# Patient Record
Sex: Female | Born: 1985 | Race: Black or African American | Hispanic: No | Marital: Single
Health system: Southern US, Community
[De-identification: ages and names within clinical notes are randomized; demographics above are authoritative.]

## PROBLEM LIST (undated history)

## (undated) ENCOUNTER — Inpatient Hospital Stay (HOSPITAL_COMMUNITY): Payer: Self-pay

## (undated) ENCOUNTER — Ambulatory Visit

## (undated) ENCOUNTER — Ambulatory Visit: Admission: EM | Payer: Medicare Other | Source: Home / Self Care

## (undated) DIAGNOSIS — F419 Anxiety disorder, unspecified: Secondary | ICD-10-CM

## (undated) DIAGNOSIS — F1020 Alcohol dependence, uncomplicated: Secondary | ICD-10-CM

## (undated) DIAGNOSIS — G8929 Other chronic pain: Secondary | ICD-10-CM

## (undated) DIAGNOSIS — F1910 Other psychoactive substance abuse, uncomplicated: Secondary | ICD-10-CM

## (undated) DIAGNOSIS — D649 Anemia, unspecified: Secondary | ICD-10-CM

## (undated) DIAGNOSIS — Z5189 Encounter for other specified aftercare: Secondary | ICD-10-CM

## (undated) DIAGNOSIS — F319 Bipolar disorder, unspecified: Secondary | ICD-10-CM

## (undated) DIAGNOSIS — F32A Depression, unspecified: Secondary | ICD-10-CM

## (undated) DIAGNOSIS — R519 Headache, unspecified: Secondary | ICD-10-CM

## (undated) DIAGNOSIS — F329 Major depressive disorder, single episode, unspecified: Secondary | ICD-10-CM

## (undated) DIAGNOSIS — J45909 Unspecified asthma, uncomplicated: Secondary | ICD-10-CM

## (undated) HISTORY — DX: Other chronic pain: G89.29

## (undated) HISTORY — PX: HAND SURGERY: SHX662

## (undated) HISTORY — DX: Unspecified asthma, uncomplicated: J45.909

## (undated) HISTORY — PX: COLONOSCOPY: SHX174

## (undated) HISTORY — DX: Headache, unspecified: R51.9

## (undated) HISTORY — DX: Encounter for other specified aftercare: Z51.89

## (undated) HISTORY — DX: Depression, unspecified: F32.A

## (undated) HISTORY — DX: Other psychoactive substance abuse, uncomplicated: F19.10

## (undated) HISTORY — DX: Alcohol dependence, uncomplicated: F10.20

## (undated) HISTORY — DX: Anemia, unspecified: D64.9

## (undated) HISTORY — PX: DILATION AND CURETTAGE OF UTERUS: SHX78

## (undated) HISTORY — PX: WISDOM TOOTH EXTRACTION: SHX21

---

## 2019-10-19 ENCOUNTER — Other Ambulatory Visit: Payer: Self-pay

## 2019-10-19 ENCOUNTER — Encounter: Payer: Self-pay | Admitting: Family Medicine

## 2019-10-19 ENCOUNTER — Ambulatory Visit
Admission: EM | Admit: 2019-10-19 | Discharge: 2019-10-19 | Disposition: A | Discharge status: Home / Self Care | Payer: 59 | Attending: Family Medicine | Admitting: Family Medicine

## 2019-10-19 DIAGNOSIS — Z3202 Encounter for pregnancy test, result negative: Secondary | ICD-10-CM | POA: Diagnosis not present

## 2019-10-19 DIAGNOSIS — R8281 Pyuria: Secondary | ICD-10-CM

## 2019-10-19 DIAGNOSIS — B9689 Other specified bacterial agents as the cause of diseases classified elsewhere: Secondary | ICD-10-CM

## 2019-10-19 DIAGNOSIS — N760 Acute vaginitis: Secondary | ICD-10-CM | POA: Insufficient documentation

## 2019-10-19 HISTORY — DX: Anxiety disorder, unspecified: F41.9

## 2019-10-19 HISTORY — DX: Bipolar disorder, unspecified: F31.9

## 2019-10-19 HISTORY — DX: Major depressive disorder, single episode, unspecified: F32.9

## 2019-10-19 LAB — POCT URINALYSIS DIP (MANUAL ENTRY)
Bilirubin, UA: NEGATIVE
Glucose, UA: NEGATIVE mg/dL
Ketones, POC UA: NEGATIVE mg/dL
Nitrite, UA: NEGATIVE
Spec Grav, UA: 1.02 (ref 1.010–1.025)
Urobilinogen, UA: 0.2 E.U./dL
pH, UA: 7 (ref 5.0–8.0)

## 2019-10-19 LAB — POCT URINE PREGNANCY: Preg Test, Ur: NEGATIVE

## 2019-10-19 MED ORDER — AZITHROMYCIN 500 MG PO TABS
1000.0000 mg | ORAL_TABLET | Freq: Once | ORAL | Status: AC
  Administered 2019-10-19: 1000 mg via ORAL

## 2019-10-19 MED ORDER — CEPHALEXIN 500 MG PO CAPS: 500.0000 mg | ORAL_CAPSULE | Freq: Four times a day (QID) | ORAL | 0 refills | Status: DC

## 2019-10-19 MED ORDER — FLUCONAZOLE 150 MG PO TABS: 150.0000 mg | ORAL_TABLET | Freq: Once | ORAL | 0 refills | Status: AC

## 2019-10-19 NOTE — ED Provider Notes (Signed)
[name]    CSN: [id number] Arrival date & time: 10/19/19  1439      History   Chief Complaint Chief Complaint  Patient presents with  . Vaginal Itching    HPI Kimberly Harper is a 34 y.o. female.   34 yo woman making initial [facility] visit.  She is complaining of vaginal irritation.  Pt presents to [facility] for assessment of redness, itching and profuse watery discharge since Thursday.    Patient states unsure of risk for STDs.  Patient states after intercourse a few weeks ago she began to spot, and spotted for almost a week.  This did not occur at the time of her normal period.    No fever, abdominal pain, vaginal odor.     Past Medical History:  Diagnosis Date  . Anxiety   . Bipolar 1 disorder ([place])   . Major depressive disorder     There are no problems to display for this patient.   Past Surgical History:  Procedure Laterality Date  . CESAREAN SECTION    . DILATION AND CURETTAGE OF UTERUS      OB History   No obstetric history on file.      Home Medications    Prior to Admission medications   Medication Sig Start Date End Date Taking? Authorizing Provider  cephALEXin (KEFLEX) 500 MG capsule Take 1 capsule (500 mg total) by mouth 4 (four) times daily. 10/19/19   Robyn Haber, MD  fluconazole (DIFLUCAN) 150 MG tablet Take 1 tablet (150 mg total) by mouth once for 1 dose. Repeat if needed 10/19/19 10/19/19  Robyn Haber, MD    Family History Family History  Problem Relation Age of Onset  . Hypertension Mother   . Diabetes Mother   . Stroke Mother     Social History Social History   Tobacco Use  . Smoking status: Never Smoker  . Smokeless tobacco: Never Used  Substance Use Topics  . Alcohol use: Yes    Comment: socially  . Drug use: Never     Allergies   Patient has no allergy information on record.   Review of Systems Review of Systems  Genitourinary: Positive for menstrual problem and vaginal pain.  All other  systems reviewed and are negative.    Physical Exam Triage Vital Signs ED Triage Vitals  Enc Vitals Group     BP      Pulse      Resp      Temp      Temp src      SpO2      Weight      Height      Head Circumference      Peak Flow      Pain Score      Pain Loc      Pain Edu?      Excl. in [place]?    No data found.  Updated Vital Signs BP 110/70 (BP Location: Left Arm)   Pulse 71   Temp 97.8 F (36.6 C) (Temporal)   Resp 16   LMP 10/06/2019   SpO2 95%    Physical Exam Vitals and nursing note reviewed.  Constitutional:      General: She is not in acute distress.    Appearance: Normal appearance. She is normal weight. She is not ill-appearing or toxic-appearing.  HENT:     Head: Normocephalic.  Eyes:     Conjunctiva/sclera: Conjunctivae normal.  Cardiovascular:     Rate  and Rhythm: Normal rate.  Pulmonary:     Effort: Pulmonary effort is normal.  Abdominal:     Palpations: Abdomen is soft.     Tenderness: There is no abdominal tenderness.  Musculoskeletal:        General: Normal range of motion.     Cervical back: Normal range of motion.  Skin:    General: Skin is warm and dry.  Neurological:     General: No focal deficit present.     Mental Status: She is alert and oriented to person, place, and time.  Psychiatric:        Mood and Affect: Mood normal.        Behavior: Behavior normal.        Thought Content: Thought content normal.        Judgment: Judgment normal.      UC Treatments / Results  Labs (all labs ordered are listed, but only abnormal results are displayed) Labs Reviewed  POCT URINALYSIS DIP (MANUAL ENTRY) - Abnormal; Notable for the following components:      Result Value   Blood, UA trace-intact (*)    Protein Ur, POC trace (*)    Leukocytes, UA Large (3+) (*)    All other components within normal limits  POCT URINE PREGNANCY - Normal  URINE CULTURE  CERVICOVAGINAL ANCILLARY ONLY    EKG   Radiology No results  found.  Procedures Procedures (including critical care time)  Medications Ordered in UC Medications  azithromycin (ZITHROMAX) tablet 1,000 mg (1,000 mg Oral Given 10/19/19 1514)    Initial Impression / Assessment and Plan / UC Course  I have reviewed the triage vital signs and the nursing notes.  Pertinent labs & imaging results that were available during my care of the patient were reviewed by me and considered in my medical decision making (see chart for details).    Final Clinical Impressions(s) / UC Diagnoses   Final diagnoses:  Vaginitis and vulvovaginitis  Pyuria     Discharge Instructions     We are running an STD screen and starting you on broad spectrum antibiotics.  You appear to have a urinary tract infection as well.   We'll know exactly the cause of your symptoms tomorrow, but want to make sure we get the infection under control today.    ED Prescriptions    Medication Sig Dispense Auth. Provider   cephALEXin (KEFLEX) 500 MG capsule Take 1 capsule (500 mg total) by mouth 4 (four) times daily. 20 capsule Elvina Sidle, MD   fluconazole (DIFLUCAN) 150 MG tablet Take 1 tablet (150 mg total) by mouth once for 1 dose. Repeat if needed 2 tablet Elvina Sidle, MD     I have reviewed the PDMP during this encounter.   Elvina Sidle, MD 10/19/19 1517

## 2019-10-19 NOTE — ED Notes (Signed)
Patient able to ambulate independently  

## 2019-10-19 NOTE — ED Triage Notes (Addendum)
Pt presents to [facility] for assessment of vaginal redness, itching and discharge since Thursday.  Patient states unsure of risk for STDs.  Patient states after intercourse a few weeks ago she began to spot, and spotted for almost a week.  This did not occur at the time of her normal period.

## 2019-10-19 NOTE — Discharge Instructions (Addendum)
We are running an STD screen and starting you on broad spectrum antibiotics.  You appear to have a urinary tract infection as well.   We'll know exactly the cause of your symptoms tomorrow, but want to make sure we get the infection under control today.

## 2019-10-21 LAB — URINE CULTURE: Culture: 40000 — AB

## 2019-10-22 ENCOUNTER — Telehealth (HOSPITAL_COMMUNITY): Payer: Self-pay

## 2019-10-22 LAB — CERVICOVAGINAL ANCILLARY ONLY
Bacterial vaginitis: NEGATIVE
Candida vaginitis: POSITIVE — AB
Chlamydia: NEGATIVE
Neisseria Gonorrhea: NEGATIVE
Trichomonas: POSITIVE — AB

## 2019-10-22 MED ORDER — METRONIDAZOLE 500 MG PO TABS: 500.0000 mg | ORAL_TABLET | Freq: Two times a day (BID) | ORAL | 0 refills | Status: DC

## 2019-12-02 ENCOUNTER — Ambulatory Visit
Admission: EM | Admit: 2019-12-02 | Discharge: 2019-12-02 | Disposition: A | Discharge status: Home / Self Care | Payer: 59 | Attending: Physician Assistant | Admitting: Physician Assistant

## 2019-12-02 DIAGNOSIS — N898 Other specified noninflammatory disorders of vagina: Secondary | ICD-10-CM | POA: Diagnosis present

## 2019-12-02 NOTE — Discharge Instructions (Signed)
You can take over the counter probiotics for the vaginal area to help with pH control. Cytology sent, you will be contacted with any positive results that requires further treatment. Refrain from sexual activity for the next 7 days. Monitor for any worsening of symptoms, fever, abdominal pain, nausea, vomiting, to follow up for reevaluation.

## 2019-12-02 NOTE — ED Triage Notes (Signed)
Pt c/o white cloudy vaginal discharge with an odor for over 2wks. States was tx'd for trich and yeast a month ago, states never really went away.

## 2019-12-02 NOTE — ED Provider Notes (Signed)
[name]    CSN: [id number] Arrival date & time: 12/02/19  1712      History   Chief Complaint Chief Complaint  Patient presents with  . Vaginal Discharge    HPI Kimberly Harper is a 34 y.o. female.   34 year old female comes in for few week history of vaginal discharge.  She was first seen 10/19/2019, and at that time was given Keflex and Diflucan.  Cytology tested positive for trichomonas, and started on metronidazole, she took this twice daily x7 days.  States got her cycle after this, and thought that vaginal discharge may have improved.  However, after cycle, no having vaginal discharge with odor.  States vaginal itching that was present during prior visit has resolved.  Denies urinary symptoms.  Denies fever.  Denies abdominal pain, nausea, vomiting.  Has not been sexually active since last visit.  LMP 11/13/2019.     Past Medical History:  Diagnosis Date  . Anxiety   . Bipolar 1 disorder ([place])   . Major depressive disorder     There are no problems to display for this patient.   Past Surgical History:  Procedure Laterality Date  . CESAREAN SECTION    . DILATION AND CURETTAGE OF UTERUS      OB History   No obstetric history on file.      Home Medications    Prior to Admission medications   Not on File    Family History Family History  Problem Relation Age of Onset  . Hypertension Mother   . Diabetes Mother   . Stroke Mother     Social History Social History   Tobacco Use  . Smoking status: Never Smoker  . Smokeless tobacco: Never Used  Substance Use Topics  . Alcohol use: Yes    Comment: socially  . Drug use: Never     Allergies   Patient has no known allergies.   Review of Systems Review of Systems  Reason unable to perform ROS: See HPI as above.     Physical Exam Triage Vital Signs ED Triage Vitals [12/02/19 1739]  Enc Vitals Group     BP 130/84     Pulse Rate 82     Resp 16     Temp 99.1 F (37.3  C)     Temp Source Oral     SpO2 97 %     Weight      Height      Head Circumference      Peak Flow      Pain Score 0     Pain Loc      Pain Edu?      Excl. in [place]?    No data found.  Updated Vital Signs BP 130/84 (BP Location: Left Arm)   Pulse 82   Temp 99.1 F (37.3 C) (Oral)   Resp 16   LMP 11/13/2019   SpO2 97%   Physical Exam Constitutional:      General: She is not in acute distress.    Appearance: Normal appearance. She is well-developed. She is not toxic-appearing or diaphoretic.  HENT:     Head: Normocephalic and atraumatic.  Eyes:     Conjunctiva/sclera: Conjunctivae normal.     Pupils: Pupils are equal, round, and reactive to light.  Pulmonary:     Effort: Pulmonary effort is normal. No respiratory distress.     Comments: Speaking in full sentences without difficulty Abdominal:     General: Bowel sounds  are normal.     Palpations: Abdomen is soft.     Tenderness: There is no abdominal tenderness. There is no guarding or rebound.  Musculoskeletal:     Cervical back: Normal range of motion and neck supple.  Skin:    General: Skin is warm and dry.  Neurological:     Mental Status: She is alert and oriented to person, place, and time.      UC Treatments / Results  Labs (all labs ordered are listed, but only abnormal results are displayed) Labs Reviewed  CERVICOVAGINAL ANCILLARY ONLY    EKG   Radiology No results found.  Procedures Procedures (including critical care time)  Medications Ordered in UC Medications - No data to display  Initial Impression / Assessment and Plan / UC Course  I have reviewed the triage vital signs and the nursing notes.  Pertinent labs & imaging results that were available during my care of the patient were reviewed by me and considered in my medical decision making (see chart for details).    Discussed possibility of bacterial vaginitis causing symptoms given vaginal odor.  Patient would like to await  cytology prior to treatment.  Cytology sent.  Discussed can try over-the-counter probiotics for symptoms.  Return precautions given.  Final Clinical Impressions(s) / UC Diagnoses   Final diagnoses:  Vaginal discharge    ED Prescriptions    None     PDMP not reviewed this encounter.   Belinda Fisher, PA-C 12/02/19 1919

## 2019-12-04 ENCOUNTER — Telehealth (HOSPITAL_COMMUNITY): Payer: Self-pay

## 2019-12-04 LAB — CERVICOVAGINAL ANCILLARY ONLY
Bacterial Vaginitis (gardnerella): POSITIVE — AB
Candida Glabrata: NEGATIVE
Candida Vaginitis: NEGATIVE
Chlamydia: NEGATIVE
Comment: NEGATIVE
Comment: NEGATIVE
Comment: NEGATIVE
Comment: NEGATIVE
Comment: NEGATIVE
Comment: NORMAL
Neisseria Gonorrhea: NEGATIVE
Trichomonas: NEGATIVE

## 2019-12-04 MED ORDER — METRONIDAZOLE 500 MG PO TABS: 500.0000 mg | ORAL_TABLET | Freq: Two times a day (BID) | ORAL | 0 refills | Status: DC

## 2019-12-06 ENCOUNTER — Inpatient Hospital Stay (HOSPITAL_COMMUNITY)
Admission: RE | Admit: 2019-12-06 | Discharge: 2019-12-12 | DRG: 885 | Disposition: A | Discharge status: Home / Self Care | Payer: 59 | Attending: Psychiatry | Admitting: Psychiatry

## 2019-12-06 ENCOUNTER — Other Ambulatory Visit: Payer: Self-pay

## 2019-12-06 ENCOUNTER — Encounter (HOSPITAL_COMMUNITY): Payer: Self-pay | Admitting: Psychiatry

## 2019-12-06 DIAGNOSIS — G4700 Insomnia, unspecified: Secondary | ICD-10-CM | POA: Diagnosis present

## 2019-12-06 DIAGNOSIS — F603 Borderline personality disorder: Secondary | ICD-10-CM | POA: Diagnosis present

## 2019-12-06 DIAGNOSIS — F3181 Bipolar II disorder: Principal | ICD-10-CM | POA: Diagnosis present

## 2019-12-06 DIAGNOSIS — Z818 Family history of other mental and behavioral disorders: Secondary | ICD-10-CM | POA: Diagnosis not present

## 2019-12-06 DIAGNOSIS — R45851 Suicidal ideations: Secondary | ICD-10-CM | POA: Diagnosis present

## 2019-12-06 DIAGNOSIS — F419 Anxiety disorder, unspecified: Secondary | ICD-10-CM | POA: Diagnosis present

## 2019-12-06 DIAGNOSIS — Z20822 Contact with and (suspected) exposure to covid-19: Secondary | ICD-10-CM | POA: Diagnosis present

## 2019-12-06 DIAGNOSIS — F4310 Post-traumatic stress disorder, unspecified: Secondary | ICD-10-CM | POA: Diagnosis present

## 2019-12-06 DIAGNOSIS — Z79899 Other long term (current) drug therapy: Secondary | ICD-10-CM | POA: Diagnosis not present

## 2019-12-06 DIAGNOSIS — F410 Panic disorder [episodic paroxysmal anxiety] without agoraphobia: Secondary | ICD-10-CM | POA: Diagnosis present

## 2019-12-06 MED ORDER — ACETAMINOPHEN 325 MG PO TABS: 650.0000 mg | ORAL_TABLET | Freq: Four times a day (QID) | ORAL | Status: DC | PRN

## 2019-12-06 MED ORDER — ALUM & MAG HYDROXIDE-SIMETH 200-200-20 MG/5ML PO SUSP: 30.0000 mL | ORAL | Status: DC | PRN

## 2019-12-06 MED ORDER — HYDROXYZINE HCL 25 MG PO TABS
25.0000 mg | ORAL_TABLET | Freq: Three times a day (TID) | ORAL | Status: DC | PRN
  Administered 2019-12-07 (×2): 25 mg via ORAL
  Filled 2019-12-06 (×2): qty 1

## 2019-12-06 MED ORDER — MAGNESIUM HYDROXIDE 400 MG/5ML PO SUSP: 30.0000 mL | Freq: Every day | ORAL | Status: DC | PRN

## 2019-12-06 MED ORDER — TRAZODONE HCL 50 MG PO TABS
50.0000 mg | ORAL_TABLET | Freq: Every evening | ORAL | Status: DC | PRN
  Filled 2019-12-06: qty 1

## 2019-12-06 NOTE — H&P (Signed)
Behavioral Health Medical Screening Exam  Kimberly Harper is an 34 y.o. female.  Total Time spent with patient: 15 minutes  Psychiatric Specialty Exam: Physical Exam  Constitutional: She is oriented to person, place, and time. She appears well-developed and well-nourished. No distress.  HENT:  Head: Normocephalic and atraumatic.  Right Ear: External ear normal.  Left Ear: External ear normal.  Eyes: Pupils are equal, round, and reactive to light. Right eye exhibits no discharge. Left eye exhibits no discharge.  Respiratory: Effort normal. No respiratory distress.  Musculoskeletal:        General: Normal range of motion.  Neurological: She is alert and oriented to person, place, and time.  Skin: She is not diaphoretic.  Psychiatric: Her mood appears anxious. Thought content is not paranoid and not delusional. She exhibits a depressed mood. She expresses suicidal ideation. She expresses no homicidal ideation. She expresses suicidal plans.    Review of Systems  Respiratory: Negative for cough and shortness of breath.   Cardiovascular: Negative for chest pain and palpitations.  Gastrointestinal: Negative for diarrhea, nausea and vomiting.  Psychiatric/Behavioral: Positive for decreased concentration, dysphoric mood, sleep disturbance and suicidal ideas. The patient is nervous/anxious.   All other systems reviewed and are negative.   Blood pressure 128/82, pulse (!) 108, temperature 98.9 F (37.2 C), temperature source Oral, resp. rate 18, last menstrual period 11/13/2019, SpO2 100 %.There is no height or weight on file to calculate BMI.  General Appearance: Casual and Well Groomed  Eye Contact:  Fair  Speech:  Clear and Coherent and Normal Rate  Volume:  Decreased  Mood:  Anxious, Depressed, Hopeless and Worthless  Affect:  Congruent and Depressed  Thought Process:  Coherent, Goal Directed, Linear and Descriptions of Associations: Intact  Orientation:  Full (Time, Place, and  Person)  Thought Content:  Logical and Hallucinations: Reports hypnopompic hallucinations of abstract images.   Suicidal Thoughts:  Yes.  with intent/plan  Homicidal Thoughts:  No  Memory:  Immediate;   Good Recent;   Fair Remote;   Fair  Judgement:  Intact  Insight:  Lacking  Psychomotor Activity:  Normal  Concentration: Concentration: Fair and Attention Span: Fair  Recall:  Good  Fund of Knowledge:Good  Language: Good  Akathisia:  Negative  Handed:  Right  AIMS (if indicated):     Assets:  Communication Skills Desire for Improvement Financial Resources/Insurance Housing Leisure Time Physical Health  Sleep:       Musculoskeletal: Strength & Muscle Tone: within normal limits Gait & Station: normal Patient leans: N/A  Blood pressure 128/82, pulse (!) 108, temperature 98.9 F (37.2 C), temperature source Oral, resp. rate 18, last menstrual period 11/13/2019, SpO2 100 %.  Recommendations:  Based on my evaluation the patient does not appear to have an emergency medical condition.  Jackelyn Poling, NP 12/06/2019, 11:09 PM

## 2019-12-06 NOTE — Progress Notes (Signed)
34 year old female voluntarily walk-in, will be admitted to inpatient unit bed 302-2. She reports extreme anxiety and depression. She claims her mood is very up and down, erratic. Pt is a cutter, seen on arms and neck, superficial. Denies HI or AVH currently. Did endorse that sometimes when she wakes up she sees shapes. Endorses passive SI currently with no plan. Stated that she has very few people she actually feels she is "close" to. No current medical providers. A&Ox4, no complaints of pain. Logical and coherent. Pt given food and water. Safety maintained.

## 2019-12-06 NOTE — Plan of Care (Signed)
[facility] Crisis Plan  Reason for Crisis Plan:  Crisis Stabilization   Plan of Care:  Referral for Inpatient Hospitalization  Family Support:      Current Living Environment:  Living Arrangements: Alone  Insurance:   Hospital Account    Name Acct ID Class Status Primary Coverage   Kimberly Harper, Kimberly Harper [id number] [facility] Inpatient Special Open [facility]  - [employer]        Guarantor Account (for Hospital Account [id number])    Name Relation to Pt Service Area Active? Acct Type   Kimberly Harper Self [facility] Yes Behavioral Health   Address Phone       [address] [place], [place] [address] [phone](H)          Coverage Information (for Hospital Account [id number])    F/O Payor/Plan Precert #   [facility]    Subscriber Subscriber #   [place], [place] [id number]   Address Phone   [address] [address] [place], [place] [address]       Legal Guardian:  Legal Guardian: Other:(Self)  Primary Care Provider:  Patient, No Pcp Per  Current Outpatient Providers:  none  Psychiatrist:  Name of Psychiatrist: None  Counselor/Therapist:  Name of Therapist: None  Compliant with Medications:  No-none  Additional Information:   Joseph Berkshire 5/15/202110:38 PM
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
## 2019-12-07 DIAGNOSIS — F3181 Bipolar II disorder: Principal | ICD-10-CM

## 2019-12-07 LAB — CBC
HCT: 35.1 % — ABNORMAL LOW (ref 36.0–46.0)
Hemoglobin: 11.1 g/dL — ABNORMAL LOW (ref 12.0–15.0)
MCH: 25.9 pg — ABNORMAL LOW (ref 26.0–34.0)
MCHC: 31.6 g/dL (ref 30.0–36.0)
MCV: 81.8 fL (ref 80.0–100.0)
Platelets: 311 10*3/uL (ref 150–400)
RBC: 4.29 MIL/uL (ref 3.87–5.11)
RDW: 16 % — ABNORMAL HIGH (ref 11.5–15.5)
WBC: 9.5 10*3/uL (ref 4.0–10.5)
nRBC: 0 % (ref 0.0–0.2)

## 2019-12-07 LAB — COMPREHENSIVE METABOLIC PANEL
ALT: 17 U/L (ref 0–44)
AST: 20 U/L (ref 15–41)
Albumin: 4.2 g/dL (ref 3.5–5.0)
Alkaline Phosphatase: 53 U/L (ref 38–126)
Anion gap: 8 (ref 5–15)
BUN: 9 mg/dL (ref 6–20)
CO2: 25 mmol/L (ref 22–32)
Calcium: 9.1 mg/dL (ref 8.9–10.3)
Chloride: 106 mmol/L (ref 98–111)
Creatinine, Ser: 0.9 mg/dL (ref 0.44–1.00)
GFR calc Af Amer: >60 mL/min (ref >60)
GFR calc non Af Amer: >60 mL/min (ref >60)
Glucose, Bld: 141 mg/dL — ABNORMAL HIGH (ref 70–99)
Potassium: 3.3 mmol/L — ABNORMAL LOW (ref 3.5–5.1)
Sodium: 139 mmol/L (ref 135–145)
Total Bilirubin: 0.2 mg/dL — ABNORMAL LOW (ref 0.3–1.2)
Total Protein: 7.3 g/dL (ref 6.5–8.1)

## 2019-12-07 LAB — LIPID PANEL
Cholesterol: 189 mg/dL (ref 0–200)
HDL: 39 mg/dL — ABNORMAL LOW (ref >40)
LDL Cholesterol: 141 mg/dL — ABNORMAL HIGH (ref 0–99)
Total CHOL/HDL Ratio: 4.8 RATIO
Triglycerides: 47 mg/dL (ref <150)
VLDL: 9 mg/dL (ref 0–40)

## 2019-12-07 LAB — TSH: TSH: 0.646 u[IU]/mL (ref 0.350–4.500)

## 2019-12-07 LAB — SARS CORONAVIRUS 2 BY RT PCR (HOSPITAL ORDER, PERFORMED IN ~~LOC~~ HOSPITAL LAB): SARS Coronavirus 2: NEGATIVE

## 2019-12-07 MED ORDER — PRAZOSIN HCL 1 MG PO CAPS
1.0000 mg | ORAL_CAPSULE | Freq: Every day | ORAL | Status: DC
  Filled 2019-12-07 (×3): qty 1

## 2019-12-07 MED ORDER — METRONIDAZOLE 500 MG PO TABS
500.0000 mg | ORAL_TABLET | Freq: Two times a day (BID) | ORAL | Status: DC
  Administered 2019-12-07 – 2019-12-12 (×10): 500 mg via ORAL
  Filled 2019-12-07 (×15): qty 1
  Filled 2019-12-07: qty 2

## 2019-12-07 MED ORDER — BUSPIRONE HCL 5 MG PO TABS
5.0000 mg | ORAL_TABLET | Freq: Three times a day (TID) | ORAL | Status: DC
  Administered 2019-12-07 – 2019-12-12 (×16): 5 mg via ORAL
  Filled 2019-12-07 (×24): qty 1

## 2019-12-07 MED ORDER — GABAPENTIN 100 MG PO CAPS
200.0000 mg | ORAL_CAPSULE | Freq: Three times a day (TID) | ORAL | Status: DC
  Administered 2019-12-07 – 2019-12-09 (×8): 200 mg via ORAL
  Filled 2019-12-07 (×15): qty 2

## 2019-12-07 NOTE — [facility] Suicide Risk Assessment (Signed)
[facility] INPATIENT:  Family/Significant Other Suicide Prevention Education  Suicide Prevention Education:  Patient Refusal for Family/Significant Other Suicide Prevention Education: The patient [place] has refused to provide written consent for family/significant other to be provided Family/Significant Other Suicide Prevention Education during admission and/or prior to discharge.  Physician notified.   Suicide Prevention Education was reviewed thoroughly with patient, including risk factors, warning signs, and what to do.  Mobile Crisis services were described and that telephone number pointed out, with encouragement to patient to put this number in personal cell phone.  Brochure was provided to patient to share with natural supports.  Patient acknowledged the ways in which they are at risk, and how working through each of their issues can gradually start to reduce their risk factors.  Patient was encouraged to think of the information in the context of people in their own lives.  Patient denied having access to firearms  Patient verbalized understanding of information provided.  Patient was not yet able to endorse a desire to live.     Carloyn Jaeger Grossman-Orr 12/07/2019, 3:56 PM

## 2019-12-07 NOTE — Progress Notes (Signed)
[facility] NOVEL CORONAVIRUS (COVID-19) DAILY CHECK-OFF SYMPTOMS - answer yes or no to each - every day NO YES  Have you had a fever in the past 24 hours?  . Fever (Temp > 37.80C / 100F) X   Have you had any of these symptoms in the past 24 hours? . New Cough .  Sore Throat  .  Shortness of Breath .  Difficulty Breathing .  Unexplained Body Aches   X   Have you had any one of these symptoms in the past 24 hours not related to allergies?   . Runny Nose .  Nasal Congestion .  Sneezing   X   If you have had runny nose, nasal congestion, sneezing in the past 24 hours, has it worsened?  X   EXPOSURES - check yes or no X   Have you traveled outside the state in the past 14 days?  X   Have you been in contact with someone with a confirmed diagnosis of COVID-19 or PUI in the past 14 days without wearing appropriate PPE?  X   Have you been living in the same home as a person with confirmed diagnosis of COVID-19 or a PUI (household contact)?    X   Have you been diagnosed with COVID-19?    X              What to do next: Answered NO to all: Answered YES to anything:   Proceed with unit schedule Follow the [facility] Inpatient Flowsheet.   Georgina Krist K. Chayce Robbins MSN, RN, [facility] [phone] 
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
## 2019-12-07 NOTE — Tx Team (Signed)
Initial Treatment Plan 12/07/2019 2:20 AM Kimberly Harper HQR:[id number]    PATIENT STRESSORS: Financial difficulties Health problems Marital or family conflict Occupational concerns   PATIENT STRENGTHS: Active sense of humor Average or above average intelligence Capable of independent living Communication skills Work skills   PATIENT IDENTIFIED PROBLEMS: Depression  Anxiety  Suicidal Ideation      "Get a good therapist"  "Get right medication to feel balanced, not sick"         DISCHARGE CRITERIA:  Improved stabilization in mood, thinking, and/or behavior Motivation to continue treatment in a less acute level of care Need for constant or close observation no longer present Verbal commitment to aftercare and medication compliance  PRELIMINARY DISCHARGE PLAN: Outpatient therapy Return to previous living arrangement Return to previous work or school arrangements  PATIENT/FAMILY INVOLVEMENT: This treatment plan has been presented to and reviewed with the patient, Kimberly Harper.  The patient and family have been given the opportunity to ask questions and make suggestions.  Juliann Pares, RN 12/07/2019, 2:20 AM
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
## 2019-12-07 NOTE — Progress Notes (Addendum)
Pt transferred to 302 with Emiliano Dyer, report given.   Pt was irritable and refused skin assessment prior to going to other unit. RN informed. Belongings sent with patient.
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
## 2019-12-07 NOTE — Progress Notes (Signed)
   12/07/19 2136  Psych Admission Type (Psych Patients Only)  Admission Status Voluntary  Psychosocial Assessment  Patient Complaints Insomnia;Anxiety  Eye Contact Brief  Facial Expression Flat  Affect Appropriate to circumstance;Labile  Speech Logical/coherent  Interaction Assertive  Motor Activity Fidgety  Appearance/Hygiene Unremarkable  Behavior Characteristics Irritable  Mood Labile;Pleasant  Thought Process  Coherency WDL  Content WDL  Delusions None reported or observed  Perception WDL  Hallucination None reported or observed  Judgment Impaired  Confusion None  Danger to Self  Current suicidal ideation? Denies  Self-Injurious Behavior No self-injurious ideation or behavior indicators observed or expressed   Agreement Not to Harm Self Yes  Description of Agreement verbal  Danger to Others  Danger to Others None reported or observed

## 2019-12-07 NOTE — Progress Notes (Signed)
   12/07/19 1500  Psych Admission Type (Psych Patients Only)  Admission Status Voluntary  Psychosocial Assessment  Patient Complaints Agitation;Irritability;Insomnia;Sleep disturbance  Eye Contact Brief  Facial Expression Blank  Affect Anxious  Speech Logical/coherent  Interaction Assertive  Motor Activity Fidgety  Appearance/Hygiene Unremarkable  Behavior Characteristics Cooperative;Aggressive physically (shoved urine specimen  at nurse)  Thought Process  Coherency WDL  Content WDL  Delusions None reported or observed  Perception WDL  Hallucination None reported or observed  Judgment WDL  Confusion WDL  Danger to Self  Current suicidal ideation? Passive  Self-Injurious Behavior Some self-injurious ideation observed or expressed.  No lethal plan expressed   Agreement Not to Harm Self Yes  Danger to Others  Danger to Others None reported or observed    Emberlynn Riggan K. Melvyn Neth MSN, RN, [facility] [phone]

## 2019-12-07 NOTE — Progress Notes (Signed)
   12/07/19 2134  COVID-19 Daily Checkoff  Have you had a fever (temp > 37.80C/100F)  in the past 24 hours?  No  If you have had runny nose, nasal congestion, sneezing in the past 24 hours, has it worsened? No  COVID-19 EXPOSURE  Have you traveled outside the state in the past 14 days? No  Have you been in contact with someone with a confirmed diagnosis of COVID-19 or PUI in the past 14 days without wearing appropriate PPE? No  Have you been living in the same home as a person with confirmed diagnosis of COVID-19 or a PUI (household contact)? No  Have you been diagnosed with COVID-19? No

## 2019-12-08 LAB — RAPID URINE DRUG SCREEN, HOSP PERFORMED
Amphetamines: NOT DETECTED
Barbiturates: NOT DETECTED
Benzodiazepines: NOT DETECTED
Cocaine: NOT DETECTED
Opiates: NOT DETECTED
Tetrahydrocannabinol: NOT DETECTED

## 2019-12-08 LAB — PREGNANCY, URINE: Preg Test, Ur: NEGATIVE

## 2019-12-08 MED ORDER — MIRTAZAPINE 7.5 MG PO TABS
7.5000 mg | ORAL_TABLET | Freq: Every day | ORAL | Status: DC
  Filled 2019-12-08 (×4): qty 1

## 2019-12-08 NOTE — Tx Team (Signed)
Interdisciplinary Treatment and Diagnostic Plan Update  12/08/2019 Time of Session: 9:10am Kimberly Harper MRN: [id number]  Principal Diagnosis: <principal problem not specified>  Secondary Diagnoses: Active Problems:   Bipolar 2 disorder, major depressive episode ([place])   Current Medications:  Current Facility-Administered Medications  Medication Dose Route Frequency Provider Last Rate Last Admin  . acetaminophen (TYLENOL) tablet 650 mg  650 mg Oral Q6H PRN Lindon Romp A, NP      . alum & mag hydroxide-simeth (MAALOX/MYLANTA) 200-200-20 MG/5ML suspension 30 mL  30 mL Oral Q4H PRN Lindon Romp A, NP      . busPIRone (BUSPAR) tablet 5 mg  5 mg Oral TID Sharma Covert, MD   5 mg at 12/08/19 0900  . gabapentin (NEURONTIN) capsule 200 mg  200 mg Oral TID Sharma Covert, MD   200 mg at 12/08/19 0901  . hydrOXYzine (ATARAX/VISTARIL) tablet 25 mg  25 mg Oral TID PRN Rozetta Nunnery, NP   25 mg at 12/07/19 2105  . magnesium hydroxide (MILK OF MAGNESIA) suspension 30 mL  30 mL Oral Daily PRN Lindon Romp A, NP      . metroNIDAZOLE (FLAGYL) tablet 500 mg  500 mg Oral Q12H Sharma Covert, MD   500 mg at 12/08/19 0901  . prazosin (MINIPRESS) capsule 1 mg  1 mg Oral QHS Sharma Covert, MD      . traZODone (DESYREL) tablet 50 mg  50 mg Oral QHS PRN Rozetta Nunnery, NP       PTA Medications: Medications Prior to Admission  Medication Sig Dispense Refill Last Dose  . aspirin-acetaminophen-caffeine (EXCEDRIN MIGRAINE) 250-250-65 MG tablet Take by mouth every 6 (six) hours as needed for headache.     . busPIRone (BUSPAR) 10 MG tablet Take 10 mg by mouth 3 (three) times daily.     . citalopram (CELEXA) 20 MG tablet Take 20 mg by mouth daily.     . clotrimazole-betamethasone (LOTRISONE) cream Apply 1 application topically 2 (two) times daily. Twice daily to affected area for 7 days     . gabapentin (NEURONTIN) 300 MG capsule Take 300 mg by mouth 3 (three) times daily.     .  metroNIDAZOLE (FLAGYL) 500 MG tablet Take 1 tablet (500 mg total) by mouth 2 (two) times daily. 14 tablet 0 12/06/2019 at Unknown time  . mirtazapine (REMERON) 45 MG tablet Take 45 mg by mouth at bedtime.     . propranolol (INDERAL) 20 MG tablet Take 20 mg by mouth 3 (three) times daily as needed (for anxiety/migraine).     . QUEtiapine (SEROQUEL) 50 MG tablet Take 50 mg by mouth at bedtime.     . topiramate (TOPAMAX) 25 MG tablet Take 50 mg by mouth 2 (two) times daily.       Patient Stressors: Financial difficulties Health problems Marital or family conflict Occupational concerns  Patient Strengths: Active sense of humor Average or above average intelligence Capable of independent living Communication skills Work skills  Treatment Modalities: Medication Management, Group therapy, Case management,  1 to 1 session with clinician, Psychoeducation, Recreational therapy.   Physician Treatment Plan for Primary Diagnosis: <principal problem not specified> Long Term Goal(s): Improvement in symptoms so as ready for discharge Improvement in symptoms so as ready for discharge   Short Term Goals: Ability to identify changes in lifestyle to reduce recurrence of condition will improve Ability to verbalize feelings will improve Ability to disclose and discuss suicidal ideas Ability to demonstrate self-control will  improve Ability to identify and develop effective coping behaviors will improve Ability to maintain clinical measurements within normal limits will improve Compliance with prescribed medications will improve Ability to identify triggers associated with substance abuse/mental health issues will improve Ability to identify changes in lifestyle to reduce recurrence of condition will improve Ability to verbalize feelings will improve Ability to disclose and discuss suicidal ideas Ability to demonstrate self-control will improve Ability to identify and develop effective coping behaviors  will improve Ability to maintain clinical measurements within normal limits will improve Compliance with prescribed medications will improve Ability to identify triggers associated with substance abuse/mental health issues will improve  Medication Management: Evaluate patient's response, side effects, and tolerance of medication regimen.  Therapeutic Interventions: 1 to 1 sessions, Unit Group sessions and Medication administration.  Evaluation of Outcomes: Not Met  Physician Treatment Plan for Secondary Diagnosis: Active Problems:   Bipolar 2 disorder, major depressive episode ([place])  Long Term Goal(s): Improvement in symptoms so as ready for discharge Improvement in symptoms so as ready for discharge   Short Term Goals: Ability to identify changes in lifestyle to reduce recurrence of condition will improve Ability to verbalize feelings will improve Ability to disclose and discuss suicidal ideas Ability to demonstrate self-control will improve Ability to identify and develop effective coping behaviors will improve Ability to maintain clinical measurements within normal limits will improve Compliance with prescribed medications will improve Ability to identify triggers associated with substance abuse/mental health issues will improve Ability to identify changes in lifestyle to reduce recurrence of condition will improve Ability to verbalize feelings will improve Ability to disclose and discuss suicidal ideas Ability to demonstrate self-control will improve Ability to identify and develop effective coping behaviors will improve Ability to maintain clinical measurements within normal limits will improve Compliance with prescribed medications will improve Ability to identify triggers associated with substance abuse/mental health issues will improve     Medication Management: Evaluate patient's response, side effects, and tolerance of medication regimen.  Therapeutic Interventions: 1 to  1 sessions, Unit Group sessions and Medication administration.  Evaluation of Outcomes: Not Met   RN Treatment Plan for Primary Diagnosis: <principal problem not specified> Long Term Goal(s): Knowledge of disease and therapeutic regimen to maintain health will improve  Short Term Goals: Ability to participate in decision making will improve, Ability to verbalize feelings will improve, Ability to disclose and discuss suicidal ideas, Ability to identify and develop effective coping behaviors will improve and Compliance with prescribed medications will improve  Medication Management: RN will administer medications as ordered by provider, will assess and evaluate patient's response and provide education to patient for prescribed medication. RN will report any adverse and/or side effects to prescribing provider.  Therapeutic Interventions: 1 on 1 counseling sessions, Psychoeducation, Medication administration, Evaluate responses to treatment, Monitor vital signs and CBGs as ordered, Perform/monitor CIWA, COWS, AIMS and Fall Risk screenings as ordered, Perform wound care treatments as ordered.  Evaluation of Outcomes: Not Met   LCSW Treatment Plan for Primary Diagnosis: <principal problem not specified> Long Term Goal(s): Safe transition to appropriate next level of care at discharge, Engage patient in therapeutic group addressing interpersonal concerns.  Short Term Goals: Engage patient in aftercare planning with referrals and resources  Therapeutic Interventions: Assess for all discharge needs, 1 to 1 time with Social worker, Explore available resources and support systems, Assess for adequacy in community support network, Educate family and significant other(s) on suicide prevention, Complete Psychosocial Assessment, Interpersonal group therapy.  Evaluation of  Outcomes: Not Met   Progress in Treatment: Attending groups: No. Participating in groups: No. Taking medication as prescribed:  Yes. Toleration medication: Yes. Family/Significant other contact made: No, will contact:  patient declined consent for collateral contacts Patient understands diagnosis: Yes. Discussing patient identified problems/goals with staff: Yes. Medical problems stabilized or resolved: Yes. Denies suicidal/homicidal ideation: Yes. Issues/concerns per patient self-inventory: No. Other:   New problem(s) identified: None   New Short Term/Long Term Goal(s): medication stabilization, elimination of SI thoughts, development of comprehensive mental wellness plan.    Patient Goals: "I want a therapist, a psychiatrist, whatever I told the lady yesterday"     Discharge Plan or Barriers: Patient recently admitted. CSW will continue to follow and assess for appropriate referrals and possible discharge planning.    Reason for Continuation of Hospitalization: Anxiety Depression Medication stabilization Suicidal ideation  Estimated Length of Stay: 3-5 days   Attendees: Patient: Kimberly Harper  12/08/2019 10:55 AM  Physician:  12/08/2019 10:55 AM  Nursing:  12/08/2019 10:55 AM  RN Care Manager: 12/08/2019 10:55 AM  Social Worker: Radonna Ricker, LCSW 12/08/2019 10:55 AM  Recreational Therapist:  12/08/2019 10:55 AM  Other: Harriett Sine, NP 12/08/2019 10:55 AM  Other:  12/08/2019 10:55 AM  Other: 12/08/2019 10:55 AM    Scribe for Treatment Team: Marylee Floras, [place] 12/08/2019 10:55 AM

## 2019-12-08 NOTE — [facility] Group Notes (Signed)
LCSW Group Therapy Note 12/08/2019 2:21 PM  Type of Therapy and Topic: Group Therapy: Overcoming Obstacles  Participation Level: Did Not Attend  Description of Group:  In this group patients will be encouraged to explore what they see as obstacles to their own wellness and recovery. They will be guided to discuss their thoughts, feelings, and behaviors related to these obstacles. The group will process together ways to cope with barriers, with attention given to specific choices patients can make. Each patient will be challenged to identify changes they are motivated to make in order to overcome their obstacles. This group will be process-oriented, with patients participating in exploration of their own experiences as well as giving and receiving support and challenge from other group members.  Therapeutic Goals: 1. Patient will identify personal and current obstacles as they relate to admission. 2. Patient will identify barriers that currently interfere with their wellness or overcoming obstacles.  3. Patient will identify feelings, thought process and behaviors related to these barriers. 4. Patient will identify two changes they are willing to make to overcome these obstacles:   Summary of Patient Progress  Invited, chose not to attend.    Therapeutic Modalities:  Cognitive Behavioral Therapy Solution Focused Therapy Motivational Interviewing Relapse Prevention Therapy   Alcario Drought Clinical Social Worker

## 2019-12-08 NOTE — Progress Notes (Addendum)
[facility] MD Progress Note  12/08/2019 11:41 AM Kimberly Harper  MRN:  [id number] Subjective:  "I'm tired."  Kimberly Harper found lying in bed. She is significantly irritable on assessment today and states she does not want to talk. She complains of fatigue and irritable mood. She reports sleep was fair overnight. 6 hours of sleep recorded. She was started on gabapentin yesterday but denies history of fatigue when taking gabapentin in the past. She is unable to identify stressors for hospitalization other than "being off my medications." She states that she has taken more than 20 medications for her mental health with no improvement. She does complain of difficulty sleeping at home and states Remeron was helpful in the past. She denies SI/HI/AVH. She denies other symptoms of mania besides irritability. She denies history of nightmares and is requesting that Minipress be discontinued. She has been isolating to her room.   From admission H&P: Patient is a 34 year old female with a probable past psychiatric history significant for anxiety, depression, posttraumatic stress disorder and probable borderline personality disorder who presented to the [facility] as a direct evaluation. The patient stated that she has been having mood instability recently, and describes laughing at 1 moment crying the next.  Principal Problem: <principal problem not specified> Diagnosis: Active Problems:   Bipolar 2 disorder, major depressive episode (HCC)  Total Time spent with patient: 15 minutes  Past Psychiatric History: See admission H&P  Past Medical History:  Past Medical History:  Diagnosis Date  . Anxiety   . Bipolar 1 disorder (HCC)   . Major depressive disorder     Past Surgical History:  Procedure Laterality Date  . CESAREAN SECTION    . DILATION AND CURETTAGE OF UTERUS     Family History:  Family History  Problem Relation Age of Onset  . Hypertension Mother   . Diabetes  Mother   . Stroke Mother    Family Psychiatric  History: See admission H&P Social History:  Social History   Substance and Sexual Activity  Alcohol Use Yes   Comment: socially     Social History   Substance and Sexual Activity  Drug Use Never    Social History   Socioeconomic History  . Marital status: Single    Spouse name: Not on file  . Number of children: Not on file  . Years of education: Not on file  . Highest education level: Not on file  Occupational History  . Not on file  Tobacco Use  . Smoking status: Never Smoker  . Smokeless tobacco: Never Used  Substance and Sexual Activity  . Alcohol use: Yes    Comment: socially  . Drug use: Never  . Sexual activity: Not Currently    Birth control/protection: Abstinence  Other Topics Concern  . Not on file  Social History Narrative  . Not on file   Social Determinants of Health   Financial Resource Strain:   . Difficulty of Paying Living Expenses:   Food Insecurity:   . Worried About Programme researcher, broadcasting/film/video in the Last Year:   . Barista in the Last Year:   Transportation Needs:   . Freight forwarder (Medical):   Marland Kitchen Lack of Transportation (Non-Medical):   Physical Activity:   . Days of Exercise per Week:   . Minutes of Exercise per Session:   Stress:   . Feeling of Stress :   Social Connections:   . Frequency of Communication with Friends  and Family:   . Frequency of Social Gatherings with Friends and Family:   . Attends Religious Services:   . Active Member of Clubs or Organizations:   . Attends Banker Meetings:   Marland Kitchen Marital Status:    Additional Social History:    Pain Medications: Denies abuse Prescriptions: Denies abuse Over the Counter: Denies abuse History of alcohol / drug use?: No history of alcohol / drug abuse Longest period of sobriety (when/how long): NA                    Sleep: Good  Appetite:  Fair  Current Medications: Current Facility-Administered  Medications  Medication Dose Route Frequency Provider Last Rate Last Admin  . acetaminophen (TYLENOL) tablet 650 mg  650 mg Oral Q6H PRN Nira Conn A, NP      . alum & mag hydroxide-simeth (MAALOX/MYLANTA) 200-200-20 MG/5ML suspension 30 mL  30 mL Oral Q4H PRN Nira Conn A, NP      . busPIRone (BUSPAR) tablet 5 mg  5 mg Oral TID Antonieta Pert, MD   5 mg at 12/08/19 0900  . gabapentin (NEURONTIN) capsule 200 mg  200 mg Oral TID Antonieta Pert, MD   200 mg at 12/08/19 0901  . hydrOXYzine (ATARAX/VISTARIL) tablet 25 mg  25 mg Oral TID PRN Jackelyn Poling, NP   25 mg at 12/07/19 2105  . magnesium hydroxide (MILK OF MAGNESIA) suspension 30 mL  30 mL Oral Daily PRN Nira Conn A, NP      . metroNIDAZOLE (FLAGYL) tablet 500 mg  500 mg Oral Q12H Antonieta Pert, MD   500 mg at 12/08/19 0901  . mirtazapine (REMERON) tablet 7.5 mg  7.5 mg Oral QHS Aldean Baker, NP      . traZODone (DESYREL) tablet 50 mg  50 mg Oral QHS PRN Jackelyn Poling, NP        Lab Results:  Results for orders placed or performed during the hospital encounter of 12/06/19 (from the past 48 hour(s))  SARS Coronavirus 2 by RT PCR (hospital order, performed in [facility] hospital lab) Nasopharyngeal Nasopharyngeal Swab     Status: None   Collection Time: 12/06/19  9:44 PM   Specimen: Nasopharyngeal Swab  Result Value Ref Range   SARS Coronavirus 2 NEGATIVE NEGATIVE    Comment: (NOTE) SARS-CoV-2 target nucleic acids are NOT DETECTED. The SARS-CoV-2 RNA is generally detectable in upper and lower respiratory specimens during the acute phase of infection. The lowest concentration of SARS-CoV-2 viral copies this assay can detect is 250 copies / mL. A negative result does not preclude SARS-CoV-2 infection and should not be used as the sole basis for treatment or other patient management decisions.  A negative result may occur with improper specimen collection / handling, submission of specimen other than  nasopharyngeal swab, presence of viral mutation(s) within the areas targeted by this assay, and inadequate number of viral copies (<250 copies / mL). A negative result must be combined with clinical observations, patient history, and epidemiological information. Fact Sheet for Patients:   BoilerBrush.com.cy Fact Sheet for Healthcare Providers: https://pope.com/ This test is not yet approved or cleared  by the [place] FDA and has been authorized for detection and/or diagnosis of SARS-CoV-2 by FDA under an Emergency Use Authorization (EUA).  This EUA will remain in effect (meaning this test can be used) for the duration of the COVID-19 declaration under Section 564(b)(1) of the Act, 21 U.S.C. section  360bbb-3(b)(1), unless the authorization is terminated or revoked sooner. Performed at [facility], [address] [place], [place] [address]   Pregnancy, urine     Status: None   Collection Time: 12/07/19  8:15 AM  Result Value Ref Range   Preg Test, Ur NEGATIVE NEGATIVE    Comment: Performed at [facility], [address] [place], [place] [address]  Urine rapid drug screen (hosp performed)not at [facility]     Status: None   Collection Time: 12/07/19  8:15 AM  Result Value Ref Range   Opiates NONE DETECTED NONE DETECTED   Cocaine NONE DETECTED NONE DETECTED   Benzodiazepines NONE DETECTED NONE DETECTED   Amphetamines NONE DETECTED NONE DETECTED   Tetrahydrocannabinol NONE DETECTED NONE DETECTED   Barbiturates NONE DETECTED NONE DETECTED    Comment: (NOTE) DRUG SCREEN FOR MEDICAL PURPOSES ONLY.  IF CONFIRMATION IS NEEDED FOR ANY PURPOSE, NOTIFY LAB WITHIN 5 DAYS. LOWEST DETECTABLE LIMITS FOR URINE DRUG SCREEN Drug Class                     Cutoff (ng/mL) Amphetamine and metabolites    1000 Barbiturate and metabolites    200 Benzodiazepine                 200 Tricyclics and metabolites      300 Opiates and metabolites        300 Cocaine and metabolites        300 THC                            50 Performed at [facility], [address] [place], [place] [address]   CBC     Status: Abnormal   Collection Time: 12/07/19  6:22 PM  Result Value Ref Range   WBC 9.5 4.0 - 10.5 K/uL   RBC 4.29 3.87 - 5.11 MIL/uL   Hemoglobin 11.1 (L) 12.0 - 15.0 g/dL   HCT 50.5 (L) 39.7 - 67.3 %   MCV 81.8 80.0 - 100.0 fL   MCH 25.9 (L) 26.0 - 34.0 pg   MCHC 31.6 30.0 - 36.0 g/dL   RDW 41.9 (H) 37.9 - 02.4 %   Platelets 311 150 - 400 K/uL   nRBC 0.0 0.0 - 0.2 %    Comment: Performed at [facility], [address], [place], [place] [address]  Comprehensive metabolic panel     Status: Abnormal   Collection Time: 12/07/19  6:22 PM  Result Value Ref Range   Sodium 139 135 - 145 mmol/L   Potassium 3.3 (L) 3.5 - 5.1 mmol/L   Chloride 106 98 - 111 mmol/L   CO2 25 22 - 32 mmol/L   Glucose, Bld 141 (H) 70 - 99 mg/dL    Comment: Glucose reference range applies only to samples taken after fasting for at least 8 hours.   BUN 9 6 - 20 mg/dL   Creatinine, Ser 3.29 0.44 - 1.00 mg/dL   Calcium 9.1 8.9 - 92.4 mg/dL   Total Protein 7.3 6.5 - 8.1 g/dL   Albumin 4.2 3.5 - 5.0 g/dL   AST 20 15 - 41 U/L   ALT 17 0 - 44 U/L   Alkaline Phosphatase 53 38 - 126 U/L   Total Bilirubin 0.2 (L) 0.3 - 1.2 mg/dL   GFR calc non Af Amer >60 >60 mL/min   GFR calc Af Amer >  60 >60 mL/min   Anion gap 8 5 - 15    Comment: Performed at [facility], [address] [place], [place] [address]  Lipid panel     Status: Abnormal   Collection Time: 12/07/19  6:22 PM  Result Value Ref Range   Cholesterol 189 0 - 200 mg/dL   Triglycerides 47 <150 mg/dL   HDL 39 (L) >40 mg/dL   Total CHOL/HDL Ratio 4.8 RATIO   VLDL 9 0 - 40 mg/dL   LDL Cholesterol 141 (H) 0 - 99 mg/dL    Comment:        Total Cholesterol/HDL:CHD Risk Coronary Heart Disease Risk Table                      Men   Women  1/2 Average Risk   3.4   3.3  Average Risk       5.0   4.4  2 X Average Risk   9.6   7.1  3 X Average Risk  23.4   11.0        Use the calculated Patient Ratio above and the CHD Risk Table to determine the patient's CHD Risk.        ATP III CLASSIFICATION (LDL):  <100     mg/dL   Optimal  100-129  mg/dL   Near or Above                    Optimal  130-159  mg/dL   Borderline  160-189  mg/dL   High  >190     mg/dL   Very High Performed at [facility] [address] [place], [place] [address]   TSH     Status: None   Collection Time: 12/07/19  6:22 PM  Result Value Ref Range   TSH 0.646 0.350 - 4.500 uIU/mL    Comment: Performed by a 3rd Generation assay with a functional sensitivity of <=0.01 uIU/mL. Performed at [facility], [address], [place], [place] [address]     Blood Alcohol level:  No results found for: [facility]  Metabolic Disorder Labs: No results found for: HGBA1C, MPG No results found for: PROLACTIN Lab Results  Component Value Date   CHOL 189 12/07/2019   TRIG 47 12/07/2019   HDL 39 (L) 12/07/2019   CHOLHDL 4.8 12/07/2019   VLDL 9 12/07/2019   LDLCALC 141 (H) 12/07/2019    Physical Findings: AIMS: Facial and Oral Movements Muscles of Facial Expression: None, normal Lips and Perioral Area: None, normal Jaw: None, normal Tongue: None, normal,Extremity Movements Upper (arms, wrists, hands, fingers): None, normal Lower (legs, knees, ankles, toes): None, normal, Trunk Movements Neck, shoulders, hips: None, normal, Overall Severity Severity of abnormal movements (highest score from questions above): None, normal Incapacitation due to abnormal movements: None, normal Patient's awareness of abnormal movements (rate only patient's report): No Awareness, Dental Status Current problems with teeth and/or dentures?: No Does patient usually wear dentures?: No  CIWA:    COWS:      Musculoskeletal: Strength & Muscle Tone: within normal limits Gait & Station: normal Patient leans: N/A  Psychiatric Specialty Exam: Physical Exam  Nursing note and vitals reviewed. Constitutional: She is oriented to person, place, and time. She appears well-developed and well-nourished.  Cardiovascular: Normal rate.  Respiratory: Effort normal.  Neurological: She is alert and oriented to person, place, and time.    Review of Systems  Constitutional: Negative.  Respiratory: Negative for cough and shortness of breath.   Psychiatric/Behavioral: Positive for dysphoric mood and sleep disturbance. Negative for agitation, behavioral problems, confusion, hallucinations, self-injury and suicidal ideas. The patient is not nervous/anxious and is not hyperactive.     Blood pressure 115/86, pulse 92, temperature 98.6 F (37 C), temperature source Oral, resp. rate 18, height 5\' 5"  (1.651 m), weight 72.1 kg, last menstrual period 11/13/2019, SpO2 100 %.Body mass index is 26.46 kg/m.  General Appearance: Disheveled  Eye Contact:  Minimal  Speech:  Slow  Volume:  Normal  Mood:  Irritable  Affect:  Congruent  Thought Process:  Coherent  Orientation:  Full (Time, Place, and Person)  Thought Content:  Logical  Suicidal Thoughts:  No  Homicidal Thoughts:  No  Memory:  Immediate;   Fair Recent;   Fair  Judgement:  Fair  Insight:  Lacking  Psychomotor Activity:  Decreased  Concentration:  Concentration: Fair and Attention Span: Fair  Recall:  FiservFair  Fund of Knowledge:  Fair  Language:  Fair  Akathisia:  No  Handed:  Right  AIMS (if indicated):     Assets:  Communication Skills Leisure Time Physical Health  ADL's:  Intact  Cognition:  WNL  Sleep:  Number of Hours: 6     Treatment Plan Summary: Daily contact with patient to assess and evaluate symptoms and progress in treatment and Medication management   Continue inpatient hospitalization.  Start Remeron 7.5 mg PO QHS for  mood/sleep Discontinue Minipress Continue gabapentin 200 mg PO TID for anxiety/mood Continue Buspar 5 mg PO TID for anxiety Continue Vistaril 25 mg PO TID PRN anxiety Continue Flagyl 500 mg PO BID for BV Continue trazodone 50 mg PO QHS PRN insomnia  Patient will participate in the therapeutic group milieu.  Discharge disposition in progress.   Aldean BakerJanet E Nikola Blackston, NP 12/08/2019, 11:41 AM

## 2019-12-08 NOTE — Progress Notes (Signed)
Recreation Therapy Notes  Date:  5.17.21 Time: 0930 Location: [address] Dayroom  Group Topic: Stress Management  Goal Area(s) Addresses:  Patient will identify positive stress management techniques. Patient will identify benefits of using stress management post d/c.  Intervention: Stress Management  Activity:  Beach Visualization.  LRT read Harper script that took patients on Harper journey through [place].  Patients were to listen and follow along as script was read to engage in activity.    Education: Stress Management, Discharge Planning.   Education Outcome: Acknowledges Education  Clinical Observations/Feedback: Pt did not attend group activity.    Kimberly Harper, LRT/CTRS         Kimberly Harper 12/08/2019 11:22 AM 

## 2019-12-08 NOTE — Progress Notes (Signed)
   12/08/19 0644 12/08/19 0645  Vital Signs  Temp 98.6 F (37 C)  --   Temp Source Oral  --   Pulse Rate 70 92  BP 117/80 115/86  BP Location Left Arm Left Arm  BP Method Automatic Automatic  Patient Position (if appropriate) Sitting Standing  D:  Patient presents with a depressed affect Patient took morning medicines. Patient rated depression 6/10 and anxiety 0/10. Patient isolated in room. In the afternoon patient was in the dayroom and reported feeling "crabby" and "disconnected" Patient stated that she thinks that it's from overdosing on Topomax. A: Patient took scheduled medicine.  Support and encouragement provided Routine safety checks conducted every 15 minutes. Patient  Informed to notify staff with any concerns.   R: Safety maintained.

## 2019-12-09 LAB — HEMOGLOBIN A1C
Hgb A1c MFr Bld: 5.3 % (ref 4.8–5.6)
Mean Plasma Glucose: 105 mg/dL

## 2019-12-09 MED ORDER — ASPIRIN-ACETAMINOPHEN-CAFFEINE 250-250-65 MG PO TABS
1.0000 | ORAL_TABLET | Freq: Once | ORAL | Status: AC
  Administered 2019-12-09: 1 via ORAL
  Filled 2019-12-09 (×2): qty 1

## 2019-12-09 MED ORDER — HYDROCORTISONE 1 % EX CREA
TOPICAL_CREAM | Freq: Two times a day (BID) | CUTANEOUS | Status: DC | PRN
  Filled 2019-12-09: qty 28

## 2019-12-09 MED ORDER — GABAPENTIN 300 MG PO CAPS
300.0000 mg | ORAL_CAPSULE | Freq: Three times a day (TID) | ORAL | Status: DC
  Administered 2019-12-09 – 2019-12-12 (×8): 300 mg via ORAL
  Filled 2019-12-09 (×14): qty 1

## 2019-12-09 MED ORDER — MIRTAZAPINE 15 MG PO TABS
15.0000 mg | ORAL_TABLET | Freq: Every day | ORAL | Status: DC
  Administered 2019-12-10: 15 mg via ORAL
  Filled 2019-12-09 (×5): qty 1

## 2019-12-09 NOTE — Progress Notes (Signed)
[facility] MD Progress Note  12/09/2019 12:54 PM Kimberly Harper  MRN:  [id number] Subjective:  "I'm fine, I guess."  Kimberly Harper found lying in bed. She remains irritable but significantly less irritable than yesterday. She reports her menstrual cycle started this morning and that she typically experiences mood instability before and early in her cycle. She remains vague concerning reasons for hospitalization and declines to elaborate. She does state sleep was improved last night. She denies medication side effects. She continues to report depression and SI with no plan. She states SI has been ongoing for several months. She declines to elaborate on stressors. She continues to isolate to her room. Denies HI/AVH.  From admission H&P: Patient is a 34 year old female with a probable past psychiatric history significant for anxiety, depression, posttraumatic stress disorder and probable borderline personality disorder who presented to the [facility] behavioral [facility] hospital as a direct evaluation. The patient stated that she has been having mood instability recently, and describes laughing at 1 moment crying the next.  Principal Problem: <principal problem not specified> Diagnosis: Active Problems:   Bipolar 2 disorder, major depressive episode (HCC)  Total Time spent with patient: 15 minutes  Past Psychiatric History: See admission H&P  Past Medical History:  Past Medical History:  Diagnosis Date  . Anxiety   . Bipolar 1 disorder (HCC)   . Major depressive disorder     Past Surgical History:  Procedure Laterality Date  . CESAREAN SECTION    . DILATION AND CURETTAGE OF UTERUS     Family History:  Family History  Problem Relation Age of Onset  . Hypertension Mother   . Diabetes Mother   . Stroke Mother    Family Psychiatric  History: See admission H&P Social History:  Social History   Substance and Sexual Activity  Alcohol Use Yes   Comment: socially     Social History    Substance and Sexual Activity  Drug Use Never    Social History   Socioeconomic History  . Marital status: Single    Spouse name: Not on file  . Number of children: Not on file  . Years of education: Not on file  . Highest education level: Not on file  Occupational History  . Not on file  Tobacco Use  . Smoking status: Never Smoker  . Smokeless tobacco: Never Used  Substance and Sexual Activity  . Alcohol use: Yes    Comment: socially  . Drug use: Never  . Sexual activity: Not Currently    Birth control/protection: Abstinence  Other Topics Concern  . Not on file  Social History Narrative  . Not on file   Social Determinants of Health   Financial Resource Strain:   . Difficulty of Paying Living Expenses:   Food Insecurity:   . Worried About Programme researcher, broadcasting/film/video in the Last Year:   . Barista in the Last Year:   Transportation Needs:   . Freight forwarder (Medical):   Marland Kitchen Lack of Transportation (Non-Medical):   Physical Activity:   . Days of Exercise per Week:   . Minutes of Exercise per Session:   Stress:   . Feeling of Stress :   Social Connections:   . Frequency of Communication with Friends and Family:   . Frequency of Social Gatherings with Friends and Family:   . Attends Religious Services:   . Active Member of Clubs or Organizations:   . Attends Banker Meetings:   .  Marital Status:    Additional Social History:    Pain Medications: Denies abuse Prescriptions: Denies abuse Over the Counter: Denies abuse History of alcohol / drug use?: No history of alcohol / drug abuse Longest period of sobriety (when/how long): NA                    Sleep: Good  Appetite:  Good  Current Medications: Current Facility-Administered Medications  Medication Dose Route Frequency Provider Last Rate Last Admin  . acetaminophen (TYLENOL) tablet 650 mg  650 mg Oral Q6H PRN Nira Conn A, NP      . alum & mag hydroxide-simeth  (MAALOX/MYLANTA) 200-200-20 MG/5ML suspension 30 mL  30 mL Oral Q4H PRN Nira Conn A, NP      . busPIRone (BUSPAR) tablet 5 mg  5 mg Oral TID Antonieta Pert, MD   5 mg at 12/09/19 1235  . gabapentin (NEURONTIN) capsule 200 mg  200 mg Oral TID Antonieta Pert, MD   200 mg at 12/09/19 1235  . hydrOXYzine (ATARAX/VISTARIL) tablet 25 mg  25 mg Oral TID PRN Jackelyn Poling, NP   25 mg at 12/07/19 2105  . magnesium hydroxide (MILK OF MAGNESIA) suspension 30 mL  30 mL Oral Daily PRN Nira Conn A, NP      . metroNIDAZOLE (FLAGYL) tablet 500 mg  500 mg Oral Q12H Antonieta Pert, MD   500 mg at 12/09/19 3500  . mirtazapine (REMERON) tablet 7.5 mg  7.5 mg Oral QHS Aldean Baker, NP      . traZODone (DESYREL) tablet 50 mg  50 mg Oral QHS PRN Jackelyn Poling, NP        Lab Results:  Results for orders placed or performed during the hospital encounter of 12/06/19 (from the past 48 hour(s))  CBC     Status: Abnormal   Collection Time: 12/07/19  6:22 PM  Result Value Ref Range   WBC 9.5 4.0 - 10.5 K/uL   RBC 4.29 3.87 - 5.11 MIL/uL   Hemoglobin 11.1 (L) 12.0 - 15.0 g/dL   HCT 93.8 (L) 18.2 - 99.3 %   MCV 81.8 80.0 - 100.0 fL   MCH 25.9 (L) 26.0 - 34.0 pg   MCHC 31.6 30.0 - 36.0 g/dL   RDW 71.6 (H) 96.7 - 89.3 %   Platelets 311 150 - 400 K/uL   nRBC 0.0 0.0 - 0.2 %    Comment: Performed at [facility], [address] [place], [place] [address]  Comprehensive metabolic panel     Status: Abnormal   Collection Time: 12/07/19  6:22 PM  Result Value Ref Range   Sodium 139 135 - 145 mmol/L   Potassium 3.3 (L) 3.5 - 5.1 mmol/L   Chloride 106 98 - 111 mmol/L   CO2 25 22 - 32 mmol/L   Glucose, Bld 141 (H) 70 - 99 mg/dL    Comment: Glucose reference range applies only to samples taken after fasting for at least 8 hours.   BUN 9 6 - 20 mg/dL   Creatinine, Ser 5.10 0.44 - 1.00 mg/dL   Calcium 9.1 8.9 - 25.8 mg/dL   Total Protein 7.3 6.5 - 8.1 g/dL   Albumin 4.2 3.5 - 5.0  g/dL   AST 20 15 - 41 U/L   ALT 17 0 - 44 U/L   Alkaline Phosphatase 53 38 - 126 U/L   Total Bilirubin 0.2 (L) 0.3 - 1.2 mg/dL  GFR calc non Af Amer >60 >60 mL/min   GFR calc Af Amer >60 >60 mL/min   Anion gap 8 5 - 15    Comment: Performed at [facility], [place] [address] [place], [place] [address]  Hemoglobin A1c     Status: None   Collection Time: 12/07/19  6:22 PM  Result Value Ref Range   Hgb A1c MFr Bld 5.3 4.8 - 5.6 %    Comment: (NOTE)         Prediabetes: 5.7 - 6.4         Diabetes: >6.4         Glycemic control for adults with diabetes: <7.0    Mean Plasma Glucose 105 mg/dL    Comment: (NOTE) Performed At: [facility] [place], [place] [id number] Rush Farmer MD [id number]   Lipid panel     Status: Abnormal   Collection Time: 12/07/19  6:22 PM  Result Value Ref Range   Cholesterol 189 0 - 200 mg/dL   Triglycerides 47 <150 mg/dL   HDL 39 (L) >40 mg/dL   Total CHOL/HDL Ratio 4.8 RATIO   VLDL 9 0 - 40 mg/dL   LDL Cholesterol 141 (H) 0 - 99 mg/dL    Comment:        Total Cholesterol/HDL:CHD Risk Coronary Heart Disease Risk Table                     Men   Women  1/2 Average Risk   3.4   3.3  Average Risk       5.0   4.4  2 X Average Risk   9.6   7.1  3 X Average Risk  23.4   11.0        Use the calculated Patient Ratio above and the CHD Risk Table to determine the patient's CHD Risk.        ATP III CLASSIFICATION (LDL):  <100     mg/dL   Optimal  100-129  mg/dL   Near or Above                    Optimal  130-159  mg/dL   Borderline  160-189  mg/dL   High  >190     mg/dL   Very High Performed at [facility] [address], [place], [place] [address]   TSH     Status: None   Collection Time: 12/07/19  6:22 PM  Result Value Ref Range   TSH 0.646 0.350 - 4.500 uIU/mL    Comment: Performed by a 3rd Generation assay with a functional sensitivity of <=0.01 uIU/mL. Performed at [facility], [place] [address] [place],  [address]     Blood Alcohol level:  No results found for: [facility]  Metabolic Disorder Labs: Lab Results  Component Value Date   HGBA1C 5.3 12/07/2019   MPG 105 12/07/2019   No results found for: PROLACTIN Lab Results  Component Value Date   CHOL 189 12/07/2019   TRIG 47 12/07/2019   HDL 39 (L) 12/07/2019   CHOLHDL 4.8 12/07/2019   VLDL 9 12/07/2019   LDLCALC 141 (H) 12/07/2019    Physical Findings: AIMS: Facial and Oral Movements Muscles of Facial Expression: None, normal Lips and Perioral Area: None, normal Jaw: None, normal Tongue: None, normal,Extremity Movements Upper (arms, wrists, hands, fingers): None, normal Lower (legs, knees, ankles, toes): None, normal, Trunk Movements Neck, shoulders, hips:  None, normal, Overall Severity Severity of abnormal movements (highest score from questions above): None, normal Incapacitation due to abnormal movements: None, normal Patient's awareness of abnormal movements (rate only patient's report): No Awareness, Dental Status Current problems with teeth and/or dentures?: No Does patient usually wear dentures?: No  CIWA:    COWS:     Musculoskeletal: Strength & Muscle Tone: within normal limits Gait & Station: normal Patient leans: N/A  Psychiatric Specialty Exam: Physical Exam  Nursing note and vitals reviewed. Constitutional: She is oriented to person, place, and time. She appears well-developed and well-nourished.  Cardiovascular: Normal rate.  Respiratory: Effort normal.  Neurological: She is alert and oriented to person, place, and time.    Review of Systems  Constitutional: Negative.   Respiratory: Negative for cough and shortness of breath.   Psychiatric/Behavioral: Positive for dysphoric mood and suicidal ideas. Negative for agitation, behavioral problems, confusion, hallucinations, self-injury and sleep disturbance. The patient is not nervous/anxious and is not  hyperactive.     Blood pressure 113/81, pulse 73, temperature 98 F (36.7 C), temperature source Oral, resp. rate 18, height 5\' 5"  (1.651 m), weight 72.1 kg, last menstrual period 11/13/2019, SpO2 100 %.Body mass index is 26.46 kg/m.  General Appearance: Disheveled  Eye Contact:  Minimal  Speech:  Slow  Volume:  Decreased  Mood:  Depressed and Irritable  Affect:  Congruent  Thought Process:  Coherent  Orientation:  Full (Time, Place, and Person)  Thought Content:  Logical  Suicidal Thoughts:  No  Homicidal Thoughts:  No  Memory:  Immediate;   Fair Recent;   Fair  Judgement:  Intact  Insight:  Fair  Psychomotor Activity:  Decreased  Concentration:  Concentration: Fair and Attention Span: Fair  Recall:  11/15/2019 of Knowledge:  Fair  Language:  Fair  Akathisia:  No  Handed:  Right  AIMS (if indicated):     Assets:  Communication Skills Leisure Time Resilience  ADL's:  Intact  Cognition:  WNL  Sleep:  Number of Hours: 4.5     Treatment Plan Summary: Daily contact with patient to assess and evaluate symptoms and progress in treatment and Medication management   Continue inpatient hospitalization.  Increase Remeron to 15 mg PO QHS for mood/sleep Increase gabapentin to 300 mg PO TID for anxiety/mood Continue Buspar 5 mg PO TID for anxiety Continue Vistaril 25 mg PO TID PRN anxiety Continue Flagyl 500 mg PO BID for BV Continue trazodone 50 mg PO QHS PRN insomnia  Patient will participate in the therapeutic group milieu.  Discharge disposition in progress    [employer], NP 12/09/2019, 12:54 PM

## 2019-12-09 NOTE — Progress Notes (Signed)
DAR NOTE: Patient presents with calm affect and bright mood.  Denies pain, auditory and visual hallucinations. Maintained on routine safety checks.  Medications given as prescribed.  Support and encouragement offered as needed. Will continue to monitor. 

## 2019-12-09 NOTE — Progress Notes (Signed)
Recreation Therapy Notes  Animal-Assisted Activity (AAA) Program Checklist/Progress Notes Patient Eligibility Criteria Checklist & Daily Group note for Rec Tx Intervention  Date: 5.18.21 Time: 1430 Location: [address]   AAA/T Program Assumption of Risk Form signed by Engineer, production or Parent Legal Guardian  YES   Patient is free of allergies or sever asthma  YES   Patient reports no fear of animals  YES   Patient reports no history of cruelty to animals YES   Patient understands his/her participation is voluntary YES  Patient washes hands before animal contact  YES   Patient washes hands after animal contact  YES   Behavioral Response: Engaged  Education: Charity fundraiser, Appropriate Animal Interaction   Education Outcome: Acknowledges understanding/In group clarification offered/Needs additional education.   Clinical Observations/Feedback: Pt attended and participated in group activity.    Kimberly Harper, LRT/CTRS         Kimberly Harper A 12/09/2019 3:28 PM

## 2019-12-09 NOTE — Plan of Care (Signed)
  Problem: Activity: Goal: Interest or engagement in activities will improve Outcome: Progressing   Problem: Coping: Goal: Ability to verbalize frustrations and anger appropriately will improve Outcome: Progressing   Problem: Safety: Goal: Periods of time without injury will increase Outcome: Progressing   

## 2019-12-09 NOTE — Plan of Care (Signed)
Nurse discussed anxiety, depression and coping skills with patient.  

## 2019-12-09 NOTE — Progress Notes (Signed)
D:  Patient's self inventory sheet, patient sleeps good, no sleep medication.  Good appetite, low energy level.,  Rated depression 8, hopeless 10, anxiety 6.  Denied withdrawals.   SI, yes, does not know what woke her up.   Physical problems, pain, female cycle, worst pain #2 in past 24 hours, cramps.  Goal is to read, make time to read.  Will talk to MD about meds. A:  Medications administered per MD orders.  Emotional support and encouragement given patient. R:  Denied SI and HI.  Denied A/V hallucinations.  Safety maintained with 15 minute checks.

## 2019-12-10 MED ORDER — ARIPIPRAZOLE 5 MG PO TABS
5.0000 mg | ORAL_TABLET | Freq: Every day | ORAL | Status: DC
  Administered 2019-12-10 – 2019-12-12 (×3): 5 mg via ORAL
  Filled 2019-12-10 (×5): qty 1

## 2019-12-10 NOTE — Progress Notes (Signed)
[facility] MD Progress Note  12/10/2019 2:14 PM Kimberly Harper  MRN:  [id number]   Subjective: Follow-up for this 34 year old female diagnosed with bipolar 2 disorder.  Patient reports that things seem to be improving a little bit because she has no anxiety today, however she reports that her depression is still very severe and she still has thoughts of better off not being here, and she goes on to explain that she means as being dead.  She states she does not have an active suicidal thoughts but feels that being dead would be better off.  She states that she is just tired of dealing with all of the medications and treatments and it does not seem like anything is getting any better for her.  She reports a long history of various medications to treat her bipolar disorder and she states that she usually has some type of bad side effect to it.  She reports that Geodon made her feel sick, Seroquel caused too much weight gain, Latuda did little to nothing for her, Risperdal made her sick, Arman Filter made her tired or have severe anxiety, lithium made her sick, Lamictal caused suicidal ideations, and Tegretol and Depakote she had severe side effects and stopped taking the medications.  She does not remember taking Zyprexa or Abilify but states that there is a possibility she had been on those in the past as well because she has been on so many medications.  She does agree to start Abilify at a low dose to ensure that she can tolerate it well and then can titrate up as needed.  She currently denies any homicidal ideations and denies any hallucinations.  She reported that she slept good last night and she has been eating better.  Principal Problem: Bipolar 2 disorder, major depressive episode (HCC) Diagnosis: Principal Problem:   Bipolar 2 disorder, major depressive episode ([place])  Total Time spent with patient: 30 minutes  Past Psychiatric History: Patient stated that she had been hospitalized proximately every  year since age 1 or 54.  She stated there may have been 1 or 2 years where she was able to avoid hospitalization.  These hospitalizations apparently were all in [place].  She stated she has been treated with multiple medications in the past, none have been successful.  She stated that most recently she was seen at [facility] in [place].  He stated they were not helpful, and the medications they recommended she was never able to get because "they had to be prior authorized".  Recommendations were for BuSpar and Neurontin  Past Medical History:  Past Medical History:  Diagnosis Date  . Anxiety   . Bipolar 1 disorder ([place])   . Major depressive disorder     Past Surgical History:  Procedure Laterality Date  . CESAREAN SECTION    . DILATION AND CURETTAGE OF UTERUS     Family History:  Family History  Problem Relation Age of Onset  . Hypertension Mother   . Diabetes Mother   . Stroke Mother    Family Psychiatric  History: She has multiple family members with multiple different disorders including bipolar disorder, depression, anxiety and schizophrenia Social History:  Social History   Substance and Sexual Activity  Alcohol Use Yes   Comment: socially     Social History   Substance and Sexual Activity  Drug Use Never    Social History   Socioeconomic History  . Marital status: Single    Spouse name: Not on file  .  Number of children: Not on file  . Years of education: Not on file  . Highest education level: Not on file  Occupational History  . Not on file  Tobacco Use  . Smoking status: Never Smoker  . Smokeless tobacco: Never Used  Substance and Sexual Activity  . Alcohol use: Yes    Comment: socially  . Drug use: Never  . Sexual activity: Not Currently    Birth control/protection: Abstinence  Other Topics Concern  . Not on file  Social History Narrative  . Not on file   Social Determinants of Health   Financial Resource Strain:   . Difficulty of Paying  Living Expenses:   Food Insecurity:   . Worried About Programme researcher, broadcasting/film/video in the Last Year:   . Barista in the Last Year:   Transportation Needs:   . Freight forwarder (Medical):   Marland Kitchen Lack of Transportation (Non-Medical):   Physical Activity:   . Days of Exercise per Week:   . Minutes of Exercise per Session:   Stress:   . Feeling of Stress :   Social Connections:   . Frequency of Communication with Friends and Family:   . Frequency of Social Gatherings with Friends and Family:   . Attends Religious Services:   . Active Member of Clubs or Organizations:   . Attends Banker Meetings:   Marland Kitchen Marital Status:    Additional Social History:    Pain Medications: Denies abuse Prescriptions: Denies abuse Over the Counter: Denies abuse History of alcohol / drug use?: No history of alcohol / drug abuse Longest period of sobriety (when/how long): NA                    Sleep: Good  Appetite:  Good  Current Medications: Current Facility-Administered Medications  Medication Dose Route Frequency Provider Last Rate Last Admin  . acetaminophen (TYLENOL) tablet 650 mg  650 mg Oral Q6H PRN Nira Conn A, NP      . alum & mag hydroxide-simeth (MAALOX/MYLANTA) 200-200-20 MG/5ML suspension 30 mL  30 mL Oral Q4H PRN Nira Conn A, NP      . ARIPiprazole (ABILIFY) tablet 5 mg  5 mg Oral Daily Jayston Trevino, Feliz Beam B, FNP   5 mg at 12/10/19 1337  . busPIRone (BUSPAR) tablet 5 mg  5 mg Oral TID Antonieta Pert, MD   5 mg at 12/10/19 1236  . gabapentin (NEURONTIN) capsule 300 mg  300 mg Oral TID Aldean Baker, NP   300 mg at 12/10/19 1338  . hydrocortisone cream 1 %   Topical BID PRN Aldean Baker, NP   Given at 12/09/19 1525  . hydrOXYzine (ATARAX/VISTARIL) tablet 25 mg  25 mg Oral TID PRN Jackelyn Poling, NP   25 mg at 12/07/19 2105  . magnesium hydroxide (MILK OF MAGNESIA) suspension 30 mL  30 mL Oral Daily PRN Nira Conn A, NP      . metroNIDAZOLE (FLAGYL) tablet 500  mg  500 mg Oral Q12H Antonieta Pert, MD   500 mg at 12/10/19 3825  . mirtazapine (REMERON) tablet 15 mg  15 mg Oral QHS Aldean Baker, NP      . traZODone (DESYREL) tablet 50 mg  50 mg Oral QHS PRN Jackelyn Poling, NP        Lab Results: No results found for this or any previous visit (from the past 48 hour(s)).  Blood Alcohol level:  No results found for: [facility]  Metabolic Disorder Labs: Lab Results  Component Value Date   HGBA1C 5.3 12/07/2019   MPG 105 12/07/2019   No results found for: PROLACTIN Lab Results  Component Value Date   CHOL 189 12/07/2019   TRIG 47 12/07/2019   HDL 39 (L) 12/07/2019   CHOLHDL 4.8 12/07/2019   VLDL 9 12/07/2019   LDLCALC 141 (H) 12/07/2019    Physical Findings: AIMS: Facial and Oral Movements Muscles of Facial Expression: None, normal Lips and Perioral Area: None, normal Jaw: None, normal Tongue: None, normal,Extremity Movements Upper (arms, wrists, hands, fingers): None, normal Lower (legs, knees, ankles, toes): None, normal, Trunk Movements Neck, shoulders, hips: None, normal, Overall Severity Severity of abnormal movements (highest score from questions above): None, normal Incapacitation due to abnormal movements: None, normal Patient's awareness of abnormal movements (rate only patient's report): No Awareness, Dental Status Current problems with teeth and/or dentures?: No Does patient usually wear dentures?: No  CIWA:    COWS:     Musculoskeletal: Strength & Muscle Tone: within normal limits Gait & Station: normal Patient leans: N/A  Psychiatric Specialty Exam: Physical Exam  Nursing note and vitals reviewed. Constitutional: She is oriented to person, place, and time. She appears well-developed and well-nourished.  Cardiovascular: Normal rate.  Respiratory: Effort normal.  Musculoskeletal:        General: Normal range of motion.  Neurological: She is alert and oriented to person, place, and time.  Skin: Skin is warm.   Psychiatric: She exhibits a depressed mood.    Review of Systems  Constitutional: Negative.   HENT: Negative.   Eyes: Negative.   Respiratory: Negative.   Cardiovascular: Negative.   Gastrointestinal: Negative.   Genitourinary: Negative.   Musculoskeletal: Negative.   Skin: Negative.   Neurological: Negative.   Psychiatric/Behavioral: Positive for dysphoric mood.    Blood pressure 111/79, pulse 74, temperature 98 F (36.7 C), temperature source Oral, resp. rate 18, height 5\' 5"  (1.651 m), weight 72.1 kg, last menstrual period 11/13/2019, SpO2 100 %.Body mass index is 26.46 kg/m.  General Appearance: Casual  Eye Contact:  Good  Speech:  Clear and Coherent and Normal Rate  Volume:  Decreased  Mood:  Depressed  Affect:  Depressed  Thought Process:  Coherent and Descriptions of Associations: Intact  Orientation:  Full (Time, Place, and Person)  Thought Content:  WDL  Suicidal Thoughts:  Yes.  without intent/plan  Homicidal Thoughts:  No  Memory:  Immediate;   Fair Recent;   Fair Remote;   Fair  Judgement:  Fair  Insight:  Fair  Psychomotor Activity:  Normal  Concentration:  Concentration: Fair  Recall:  11/15/2019 of Knowledge:  Fair  Language:  Fair  Akathisia:  No  Handed:  Right  AIMS (if indicated):     Assets:  Communication Skills Desire for Improvement Financial Resources/Insurance Housing Physical Health  ADL's:  Intact  Cognition:  WNL  Sleep:  Number of Hours: 6.75   Assessment: Patient presents in the day room interacting with peers and staff appropriately and she has been seen attending groups.  Patient is pleasant, calm, and cooperative.  She is continued to have a flat affect during the evaluation.  Patient does show some borderline personality traits due to the various medications as well as no improvement with any medications.  However the patient is in agreement to start Abilify and will continue the Neurontin, BuSpar, and Remeron.  No new labs have  been drawn.  Vital signs are WNL.  Treatment Plan Summary: Daily contact with patient to assess and evaluate symptoms and progress in treatment and Medication management Start Abilify 5 mg p.o. daily for bipolar disorder Continue BuSpar 5 mg p.o. 3 times daily for anxiety Continue Neurontin 300 mg p.o. 3 times daily for mood stability and anxiety Continue Remeron 15 mg p.o. nightly for sleep and mood stability Continue trazodone 50 mg p.o. nightly as needed for insomnia Encourage group therapy participation Continue every 15 minute safety checks  Maryfrances Bunnell, FNP 12/10/2019, 2:14 PM

## 2019-12-10 NOTE — Progress Notes (Signed)
Pt stated she was still dealing with the highs and lows emotions    12/10/19 2100  Psych Admission Type (Psych Patients Only)  Admission Status Voluntary  Psychosocial Assessment  Patient Complaints Anxiety;Depression  Eye Contact Brief  Facial Expression Flat  Affect Appropriate to circumstance;Labile  Speech Logical/coherent  Interaction Assertive  Motor Activity Fidgety  Appearance/Hygiene Unremarkable  Behavior Characteristics Cooperative  Mood Depressed  Thought Process  Coherency WDL  Content WDL  Delusions None reported or observed  Perception WDL  Hallucination None reported or observed  Judgment Impaired  Confusion None  Danger to Self  Current suicidal ideation? Denies  Self-Injurious Behavior No self-injurious ideation or behavior indicators observed or expressed   Agreement Not to Harm Self Yes  Description of Agreement verbal  Danger to Others  Danger to Others None reported or observed

## 2019-12-10 NOTE — [facility] Group Notes (Signed)
Pt did not attend wrap up group this evening. Pt was in bed sleeping.  

## 2019-12-10 NOTE — Progress Notes (Signed)
   12/10/19 0058  Charting Type  Charting Type Shift assessment  Orders Chart Check (once per shift) Completed  Safety Check Verification  Has the RN verified the 15 minute safety check completion? Yes  Neurological  Neuro (WDL) WDL  HEENT  HEENT (WDL) WDL  Respiratory  Respiratory (WDL) WDL  Cardiac  Cardiac (WDL) WDL  Vascular  Vascular (WDL) WDL  Integumentary  Integumentary (WDL) WDL  Braden Scale (Ages 8 and up)  Sensory Perceptions 4  Moisture 4  Activity 4  Mobility 4  Nutrition 3  Friction and Shear 3  Braden Scale Score 22  Musculoskeletal  Musculoskeletal (WDL) WDL  Gastrointestinal  Gastrointestinal (WDL) WDL  GU Assessment  Genitourinary (WDL) WDL

## 2019-12-11 NOTE — Progress Notes (Signed)
D:  Patient denied SI and HI, contracts for safety.  Denied A/V hallucinations.  Denied pain. A:  Medications administered per MD orders.  Emotional support and encouragement given patient. R:  Safety maintained with 15 minute checks.  

## 2019-12-11 NOTE — Plan of Care (Signed)
Nurse discussed anxiety, depression and coping skills with patient.  

## 2019-12-11 NOTE — Progress Notes (Signed)
[facility] MD Progress Note  12/11/2019 10:43 AM Kimberly Harper  MRN:  [id number]   Subjective: Follow-up for this 34 year old female diagnosed with bipolar 2 disorder.  Patient reports today that she feels about the same as she did yesterday.  However, she denies any suicidal or homicidal ideations and denies any hallucinations.  She denies having any side effects to her medications including the Abilify which was started yesterday.  She does report that she just feels down and is unsure of what to do next.  After discussing with patient the possibility of therapy she is in agreement to start PHP or IOP and is requesting a referral.  She feels that that would be a good starting point after discharge.  She reports that she slept well and that her appetite has been unchanged and good.  Principal Problem: Bipolar 2 disorder, major depressive episode (HCC) Diagnosis: Principal Problem:   Bipolar 2 disorder, major depressive episode (HCC)  Total Time spent with patient: 20 minutes  Past Psychiatric History: Patient stated that she had been hospitalized proximately every year since age 5 or 39.  She stated there may have been 1 or 2 years where she was able to avoid hospitalization.  These hospitalizations apparently were all in [place].  She stated she has been treated with multiple medications in the past, none have been successful.  She stated that most recently she was seen at [facility] in [place].  He stated they were not helpful, and the medications they recommended she was never able to get because "they had to be prior authorized".  Recommendations were for BuSpar and Neurontin.  Past Medical History:  Past Medical History:  Diagnosis Date  . Anxiety   . Bipolar 1 disorder (HCC)   . Major depressive disorder     Past Surgical History:  Procedure Laterality Date  . CESAREAN SECTION    . DILATION AND CURETTAGE OF UTERUS     Family History:  Family History  Problem Relation Age of  Onset  . Hypertension Mother   . Diabetes Mother   . Stroke Mother    Family Psychiatric  History: She has multiple family members with multiple different disorders including bipolar disorder, depression, anxiety and schizophrenia Social History:  Social History   Substance and Sexual Activity  Alcohol Use Yes   Comment: socially     Social History   Substance and Sexual Activity  Drug Use Never    Social History   Socioeconomic History  . Marital status: Single    Spouse name: Not on file  . Number of children: Not on file  . Years of education: Not on file  . Highest education level: Not on file  Occupational History  . Not on file  Tobacco Use  . Smoking status: Never Smoker  . Smokeless tobacco: Never Used  Substance and Sexual Activity  . Alcohol use: Yes    Comment: socially  . Drug use: Never  . Sexual activity: Not Currently    Birth control/protection: Abstinence  Other Topics Concern  . Not on file  Social History Narrative  . Not on file   Social Determinants of Health   Financial Resource Strain:   . Difficulty of Paying Living Expenses:   Food Insecurity:   . Worried About Programme researcher, broadcasting/film/video in the Last Year:   . Barista in the Last Year:   Transportation Needs:   . Freight forwarder (Medical):   Marland Kitchen Lack of Transportation (  Non-Medical):   Physical Activity:   . Days of Exercise per Week:   . Minutes of Exercise per Session:   Stress:   . Feeling of Stress :   Social Connections:   . Frequency of Communication with Friends and Family:   . Frequency of Social Gatherings with Friends and Family:   . Attends Religious Services:   . Active Member of Clubs or Organizations:   . Attends Archivist Meetings:   Marland Kitchen Marital Status:    Additional Social History:    Pain Medications: Denies abuse Prescriptions: Denies abuse Over the Counter: Denies abuse History of alcohol / drug use?: No history of alcohol / drug  abuse Longest period of sobriety (when/how long): NA                    Sleep: Good  Appetite:  Good  Current Medications: Current Facility-Administered Medications  Medication Dose Route Frequency Provider Last Rate Last Admin  . acetaminophen (TYLENOL) tablet 650 mg  650 mg Oral Q6H PRN Lindon Romp A, NP      . alum & mag hydroxide-simeth (MAALOX/MYLANTA) 200-200-20 MG/5ML suspension 30 mL  30 mL Oral Q4H PRN Lindon Romp A, NP      . ARIPiprazole (ABILIFY) tablet 5 mg  5 mg Oral Daily Holleigh Crihfield, Lowry Ram, FNP   5 mg at 12/11/19 9242  . busPIRone (BUSPAR) tablet 5 mg  5 mg Oral TID Sharma Covert, MD   5 mg at 12/11/19 0804  . gabapentin (NEURONTIN) capsule 300 mg  300 mg Oral TID Connye Burkitt, NP   300 mg at 12/11/19 0804  . hydrocortisone cream 1 %   Topical BID PRN Connye Burkitt, NP   Given at 12/09/19 1525  . hydrOXYzine (ATARAX/VISTARIL) tablet 25 mg  25 mg Oral TID PRN Rozetta Nunnery, NP   25 mg at 12/07/19 2105  . magnesium hydroxide (MILK OF MAGNESIA) suspension 30 mL  30 mL Oral Daily PRN Lindon Romp A, NP      . metroNIDAZOLE (FLAGYL) tablet 500 mg  500 mg Oral Q12H Sharma Covert, MD   500 mg at 12/11/19 0804  . mirtazapine (REMERON) tablet 15 mg  15 mg Oral QHS Connye Burkitt, NP   15 mg at 12/10/19 2117  . traZODone (DESYREL) tablet 50 mg  50 mg Oral QHS PRN Rozetta Nunnery, NP        Lab Results: No results found for this or any previous visit (from the past 48 hour(s)).  Blood Alcohol level:  No results found for: [facility]  Metabolic Disorder Labs: Lab Results  Component Value Date   HGBA1C 5.3 12/07/2019   MPG 105 12/07/2019   No results found for: PROLACTIN Lab Results  Component Value Date   CHOL 189 12/07/2019   TRIG 47 12/07/2019   HDL 39 (L) 12/07/2019   CHOLHDL 4.8 12/07/2019   VLDL 9 12/07/2019   LDLCALC 141 (H) 12/07/2019    Physical Findings: AIMS: Facial and Oral Movements Muscles of Facial Expression: None, normal Lips and  Perioral Area: None, normal Jaw: None, normal Tongue: None, normal,Extremity Movements Upper (arms, wrists, hands, fingers): None, normal Lower (legs, knees, ankles, toes): None, normal, Trunk Movements Neck, shoulders, hips: None, normal, Overall Severity Severity of abnormal movements (highest score from questions above): None, normal Incapacitation due to abnormal movements: None, normal Patient's awareness of abnormal movements (rate only patient's report): No Awareness, Dental Status Current problems with  teeth and/or dentures?: No Does patient usually wear dentures?: No  CIWA:    COWS:     Musculoskeletal: Strength & Muscle Tone: within normal limits Gait & Station: normal Patient leans: N/A  Psychiatric Specialty Exam: Physical Exam  Nursing note and vitals reviewed. Constitutional: She is oriented to person, place, and time. She appears well-developed and well-nourished.  Cardiovascular: Normal rate.  Respiratory: Effort normal.  Musculoskeletal:        General: Normal range of motion.  Neurological: She is alert and oriented to person, place, and time.  Skin: Skin is warm.  Psychiatric: She exhibits a depressed mood.    Review of Systems  Constitutional: Negative.   HENT: Negative.   Eyes: Negative.   Respiratory: Negative.   Cardiovascular: Negative.   Gastrointestinal: Negative.   Genitourinary: Negative.   Musculoskeletal: Negative.   Skin: Negative.   Neurological: Negative.   Psychiatric/Behavioral: Positive for dysphoric mood.    Blood pressure 111/70, pulse 75, temperature 97.9 F (36.6 C), temperature source Oral, resp. rate 16, height 5\' 5"  (1.651 m), weight 72.1 kg, last menstrual period 11/13/2019, SpO2 100 %.Body mass index is 26.46 kg/m.  General Appearance: Casual  Eye Contact:  Good  Speech:  Clear and Coherent and Normal Rate  Volume:  Decreased  Mood:  Depressed  Affect:  Flat  Thought Process:  Coherent and Descriptions of Associations:  Intact  Orientation:  Full (Time, Place, and Person)  Thought Content:  WDL  Suicidal Thoughts:  No  Homicidal Thoughts:  No  Memory:  Immediate;   Good Recent;   Good Remote;   Good  Judgement:  Fair  Insight:  Fair  Psychomotor Activity:  Normal  Concentration:  Concentration: Fair  Recall:  Fair  Fund of Knowledge:  Fair  Language:  Fair  Akathisia:  No  Handed:  Right  AIMS (if indicated):     Assets:  Communication Skills Desire for Improvement Financial Resources/Insurance Housing Physical Health Social Support Transportation  ADL's:  Intact  Cognition:  WNL  Sleep:  Number of Hours: 7   Assessment: Patient presents in her room lying in the bed but is awake.  Patient has been compliant with her medications and treatment and has attended some groups.  Patient continues to display some borderline personality traits, however has been compliant with medications and is reporting that she would feel okay to go to a another treatment program when she discharges.  CSW has been informed and is seeking referral for PHP.  Plan will be to discharge patient possibly in the next 1 to 2 days if she continues to show improvement.  Patient vital signs remained WNL.  Labs reviewed and no new labs since 12/07/2019.  Treatment Plan Summary: Daily contact with patient to assess and evaluate symptoms and progress in treatment and Medication management Continue Abilify 5 mg p.o. daily for mood stability Continue BuSpar 5 mg p.o. 3 times daily for anxiety Continue Neurontin 300 mg p.o. 3 times daily for mood stability Continue Remeron 15 mg p.o. nightly for sleep and mood stability Continue trazodone 50 mg p.o. nightly as needed for insomnia Encourage group therapy participation Continue every 15 minute safety checks Social work to assist with referral to 12/09/2019, FNP 12/11/2019, 10:43 AM

## 2019-12-11 NOTE — Progress Notes (Signed)
   12/11/19 2200  Psych Admission Type (Psych Patients Only)  Admission Status Voluntary  Psychosocial Assessment  Patient Complaints Anxiety;Depression  Eye Contact Brief  Facial Expression Flat  Affect Appropriate to circumstance;Labile  Speech Logical/coherent  Interaction Assertive  Motor Activity Fidgety  Appearance/Hygiene Unremarkable  Behavior Characteristics Anxious  Mood Depressed  Thought Process  Coherency WDL  Content WDL  Delusions None reported or observed  Perception WDL  Hallucination None reported or observed  Judgment Impaired  Confusion None  Danger to Self  Current suicidal ideation? Denies  Self-Injurious Behavior No self-injurious ideation or behavior indicators observed or expressed   Agreement Not to Harm Self Yes  Description of Agreement verbal  Danger to Others  Danger to Others None reported or observed

## 2019-12-12 MED ORDER — HYDROXYZINE HCL 25 MG PO TABS: 25.0000 mg | ORAL_TABLET | Freq: Three times a day (TID) | ORAL | 0 refills | Status: DC | PRN

## 2019-12-12 MED ORDER — BUSPIRONE HCL 5 MG PO TABS: 5.0000 mg | ORAL_TABLET | Freq: Three times a day (TID) | ORAL | 0 refills | Status: DC

## 2019-12-12 MED ORDER — ARIPIPRAZOLE 5 MG PO TABS: 5.0000 mg | ORAL_TABLET | Freq: Every day | ORAL | 0 refills | Status: DC

## 2019-12-12 MED ORDER — GABAPENTIN 300 MG PO CAPS: 300.0000 mg | ORAL_CAPSULE | Freq: Three times a day (TID) | ORAL | 0 refills | Status: DC

## 2019-12-12 MED ORDER — MIRTAZAPINE 15 MG PO TABS: 15.0000 mg | ORAL_TABLET | Freq: Every day | ORAL | 0 refills | Status: DC

## 2019-12-12 MED ORDER — TRAZODONE HCL 50 MG PO TABS: 50.0000 mg | ORAL_TABLET | Freq: Every evening | ORAL | 0 refills | Status: DC | PRN

## 2019-12-12 NOTE — Tx Team (Signed)
Interdisciplinary Treatment and Diagnostic Plan Update  12/12/2019 Time of Session: 9:10am Kimberly Harper MRN: [id number]  Principal Diagnosis: Bipolar 2 disorder, major depressive episode ([place])  Secondary Diagnoses: Principal Problem:   Bipolar 2 disorder, major depressive episode ([place])   Current Medications:  Current Facility-Administered Medications  Medication Dose Route Frequency Provider Last Rate Last Admin  . acetaminophen (TYLENOL) tablet 650 mg  650 mg Oral Q6H PRN Lindon Romp A, NP      . alum & mag hydroxide-simeth (MAALOX/MYLANTA) 200-200-20 MG/5ML suspension 30 mL  30 mL Oral Q4H PRN Lindon Romp A, NP      . ARIPiprazole (ABILIFY) tablet 5 mg  5 mg Oral Daily Money, Lowry Ram, FNP   5 mg at 12/12/19 2952  . busPIRone (BUSPAR) tablet 5 mg  5 mg Oral TID Sharma Covert, MD   5 mg at 12/12/19 8413  . gabapentin (NEURONTIN) capsule 300 mg  300 mg Oral TID Connye Burkitt, NP   300 mg at 12/12/19 2440  . hydrocortisone cream 1 %   Topical BID PRN Connye Burkitt, NP   Given at 12/09/19 1525  . hydrOXYzine (ATARAX/VISTARIL) tablet 25 mg  25 mg Oral TID PRN Rozetta Nunnery, NP   25 mg at 12/07/19 2105  . magnesium hydroxide (MILK OF MAGNESIA) suspension 30 mL  30 mL Oral Daily PRN Lindon Romp A, NP      . metroNIDAZOLE (FLAGYL) tablet 500 mg  500 mg Oral Q12H Sharma Covert, MD   500 mg at 12/12/19 1027  . mirtazapine (REMERON) tablet 15 mg  15 mg Oral QHS Connye Burkitt, NP   15 mg at 12/10/19 2117  . traZODone (DESYREL) tablet 50 mg  50 mg Oral QHS PRN Rozetta Nunnery, NP       PTA Medications: Medications Prior to Admission  Medication Sig Dispense Refill Last Dose  . aspirin-acetaminophen-caffeine (EXCEDRIN MIGRAINE) 250-250-65 MG tablet Take by mouth every 6 (six) hours as needed for headache.     . busPIRone (BUSPAR) 10 MG tablet Take 10 mg by mouth 3 (three) times daily.     . citalopram (CELEXA) 20 MG tablet Take 20 mg by mouth daily.     .  clotrimazole-betamethasone (LOTRISONE) cream Apply 1 application topically 2 (two) times daily. Twice daily to affected area for 7 days     . metroNIDAZOLE (FLAGYL) 500 MG tablet Take 1 tablet (500 mg total) by mouth 2 (two) times daily. 14 tablet 0 12/06/2019 at Unknown time  . mirtazapine (REMERON) 45 MG tablet Take 45 mg by mouth at bedtime.     . propranolol (INDERAL) 20 MG tablet Take 20 mg by mouth 3 (three) times daily as needed (for anxiety/migraine).     . QUEtiapine (SEROQUEL) 50 MG tablet Take 50 mg by mouth at bedtime.     . topiramate (TOPAMAX) 25 MG tablet Take 50 mg by mouth 2 (two) times daily.     . [DISCONTINUED] gabapentin (NEURONTIN) 300 MG capsule Take 300 mg by mouth 3 (three) times daily.       Patient Stressors: Financial difficulties Health problems Marital or family conflict Occupational concerns  Patient Strengths: Active sense of humor Average or above average intelligence Capable of independent living Communication skills Work skills  Treatment Modalities: Medication Management, Group therapy, Case management,  1 to 1 session with clinician, Psychoeducation, Recreational therapy.   Physician Treatment Plan for Primary Diagnosis: Bipolar 2 disorder, major depressive episode ([place]) Long  Term Goal(s): Improvement in symptoms so as ready for discharge Improvement in symptoms so as ready for discharge   Short Term Goals: Ability to identify changes in lifestyle to reduce recurrence of condition will improve Ability to verbalize feelings will improve Ability to disclose and discuss suicidal ideas Ability to demonstrate self-control will improve Ability to identify and develop effective coping behaviors will improve Ability to maintain clinical measurements within normal limits will improve Compliance with prescribed medications will improve Ability to identify triggers associated with substance abuse/mental health issues will improve Ability to identify  changes in lifestyle to reduce recurrence of condition will improve Ability to verbalize feelings will improve Ability to disclose and discuss suicidal ideas Ability to demonstrate self-control will improve Ability to identify and develop effective coping behaviors will improve Ability to maintain clinical measurements within normal limits will improve Compliance with prescribed medications will improve Ability to identify triggers associated with substance abuse/mental health issues will improve  Medication Management: Evaluate patient's response, side effects, and tolerance of medication regimen.  Therapeutic Interventions: 1 to 1 sessions, Unit Group sessions and Medication administration.  Evaluation of Outcomes: Adequate for Discharge  Physician Treatment Plan for Secondary Diagnosis: Principal Problem:   Bipolar 2 disorder, major depressive episode (HCC)  Long Term Goal(s): Improvement in symptoms so as ready for discharge Improvement in symptoms so as ready for discharge   Short Term Goals: Ability to identify changes in lifestyle to reduce recurrence of condition will improve Ability to verbalize feelings will improve Ability to disclose and discuss suicidal ideas Ability to demonstrate self-control will improve Ability to identify and develop effective coping behaviors will improve Ability to maintain clinical measurements within normal limits will improve Compliance with prescribed medications will improve Ability to identify triggers associated with substance abuse/mental health issues will improve Ability to identify changes in lifestyle to reduce recurrence of condition will improve Ability to verbalize feelings will improve Ability to disclose and discuss suicidal ideas Ability to demonstrate self-control will improve Ability to identify and develop effective coping behaviors will improve Ability to maintain clinical measurements within normal limits will  improve Compliance with prescribed medications will improve Ability to identify triggers associated with substance abuse/mental health issues will improve     Medication Management: Evaluate patient's response, side effects, and tolerance of medication regimen.  Therapeutic Interventions: 1 to 1 sessions, Unit Group sessions and Medication administration.  Evaluation of Outcomes: Adequate for Discharge   RN Treatment Plan for Primary Diagnosis: Bipolar 2 disorder, major depressive episode (HCC) Long Term Goal(s): Knowledge of disease and therapeutic regimen to maintain health will improve  Short Term Goals: Ability to participate in decision making will improve, Ability to verbalize feelings will improve and Ability to disclose and discuss suicidal ideas  Medication Management: RN will administer medications as ordered by provider, will assess and evaluate patient's response and provide education to patient for prescribed medication. RN will report any adverse and/or side effects to prescribing provider.  Therapeutic Interventions: 1 on 1 counseling sessions, Psychoeducation, Medication administration, Evaluate responses to treatment, Monitor vital signs and CBGs as ordered, Perform/monitor CIWA, COWS, AIMS and Fall Risk screenings as ordered, Perform wound care treatments as ordered.  Evaluation of Outcomes: Adequate for Discharge   LCSW Treatment Plan for Primary Diagnosis: Bipolar 2 disorder, major depressive episode (HCC) Long Term Goal(s): Safe transition to appropriate next level of care at discharge, Engage patient in therapeutic group addressing interpersonal concerns.  Short Term Goals: Engage patient in aftercare planning with referrals  and resources and Increase social support  Therapeutic Interventions: Assess for all discharge needs, 1 to 1 time with Child psychotherapist, Explore available resources and support systems, Assess for adequacy in community support network, Educate  family and significant other(s) on suicide prevention, Complete Psychosocial Assessment, Interpersonal group therapy.  Evaluation of Outcomes: Adequate for Discharge   Progress in Treatment: Attending groups: Yes. Participating in groups: Yes. Taking medication as prescribed: Yes. Toleration medication: Yes. Family/Significant other contact made: No, will contact:  patient declined consent for collateral contacts Patient understands diagnosis: Yes. Discussing patient identified problems/goals with staff: Yes. Medical problems stabilized or resolved: Yes. Denies suicidal/homicidal ideation: Yes. Issues/concerns per patient self-inventory: No. Other:   New problem(s) identified: None   New Short Term/Long Term Goal(s): medication stabilization, elimination of SI thoughts, development of comprehensive mental wellness plan.    Patient Goals: "I want a therapist, a psychiatrist, whatever I told the lady yesterday"     Discharge Plan or Barriers: Starts PHP through [facility] on 05/28   Reason for Continuation of Hospitalization: Anxiety Depression  Estimated Length of Stay: discharging today  Attendees: Patient: Kimberly Harper  12/12/2019 9:27 AM  Physician:  12/12/2019 9:27 AM  Nursing:  12/12/2019 9:27 AM  RN Care Manager: 12/12/2019 9:27 AM  Social Worker: Baldo Daub, LCSW 12/12/2019 9:27 AM  Recreational Therapist:  12/12/2019 9:27 AM  Other: Marciano Sequin, NP 12/12/2019 9:27 AM  Other:  12/12/2019 9:27 AM  Other: 12/12/2019 9:27 AM    Scribe for Treatment Team: Darreld Mclean, LCSWA 12/12/2019 9:27 AM

## 2019-12-12 NOTE — Progress Notes (Signed)
Recreation Therapy Notes  Date:  5.21.21 Time: 0930 Location: [address] Group Room  Group Topic: Stress Management  Goal Area(s) Addresses:  Patient will identify positive stress management techniques. Patient will identify benefits of using stress management post d/c.  Behavioral Response:  Engaged  Intervention: Stress Management  Activity:  Meditation.  LRT played a meditation that focused on being resilient in the face of adversity.  Patients were to listen and follow along as meditation played to participate.    Education:  Stress Management, Discharge Planning.   Education Outcome: Acknowledges Education  Clinical Observations/Feedback: Pt attended and participated in activity.     Caroll Rancher, LRT/CTRS    Caroll Rancher A 12/12/2019 11:27 AM

## 2019-12-12 NOTE — Discharge Instructions (Signed)
A WebEx link will be emailed to you for your PHP session. If you do not receive an email from your outpatient provider by the day of your appointment, please call office and ask to speak with Milana Na or Donia Guiles in [facility].

## 2019-12-12 NOTE — BHH Suicide Risk Assessment (Signed)
[facility] Discharge Suicide Risk Assessment   Principal Problem: Bipolar 2 disorder, major depressive episode (HCC) Discharge Diagnoses: Principal Problem:   Bipolar 2 disorder, major depressive episode (HCC)   Total Time spent with patient: 15 minutes  Musculoskeletal: Strength & Muscle Tone: within normal limits Gait & Station: normal Patient leans: N/A  Psychiatric Specialty Exam: Review of Systems  All other systems reviewed and are negative.   Blood pressure 111/86, pulse 84, temperature 98.3 F (36.8 C), temperature source Oral, resp. rate 20, height 5\' 5"  (1.651 m), weight 72.1 kg, last menstrual period 11/13/2019, SpO2 100 %.Body mass index is 26.46 kg/m.  General Appearance: Casual  Eye Contact::  Good  Speech:  Normal Rate409  Volume:  Normal  Mood:  Euthymic  Affect:  Congruent  Thought Process:  Coherent and Descriptions of Associations: Intact  Orientation:  Full (Time, Place, and Person)  Thought Content:  Logical  Suicidal Thoughts:  No  Homicidal Thoughts:  No  Memory:  Immediate;   Good Recent;   Good Remote;   Good  Judgement:  Intact  Insight:  Fair  Psychomotor Activity:  Normal  Concentration:  Good  Recall:  Good  Fund of Knowledge:Good  Language: Good  Akathisia:  Negative  Handed:  Right  AIMS (if indicated):     Assets:  Communication Skills Desire for Improvement Housing Resilience Social Support  Sleep:  Number of Hours: 5.75  Cognition: WNL  ADL's:  Intact   Mental Status Per Nursing Assessment::   On Admission:  Suicidal ideation indicated by patient, Self-harm behaviors, Self-harm thoughts  Demographic Factors:  Low socioeconomic status and Unemployed  Loss Factors: Financial problems/change in socioeconomic status  Historical Factors: Impulsivity  Risk Reduction Factors:   Sense of responsibility to family, Living with another person, especially a relative, Positive social support and Positive coping skills or problem  solving skills  Continued Clinical Symptoms:  Severe Anxiety and/or Agitation Depression:   Impulsivity  Cognitive Features That Contribute To Risk:  None    Suicide Risk:  Minimal: No identifiable suicidal ideation.  Patients presenting with no risk factors but with morbid ruminations; may be classified as minimal risk based on the severity of the depressive symptoms  Follow-up Information    BEHAVIORAL HEALTH PARTIAL HOSPITALIZATION PROGRAM Follow up on 12/19/2019.   Specialty: Behavioral Health Why: You initial appointment for PHP services is Friday, 12/19/2019 at 10:00am. This appointment will be held virtually. Please be sure to have any discharge paperwork from this hospitalization, available.  Contact information: [address] [place] [phone]          Plan Of Care/Follow-up recommendations:  Activity:  ad lib  [phone], MD 12/12/2019, 9:24 AM

## 2019-12-12 NOTE — Progress Notes (Signed)
  [facility] Adult Case Management Discharge Plan :  Will you be returning to the same living situation after discharge:  Yes,  patient is returning home with her mother  At discharge, do you have transportation home?: Yes,  patient's car is in [facility] parking lot Do you have the ability to pay for your medications: Yes,  [facility]  Release of information consent forms completed and in the chart;  Patient's signature needed at discharge.  Patient to Follow up at: Follow-up Information    BEHAVIORAL HEALTH PARTIAL HOSPITALIZATION PROGRAM Follow up on 12/19/2019.   Specialty: Behavioral Health Why: You initial appointment for PHP services is Friday, 12/19/2019 at 10:00am. This appointment will be held virtually. Please be sure to have any discharge paperwork from this hospitalization, available.  Contact information: [address] [id number] mc [place] [address] [phone]          Next level of care provider has access to [facility] Link:yes  Safety Planning and Suicide Prevention discussed: Yes,  with the patient  Have you used any form of tobacco in the last 30 days? (Cigarettes, Smokeless Tobacco, Cigars, and/or Pipes): No  Has patient been referred to the Quitline?: N/A patient is not a smoker  Patient has been referred for addiction treatment: N/A  Maeola Sarah, LCSWA 12/12/2019, 9:12 AM

## 2019-12-12 NOTE — Progress Notes (Signed)
D: Pt A & O X 4. Denies SI, HI, AVH and pain at this time. D/C home as ordered. Drove herself home as her car was in the parking lot.  A: D/C instructions reviewed with pt including prescriptions and follow up appointment; compliance encouraged. All belongings from locker 28 returned to pt at time of departure. Scheduled medications administered with verbal education and effects monitored. Safety checks maintained without incident till time of d/c.  R: Pt receptive to care. Compliant with medications when offered. Denies adverse drug reactions when assessed. Verbalized understanding related to d/c instructions. Signed belonging sheet in agreement with items received from locker. Ambulatory with a steady gait. Appears to be in no physical distress at time of departure.

## 2019-12-12 NOTE — Discharge Summary (Signed)
Physician Discharge Summary Note  Patient:  Kimberly Harper is an 34 y.o., female MRN:  [id number] DOB:  07-18-1986 Patient phone:  [phone] (home)  Patient address:   [address] [place] [address],  Total Time spent with patient: 30 minutes  Date of Admission:  12/06/2019 Date of Discharge: 12/12/19  Reason for Admission:  34 year old female with a probable past psychiatric history significant for anxiety, depression, posttraumatic stress disorder and probable borderline personality disorder who presented to the [facility] behavioral health hospital as a direct evaluation. The patient stated that she has been having mood instability recently, and describes laughing at 1 moment crying the next. She stated that she had taken approximately 35 Topamax tablets a week ago in addition to alcohol in a suicide attempt.   Principal Problem: Bipolar 2 disorder, major depressive episode [facility]) Discharge Diagnoses: Principal Problem:   Bipolar 2 disorder, major depressive episode [facility])   Past Psychiatric History: Patient stated that she had been hospitalized proximately every year since age 30 or 61.  She stated there may have been 1 or 2 years where she was able to avoid hospitalization.  These hospitalizations apparently were all in [place].  She stated she has been treated with multiple medications in the past, none have been successful.  She stated that most recently she was seen at [facility] in [place].  He stated they were not helpful, and the medications they recommended she was never able to get because "they had to be prior authorized".  Recommendations were for BuSpar and Neurontin.  Past Medical History:  Past Medical History:  Diagnosis Date  . Anxiety   . Bipolar 1 disorder (HCC)   . Major depressive disorder     Past Surgical History:  Procedure Laterality Date  . CESAREAN SECTION    . DILATION AND CURETTAGE OF UTERUS     Family History:  Family  History  Problem Relation Age of Onset  . Hypertension Mother   . Diabetes Mother   . Stroke Mother    Family Psychiatric  History: She has multiple family members with multiple different disorders including bipolar disorder, depression, anxiety and schizophrenia Social History:  Social History   Substance and Sexual Activity  Alcohol Use Yes   Comment: socially     Social History   Substance and Sexual Activity  Drug Use Never    Social History   Socioeconomic History  . Marital status: Single    Spouse name: Not on file  . Number of children: Not on file  . Years of education: Not on file  . Highest education level: Not on file  Occupational History  . Not on file  Tobacco Use  . Smoking status: Never Smoker  . Smokeless tobacco: Never Used  Substance and Sexual Activity  . Alcohol use: Yes    Comment: socially  . Drug use: Never  . Sexual activity: Not Currently    Birth control/protection: Abstinence  Other Topics Concern  . Not on file  Social History Narrative  . Not on file   Social Determinants of Health   Financial Resource Strain:   . Difficulty of Paying Living Expenses:   Food Insecurity:   . Worried About Programme researcher, broadcasting/film/video in the Last Year:   . Barista in the Last Year:   Transportation Needs:   . Freight forwarder (Medical):   Marland Kitchen Lack of Transportation (Non-Medical):   Physical Activity:   . Days  of Exercise per Week:   . Minutes of Exercise per Session:   Stress:   . Feeling of Stress :   Social Connections:   . Frequency of Communication with Friends and Family:   . Frequency of Social Gatherings with Friends and Family:   . Attends Religious Services:   . Active Member of Clubs or Organizations:   . Attends Banker Meetings:   Marland Kitchen Marital Status:     Hospital Course:  Patient remained on the [facility] unit for 5 days. The patient stabilized on medication and therapy. Patient was discharged on Abilify 5 mg Daily,  Buspar 5 mg TID, Neurontin 300 mg TID, Remeron 15 mg QHS, and Trazodone 50 mg QHS PRN. Patient has shown improvement with improved mood, affect, sleep, appetite, and interaction. Patient has attended group and participated. Patient has been seen in the day room interacting with peers and staff appropriately. Patient denies any SI/HI/AVH and contracts for safety. Patient agrees to follow up at [facility] Hospitalization Program. Patient is provided with prescriptions for their medications upon discharge.  Physical Findings: AIMS: Facial and Oral Movements Muscles of Facial Expression: None, normal Lips and Perioral Area: None, normal Jaw: None, normal Tongue: None, normal,Extremity Movements Upper (arms, wrists, hands, fingers): None, normal Lower (legs, knees, ankles, toes): None, normal, Trunk Movements Neck, shoulders, hips: None, normal, Overall Severity Severity of abnormal movements (highest score from questions above): None, normal Incapacitation due to abnormal movements: None, normal Patient's awareness of abnormal movements (rate only patient's report): No Awareness, Dental Status Current problems with teeth and/or dentures?: No Does patient usually wear dentures?: No  CIWA:    COWS:     Musculoskeletal: Strength & Muscle Tone: within normal limits Gait & Station: normal Patient leans: N/A  Psychiatric Specialty Exam: Physical Exam  Nursing note and vitals reviewed. Constitutional: She is oriented to person, place, and time. She appears well-developed and well-nourished.  Cardiovascular: Normal rate.  Respiratory: Effort normal.  Musculoskeletal:        General: Normal range of motion.  Neurological: She is alert and oriented to person, place, and time.  Skin: Skin is warm.    Review of Systems  Constitutional: Negative.   HENT: Negative.   Eyes: Negative.   Respiratory: Negative.   Cardiovascular: Negative.   Gastrointestinal: Negative.   Genitourinary: Negative.    Musculoskeletal: Negative.   Skin: Negative.   Neurological: Negative.   Psychiatric/Behavioral: Negative.     Blood pressure 111/86, pulse 84, temperature 98.3 F (36.8 C), temperature source Oral, resp. rate 20, height 5\' 5"  (1.651 m), weight 72.1 kg, last menstrual period 11/13/2019, SpO2 100 %.Body mass index is 26.46 kg/m.   General Appearance: Casual  Eye Contact::  Good  Speech:  Normal Rate409  Volume:  Normal  Mood:  Euthymic  Affect:  Congruent  Thought Process:  Coherent and Descriptions of Associations: Intact  Orientation:  Full (Time, Place, and Person)  Thought Content:  Logical  Suicidal Thoughts:  No  Homicidal Thoughts:  No  Memory:  Immediate;   Good Recent;   Good Remote;   Good  Judgement:  Intact  Insight:  Fair  Psychomotor Activity:  Normal  Concentration:  Good  Recall:  Good  Fund of Knowledge:Good  Language: Good  Akathisia:  Negative  Handed:  Right  AIMS (if indicated):     Assets:  Communication Skills Desire for Improvement Housing Resilience Social Support  Sleep:  Number of Hours: 5.75  Cognition: WNL  ADL's:  Intact   Have you used any form of tobacco in the last 30 days? (Cigarettes, Smokeless Tobacco, Cigars, and/or Pipes): No  Has this patient used any form of tobacco in the last 30 days? (Cigarettes, Smokeless Tobacco, Cigars, and/or Pipes) Yes, No  Blood Alcohol level:  No results found for: [facility]  Metabolic Disorder Labs:  Lab Results  Component Value Date   HGBA1C 5.3 12/07/2019   MPG 105 12/07/2019   No results found for: PROLACTIN Lab Results  Component Value Date   CHOL 189 12/07/2019   TRIG 47 12/07/2019   HDL 39 (L) 12/07/2019   CHOLHDL 4.8 12/07/2019   VLDL 9 12/07/2019   LDLCALC 141 (H) 12/07/2019    See Psychiatric Specialty Exam and Suicide Risk Assessment completed by Attending Physician prior to discharge.  Discharge destination:  Home  Is patient on multiple antipsychotic therapies at discharge:   No   Has Patient had three or more failed trials of antipsychotic monotherapy by history:  No  Recommended Plan for Multiple Antipsychotic Therapies: NA   Allergies as of 12/12/2019      Reactions   Multiple Vitamins-iron Swelling   States that vitamins and iron have caused swelling of tongue and gums      Medication List    STOP taking these medications   aspirin-acetaminophen-caffeine 250-250-65 MG tablet Commonly known as: EXCEDRIN MIGRAINE   citalopram 20 MG tablet Commonly known as: CELEXA   clotrimazole-betamethasone cream Commonly known as: LOTRISONE   metroNIDAZOLE 500 MG tablet Commonly known as: FLAGYL   propranolol 20 MG tablet Commonly known as: INDERAL   QUEtiapine 50 MG tablet Commonly known as: SEROQUEL   topiramate 25 MG tablet Commonly known as: TOPAMAX     TAKE these medications     Indication  ARIPiprazole 5 MG tablet Commonly known as: ABILIFY Take 1 tablet (5 mg total) by mouth daily. Start taking on: Dec 13, 2019  Indication: bipolar disorder   busPIRone 5 MG tablet Commonly known as: BUSPAR Take 1 tablet (5 mg total) by mouth 3 (three) times daily. What changed:   medication strength  how much to take  Indication: Anxiety Disorder   gabapentin 300 MG capsule Commonly known as: NEURONTIN Take 1 capsule (300 mg total) by mouth 3 (three) times daily.  Indication: mood stability   hydrOXYzine 25 MG tablet Commonly known as: ATARAX/VISTARIL Take 1 tablet (25 mg total) by mouth 3 (three) times daily as needed for anxiety.  Indication: Feeling Anxious   mirtazapine 15 MG tablet Commonly known as: REMERON Take 1 tablet (15 mg total) by mouth at bedtime. What changed:   medication strength  how much to take  Indication: Major Depressive Disorder, sleep   traZODone 50 MG tablet Commonly known as: DESYREL Take 1 tablet (50 mg total) by mouth at bedtime as needed for sleep.  Indication: Trouble Sleeping      Follow-up  Information    BEHAVIORAL HEALTH PARTIAL HOSPITALIZATION PROGRAM Follow up on 12/19/2019.   Specialty: Behavioral Health Why: You initial appointment for PHP services is Friday, 12/19/2019 at 10:00am. This appointment will be held virtually. Please be sure to have any discharge paperwork from this hospitalization, available.  Contact information: [address] [id number] mc [place] [address] [phone]          Follow-up recommendations:  Continue activity as tolerated. Continue diet as recommended by your PCP. Ensure to keep all appointments with outpatient providers.  Comments:  Patient is  instructed prior to discharge to: Take all medications as prescribed by his/her mental healthcare provider. Report any adverse effects and or reactions from the medicines to his/her outpatient provider promptly. Patient has been instructed & cautioned: To not engage in alcohol and or illegal drug use while on prescription medicines. In the event of worsening symptoms, patient is instructed to call the crisis hotline, 911 and or go to the nearest ED for appropriate evaluation and treatment of symptoms. To follow-up with his/her primary care provider for your other medical issues, concerns and or health care needs.    Signed: Lowry Ram Kavir Savoca, FNP 12/12/2019, 9:21 AM

## 2019-12-19 ENCOUNTER — Other Ambulatory Visit: Payer: Self-pay

## 2019-12-19 ENCOUNTER — Other Ambulatory Visit (HOSPITAL_COMMUNITY): Payer: 59 | Attending: Psychiatry | Admitting: Licensed Clinical Social Worker

## 2019-12-19 DIAGNOSIS — F3181 Bipolar II disorder: Secondary | ICD-10-CM | POA: Insufficient documentation
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
## 2019-12-24 ENCOUNTER — Other Ambulatory Visit (HOSPITAL_COMMUNITY): Payer: 59 | Attending: Psychiatry | Admitting: Occupational Therapy

## 2019-12-24 ENCOUNTER — Encounter (HOSPITAL_COMMUNITY): Payer: Self-pay

## 2019-12-24 ENCOUNTER — Other Ambulatory Visit: Payer: Self-pay

## 2019-12-24 ENCOUNTER — Other Ambulatory Visit (HOSPITAL_COMMUNITY): Payer: 59 | Admitting: Licensed Clinical Social Worker

## 2019-12-24 DIAGNOSIS — Z823 Family history of stroke: Secondary | ICD-10-CM | POA: Diagnosis not present

## 2019-12-24 DIAGNOSIS — Z638 Other specified problems related to primary support group: Secondary | ICD-10-CM | POA: Diagnosis not present

## 2019-12-24 DIAGNOSIS — F3181 Bipolar II disorder: Secondary | ICD-10-CM | POA: Diagnosis present

## 2019-12-24 DIAGNOSIS — F314 Bipolar disorder, current episode depressed, severe, without psychotic features: Secondary | ICD-10-CM | POA: Diagnosis not present

## 2019-12-24 DIAGNOSIS — Z888 Allergy status to other drugs, medicaments and biological substances status: Secondary | ICD-10-CM | POA: Diagnosis not present

## 2019-12-24 DIAGNOSIS — Z833 Family history of diabetes mellitus: Secondary | ICD-10-CM | POA: Insufficient documentation

## 2019-12-24 DIAGNOSIS — R4589 Other symptoms and signs involving emotional state: Secondary | ICD-10-CM

## 2019-12-24 DIAGNOSIS — Z8249 Family history of ischemic heart disease and other diseases of the circulatory system: Secondary | ICD-10-CM | POA: Insufficient documentation

## 2019-12-24 DIAGNOSIS — Z79899 Other long term (current) drug therapy: Secondary | ICD-10-CM | POA: Diagnosis not present

## 2019-12-24 DIAGNOSIS — F509 Eating disorder, unspecified: Secondary | ICD-10-CM | POA: Diagnosis not present

## 2019-12-24 DIAGNOSIS — F419 Anxiety disorder, unspecified: Secondary | ICD-10-CM | POA: Insufficient documentation

## 2019-12-24 DIAGNOSIS — R41844 Frontal lobe and executive function deficit: Secondary | ICD-10-CM | POA: Diagnosis not present

## 2019-12-24 NOTE — Therapy (Addendum)
[place], [place], [address] Phone: [phone]   Fax:  [phone] Virtual Visit via Video Note  I connected with Kimberly Harper on 12/24/19 at  10:00 AM EDT by a video enabled telemedicine application and verified that I am speaking with the correct person using two identifiers.   I discussed the limitations of evaluation and management by telemedicine and the availability of in person appointments. The patient expressed understanding and agreed to proceed.   I discussed the assessment and treatment plan with the patient. The patient was provided an opportunity to ask questions and all were answered. The patient agreed with the plan and demonstrated an understanding of the instructions.   The patient was advised to call back or seek an in-person evaluation if the symptoms worsen or if the condition fails to improve as anticipated.  I provided 90 minutes of non-face-to-face time during this encounter.    Occupational Therapy Evaluation  Patient Details  Name: Kimberly Harper MRN: [id number] Date of Birth: 14-Jan-1986 Referring Provider (OT): Ricky Ala   Encounter Date: 12/24/2019  OT End of Session - 12/24/19 1456    Visit Number  1    Number of Visits  20    Date for OT Re-Evaluation  01/21/20    Authorization Type  no insurance on file    OT Start Time  1000    OT Stop Time  1100   OT Eval 130-200   OT Time Calculation (min)  60 min    Activity Tolerance  Patient tolerated treatment well    Behavior During Therapy  [facility] for tasks assessed/performed       Past Medical History:  Diagnosis Date  . Anxiety   . Bipolar 1 disorder ([place])   . Major depressive disorder     Past Surgical History:  Procedure Laterality Date  . CESAREAN SECTION    . DILATION AND CURETTAGE OF UTERUS      There were no vitals filed for this visit.  Subjective Assessment - 12/24/19 1453    Currently in Pain?  No/denies    Pain Score  0-No pain        [facility] OT Assessment - 12/24/19 0001      Assessment   Medical Diagnosis  Bipolar II Disorder    Referring Provider (OT)  Ricky Ala      Precautions   Precautions  None      Balance Screen   Has the patient fallen in the past 6 months  No    Has the patient had a decrease in activity level because of a fear of falling?   No    Is the patient reluctant to leave their home because of a fear of falling?   No         OT Education - 12/24/19 1454    Education Details  Educated on OT within [employer], along with education on communication styles and use of XYZ assertive communication tool    Person(s) Educated  Patient    Methods  Explanation    Comprehension  Verbalized understanding       OT Short Term Goals - 12/24/19 1500      OT SHORT TERM GOAL #1   Title  Pt will actively engage in OT group sessions throughout duration of PHP programming, in order to promote daily structure, social engagement, and opportunities to develop and utilize adaptive strategies to maximize functional performance  in preparation for safe transition and integration back into school, work, and the community.    Time  4    Period  Weeks    Status  New    [employer] Date  01/21/20      OT SHORT TERM GOAL #2   Title  Pt will practice and identify 1-3 stress management strategies she can utilize, in order to safely manage mood fluctuation, with min cues, in preparation for safe and healthy reintegration back into the community at discharge.    Status  New      OT SHORT TERM GOAL #3   Title  Pt will demonstrate an increase in assertive communication skills, as evidenced by, identifying at least 2 "I" statements when sharing their feelings/needs, with min cues, in preparation for reintegration back into the community at discharge.    Status  New      OT SHORT TERM GOAL #4   Title  Pt will develop an exercise plan, with a minimum of 2  structured exercise activities, in order to improve overall health and wellness, in preparation for transition back to community.    Status  New     Group Session:  S: "It depends on what situation I'm in to determine how I communicate."  O: Group began with a reflection from previous OT session on sleep hygiene. Group members shared their reflections and progress on whether or not they implemented a new sleep hygiene tip or strategy into their routine. Today's group focused on topic of Communication Styles. Group members were educated on the different styles including passive, aggressive, and assertive communication. Members shared and reflected on which style they most often find themselves communicating in and how to transition to a more assertive approach. Benefits and drawbacks of each communication style were discussed and use of the XYZ assertive communication tool was introduced. The XYZ communication tool states: I feel X when you do Y in situation Z and I would like _________. X is the emotion, Y is the specific behavior, and Z is the specific situation. Group members each formulated their own XYZ statement and shared with the group to discuss and offer feedback.    A: Kimberly Harper was active in her participation of discussion and reflected that her communication style is dependent upon the situation she is, though recognized need to be less aggressive and more assertive. Pt also noted that she finds herself being passive in situations she might normally communicate in aggressively. Pt identified her Mom as a source of conflict when it comes to communicating effectively and utilized this situation to create her XYZ statement: "I feel annoyed when you focus on the negative all the time and I would like you to stop." With minimal cues, pt recognized that she could identify another solution to just "stop" and recognized need to use "I" statements in place of "you" statements. Appeared both receptive and  motivated to practice XYZ assertive statements moving forward.  P: Continue to attend PHP OT group sessions 5x week for 4 weeks to promote daily structure, social engagement, and opportunities to develop and utilize adaptive strategies to maximize functional performance in preparation for safe transition and integration back into school, work, and the community. Plan to address topic of assertive communication and active listening in next OT group session.  OT Assessment:  Diagnosis: Bipolar II Disorder Past medical history/referral information: recent [facility] admission, [facility] referral  Living situation: Pt currently lives with Mom, 92 y/o son, and dog, [place] ADLs:  Pt identified difficulty engaging in a structured routine and prefers to eat out rather than cook at home Work: Pt is currently on short-term disability, though is motivated to get back to her job at [employer]. 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 Identified difficulty exploring any other interests/hobbies. Social support: Lives with Mom and son, though difficulty communicating with both Struggles: See above OT goal: See above  [place] Summary of Client Scores:  Facilitates participation in occupation Allows participation in occupation Inhibits participation in occupation Restricts participation in occupation Comments:  Roles   X  Mother, daughter, coworker, business owner  Habits    X Does not have a routine or current schedule that she follows  Personal Causation   X  Identifies being most proud of her work and creative outlets (Social worker) Identifies difficulty consistently taking her medication and following up with treatment  Values   X  Justice, respect  Interests   X  Expanding her business, eating out/exploring new  restaurants  Skills   X  Reports lack of motivation  Short-Term Goals   X  Makes to-do lists and STG that are often achievable, though takes it day by day with difficulty thinking long term   Long-term Goals   X  ^  Interpretation of Past Experiences   X  Identifies best period of life was before 2015, worst period 2015 up until now due to mental health diagnosis and difficulty managing  Physical Environment  X     Social Environment   X  Prefers to be alone, noted difficulty communicating with Mom and son  Readiness for Change   X  Identifies being easily able to adapt to change, both big and small     Need for Occupational Therapy:  4 Shows positive occupational participation, no need for OT.   3 Need for minimal intervention/consultative participation  X 2 Need for OT intervention indicated to restore/improve participation   1 Need for extensive OT intervention indicated to improve participation.  Referral for follow up services also recommended.   Assessment:  Patient demonstrates behavior that inhibits participation in occupation. Pt will benefit from skilled occupational therapy services in order to address current difficulties with emotion regulation, socialization, stress management, time management, job readiness, financial wellness, health and nutrition, sleep hygiene, and leisure participation, in preparation for reintegration and return to community at discharge.   Plan:  Patient will participate in skilled occupational therapy sessions (group or individual) to promote daily structure, social engagement, and opportunities to develop and utilize adaptive strategies to maximize functional performance in preparation for safe transition and integration back into school, work, and/or the community. OT sessions with occur 4-5 x per week for 3-4 weeks.   Suede [place], MOT, OTR/L  OCCUPATIONAL THERAPY DISCHARGE SUMMARY  Visits from Start of Care: 1  Current functional level related to  goals / functional outcomes: See above   Remaining deficits: See above   Education / Equipment: See above  Plan: Patient agrees to discharge.  Patient goals were not met. Patient is being discharged due to not returning since the last visit.  ?????     Plan - 12/24/19 1457    Clinical Impression Statement  Pt is a 34 y/o female with PMHx of bipolar II disorder with reports of increased  psychosocial stressors including relationships with 22 y/o son and Mom, difficulties with employment (both in creating a business and current position), and managing her mental health. Pt reports desire to engage in [facility] programming in order to manage these identified stressors and engage meaningfully in identified occupations and ADL/iADLs.    OT Occupational Profile and History  Problem Focused Assessment - Including review of records relating to presenting problem    Occupational performance deficits (Please refer to evaluation for details):  ADL's;IADL's;Rest and Sleep;Education;Leisure;Social Participation;Work    Marketing executive / Function / Physical Skills  ADL    Cognitive Skills  Attention;Emotional;Energy/Drive;Problem Solve;Safety Awareness;Temperament/Personality    Psychosocial Skills  Coping Strategies;Habits;Interpersonal Interaction;Routines and Behaviors    Rehab Potential  Good    Clinical Decision Making  Limited treatment options, no task modification necessary    Comorbidities Affecting Occupational Performance:  May have comorbidities impacting occupational performance    Modification or Assistance to Complete Evaluation   No modification of tasks or assist necessary to complete eval    OT Frequency  5x / week    OT Duration  4 weeks    OT Treatment/Interventions  Self-care/ADL training;Psychosocial skills training;Coping strategies training    Consulted and Agree with Plan of Care  Patient       Patient will benefit from skilled therapeutic intervention in order to improve the  following deficits and impairments:   Body Structure / Function / Physical Skills: ADL Cognitive Skills: Attention, Emotional, Energy/Drive, Problem Solve, Safety Awareness, Temperament/Personality Psychosocial Skills: Coping Strategies, Habits, Interpersonal Interaction, Routines and Behaviors   Visit Diagnosis: Difficulty coping  Frontal lobe and executive function deficit  Bipolar 2 disorder, major depressive episode ([place])    Problem List Patient Active Problem List   Diagnosis Date Noted  . Bipolar 2 disorder, major depressive episode ([place]) 12/06/2019    Ponciano Ort, MOT, OTR/L  12/24/2019, 3:05 PM  [facility] PARTIAL HOSPITALIZATION PROGRAM [place], [place], [address] Phone: [phone]   Fax:  [phone]  Name: Kimberly Harper MRN: [id number] Date of Birth: 1986-03-08
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
## 2019-12-25 ENCOUNTER — Other Ambulatory Visit: Payer: Self-pay

## 2019-12-25 ENCOUNTER — Other Ambulatory Visit (HOSPITAL_COMMUNITY): Payer: 59

## 2019-12-25 ENCOUNTER — Telehealth (HOSPITAL_COMMUNITY): Payer: Self-pay | Admitting: Professional

## 2019-12-25 NOTE — Progress Notes (Signed)
Spiritual care group   12/24/2019 11:00-12:00  Group met via web-ex due to COVID-19 precautions.  Group facilitated by Simone Curia, MDiv, BCC   Group focused on topic of "self-care"  Patients engaged in facilitated discussion about topic.  Explored quotes related to self care and chose one which they agreed with and one which they disliked.  Engaged in discussion around quote choices and their experience / understanding of care for themselves.

## 2019-12-26 ENCOUNTER — Other Ambulatory Visit (HOSPITAL_COMMUNITY): Payer: 59

## 2019-12-26 ENCOUNTER — Other Ambulatory Visit: Payer: Self-pay

## 2019-12-26 ENCOUNTER — Ambulatory Visit
Admission: EM | Admit: 2019-12-26 | Discharge: 2019-12-26 | Disposition: A | Discharge status: Home / Self Care | Payer: 59 | Attending: Emergency Medicine | Admitting: Emergency Medicine

## 2019-12-26 DIAGNOSIS — N898 Other specified noninflammatory disorders of vagina: Secondary | ICD-10-CM | POA: Insufficient documentation

## 2019-12-26 DIAGNOSIS — Z113 Encounter for screening for infections with a predominantly sexual mode of transmission: Secondary | ICD-10-CM | POA: Insufficient documentation

## 2019-12-26 DIAGNOSIS — Z7251 High risk heterosexual behavior: Secondary | ICD-10-CM | POA: Diagnosis present

## 2019-12-26 LAB — POCT URINE PREGNANCY: Preg Test, Ur: NEGATIVE

## 2019-12-26 NOTE — Discharge Instructions (Addendum)

## 2019-12-26 NOTE — ED Provider Notes (Signed)
[name]    CSN: [id number] Arrival date & time: 12/26/19  1703      History   Chief Complaint Chief Complaint  Patient presents with  . SEXUALLY TRANSMITTED DISEASE    HPI Kimberly Harper is a 34 y.o. female history of bipolar 1 disorder, MDD, anxiety presenting for STI check. Patient endorsing mild vaginal discharge. Denies urinary symptoms such as frequency, urgency, hematuria. No vaginal or pelvic pain, back pain, fever. Patient currently sexually active with 1 female partner, not routinely using condoms. Denies known STI exposure. Patient does endorse history of BV: Requesting testing for that as well. Denies history of HIV, syphilis. LMP 2 weeks ago.   Past Medical History:  Diagnosis Date  . Anxiety   . Bipolar 1 disorder (HCC)   . Major depressive disorder     Patient Active Problem List   Diagnosis Date Noted  . Bipolar 2 disorder, major depressive episode (HCC) 12/06/2019    Past Surgical History:  Procedure Laterality Date  . CESAREAN SECTION    . DILATION AND CURETTAGE OF UTERUS      OB History   No obstetric history on file.      Home Medications    Prior to Admission medications   Medication Sig Start Date End Date Taking? Authorizing Provider  ARIPiprazole (ABILIFY) 5 MG tablet Take 1 tablet (5 mg total) by mouth daily. 12/13/19   Money, Gerlene Burdock, FNP  busPIRone (BUSPAR) 5 MG tablet Take 1 tablet (5 mg total) by mouth 3 (three) times daily. 12/12/19   Money, Gerlene Burdock, FNP  gabapentin (NEURONTIN) 300 MG capsule Take 1 capsule (300 mg total) by mouth 3 (three) times daily. 12/12/19   Money, Gerlene Burdock, FNP  hydrOXYzine (ATARAX/VISTARIL) 25 MG tablet Take 1 tablet (25 mg total) by mouth 3 (three) times daily as needed for anxiety. 12/12/19   Money, Gerlene Burdock, FNP  mirtazapine (REMERON) 15 MG tablet Take 1 tablet (15 mg total) by mouth at bedtime. 12/12/19   Money, Gerlene Burdock, FNP  traZODone (DESYREL) 50 MG tablet Take 1 tablet (50 mg total) by  mouth at bedtime as needed for sleep. 12/12/19   Money, Gerlene Burdock, FNP    Family History Family History  Problem Relation Age of Onset  . Hypertension Mother   . Diabetes Mother   . Stroke Mother     Social History Social History   Tobacco Use  . Smoking status: Never Smoker  . Smokeless tobacco: Never Used  Substance Use Topics  . Alcohol use: Yes    Comment: socially  . Drug use: Never     Allergies   Multiple vitamins-iron   Review of Systems As per HPI   Physical Exam Triage Vital Signs ED Triage Vitals  Enc Vitals Group     BP      Pulse      Resp      Temp      Temp src      SpO2      Weight      Height      Head Circumference      Peak Flow      Pain Score      Pain Loc      Pain Edu?      Excl. in GC?    No data found.  Updated Vital Signs BP (!) 129/93 (BP Location: Left Arm)   Pulse 97   Temp 98.6 F (37 C) (Oral)  Resp 16   LMP 12/12/2019   SpO2 98%   Visual Acuity Right Eye Distance:   Left Eye Distance:   Bilateral Distance:    Right Eye Near:   Left Eye Near:    Bilateral Near:     Physical Exam Constitutional:      General: She is not in acute distress. HENT:     Head: Normocephalic and atraumatic.  Eyes:     General: No scleral icterus.    Pupils: Pupils are equal, round, and reactive to light.  Cardiovascular:     Rate and Rhythm: Normal rate.  Pulmonary:     Effort: Pulmonary effort is normal.  Abdominal:     General: Bowel sounds are normal.     Palpations: Abdomen is soft.     Tenderness: There is no abdominal tenderness. There is no right CVA tenderness, left CVA tenderness or guarding.  Genitourinary:    Comments: Patient declined, self-swab performed Skin:    Coloration: Skin is not jaundiced or pale.  Neurological:     Mental Status: She is alert and oriented to person, place, and time.      UC Treatments / Results  Labs (all labs ordered are listed, but only abnormal results are displayed) Labs  Reviewed  POCT URINE PREGNANCY - Normal  HIV ANTIBODY (ROUTINE TESTING W REFLEX)  RPR  CERVICOVAGINAL ANCILLARY ONLY    EKG   Radiology No results found.  Procedures Procedures (including critical care time)  Medications Ordered in UC Medications - No data to display  Initial Impression / Assessment and Plan / UC Course  I have reviewed the triage vital signs and the nursing notes.  Pertinent labs & imaging results that were available during my care of the patient were reviewed by me and considered in my medical decision making (see chart for details).     Afebrile, nontoxic in office today. Cytology and blood work pending. Will treat if indicated. Return precautions discussed, patient verbalized understanding and is agreeable to plan. Final Clinical Impressions(s) / UC Diagnoses   Final diagnoses:  Screening examination for venereal disease  Unprotected sex  Vaginal discharge     Discharge Instructions     Testing for chlamydia, gonorrhea, trichomonas is pending: please look for these results on the MyChart app/website.  We will notify you if you are positive and outline treatment at that time.  Important to avoid all forms of sexual intercourse (oral, vaginal, anal) with any/all partners for the next 7 days to avoid spreading/reinfecting. Any/all sexual partners should be notified of testing/treatment today.  Return for persistent/worsening symptoms or if you develop fever, abdominal or pelvic pain, blood in your urine, or are re-exposed to an STI.    ED Prescriptions    None     PDMP not reviewed this encounter.   Hall[place], [place], [place] 12/26/19 1744

## 2019-12-26 NOTE — ED Triage Notes (Signed)
Pt requesting a full STD check up. Denies sx's. States also wants follow up from when she had BV a few ago.

## 2019-12-28 ENCOUNTER — Encounter (HOSPITAL_COMMUNITY): Payer: Self-pay | Admitting: Family

## 2019-12-28 LAB — RPR: RPR Ser Ql: NONREACTIVE

## 2019-12-28 LAB — HIV ANTIBODY (ROUTINE TESTING W REFLEX): HIV Screen 4th Generation wRfx: NONREACTIVE

## 2019-12-29 ENCOUNTER — Other Ambulatory Visit: Payer: Self-pay

## 2019-12-29 ENCOUNTER — Other Ambulatory Visit (HOSPITAL_COMMUNITY): Payer: 59

## 2019-12-29 NOTE — Psych (Signed)
Virtual Visit via Telephone Note  I connected with Kimberly Harper on 12/24/19 at  9:00 AM EDT by telephone and verified that I am speaking with the correct person using two identifiers.   I discussed the limitations, risks, security and privacy concerns of performing an evaluation and management service by telephone and the availability of in person appointments. I also discussed with the patient that there may be a patient responsible charge related to this service. The patient expressed understanding and agreed to proceed.   Location: Patient: Patient Home Provider: Clinical Home Office   Follow Up Instructions:    I discussed the assessment and treatment plan with the patient. The patient was provided an opportunity to ask questions and all were answered. The patient agreed with the plan and demonstrated an understanding of the instructions.   The patient was advised to call back or seek an in-person evaluation if the symptoms worsen or if the condition fails to improve as anticipated.  I provided 240 minutes of non-face-to-face time during this encounter.   Quinn Axe, [facility]    [facility] PHP THERAPIST PROGRESS NOTE  Kimberly Harper [id number]  Session Time: 9-1  Participation Level: Active  Behavioral Response: CasualAlertDepressed  Type of Therapy: Group Therapy  Treatment Goals addressed: Coping  Interventions: CBT, DBT, Solution Focused, Strength-based, Supportive and Reframing  Summary: Clinician led check-in regarding current stressors and situation, and review of patient completed daily inventory. Clinician utilized active listening and empathetic response and validated patient emotions. Clinician facilitated processing group on pertinent issues.    Therapist Response: Kimberly Harper is a 34 y.o. female who presents with depression, anxiety, and anger symptoms. Patient arrived within time allowed and reports that she is feeling "spaced  out." Patient rates her mood at a 5 on a scale of 1-10 with 10 being great. Pt reports she is experiencing up and down mood swings. Pt reports she does not feel like she is here and doesn't feel like herself.  Pt is able to process. Patient engaged in discussion.      Session Time: 10:00- 11:00   Participation Level: Active   Behavioral Response: CasualAlertDepressed   Type of Therapy: Group Therapy, OT   Treatment Goals addressed: Coping   Interventions: Psychosocial skills training, Supportive   Summary: Occupational Therapy group   Therapist Response: Patient engaged in group. See OT note.      Session Time: 11:00 -12:00   Participation Level: Active   Behavioral Response: CasualAlertDepressed   Type of Therapy: Group Therapy, psychotherapy   Treatment Goals addressed: Coping   Interventions: Strengths based, reframing, Supportive,    Summary:  Spiritual Care group   Therapist Response: Patient engaged in group. See chaplain note.                  Session Time: 12:00 -1:00   Participation Level: Active   Behavioral Response: CasualAlertDepressed   Type of Therapy: Group therapy   Treatment Goals addressed: Coping   Interventions: CBT; Solution focused; Supportive; Reframing   Summary: 12:00 - 12:50: Cln introduced grounding and skills that can be used to ground self. 12:50 -1:00 Clinician led check-out. Clinician assessed for immediate needs, medication compliance and efficacy, and safety concerns   Therapist Response: 12:00 - 12:50: Pt engaged in activity and reports they can utilize 5-4-3-2-1 skill.  12:50 - 1:00: At check-out, patient rates her mood at an 8 on a scale of 1-10 with 10 being great. Pt states afternoon plans  of resting due to feeling drained. Patient demonstrates some progress as evidenced by participation in first day of group. Patient denies SI/HI/self-harm at the end of group.  Suicidal/Homicidal: Nowithout intent/plan    Plan:  Pt will continue in PHP while working to decrease depression and anxiety symptoms, increase ability to manage symptoms in a healthy manner.  Diagnosis: Bipolar 2 disorder, major depressive episode ([place]) [F31.81]    1. Bipolar 2 disorder, major depressive episode [facility])       Royetta Crochet, [facility] 12/24/2019

## 2019-12-30 ENCOUNTER — Other Ambulatory Visit: Payer: Self-pay

## 2019-12-30 ENCOUNTER — Telehealth (HOSPITAL_COMMUNITY): Payer: Self-pay | Admitting: Psychiatry

## 2019-12-30 ENCOUNTER — Other Ambulatory Visit (HOSPITAL_COMMUNITY): Payer: 59 | Admitting: Family

## 2019-12-30 ENCOUNTER — Encounter (HOSPITAL_COMMUNITY): Payer: Self-pay | Admitting: Family

## 2019-12-30 ENCOUNTER — Other Ambulatory Visit (HOSPITAL_COMMUNITY): Payer: 59

## 2019-12-30 ENCOUNTER — Telehealth (HOSPITAL_COMMUNITY): Payer: Self-pay | Admitting: Professional

## 2019-12-30 LAB — CERVICOVAGINAL ANCILLARY ONLY
Bacterial Vaginitis (gardnerella): NEGATIVE
Candida Glabrata: NEGATIVE
Candida Vaginitis: NEGATIVE
Chlamydia: NEGATIVE
Comment: NEGATIVE
Comment: NEGATIVE
Comment: NEGATIVE
Comment: NEGATIVE
Comment: NEGATIVE
Comment: NORMAL
Neisseria Gonorrhea: NEGATIVE
Trichomonas: NEGATIVE

## 2019-12-30 NOTE — Progress Notes (Unsigned)
Virtual Visit via Video Note  I connected with Kimberly Harper on 12/30/19 at  9:00 AM EDT by a video enabled telemedicine application and verified that I am speaking with the correct person using two identifiers.   I discussed the limitations of evaluation and management by telemedicine and the availability of in person appointments. The patient expressed understanding and agreed to proceed.  History of Present Illness:    Observations/Objective:   Assessment and Plan:   Follow Up Instructions:    I discussed the assessment and treatment plan with the patient. The patient was provided an opportunity to ask questions and all were answered. The patient agreed with the plan and demonstrated an understanding of the instructions.   The patient was advised to call back or seek an in-person evaluation if the symptoms worsen or if the condition fails to improve as anticipated.  I provided 15  minutes of non-face-to-face time during this encounter.   [name], NP   [facility] Partial hospitalization outpatient Program Discharge Summary  Kimberly Harper [id number]  Admission date: 12/24/2019 Discharge date: 12/30/2019  Reason for admission: Per discharge assessment note:  34 year old female with a probable past psychiatric history significant for anxiety, depression, posttraumatic stress disorder and probable borderline personality disorder who presented to the [facility] behavioral health hospital as a direct evaluation. The patient stated that she has been having mood instability recently, and describes laughing at 1 moment crying the next. She stated that she had taken approximately 35 Topamax tablets a week ago in addition to alcohol in a suicide attempt.    Progress in Program Toward Treatment Goals: Ongoing, patient attended and participated with daily group sessions x1 day.  As reported by treatment team patient had prior engagement/obligations.   Treatment team to follow-up with additional outpatient resources.  Patient to consider stepping down to intensive outpatient programming.  Progress (rationale): Continue stepping down to intensive outpatient programming -Keep follow-up appointment with attending psychiatrist and/or therapist -Continue medications as directed by attending psychiatrist  Take all medications as prescribed. Keep all follow-up appointments as scheduled.  Do not consume alcohol or use illegal drugs while on prescription medications. Report any adverse effects from your medications to your primary care provider promptly.  In the event of recurrent symptoms or worsening symptoms, call 911, a crisis hotline, or go to the nearest emergency department for evaluation.   [name], NP 12/30/2019

## 2019-12-30 NOTE — Telephone Encounter (Signed)
D:  Pt was referred per PHP.  A:  Placed call to orient pt, but there was no answer.  Left vm for patient to call case manager back.  Inform Hillery Jacks, NP.

## 2019-12-31 ENCOUNTER — Ambulatory Visit (HOSPITAL_COMMUNITY): Payer: 59

## 2019-12-31 ENCOUNTER — Other Ambulatory Visit: Payer: Self-pay

## 2019-12-31 ENCOUNTER — Other Ambulatory Visit (HOSPITAL_COMMUNITY): Payer: 59

## 2019-12-31 ENCOUNTER — Encounter (HOSPITAL_COMMUNITY): Payer: Self-pay | Admitting: Family

## 2019-12-31 ENCOUNTER — Other Ambulatory Visit (HOSPITAL_COMMUNITY): Payer: 59 | Admitting: Psychiatry

## 2019-12-31 DIAGNOSIS — Z888 Allergy status to other drugs, medicaments and biological substances status: Secondary | ICD-10-CM | POA: Diagnosis not present

## 2019-12-31 DIAGNOSIS — F419 Anxiety disorder, unspecified: Secondary | ICD-10-CM | POA: Diagnosis not present

## 2019-12-31 DIAGNOSIS — F3181 Bipolar II disorder: Secondary | ICD-10-CM | POA: Diagnosis present

## 2019-12-31 DIAGNOSIS — Z8249 Family history of ischemic heart disease and other diseases of the circulatory system: Secondary | ICD-10-CM | POA: Diagnosis not present

## 2019-12-31 DIAGNOSIS — F314 Bipolar disorder, current episode depressed, severe, without psychotic features: Secondary | ICD-10-CM | POA: Diagnosis not present

## 2019-12-31 DIAGNOSIS — R41844 Frontal lobe and executive function deficit: Secondary | ICD-10-CM | POA: Diagnosis not present

## 2019-12-31 DIAGNOSIS — F509 Eating disorder, unspecified: Secondary | ICD-10-CM | POA: Diagnosis not present

## 2019-12-31 DIAGNOSIS — Z79899 Other long term (current) drug therapy: Secondary | ICD-10-CM | POA: Diagnosis not present

## 2019-12-31 DIAGNOSIS — Z823 Family history of stroke: Secondary | ICD-10-CM | POA: Diagnosis not present

## 2019-12-31 DIAGNOSIS — Z833 Family history of diabetes mellitus: Secondary | ICD-10-CM | POA: Diagnosis not present

## 2019-12-31 DIAGNOSIS — R4589 Other symptoms and signs involving emotional state: Secondary | ICD-10-CM | POA: Diagnosis not present

## 2019-12-31 DIAGNOSIS — Z638 Other specified problems related to primary support group: Secondary | ICD-10-CM | POA: Diagnosis not present

## 2019-12-31 NOTE — Progress Notes (Signed)
Virtual Visit via Video Note  I connected with Kimberly Harper on @TODAY @ at  9:00 AM EDT by a video enabled telemedicine application and verified that I am speaking with the correct person using two identifiers.   I discussed the limitations of evaluation and management by telemedicine and the availability of in person appointments. The patient expressed understanding and agreed to proceed.  I discussed the assessment and treatment plan with the patient. The patient was provided an opportunity to ask questions and all were answered. The patient agreed with the plan and demonstrated an understanding of the instructions.   The patient was advised to call back or seek an in-person evaluation if the symptoms worsen or if the condition fails to improve as anticipated.  I provided 20 minutes of non-face-to-face time during this encounter.   Patient ID: Kimberly Harper, female   DOB: 1986-04-08, 34 y.o.   MRN: [id number] As per previous CCA states: Pt reports per inpt. Pt was inpt due to increased SI. Pt reports multiple hospitalizations- about 1 per year since [age]yr old due to SI. Pt reports not having current providers due to moving to [place] from [place] in Sept 2020. Pt states she has been unable to get providers due to long wait lists and insurance. Pt reports she is having a mixed episode of mania and depression. Pt reports the following stressors: 1) Health: Pt states she is dx with Bipolar disorder and trying to figure out what life looks like with that dx. Pt reports not having providers is very stressful. 2) Anger: pt reports increased anger due to not taking meds/not being on the correct meds. 3) Son: 78yo son is not doing well in school. Pt reports she has tried to help and son doesn't want it. Pt is stepping back from help. 4) Family: Pt's mother had a stroke and lives part time with pt and pt's son. 5) Finances: Pt shares finances are tight. Pt is concerned about her job situation since  going inpt.  Pt transitioned from PHP to MH-IOP today.  Pt only attended PHP for one day due to prior obligations per pt.  Pt states she is looking forward to attending MH-IOP.  A:  Oriented pt to virtual MH-IOP.  Discussed group expectations at length with patient.  Encouraged pt to verify her insurance benefits.  Pt gave consent for treatment, to release chart information to referred providers and to complete any forms if needed.  Pt also gave consent for attending group virtually d/t COVID-19 social distancing restrictions.  Encouraged support groups.  Strongly recommended anger management group through [facility].  Will refer pt to a psychiatrist and therapist.  R:  Pt receptive.  

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
## 2019-12-31 NOTE — Progress Notes (Signed)
Virtual Visit via Video Note  I connected with Kimberly Harper on 12/31/19 at  9:00 AM EDT by a video enabled telemedicine application and verified that I am speaking with the correct person using two identifiers.  Case Manager discussed the limitations of evaluation and management by telemedicine and the availability of in person appointments. The patient expressed understanding and agreed to proceed.   Location:  Patient: Patient Home Provider: [facility]   History of Present Illness: Bipolar 2 DO   Observations/Objective: Check In: Case Manager checked in with all participants to review discharge dates, insurance authorizations, work-related documents and needs for the treatment team. Counselor facilitated a check-in with group members to assess mood and current functioning. Client joined the program today sharing stressors, need for treatment, mental health history, support system and what she hopes to gain from the program. Client presents with moderate depression and moderate anxiety. Client denied any current SI/HI/psychosis. Group members welcomed new member to the program.    Initial Therapeutic Activity: Counselor provided group instructions for activity where they were prompted to write as many feelings words in 1 minute as they could. Group members shared and compared their responses. Client was able to come up with 15, the most words and had an extensive list. Counselor explained purpose of activity is to expand our ability to label and verbalize our feelings with others and for ourselves. Counselor provided group with a link to an exhaustive list of feelings terms.     Second Therapeutic Activity: Counselor prompted group members to create a vin diagram with describing their depressive, anxiety and overlapping symptoms. Group members shared their reflections. Client noted that worrying, poor hygiene and loneliness impact her for both depression and anxiety. Counselor  introduced a therapeutic video explaining how Avoidance links depression and anxiety. Counselor prompted group members to share reflections and takeaways from the video. Client shared that the emotional compression visual was impactful for her.    Check Out: Counselor closed program by allowing time to celebrate a graduating group member. Counselor shared reflections on progress and allow space for group members to share well wishes and appreciates to the graduating client. Counselor prompted graduating client to share takeaways, reflect on progress and final thoughts for the group.    Assessment and Plan: Clinician recommends that patient remains in IOP treatment to better manage mental health symptoms and continue to address treatment plan goals. Clinician recommends adherence to crisis/safety plan, taking medications as prescribed, and following up with medical professionals if any issues arise.   Follow Up Instructions: Clinician will send Webex link for next session. The patient was advised to call back or seek an in-person evaluation if the symptoms worsen or if the condition fails to improve as anticipated.     I provided 180 minutes of non-face-to-face time during this encounter.     Hilbert Odor, LCSW

## 2020-01-01 ENCOUNTER — Other Ambulatory Visit: Payer: Self-pay

## 2020-01-01 ENCOUNTER — Other Ambulatory Visit (HOSPITAL_COMMUNITY): Payer: 59 | Admitting: Psychiatry

## 2020-01-01 ENCOUNTER — Other Ambulatory Visit (HOSPITAL_COMMUNITY): Payer: 59

## 2020-01-01 ENCOUNTER — Encounter (HOSPITAL_COMMUNITY): Payer: Self-pay

## 2020-01-01 ENCOUNTER — Ambulatory Visit (HOSPITAL_COMMUNITY): Payer: 59

## 2020-01-01 DIAGNOSIS — F3181 Bipolar II disorder: Secondary | ICD-10-CM

## 2020-01-01 DIAGNOSIS — F314 Bipolar disorder, current episode depressed, severe, without psychotic features: Secondary | ICD-10-CM | POA: Diagnosis not present

## 2020-01-01 NOTE — Progress Notes (Signed)
Virtual Visit via Video Note  I connected with Kimberly Harper on 01/01/20 at  9:00 AM EDT by a video enabled telemedicine application and verified that I am speaking with the correct person using two identifiers.  Case Manager discussed the limitations of evaluation and management by telemedicine and the availability of in person appointments. The patient expressed understanding and agreed to proceed.   Location:  Patient: Patient Home Provider: [facility]   History of Present Illness: Bipolar 2 DO   Observations/Objective: Check In: Case Manager checked in with all participants to review discharge dates, insurance authorizations, work-related documents and needs for the treatment team. Counselor facilitated a check-in with group members to assess mood and current functioning. Client reports "feeling loopy" as a side affect to medications. She stated she is experiencing a manic episode, feeling very productive, difficulties sleeping. Client denied any current SI/HI/psychosis.    Initial Therapeutic Activity: Counselor introduced Sheppard Coil, [place] Chaplain to present information and discussion on Grief and Loss. Group members engaged in discussion, sharing how grief impacts them, what comforts them, what emotions are felt, labeling losses, etc. After guest speaker logged off, Counselor prompted group to spend 10-15 minutes journaling to process personal grief and loss situations. Counselor processed entries with group and client's identified areas for additional processing in individual therapy. Client noted wanting to discuss a specific loss more in depth with an individual therapist.    Second Therapeutic Activity: Counselor provided group with links for the Cycle of Anxiety, Overcoming Anxiety, Cycle of Depression and 5 Strategies for Overcoming Depression. Counselor shared psychoeducation with the group and engaged all in discussion, connecting with recent sessions material.    Check Out: Counselor closed program prompting group members to share one skill they plan to apply over the coming days. Client chose focusing on "happy memories".   Assessment and Plan: Clinician recommends that patient remains in IOP treatment to better manage mental health symptoms and continue to address treatment plan goals. Clinician recommends adherence to crisis/safety plan, taking medications as prescribed, and following up with medical professionals if any issues arise.   Follow Up Instructions: Clinician will send Webex link for next session. The patient was advised to call back or seek an in-person evaluation if the symptoms worsen or if the condition fails to improve as anticipated.     I provided 180 minutes of non-face-to-face time during this encounter.     Hilbert Odor, LCSW

## 2020-01-02 ENCOUNTER — Other Ambulatory Visit: Payer: Self-pay

## 2020-01-02 ENCOUNTER — Other Ambulatory Visit (HOSPITAL_COMMUNITY): Payer: 59

## 2020-01-02 ENCOUNTER — Other Ambulatory Visit (HOSPITAL_COMMUNITY): Payer: 59 | Admitting: Psychiatry

## 2020-01-02 ENCOUNTER — Encounter (HOSPITAL_COMMUNITY): Payer: Self-pay

## 2020-01-02 DIAGNOSIS — F3181 Bipolar II disorder: Secondary | ICD-10-CM

## 2020-01-02 DIAGNOSIS — F314 Bipolar disorder, current episode depressed, severe, without psychotic features: Secondary | ICD-10-CM | POA: Diagnosis not present

## 2020-01-02 NOTE — Progress Notes (Signed)
Virtual Visit via Video Note  I connected with Kimberly Harper on 01/02/20 at  9:00 AM EDT by a video enabled telemedicine application and verified that I am speaking with the correct person using two identifiers.  Case Manager discussed the limitations of evaluation and management by telemedicine and the availability of in person appointments. The patient expressed understanding and agreed to proceed.   Location:  Patient: Patient Home Provider: Home Office   History of Present Illness: Bipolar 2   Observations/Objective: Check In: Case Manager checked in with all participants to review discharge dates, insurance authorizations, work-related documents and needs for the treatment team. Counselor facilitated a check-in with group members to assess mood and current functioning. Client reports feeling tired, but pushing through to grocery shop and cook a healthy meal. She noted being proud of herself for making healthier choices. She reports on feeling more like her "goofy", fun-loving self. Client presents with moderate depression and moderate anxiety. Client denied any current SI/HI/psychosis.    Initial Therapeutic Activity: Counselor prompted group members to share coping skills and resources they personally use to manage/stabilize/improve their mental health. Group members shared useful apps, websites, sensory items, books and ideas with group. Client participated in sharing and discussion.   Second Therapeutic Activity: Counselor provided group with an exhaustive list of community, state and national mental health, substance abuse and basic need resources. Counselor prompted group members to save numbers in their phones and to bookmark links for future use.   Check Out: Counselor prompted each group member to share one resource they will utilize or check in to over the next week. Client shared that they were most interested in the Creative Writing Class at [facility].    Assessment and  Plan: Clinician recommends that patient remains in IOP treatment to better manage mental health symptoms and continue to address treatment plan goals. Clinician recommends adherence to crisis/safety plan, taking medications as prescribed, and following up with medical professionals if any issues arise.   Follow Up Instructions: Clinician will send Webex link for next session. The patient was advised to call back or seek an in-person evaluation if the symptoms worsen or if the condition fails to improve as anticipated.     I provided 180 minutes of non-face-to-face time during this encounter.     Hilbert Odor, LCSW

## 2020-01-05 ENCOUNTER — Other Ambulatory Visit (HOSPITAL_COMMUNITY): Payer: 59 | Admitting: Psychiatry

## 2020-01-05 ENCOUNTER — Encounter (HOSPITAL_COMMUNITY): Payer: Self-pay

## 2020-01-05 ENCOUNTER — Other Ambulatory Visit: Payer: Self-pay

## 2020-01-05 DIAGNOSIS — F314 Bipolar disorder, current episode depressed, severe, without psychotic features: Secondary | ICD-10-CM | POA: Diagnosis not present

## 2020-01-05 DIAGNOSIS — F3181 Bipolar II disorder: Secondary | ICD-10-CM

## 2020-01-05 NOTE — Progress Notes (Signed)
Virtual Visit via Video Note  I connected with [name] on 01/05/20 at  9:00 AM EDT by a video enabled telemedicine application and verified that I am speaking with the correct person using two identifiers.  Case Manager discussed the limitations of evaluation and management by telemedicine and the availability of in person appointments. The patient expressed understanding and agreed to proceed.   Location:  Patient: Patient Home Provider: Home Office   History of Present Illness: Bipolar 2 DO   Observations/Objective: Check In: Case Manager checked in with all participants to review discharge dates, insurance authorizations, work-related documents and needs for the treatment team. Counselor facilitated a check-in with group members to assess mood and current functioning. Client reports having a productive weekend. She was pleased to report consistency in making healthy eating choices and elaborated on the unhealthy techniques she was using to loose weight. Client presents with moderate depression and mild anxiety. Client denied any current SI/HI/psychosis.    Initial Therapeutic Activity: Counselor prompted group members to reflect/journal on the topic of Forgiveness, including what it means to them, who they need to offer/receive forgiveness from, how they can forgive themselves, etc. Counselor processed responses aloud. Counselor provided psychoeducation on The 5 Stages of Forgiveness. Counselor and Group Members discussed concepts and application. Client noted that how the 5 stages are laid out is very helpful for understanding.   Second Therapeutic Activity: Counselor provided group with a handout on Shame and Self-Compassion, Rewriting Our Thoughts. Counselor walked clients through the process and allowed time for them to complete activity. A few group members shared responses and received feedback.    Check Out: Counselor closed program prompting group members to share one  skill they plan to apply over the coming days. Client chose to spend time coloring to calm self and reflect.   Assessment and Plan: Clinician recommends that patient remains in IOP treatment to better manage mental health symptoms and continue to address treatment plan goals. Clinician recommends adherence to crisis/safety plan, taking medications as prescribed, and following up with medical professionals if any issues arise.   Follow Up Instructions: Clinician will send Webex link for next session. The patient was advised to call back or seek an in-person evaluation if the symptoms worsen or if the condition fails to improve as anticipated.     I provided 180 minutes of non-face-to-face time during this encounter.     Hilbert Odor, LCSW

## 2020-01-06 ENCOUNTER — Other Ambulatory Visit: Payer: Self-pay

## 2020-01-06 ENCOUNTER — Encounter (HOSPITAL_COMMUNITY): Payer: Self-pay

## 2020-01-06 ENCOUNTER — Other Ambulatory Visit (HOSPITAL_COMMUNITY): Payer: 59 | Admitting: Psychiatry

## 2020-01-06 DIAGNOSIS — F314 Bipolar disorder, current episode depressed, severe, without psychotic features: Secondary | ICD-10-CM | POA: Diagnosis not present

## 2020-01-06 DIAGNOSIS — F3181 Bipolar II disorder: Secondary | ICD-10-CM

## 2020-01-06 MED ORDER — ARIPIPRAZOLE 10 MG PO TABS
10.0000 mg | ORAL_TABLET | Freq: Every day | ORAL | 2 refills | Status: DC
Start: 2020-01-06 — End: 2020-02-06

## 2020-01-06 NOTE — Progress Notes (Signed)
Virtual Visit via Video Note  I connected with Kimberly Harper on 01/06/20 at  9:00 AM EDT by a video enabled telemedicine application and verified that I am speaking with the correct person using two identifiers.  Case Manager discussed the limitations of evaluation and management by telemedicine and the availability of in person appointments. The patient expressed understanding and agreed to proceed.   Location:  Patient: Patient Home Provider: Home Office   History of Present Illness: Bipolar 2 DO   Observations/Objective: Check In: Case Manager checked in with all participants to review discharge dates, insurance authorizations, work-related documents and needs for the treatment team. Counselor facilitated a check-in with group members to assess mood and current functioning. Client reports that she experienced increased anxiety during the class yesterday. She decided to take her medications early, but it caused extreme tiredness and caused her to need a nap. She stated she didn't like the feeling of tiredness and plans to no longer take the medication. Counselor advised to discuss with provider.. Client presents with moderate depression and moderate anxiety. Client denied any current SI/HI/psychosis. Group members welcomed a new client to the program.    Initial Therapeutic Activity: Counselor reviewed an article with the Group entitled "5 Mindfulness Practices for Daily Use". Counselor shared psychoeducation about mindfulness and application of skills. Article covered Mindful Waking, Mindful Eating, Mindful Reminders, Mindful Exercise and Mindful Driving. Group members engaged in discussion about application and participated in practices. Client plans to implement the practices of being kind and patient with self.    Second Therapeutic Activity: Counselor facilitated a Health visitor on Calming Anxiety in Body and Mind. Group members participated in deep breathing, positive  affirmations, and progressive muscle relaxation.  Client noted that she was too anxious to relax and did not find the script helpful today.  Check Out: Counselor closed program by allowing time to celebrate a graduating group member. Counselor shared reflections on progress and allow space for group members to share well wishes and appreciates to the graduating client. Counselor prompted graduating client to share takeaways, reflect on progress and final thoughts for the group.   Assessment and Plan: Clinician recommends that patient remains in IOP treatment to better manage mental health symptoms and continue to address treatment plan goals. Clinician recommends adherence to crisis/safety plan, taking medications as prescribed, and following up with medical professionals if any issues arise.   Follow Up Instructions: Clinician will send Webex link for next session. The patient was advised to call back or seek an in-person evaluation if the symptoms worsen or if the condition fails to improve as anticipated.     I provided 180 minutes of non-face-to-face time during this encounter.     Hilbert Odor, LCSW

## 2020-01-07 ENCOUNTER — Other Ambulatory Visit (HOSPITAL_COMMUNITY): Payer: 59 | Admitting: Psychiatry

## 2020-01-07 ENCOUNTER — Other Ambulatory Visit: Payer: Self-pay

## 2020-01-07 ENCOUNTER — Telehealth (HOSPITAL_COMMUNITY): Payer: Self-pay | Admitting: Family

## 2020-01-07 ENCOUNTER — Encounter (HOSPITAL_COMMUNITY): Payer: Self-pay | Admitting: Psychiatry

## 2020-01-07 DIAGNOSIS — F314 Bipolar disorder, current episode depressed, severe, without psychotic features: Secondary | ICD-10-CM | POA: Diagnosis not present

## 2020-01-07 DIAGNOSIS — F3181 Bipolar II disorder: Secondary | ICD-10-CM

## 2020-01-07 NOTE — Progress Notes (Signed)
Virtual Visit via Video Note  I connected with Kimberly Harper on 01/07/20 at  9:00 AM EDT by a video enabled telemedicine application and verified that I am speaking with the correct person using two identifiers.  Case Manager discussed the limitations of evaluation and management by telemedicine and the availability of in person appointments. The patient expressed understanding and agreed to proceed.   Location:  Patient: Patient Home Provider: Home Office   History of Present Illness: Bipolar 2 DO   Observations/Objective: Check In: Case Manager checked in with all participants to review discharge dates, insurance authorizations, work-related documents and needs for the treatment team. Counselor facilitated a check-in with group members to assess mood and current functioning. Client reports that she experienced increased anxiety yesterday. She noted that she attempted to read and make jewelry to distract from anxiety. She discussed issues with perfectionism and the group shared coping strategies. Client presents with moderate depression and moderate anxiety. Client denied any current SI/HI/psychosis.    Initial Therapeutic Activity: Counselor introduced our guest speaker, Peggye Fothergill, [facility] Pharmacist, who shared about psychiatric medications, side effects, treatment considerations and how to communicate with medical professionals. Group Members asked questions and shared medication concerns. Client asked specific questions about her medications.    Second Therapeutic Activity: Counselor introduced our guest speaker, Clyda Greener, Therapist, art, who presented information on overall wellness and self-care strategies. Group members engaged and participated in activities and discussion. Client asked good questions and noted healthy behaviors to implement.     Check Out: Counselor closed program by allowing time to celebrate two graduating group members. Counselor shared reflections on  progress and allow space for group members to share well wishes and appreciates to the graduating client. Counselor prompted graduating clients to share takeaways, reflect on progress and final thoughts for the group.   Assessment and Plan: Clinician recommends that patient remains in IOP treatment to better manage mental health symptoms and continue to address treatment plan goals. Clinician recommends adherence to crisis/safety plan, taking medications as prescribed, and following up with medical professionals if any issues arise.   Follow Up Instructions: Clinician will send Webex link for next session. The patient was advised to call back or seek an in-person evaluation if the symptoms worsen or if the condition fails to improve as anticipated.     I provided 180 minutes of non-face-to-face time during this encounter.     Hilbert Odor, LCSW

## 2020-01-07 NOTE — BH Specialist Note (Cosign Needed)
Virtual Visit via Telephone Note  I connected with Kimberly Harper on 01/07/20 at  by telephone and verified that I am speaking with the correct person using two identifiers.   I discussed the limitations, risks, security and privacy concerns of performing an evaluation and management service by telephone and the availability of in person appointments. I also discussed with the patient that there may be a patient responsible charge related to this service. The patient expressed understanding and agreed to proceed.  I discussed the assessment and treatment plan with the patient. The patient was provided an opportunity to ask questions and all were answered. The patient agreed with the plan and demonstrated an understanding of the instructions.   The patient was advised to call back or seek an in-person evaluation if the symptoms worsen or if the condition fails to improve as anticipated.  I provided 20 minutes of non-face-to-face time during this encounter.   Suella Broad, FNP  BH MD/PA/NP OP Progress Note  01/07/2020 3:22 PM Kimberly Harper  MRN:  [id number]  Chief Complaint:  "My mind is just occupied right now. I got a lot of things going on right now. I am busy. "   HPI: 34 year old female with a probable past psychiatric history significant for anxiety, depression, posttraumatic stress disorder and probable borderline personality disorder who presented to the [facility] behavioral health hospital as a direct evaluation. The patient stated that she has been having mood instability recently, and describes laughing at 1 moment crying the next. She stated that she had taken approximately 35 Topamax tablets a week ago in addition to alcohol in a suicide attempt.  Visit Diagnosis:    ICD-10-CM   1. Bipolar 2 disorder, major depressive episode ([place])  F31.81     Past Psychiatric History: Patient stated that she had been hospitalized proximately every year since age 67  or 34. She stated there may have been 1 or 2 years where she was able to avoid hospitalization. These hospitalizations apparently were all in [place]. She stated she has been treated with multiple medications in the past, none have been successful. She stated that most recently she was seen at [facility] in [place]. He stated they were not helpful, and the medications they recommended she was never able to get because "they had to be prior authorized". Recommendations were for BuSpar and Neurontin.  Past Medical History:  Past Medical History:  Diagnosis Date  . Anxiety   . Bipolar 1 disorder ([place])   . Major depressive disorder     Past Surgical History:  Procedure Laterality Date  . CESAREAN SECTION    . DILATION AND CURETTAGE OF UTERUS      Family Psychiatric History: She has multiple family members with multiple different disorders including bipolar disorder, depression, anxiety and schizophrenia.   Family History:  Family History  Problem Relation Age of Onset  . Hypertension Mother   . Diabetes Mother   . Stroke Mother     Social History:  Social History   Socioeconomic History  . Marital status: Single    Spouse name: Not on file  . Number of children: Not on file  . Years of education: Not on file  . Highest education level: Not on file  Occupational History  . Not on file  Tobacco Use  . Smoking status: Never Smoker  . Smokeless tobacco: Never Used  Substance and Sexual Activity  . Alcohol use: Yes    Comment: socially  .  Drug use: Never  . Sexual activity: Yes    Birth control/protection: None  Other Topics Concern  . Not on file  Social History Narrative  . Not on file   Social Determinants of Health   Financial Resource Strain:   . Difficulty of Paying Living Expenses:   Food Insecurity:   . Worried About Programme researcher, broadcasting/film/video in the Last Year:   . Barista in the Last Year:   Transportation Needs:   . Freight forwarder  (Medical):   Marland Kitchen Lack of Transportation (Non-Medical):   Physical Activity:   . Days of Exercise per Week:   . Minutes of Exercise per Session:   Stress:   . Feeling of Stress :   Social Connections:   . Frequency of Communication with Friends and Family:   . Frequency of Social Gatherings with Friends and Family:   . Attends Religious Services:   . Active Member of Clubs or Organizations:   . Attends Banker Meetings:   Marland Kitchen Marital Status:     Allergies:  Allergies  Allergen Reactions  . Multiple Vitamins-Iron Swelling    States that vitamins and iron have caused swelling of tongue and gums    Metabolic Disorder Labs: Lab Results  Component Value Date   HGBA1C 5.3 12/07/2019   MPG 105 12/07/2019   No results found for: PROLACTIN Lab Results  Component Value Date   CHOL 189 12/07/2019   TRIG 47 12/07/2019   HDL 39 (L) 12/07/2019   CHOLHDL 4.8 12/07/2019   VLDL 9 12/07/2019   LDLCALC 141 (H) 12/07/2019   Lab Results  Component Value Date   TSH 0.646 12/07/2019    Therapeutic Level Labs: No results found for: LITHIUM No results found for: VALPROATE No components found for:  CBMZ  Current Medications: Current Outpatient Medications  Medication Sig Dispense Refill  . ARIPiprazole (ABILIFY) 10 MG tablet Take 1 tablet (10 mg total) by mouth daily. 30 tablet 2  . busPIRone (BUSPAR) 5 MG tablet Take 1 tablet (5 mg total) by mouth 3 (three) times daily. 90 tablet 0  . gabapentin (NEURONTIN) 300 MG capsule Take 1 capsule (300 mg total) by mouth 3 (three) times daily. 90 capsule 0  . hydrOXYzine (ATARAX/VISTARIL) 25 MG tablet Take 1 tablet (25 mg total) by mouth 3 (three) times daily as needed for anxiety. 30 tablet 0  . mirtazapine (REMERON) 15 MG tablet Take 1 tablet (15 mg total) by mouth at bedtime. 30 tablet 0  . traZODone (DESYREL) 50 MG tablet Take 1 tablet (50 mg total) by mouth at bedtime as needed for sleep. 30 tablet 0   No current  facility-administered medications for this visit.    Musculoskeletal: Strength & Muscle Tone: UTA Gait & Station: UTA Patient leans: UTA  Psychiatric Specialty Exam: Review of Systems  Last menstrual period 12/12/2019.There is no height or weight on file to calculate BMI.  General Appearance: Fairly Groomed  Eye Contact:  Fair  Speech:  Clear and Coherent and Normal Rate  Volume:  Normal  Mood:  Depressed  Affect:  Appropriate and Blunt  Thought Process:  Coherent, Linear and Descriptions of Associations: Intact  Orientation:  Full (Time, Place, and Person)  Thought Content: Logical   Suicidal Thoughts:  No  Homicidal Thoughts:  No  Memory:  Immediate;   Fair Recent;   Fair  Judgement:  Intact  Insight:  Shallow  Psychomotor Activity:  Normal  Concentration:  Concentration:  Fair and Attention Span: Fair  Recall:  [employer] of Knowledge: Fair  Language: Fair  Akathisia:  No  Handed:  Right  AIMS (if indicated): not done  Assets:  Communication Skills Desire for Improvement Financial Resources/Insurance Housing Leisure Time Physical Health Social Support Vocational/Educational  ADL's:  Intact  Cognition: WNL  Sleep:  Fair   Screenings: AIMS     Ad[facility] (Discharged) [facility]  AIMS Total Score 0    AUDIT     Admission (Discharged) [facility]  Alcohol Use Disorder Identification Test Final Score (AUDIT) 1      Assessment and Plan:  Continue with IOP programming. Encourage daily participation of group therapy.  Will increase medications Buspar 10mg  po daily and Abilify 10mg  po daily.    , FNP 01/07/2020, 3:22 PM

## 2020-01-08 ENCOUNTER — Other Ambulatory Visit: Payer: Self-pay

## 2020-01-08 ENCOUNTER — Encounter (HOSPITAL_COMMUNITY): Payer: Self-pay

## 2020-01-08 ENCOUNTER — Other Ambulatory Visit (HOSPITAL_COMMUNITY): Payer: 59 | Admitting: Psychiatry

## 2020-01-08 DIAGNOSIS — F314 Bipolar disorder, current episode depressed, severe, without psychotic features: Secondary | ICD-10-CM | POA: Diagnosis not present

## 2020-01-08 DIAGNOSIS — F3181 Bipolar II disorder: Secondary | ICD-10-CM

## 2020-01-08 NOTE — Progress Notes (Signed)
Virtual Visit via Video Note  I connected with Kimberly Harper on 01/08/20 at  9:00 AM EDT by a video enabled telemedicine application and verified that I am speaking with the correct person using two identifiers.  Case Manager discussed the limitations of evaluation and management by telemedicine and the availability of in person appointments. The patient expressed understanding and agreed to proceed.   Location:  Patient: Patient Home Provider: Home Office   History of Present Illness: ipolar 2 DO   Observations/Objective: Check In: Case Manager checked in with all participants to review discharge dates, insurance authorizations, work-related documents and needs for the treatment team. Counselor facilitated a check-in with group members to assess mood and current functioning. Client reports that the technique of doing a "brain dump" has been helpful for her in improving sleep and clearing mind to be less anxious. Client presents with moderate depression and moderate anxiety. Client denied any current SI/HI/psychosis.    Initial Therapeutic Activity: Counselor introduced Janetta Hora, [facility] Chaplain to present information and discussion on Grief and Loss. Group members engaged in discussion, sharing how grief impacts them, what comforts them, what emotions are felt, labeling losses, etc. After guest speaker logged off, Counselor prompted group to spend 10-15 minutes journaling to process personal grief and loss situations. Counselor processed entries with group and client's identified areas for additional processing in individual therapy. Client noted loses associated with mental health status.  Second Therapeutic Activity: Counselor presented information on Cognitive Distortions. Client shared a video outlining 15 styles of cognitive distortions. Group members shared which ones they do most in their thought processes. Client identified with control fallacy, fairness fallacy and the  "shoulds". She noted that she has expectations and rules for everyone in her life that can be unrealistic at times, causing her to feel aggrivated.Counselor shared 5 strategies for combating distorted thinking. Counselor shared links for resources for Group Members to review for homework.   Check Out: Counselor closed program by allowing time to celebrate two graduating group members. Counselor shared reflections on progress and allow space for group members to share well wishes and appreciates to the graduating client. Counselor prompted graduating clients to share takeaways, reflect on progress and final thoughts for the group.  Assessment and Plan: Clinician recommends that patient remains in IOP treatment to better manage mental health symptoms and continue to address treatment plan goals. Clinician recommends adherence to crisis/safety plan, taking medications as prescribed, and following up with medical professionals if any issues arise.   Follow Up Instructions: Clinician will send Webex link for next session. The patient was advised to call back or seek an in-person evaluation if the symptoms worsen or if the condition fails to improve as anticipated.     I provided 180 minutes of non-face-to-face time during this encounter.     Hilbert Odor, LCSW

## 2020-01-09 ENCOUNTER — Encounter (HOSPITAL_COMMUNITY): Payer: Self-pay

## 2020-01-09 ENCOUNTER — Other Ambulatory Visit (HOSPITAL_COMMUNITY): Payer: 59 | Admitting: Psychiatry

## 2020-01-09 ENCOUNTER — Other Ambulatory Visit: Payer: Self-pay

## 2020-01-09 DIAGNOSIS — F314 Bipolar disorder, current episode depressed, severe, without psychotic features: Secondary | ICD-10-CM | POA: Diagnosis not present

## 2020-01-09 DIAGNOSIS — F3181 Bipolar II disorder: Secondary | ICD-10-CM

## 2020-01-09 NOTE — Progress Notes (Signed)
Virtual Visit via Video Note  I connected with Kimberly Harper on 01/09/20 at  9:00 AM EDT by a video enabled telemedicine application and verified that I am speaking with the correct person using two identifiers.  Case Manager discussed the limitations of evaluation and management by telemedicine and the availability of in person appointments. The patient expressed understanding and agreed to proceed.   Location:  Patient: Patient Home Provider: Home Office   History of Present Illness: Bipolar 2 DO   Observations/Objective: Check In: Case Manager checked in with all participants to review discharge dates, insurance authorizations, work-related documents and needs for the treatment team. Counselor facilitated a check-in with group members to assess mood and current functioning. Client reports that she is feeling "good" and "ok" today, with nothing significant to report. Client presents with moderate depression and moderate anxiety. Client denied any current SI/HI/psychosis.    Initial Therapeutic Activity: Counselor used a prompt for the 79 Lists Project Journal to explore different aspects of the Group Members. Each shared their list and were able to join more in the process, to learn more personal details about each other outside of mental health concerns. Client shared that she likes to organize, use her creative "juices", is detailed oriented, an over lover and over carer, which causes hurt and disappointment.  Second Therapeutic Activity: Counselor facilitated an assessment on Anxiety -Igniting Thought Processes, using applied neuroscience as suggested interventions. Group members assessed pessimism, obsessive thinking, worry, perfectionism, shame/guilt, and right brain thinking. Client noted her high scores we perfectionism, right based and worrying were high scores for her, discussing strategies to decrease problematic thought processes.  Check Out: Counselor closed program by  allowing time to celebrate two graduating group members and a member stepping up to [facility]. Counselor shared reflections on progress and allow space for group members to share well wishes and appreciates to the graduating clients. Counselor prompted graduating clients to share takeaways, reflect on progress and final thoughts for the group.  Assessment and Plan: Clinician recommends that patient remains in IOP treatment to better manage mental health symptoms and continue to address treatment plan goals. Clinician recommends adherence to crisis/safety plan, taking medications as prescribed, and following up with medical professionals if any issues arise.   Follow Up Instructions: Clinician will send Webex link for next session. The patient was advised to call back or seek an in-person evaluation if the symptoms worsen or if the condition fails to improve as anticipated.     I provided 180 minutes of non-face-to-face time during this encounter.     Hilbert Odor, LCSW

## 2020-01-12 ENCOUNTER — Other Ambulatory Visit: Payer: Self-pay

## 2020-01-12 ENCOUNTER — Encounter (HOSPITAL_COMMUNITY): Payer: Self-pay | Admitting: Psychiatry

## 2020-01-12 ENCOUNTER — Other Ambulatory Visit (HOSPITAL_COMMUNITY): Payer: 59 | Admitting: Psychiatry

## 2020-01-12 DIAGNOSIS — F314 Bipolar disorder, current episode depressed, severe, without psychotic features: Secondary | ICD-10-CM | POA: Diagnosis not present

## 2020-01-12 DIAGNOSIS — F3181 Bipolar II disorder: Secondary | ICD-10-CM

## 2020-01-12 NOTE — Progress Notes (Signed)
Virtual Visit via Video Note  I connected with Kimberly Harper on 01/12/20 at  9:00 AM EDT by a video enabled telemedicine application and verified that I am speaking with the correct person using two identifiers.  Case Manager discussed the limitations of evaluation and management by telemedicine and the availability of in person appointments. The patient expressed understanding and agreed to proceed.   Location:  Patient: Patient Home Provider: Home Office   History of Present Illness: Bipolar 2 DO, MDD   Observations/Objective: Check In: Case Manager checked in with all participants to review discharge dates, insurance authorizations, work-related documents and needs for the treatment team.    Initial Therapeutic Activity: Counselor facilitated a therapeutic processing with group members to assess mood and current functioning. Client reports onset of depressive symptoms. Is concerned that medications are not stabilizing Believes experienced manic episode last week, not positive side effects from medication. Client identified multiple triggers to onset of depression, including negative thoughts, loneliness and isolation. Client presents with moderate depression and moderate anxiety. Client denied any current SI/HI/psychosis. Counselor introduced new client to the group for all to welcome and start the joining process. Themes discussed: single parenting, coping with depression, rebuilding support system, childhood traumas, ie, trust issues, abandonment, parents with addition.   Second Therapeutic Activity: Counselor prompted group members to journal/reflect on what topics they would like more information on and to be covered in group. The group recently graduated the majority of participants, making need for a reassessment of group goals. Group members identified the following: guilt, anger management, loneliness/isolation issues, wise mind, radical acceptance, identity, purpose, social  anxieties, boundaries/oversharing, and healing from relationships.   Check Out: Counselor closed program prompting group members to share one skill they plan to apply over the coming days. Client chose to rest her mind and eyes after group.  Assessment and Plan: Clinician recommends that patient remains in IOP treatment to better manage mental health symptoms and continue to address treatment plan goals. Clinician recommends adherence to crisis/safety plan, taking medications as prescribed, and following up with medical professionals if any issues arise.   Follow Up Instructions: Clinician will send Webex link for next session. The patient was advised to call back or seek an in-person evaluation if the symptoms worsen or if the condition fails to improve as anticipated.     I provided 180 minutes of non-face-to-face time during this encounter.     Hilbert Odor, LCSW

## 2020-01-13 ENCOUNTER — Encounter (HOSPITAL_COMMUNITY): Payer: Self-pay

## 2020-01-13 ENCOUNTER — Other Ambulatory Visit: Payer: Self-pay

## 2020-01-13 ENCOUNTER — Other Ambulatory Visit (HOSPITAL_COMMUNITY): Payer: 59 | Admitting: Psychiatry

## 2020-01-13 DIAGNOSIS — F3181 Bipolar II disorder: Secondary | ICD-10-CM

## 2020-01-13 DIAGNOSIS — F314 Bipolar disorder, current episode depressed, severe, without psychotic features: Secondary | ICD-10-CM | POA: Diagnosis not present

## 2020-01-13 NOTE — Progress Notes (Signed)
Virtual Visit via Video Note  I connected with Kimberly Harper on 01/13/20 at  9:00 AM EDT by a video enabled telemedicine application and verified that I am speaking with the correct person using two identifiers.  At orientation to the IOP program Case Manager discussed the limitations of evaluation and management by telemedicine and the availability of in person appointments. The patient expressed understanding and agreed to proceed with virtual visits.   Location:  Patient: Patient Home Provider: Home Office   History of Present Illness: Bipolar 2 Do, MDD   Observations/Objective: Check In: Case Manager checked in with all participants to review discharge dates, insurance authorizations, work-related documents and needs for the treatment team, including medication review and assessment. Case Manager introduced new Client to the group, all welcomed and started the joining process.   Initial Therapeutic Activity: Counselor facilitated therapeutic processing with group members to assess mood and current functioning, prompting group members to share about application of skills, progress and challenges in treatment/personal lives. Client reports implementing healthy coping skills by napping, coloring, creating jewelry, cooking a meal and having a positive outlook. Client is unsure of mood trends, concerned medications are not stabilizing as hoped. Client presents with moderate depression and moderate anxiety. Client denied any current SI/HI/psychosis.   Second Therapeutic Activity: Counselor prompted group members to journal for 5-10 minutes on the concept of acceptance, with the question of "What is hard for you to accept right now?" Counselor allowed each to share their reflections to bring contest to next activity. Counselor provided psychoeducation on the DBT skill of Radical Acceptance, sharing the steps and a practical application video on Willingness by Rob Hickman, LMFT of Therapy in a  Nutshell. Client engaged in discussion and noted multiple issues she struggles to accept about her life. She would like to focus in on health expression of emotions.  Check Out: Counselor closed program prompting group members to share one self-care practice or productivity activity they plan to engage in today. Client plans to spend time with a friend.  Assessment and Plan: Clinician recommends that Client remain in IOP treatment to better manage mental health symptoms, stabilization and to address treatment plan goals. Clinician recommends adherence to crisis/safety plan, taking medications as prescribed, and following up with medical professionals if any issues arise.   Follow Up Instructions: Clinician will send Webex link for next session. The Client was advised to call back or seek an in-person evaluation if the symptoms worsen or if the condition fails to improve as anticipated.     I provided 180 minutes of non-face-to-face time during this encounter.     Hilbert Odor, LCSW

## 2020-01-14 ENCOUNTER — Encounter (HOSPITAL_COMMUNITY): Payer: Self-pay

## 2020-01-14 ENCOUNTER — Other Ambulatory Visit: Payer: Self-pay

## 2020-01-14 ENCOUNTER — Other Ambulatory Visit (HOSPITAL_COMMUNITY): Payer: 59 | Admitting: Psychiatry

## 2020-01-14 DIAGNOSIS — F3181 Bipolar II disorder: Secondary | ICD-10-CM

## 2020-01-14 DIAGNOSIS — F314 Bipolar disorder, current episode depressed, severe, without psychotic features: Secondary | ICD-10-CM | POA: Diagnosis not present

## 2020-01-14 NOTE — Progress Notes (Signed)
Virtual Visit via Video Note  I connected with Kimberly Harper on 01/14/20 at  9:00 AM EDT by a video enabled telemedicine application and verified that I am speaking with the correct person using two identifiers.  At orientation to the IOP program Case Manager discussed the limitations of evaluation and management by telemedicine and the availability of in person appointments. The patient expressed understanding and agreed to proceed with virtual visits.   Location:  Patient: Patient Home Provider: Home Office   History of Present Illness: Bipolar 2 DO   Observations/Objective: Check In: Case Manager checked in with all participants to review discharge dates, insurance authorizations, work-related documents and needs for the treatment team, including medication review and assessment. Case Manager introduced new Client to the group, all welcomed and started the joining process.   Initial Therapeutic Activity: Counselor facilitated therapeutic processing with group members to assess mood and current functioning, prompting group members to share about application of skills, progress and challenges in treatment/personal lives. Client reports having a "lame" day yesterday, not in a "good mood" today. Client shared about efforts to set boundaries with friend, who responded in a hurtful manner. Client feeling increased loneliness, depression, low self-worth, low self-esteem. Client presents with moderate depression and moderate anxiety. Client denied any current SI/HI/psychosis.   Second Therapeutic Activity: Counselor engaged group in a discussion about "Social Awkwardness", as requested earlier in the week. Group members shared their struggles related to social anxiety, being introverted, interacting in public during and post-pandemic, and unhealthy cognitions. Client shared that in social settings she becomes quiet or asks questions. Client stated that this caused others to be uncomfortable and  awkward at times. Can be unsure how to engage with others . Counselor provided psychoeducation with an article from [employer] and a video from [employer], to share skills and strategies to overcome social awkwardness.   Check Out: Counselor closed program prompting group members to share one self-care practice or productivity activity they plan to engage in today. Client plans to help son prepared for interview.   Assessment and Plan: Clinician recommends that Client remain in IOP treatment to better manage mental health symptoms, stabilization and to address treatment plan goals. Clinician recommends adherence to crisis/safety plan, taking medications as prescribed, and following up with medical professionals if any issues arise.   Follow Up Instructions: Clinician will send Webex link for next session. The Client was advised to call back or seek an in-person evaluation if the symptoms worsen or if the condition fails to improve as anticipated.     I provided 160 minutes of non-face-to-face time during this encounter.     Hilbert Odor, LCSW

## 2020-01-15 ENCOUNTER — Telehealth (HOSPITAL_COMMUNITY): Payer: Self-pay | Admitting: Psychiatry

## 2020-01-15 ENCOUNTER — Other Ambulatory Visit: Payer: Self-pay

## 2020-01-15 ENCOUNTER — Other Ambulatory Visit (HOSPITAL_COMMUNITY): Payer: 59 | Admitting: Psychiatry

## 2020-01-15 ENCOUNTER — Encounter (HOSPITAL_COMMUNITY): Payer: Self-pay

## 2020-01-15 DIAGNOSIS — R41844 Frontal lobe and executive function deficit: Secondary | ICD-10-CM | POA: Diagnosis not present

## 2020-01-15 DIAGNOSIS — Z823 Family history of stroke: Secondary | ICD-10-CM | POA: Diagnosis not present

## 2020-01-15 DIAGNOSIS — R4589 Other symptoms and signs involving emotional state: Secondary | ICD-10-CM | POA: Diagnosis not present

## 2020-01-15 DIAGNOSIS — F509 Eating disorder, unspecified: Secondary | ICD-10-CM | POA: Diagnosis not present

## 2020-01-15 DIAGNOSIS — Z79899 Other long term (current) drug therapy: Secondary | ICD-10-CM | POA: Diagnosis not present

## 2020-01-15 DIAGNOSIS — F419 Anxiety disorder, unspecified: Secondary | ICD-10-CM | POA: Diagnosis not present

## 2020-01-15 DIAGNOSIS — F3181 Bipolar II disorder: Secondary | ICD-10-CM | POA: Diagnosis present

## 2020-01-15 DIAGNOSIS — Z638 Other specified problems related to primary support group: Secondary | ICD-10-CM | POA: Diagnosis not present

## 2020-01-15 DIAGNOSIS — Z8249 Family history of ischemic heart disease and other diseases of the circulatory system: Secondary | ICD-10-CM | POA: Diagnosis not present

## 2020-01-15 DIAGNOSIS — F314 Bipolar disorder, current episode depressed, severe, without psychotic features: Secondary | ICD-10-CM | POA: Diagnosis not present

## 2020-01-15 DIAGNOSIS — Z888 Allergy status to other drugs, medicaments and biological substances status: Secondary | ICD-10-CM | POA: Diagnosis not present

## 2020-01-15 DIAGNOSIS — Z833 Family history of diabetes mellitus: Secondary | ICD-10-CM | POA: Diagnosis not present

## 2020-01-15 NOTE — Progress Notes (Signed)
Virtual Visit via Video Note  I connected with Kimberly Harper on 01/15/20 at  9:00 AM EDT by a video enabled telemedicine application and verified that I am speaking with the correct person using two identifiers.  Client unable to attend appointment.

## 2020-01-16 ENCOUNTER — Other Ambulatory Visit (HOSPITAL_COMMUNITY): Payer: 59 | Admitting: Psychiatry

## 2020-01-16 ENCOUNTER — Encounter (HOSPITAL_COMMUNITY): Payer: Self-pay

## 2020-01-16 ENCOUNTER — Other Ambulatory Visit: Payer: Self-pay

## 2020-01-16 DIAGNOSIS — F3181 Bipolar II disorder: Secondary | ICD-10-CM

## 2020-01-16 DIAGNOSIS — F314 Bipolar disorder, current episode depressed, severe, without psychotic features: Secondary | ICD-10-CM | POA: Diagnosis not present

## 2020-01-16 NOTE — Progress Notes (Signed)
Virtual Visit via Video Note  I connected with Kimberly Harper on 01/16/20 at  9:00 AM EDT by a video enabled telemedicine application and verified that I am speaking with the correct person using two identifiers.  At orientation to the IOP program Case Manager discussed the limitations of evaluation and management by telemedicine and the availability of in person appointments. The patient expressed understanding and agreed to proceed with virtual visits.   Location:  Patient: Patient Home Provider: Home Office   History of Present Illness: Bipolar 2 DO   Observations/Objective: Check In: Case Manager checked in with all participants to review discharge dates, insurance authorizations, work-related documents and needs for the treatment team, including medication review and assessment.    Initial Therapeutic Activity: Counselor facilitated therapeutic processing with group members to assess mood and current functioning, prompting group members to share about application of skills, progress and challenges in treatment/personal lives. Client reports that she is "ok, I guess", stating that she is having GI issues today and experiencing negative cognitions about lack of friends and lonliness. Client noted that she challenged herself for a walk with a friend yesterday and it was enjoyable. Client presents with moderate depression and moderate anxiety. Client denied any current SI/HI/psychosis.   Second Therapeutic Activity: Counselor reviewed Steps 1-4 of the Crisis/Safety Plan with the group. Counselor shared emergency crisis number to add to the form and about access to those services. Group members proceeded to complete the document, including what makes their environment safe, motivations for living and who they plan to share their Crisis Plans with in the coming days and weeks. Client shared that she prefers to make her environment safe by making it dark, with soothing music by herself, or  with her mom present to comfort her.   Check Out: Counselor closed program prompting group members to share one self-care practice or productivity activity they plan to engage in today. Client plans to attend a [place] Festival this weekend out of town, hoping a friend will join.  Assessment and Plan: Clinician recommends that Client remain in IOP treatment to better manage mental health symptoms, stabilization and to address treatment plan goals. Clinician recommends adherence to crisis/safety plan, taking medications as prescribed, and following up with medical professionals if any issues arise.   Follow Up Instructions: Clinician will send Webex link for next session. The Client was advised to call back or seek an in-person evaluation if the symptoms worsen or if the condition fails to improve as anticipated.     I provided 180 minutes of non-face-to-face time during this encounter.     Hilbert Odor, LCSW

## 2020-01-19 ENCOUNTER — Encounter: Payer: Self-pay | Admitting: Registered Nurse

## 2020-01-19 ENCOUNTER — Other Ambulatory Visit: Payer: Self-pay

## 2020-01-19 ENCOUNTER — Ambulatory Visit (INDEPENDENT_AMBULATORY_CARE_PROVIDER_SITE_OTHER): Payer: 59 | Admitting: Registered Nurse

## 2020-01-19 VITALS — BP 95/63 | HR 73 | Temp 98.2°F | Resp 17 | Ht 65.0 in

## 2020-01-19 DIAGNOSIS — Z5320 Procedure and treatment not carried out because of patient's decision for unspecified reasons: Secondary | ICD-10-CM

## 2020-01-19 DIAGNOSIS — R21 Rash and other nonspecific skin eruption: Secondary | ICD-10-CM | POA: Diagnosis not present

## 2020-01-19 DIAGNOSIS — Z13220 Encounter for screening for lipoid disorders: Secondary | ICD-10-CM | POA: Diagnosis not present

## 2020-01-19 DIAGNOSIS — Z1329 Encounter for screening for other suspected endocrine disorder: Secondary | ICD-10-CM | POA: Diagnosis not present

## 2020-01-19 DIAGNOSIS — Z8639 Personal history of other endocrine, nutritional and metabolic disease: Secondary | ICD-10-CM

## 2020-01-19 DIAGNOSIS — Z13228 Encounter for screening for other metabolic disorders: Secondary | ICD-10-CM

## 2020-01-19 DIAGNOSIS — R635 Abnormal weight gain: Secondary | ICD-10-CM

## 2020-01-19 DIAGNOSIS — T7840XA Allergy, unspecified, initial encounter: Secondary | ICD-10-CM

## 2020-01-19 DIAGNOSIS — Z130 Encounter for screening for diseases of the blood and blood-forming organs and certain disorders involving the immune mechanism: Secondary | ICD-10-CM

## 2020-01-19 DIAGNOSIS — Z7689 Persons encountering health services in other specified circumstances: Secondary | ICD-10-CM

## 2020-01-19 MED ORDER — FLUTICASONE PROPIONATE 50 MCG/ACT NA SUSP: 2.0000 | Freq: Every day | NASAL | 6 refills | Status: DC

## 2020-01-19 MED ORDER — CETIRIZINE HCL 10 MG PO TABS: 10.0000 mg | ORAL_TABLET | Freq: Every day | ORAL | 11 refills | Status: DC

## 2020-01-19 MED ORDER — CLINDAMYCIN PHOS-BENZOYL PEROX 1-5 % EX GEL: Freq: Every day | CUTANEOUS | 0 refills | Status: DC

## 2020-01-19 MED ORDER — MONTELUKAST SODIUM 10 MG PO TABS: 10.0000 mg | ORAL_TABLET | Freq: Every day | ORAL | 3 refills | Status: DC

## 2020-01-19 NOTE — Patient Instructions (Signed)
° ° ° °  If you have lab work done today you will be contacted with your lab results within the next 2 weeks.  If you have not heard from us then please contact us. The fastest way to get your results is to register for My Chart. ° ° °IF you received an x-ray today, you will receive an invoice from [facility]. Please contact [facility] at [phone] with questions or concerns regarding your invoice.  ° °IF you received labwork today, you will receive an invoice from [facility]. Please contact [facility] at [phone] with questions or concerns regarding your invoice.  ° °Our billing staff will not be able to assist you with questions regarding bills from these companies. ° °You will be contacted with the lab results as soon as they are available. The fastest way to get your results is to activate your My Chart account. Instructions are located on the last page of this paperwork. If you have not heard from us regarding the results in 2 weeks, please contact this office. °  ° ° ° °

## 2020-01-19 NOTE — Progress Notes (Signed)
Established Patient Office Visit  Subjective:  Patient ID: Kimberly Harper, female    DOB: [birth year]  Age: 34 y.o. MRN: [id number]  CC:  Chief Complaint  Patient presents with  . New Patient (Initial Visit)    Establish care . Patient states the she has two concerns one is having a rash on top and bottom lip in the corner. Per patient she is new around here and needs a referral for an OBGYN.    HPI Kimberly Harper presents for visit to establish care. Has a number of concerns:  GYN: needs to get established with local women's health provider as she is new to the area. No acute concerns.  Rash on face: mostly around corners of lips. Dry, irritating. No vesicles or distinct lesions. Has been using triamcinolone without relief  Sinus pressure: Has been ongoing since moving into current apt. No relief. Thinks she is allergic to something. Has tried some OTCs and natural remedies without relief.  Weight gain: has been on seroquel in the past, not currently. Unfortunately she gained 60lb on seroquel and stopped, however, she is now noting that it seemed to be the most effective treatment. Interested in weight loss therapy in order to go back on seroquel without becoming depressed about her weight.  No other concerns. Feeling well overall. Follows with counseling and psychiatry.  Past Medical History:  Diagnosis Date  . Anemia    [facility] 01/17/2020  . Anxiety   . Bipolar 1 disorder (HCC)   . Blood transfusion without reported diagnosis    [facility] 01/17/2020  . Depression    [facility] 01/17/2020  . Major depressive disorder   . Substance abuse (HCC)    [facility] 01/17/2020    Past Surgical History:  Procedure Laterality Date  . CESAREAN SECTION    . CESAREAN SECTION N/A    [facility] 01/17/2020  . DILATION AND CURETTAGE OF UTERUS      Family History  Problem Relation Age of Onset  . Hypertension Mother   . Diabetes Mother   . Stroke Mother     Social  History   Socioeconomic History  . Marital status: Single    Spouse name: Not on file  . Number of children: Not on file  . Years of education: Not on file  . Highest education level: Not on file  Occupational History  . Not on file  Tobacco Use  . Smoking status: Never Smoker  . Smokeless tobacco: Never Used  Substance and Sexual Activity  . Alcohol use: Yes    Comment: socially  . Drug use: Never  . Sexual activity: Yes    Birth control/protection: None  Other Topics Concern  . Not on file  Social History Narrative  . Not on file   Social Determinants of Health   Financial Resource Strain:   . Difficulty of Paying Living Expenses:   Food Insecurity:   . Worried About Programme researcher, broadcasting/film/video in the Last Year:   . Barista in the Last Year:   Transportation Needs:   . Freight forwarder (Medical):   Marland Kitchen Lack of Transportation (Non-Medical):   Physical Activity:   . Days of Exercise per Week:   . Minutes of Exercise per Session:   Stress:   . Feeling of Stress :   Social Connections:   . Frequency of Communication with Friends and Family:   . Frequency of Social Gatherings with Friends and Family:   . Attends Religious  Services:   . Active Member of Clubs or Organizations:   . Attends Banker Meetings:   Marland Kitchen Marital Status:   Intimate Partner Violence:   . Fear of Current or Ex-Partner:   . Emotionally Abused:   Marland Kitchen Physically Abused:   . Sexually Abused:     Outpatient Medications Prior to Visit  Medication Sig Dispense Refill  . ARIPiprazole (ABILIFY) 10 MG tablet Take 1 tablet (10 mg total) by mouth daily. 30 tablet 2  . busPIRone (BUSPAR) 5 MG tablet Take 1 tablet (5 mg total) by mouth 3 (three) times daily. 90 tablet 0  . gabapentin (NEURONTIN) 300 MG capsule Take 1 capsule (300 mg total) by mouth 3 (three) times daily. 90 capsule 0  . hydrOXYzine (ATARAX/VISTARIL) 25 MG tablet Take 1 tablet (25 mg total) by mouth 3 (three) times daily as  needed for anxiety. 30 tablet 0  . mirtazapine (REMERON) 15 MG tablet Take 1 tablet (15 mg total) by mouth at bedtime. 30 tablet 0  . traZODone (DESYREL) 50 MG tablet Take 1 tablet (50 mg total) by mouth at bedtime as needed for sleep. 30 tablet 0   No facility-administered medications prior to visit.    Allergies  Allergen Reactions  . Multiple Vitamins-Iron Swelling    States that vitamins and iron have caused swelling of tongue and gums    ROS Review of Systems  Constitutional: Negative.   HENT: Negative.   Eyes: Negative.   Respiratory: Negative.   Cardiovascular: Negative.   Gastrointestinal: Negative.   Endocrine: Negative.   Genitourinary: Negative.   Musculoskeletal: Negative.   Skin: Negative.   Allergic/Immunologic: Negative.   Neurological: Negative.   Hematological: Negative.   Psychiatric/Behavioral: Negative.   All other systems reviewed and are negative.     Objective:    Physical Exam Vitals and nursing note reviewed.  Constitutional:      General: She is not in acute distress.    Appearance: Normal appearance. She is normal weight. She is not ill-appearing, toxic-appearing or diaphoretic.  Cardiovascular:     Rate and Rhythm: Normal rate and regular rhythm.     Pulses: Normal pulses.     Heart sounds: No murmur heard.   Pulmonary:     Effort: Pulmonary effort is normal. No respiratory distress.  Skin:    Capillary Refill: Capillary refill takes less than 2 seconds.  Neurological:     General: No focal deficit present.     Mental Status: She is alert and oriented to person, place, and time. Mental status is at baseline.  Psychiatric:        Mood and Affect: Mood normal.        Behavior: Behavior normal.        Thought Content: Thought content normal.        Judgment: Judgment normal.     BP 95/63   Pulse 73   Temp 98.2 F (36.8 C) (Temporal)   Resp 17   Ht 5\' 5"  (1.651 m)   LMP 01/14/2020   SpO2 100%   BMI 26.46 kg/m  Wt Readings  from Last 3 Encounters:  12/07/19 159 lb (72.1 kg)     There are no preventive care reminders to display for this patient.  There are no preventive care reminders to display for this patient.  Lab Results  Component Value Date   TSH 0.646 12/07/2019   Lab Results  Component Value Date   WBC 9.5 12/07/2019   HGB 11.1 (  L) 12/07/2019   HCT 35.1 (L) 12/07/2019   MCV 81.8 12/07/2019   PLT 311 12/07/2019   Lab Results  Component Value Date   NA 139 12/07/2019   K 3.3 (L) 12/07/2019   CO2 25 12/07/2019   GLUCOSE 141 (H) 12/07/2019   BUN 9 12/07/2019   CREATININE 0.90 12/07/2019   BILITOT 0.2 (L) 12/07/2019   ALKPHOS 53 12/07/2019   AST 20 12/07/2019   ALT 17 12/07/2019   PROT 7.3 12/07/2019   ALBUMIN 4.2 12/07/2019   CALCIUM 9.1 12/07/2019   ANIONGAP 8 12/07/2019   Lab Results  Component Value Date   CHOL 189 12/07/2019   Lab Results  Component Value Date   HDL 39 (L) 12/07/2019   Lab Results  Component Value Date   LDLCALC 141 (H) 12/07/2019   Lab Results  Component Value Date   TRIG 47 12/07/2019   Lab Results  Component Value Date   CHOLHDL 4.8 12/07/2019   Lab Results  Component Value Date   HGBA1C 5.3 12/07/2019      Assessment & Plan:   Problem List Items Addressed This Visit    None    Visit Diagnoses    Papanicolaou smear declined    -  Primary   Relevant Orders   Ambulatory referral to Gynecology   Screening for endocrine, metabolic and immunity disorder       Relevant Orders   CBC With Differential   Comprehensive metabolic panel   TSH   Hemoglobin A1c   Lipid screening       Relevant Orders   Lipid panel   Rash of face       Abnormal weight gain       Relevant Orders   CBC With Differential   Hemoglobin A1c   History of elevated glucose       Relevant Orders   Hemoglobin A1c   Encounter to establish care       Allergy, initial encounter       Relevant Medications   cetirizine (ZYRTEC) 10 MG tablet   montelukast  (SINGULAIR) 10 MG tablet   fluticasone (FLONASE) 50 MCG/ACT nasal spray   Other Relevant Orders   Ambulatory referral to Allergy      Meds ordered this encounter  Medications  . cetirizine (ZYRTEC) 10 MG tablet    Sig: Take 1 tablet (10 mg total) by mouth daily.    Dispense:  30 tablet    Refill:  11    Order Specific Question:   Supervising Provider    Answer:   Neva Seat, JEFFREY R [[id number]]  . montelukast (SINGULAIR) 10 MG tablet    Sig: Take 1 tablet (10 mg total) by mouth at bedtime.    Dispense:  30 tablet    Refill:  3    Order Specific Question:   Supervising Provider    Answer:   Neva Seat, JEFFREY R [[id number]]  . fluticasone (FLONASE) 50 MCG/ACT nasal spray    Sig: Place 2 sprays into both nostrils daily.    Dispense:  16 g    Refill:  6    Order Specific Question:   Supervising Provider    Answer:   Neva Seat, JEFFREY R [[id number]]    Follow-up: No follow-ups on file.   PLAN  Rash on face is acne vs perioral dermatitis, likely worsened by triamcinolone. Stop using this and use benzoyl peroxide instead  Start cetirizine, fluticasone, and montelukast for allergies. Refer to allergy for allergy testing. Return prn  Refer to gyn to est care  Will reach out to psych regarding weight loss options  Labs collected, will follow up as warranted  Patient encouraged to call clinic with any questions, comments, or concerns.  Maximiano Coss, NP

## 2020-01-20 LAB — LIPID PANEL
Chol/HDL Ratio: 4 ratio (ref 0.0–4.4)
Cholesterol, Total: 189 mg/dL (ref 100–199)
HDL: 47 mg/dL (ref >39)
LDL Chol Calc (NIH): 122 mg/dL — ABNORMAL HIGH (ref 0–99)
Triglycerides: 109 mg/dL (ref 0–149)
VLDL Cholesterol Cal: 20 mg/dL (ref 5–40)

## 2020-01-20 LAB — COMPREHENSIVE METABOLIC PANEL
ALT: 13 IU/L (ref 0–32)
AST: 16 IU/L (ref 0–40)
Albumin/Globulin Ratio: 1.5 (ref 1.2–2.2)
Albumin: 4.3 g/dL (ref 3.8–4.8)
Alkaline Phosphatase: 64 IU/L (ref 48–121)
BUN/Creatinine Ratio: 10 (ref 9–23)
BUN: 10 mg/dL (ref 6–20)
Bilirubin Total: 0.4 mg/dL (ref 0.0–1.2)
CO2: 27 mmol/L (ref 20–29)
Calcium: 9.5 mg/dL (ref 8.7–10.2)
Chloride: 104 mmol/L (ref 96–106)
Creatinine, Ser: 1.05 mg/dL — ABNORMAL HIGH (ref 0.57–1.00)
GFR calc Af Amer: 80 mL/min/{1.73_m2} (ref >59)
GFR calc non Af Amer: 69 mL/min/{1.73_m2} (ref >59)
Globulin, Total: 2.9 g/dL (ref 1.5–4.5)
Glucose: 98 mg/dL (ref 65–99)
Potassium: 4.4 mmol/L (ref 3.5–5.2)
Sodium: 140 mmol/L (ref 134–144)
Total Protein: 7.2 g/dL (ref 6.0–8.5)

## 2020-01-20 LAB — CBC WITH DIFFERENTIAL
Basophils Absolute: 0.1 10*3/uL (ref 0.0–0.2)
Basos: 1 %
EOS (ABSOLUTE): 0.2 10*3/uL (ref 0.0–0.4)
Eos: 2 %
Hematocrit: 37 % (ref 34.0–46.6)
Hemoglobin: 11.5 g/dL (ref 11.1–15.9)
Immature Grans (Abs): 0 10*3/uL (ref 0.0–0.1)
Immature Granulocytes: 0 %
Lymphocytes Absolute: 2.4 10*3/uL (ref 0.7–3.1)
Lymphs: 21 %
MCH: 25.6 pg — ABNORMAL LOW (ref 26.6–33.0)
MCHC: 31.1 g/dL — ABNORMAL LOW (ref 31.5–35.7)
MCV: 82 fL (ref 79–97)
Monocytes Absolute: 0.8 10*3/uL (ref 0.1–0.9)
Monocytes: 7 %
Neutrophils Absolute: 7.5 10*3/uL — ABNORMAL HIGH (ref 1.4–7.0)
Neutrophils: 69 %
RBC: 4.5 x10E6/uL (ref 3.77–5.28)
RDW: 14.7 % (ref 11.7–15.4)
WBC: 11 10*3/uL — ABNORMAL HIGH (ref 3.4–10.8)

## 2020-01-20 LAB — HEMOGLOBIN A1C
Est. average glucose Bld gHb Est-mCnc: 105 mg/dL
Hgb A1c MFr Bld: 5.3 % (ref 4.8–5.6)

## 2020-01-20 LAB — TSH: TSH: 1.02 u[IU]/mL (ref 0.450–4.500)

## 2020-01-20 NOTE — Progress Notes (Signed)
Virtual Visit via Telephone Note  I connected with Kimberly Harper on @TODAY @ at 1400 by telephone and verified that I am speaking with the correct person using two identifiers.   I discussed the limitations, risks, security and privacy concerns of performing an evaluation and management service by telephone and the availability of in person appointments. I also discussed with the patient that there may be a patient responsible charge related to this service. The patient expressed understanding and agreed to proceed. I discussed the assessment and treatment plan with the patient. The patient was provided an opportunity to ask questions and all were answered. The patient agreed with the plan and demonstrated an understanding of the instructions.   The patient was advised to call back or seek an in-person evaluation if the symptoms worsen or if the condition fails to improve as anticipated.  I provided 20 minutes of non-face-to-face time during this encounter.   Patient ID: Kimberly Harper, female   DOB: 10-Dec-1985, 34 y.o.   MRN: [id number] As per previous CCA states: Pt reports per inpt. Pt was inpt due to increased SI. Pt reports multiple hospitalizations- about 1 per year since [age]yr old due to SI. Pt reports not having current providers due to moving to [place] from [place] in Sept 2020. Pt states she has been unable to get providers due to long wait lists and insurance. Pt reports she is having a mixed episode of mania and depression. Pt reports the following stressors: 1) Health: Pt states she is dx with Bipolar disorder and trying to figure out what life looks like with that dx. Pt reports not having providers is very stressful. 2) Anger: pt reports increased anger due to not taking meds/not being on the correct meds. 3) Son: 66yo son is not doing well in school. Pt reports she has tried to help and son doesn't want it. Pt is stepping back from help. 4) Family: Pt's mother had a stroke and lives  part time with pt and pt's son. 5) Finances: Pt shares finances are tight. Pt is concerned about her job situation since going inpt.  Pt transitioned from PHP to MH-IOP today.  Pt only attended PHP for one day due to prior obligations per pt.  Pt states she is looking forward to attending MH-IOP.    Patient attended all scheduled days, except for three days.  Towards the end of her attending, pt c/o GI issues.  Pt struggled with mood lability while in MH-IOP.  Pt has been applying coping skills (ie. Setting boundaries).  C/O increased loneliness, depression, low self-worth and low self-esteem.  Denies SI/HI or A/V hallucinations.  A:  D/C pt today.  F/U with Dr. 34yo on 02-06-20 @ 9 a.m and 05-18-2001, Elnita Maxwell on 01-20-20 @ 4pm.  Encouraged support groups; especially anger management and DBT.  RTW on 01-28-20; without any restrictions.  R:  Pt receptive.  03-09-2001, M.Ed,CNA

## 2020-01-20 NOTE — Patient Instructions (Signed)
D:  Patient completed MH-IOP today.  A:  Follow up with Dr. Hinton Dyer on 02-06-20 @ 9 a.m and Elnita Maxwell, LCSW on 01-20-20 @ 4pm.  Encouraged support groups.  R:  Patient receptive.

## 2020-01-22 ENCOUNTER — Encounter: Payer: Self-pay | Admitting: Family

## 2020-01-23 ENCOUNTER — Encounter: Payer: Self-pay | Admitting: Registered Nurse

## 2020-01-23 MED ORDER — ARIPIPRAZOLE 10 MG PO TABS: 10.0000 mg | ORAL_TABLET | Freq: Every day | ORAL | 2 refills | Status: DC

## 2020-01-23 MED ORDER — BUSPIRONE HCL 5 MG PO TABS: 5.0000 mg | ORAL_TABLET | Freq: Three times a day (TID) | ORAL | Status: DC

## 2020-01-23 MED ORDER — BUSPIRONE HCL 5 MG PO TABS
5.0000 mg | ORAL_TABLET | Freq: Three times a day (TID) | ORAL | Status: DC
Start: 2020-01-23 — End: 2020-02-06

## 2020-01-23 MED ORDER — GABAPENTIN 300 MG PO CAPS
300.0000 mg | ORAL_CAPSULE | Freq: Three times a day (TID) | ORAL | Status: DC
Start: 2020-01-23 — End: 2020-02-06

## 2020-01-23 NOTE — Telephone Encounter (Signed)
Patient states the Benzaclin is too expensive and she doesn't have any refill. Please Advise.

## 2020-01-23 NOTE — Addendum Note (Signed)
Addended by: Caryn Bee on: 01/23/2020 07:47 PM   Modules accepted: Orders

## 2020-01-28 ENCOUNTER — Telehealth: Payer: Self-pay

## 2020-01-28 NOTE — Telephone Encounter (Signed)
The following are on the formulary in place of Benzaclin gel, which pt says is too expensive. Per insurance formulary : Clindamycin phos. Ext. Gel or Lotion  1%,, Erythromycin ext. 2% gel, adapalene benz. Perox. 0.1/ 2.5% gel, or differin gel 0.1%.

## 2020-01-30 ENCOUNTER — Other Ambulatory Visit: Payer: Self-pay | Admitting: Registered Nurse

## 2020-01-30 DIAGNOSIS — R21 Rash and other nonspecific skin eruption: Secondary | ICD-10-CM

## 2020-01-30 MED ORDER — CLINDAMYCIN PHOSPHATE 1 % EX LOTN: TOPICAL_LOTION | Freq: Two times a day (BID) | CUTANEOUS | 0 refills | Status: DC

## 2020-01-30 NOTE — Telephone Encounter (Signed)
Sent alternative  Thanks,  Jari Sportsman, NP

## 2020-02-06 ENCOUNTER — Telehealth (INDEPENDENT_AMBULATORY_CARE_PROVIDER_SITE_OTHER): Payer: 59 | Admitting: Psychiatry

## 2020-02-06 ENCOUNTER — Encounter (HOSPITAL_COMMUNITY): Payer: Self-pay | Admitting: Psychiatry

## 2020-02-06 ENCOUNTER — Other Ambulatory Visit: Payer: Self-pay

## 2020-02-06 DIAGNOSIS — F340 Cyclothymic disorder: Secondary | ICD-10-CM | POA: Diagnosis not present

## 2020-02-06 DIAGNOSIS — F603 Borderline personality disorder: Secondary | ICD-10-CM | POA: Diagnosis not present

## 2020-02-06 MED ORDER — BUSPIRONE HCL 10 MG PO TABS: 5.0000 mg | ORAL_TABLET | Freq: Two times a day (BID) | ORAL | 1 refills | Status: DC

## 2020-02-06 MED ORDER — GABAPENTIN 800 MG PO TABS: 800.0000 mg | ORAL_TABLET | Freq: Two times a day (BID) | ORAL | 1 refills | Status: DC
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
## 2020-02-07 ENCOUNTER — Other Ambulatory Visit: Payer: Self-pay

## 2020-02-07 ENCOUNTER — Ambulatory Visit
Admission: EM | Admit: 2020-02-07 | Discharge: 2020-02-07 | Disposition: A | Discharge status: Home / Self Care | Payer: 59 | Attending: Emergency Medicine | Admitting: Emergency Medicine

## 2020-02-07 ENCOUNTER — Encounter: Payer: Self-pay | Admitting: Emergency Medicine

## 2020-02-07 DIAGNOSIS — Z3202 Encounter for pregnancy test, result negative: Secondary | ICD-10-CM

## 2020-02-07 LAB — POCT URINE PREGNANCY: Preg Test, Ur: NEGATIVE

## 2020-02-07 NOTE — Discharge Instructions (Addendum)
Recommend taking prenatal vitamin once daily.

## 2020-02-07 NOTE — ED Triage Notes (Signed)
APP assessment prior to RN triage.  Please see provider note.   

## 2020-02-07 NOTE — ED Provider Notes (Signed)
[name]    CSN: [id number] Arrival date & time: 02/07/20  1031      History   Chief Complaint Chief Complaint  Patient presents with  . Possible Pregnancy  . Abdominal Pain    HPI Kimberly Harper is a 34 y.o. female presenting for pregnancy test.  Patient currently sexually active, not using condoms.  No contraceptive or prenatal vitamin use.  Has been pregnant in the past, states that she has been concerned that she is pregnant lately due to feeling nauseous.  Patient unsure if it could be related to anxiety.  Denies SI/HI.  LMP 01/14/2020.  Denying vaginal pain, pelvic pain, discharge, urinary symptoms, vomiting.    Past Medical History:  Diagnosis Date  . Anemia    Phreesia 01/17/2020  . Anxiety   . Bipolar 1 disorder (HCC)   . Blood transfusion without reported diagnosis    Phreesia 01/17/2020  . Depression    Phreesia 01/17/2020  . Major depressive disorder   . Substance abuse (HCC)    Phreesia 01/17/2020    Patient Active Problem List   Diagnosis Date Noted  . 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 Borderline personality disorder (HCC) 02/06/2020  . Bipolar 2 disorder, major depressive episode (HCC) 12/06/2019    Past Surgical History:  Procedure Laterality Date  . CESAREAN SECTION    . CESAREAN SECTION N/A    Phreesia 01/17/2020  . DILATION AND CURETTAGE OF UTERUS      OB History   No obstetric history on file.      Home Medications    Prior to Admission medications   Medication Sig Start Date End Date Taking? Authorizing Provider  busPIRone (BUSPAR) 10 MG tablet Take 0.5 tablets (5 mg total) by mouth 2 (two) times daily. 02/06/20 04/06/20  Pucilowski, Roosvelt Maser, MD  cetirizine (ZYRTEC) 10 MG tablet Take 1 tablet (10 mg total) by mouth daily. 01/19/20   Janeece Agee, NP  clindamycin-benzoyl peroxide [facility]) gel Apply topically daily. 01/19/20   Janeece Agee, NP  fluticasone (FLONASE) 50 MCG/ACT nasal spray Place 2 sprays  into both nostrils daily. 01/19/20   Janeece Agee, NP  gabapentin (NEURONTIN) 800 MG tablet Take 1 tablet (800 mg total) by mouth 2 (two) times daily. 02/06/20 04/06/20  Pucilowski, Roosvelt Maser, MD  mirtazapine (REMERON) 15 MG tablet Take 1 tablet (15 mg total) by mouth at bedtime. 12/12/19   Money, Gerlene Burdock, FNP  traZODone (DESYREL) 50 MG tablet Take 1 tablet (50 mg total) by mouth at bedtime as needed for sleep. 12/12/19   Money, Gerlene Burdock, FNP  montelukast (SINGULAIR) 10 MG tablet Take 1 tablet (10 mg total) by mouth at bedtime. 01/19/20 02/07/20  Janeece Agee, NP    Family History Family History  Problem Relation Age of Onset  . Hypertension Mother   . Diabetes Mother   . Stroke Mother   . Depression Mother   . Depression Sister   . Depression Brother   . Alcohol abuse Maternal Grandfather   . Alcohol abuse Paternal Grandfather     Social History Social History   Tobacco Use  . Smoking status: Never Smoker  . Smokeless tobacco: Never Used  Substance Use Topics  . Alcohol use: Yes    Comment: socially  . Drug use: Never     Allergies   Multiple vitamins-iron   Review of Systems As per HPI   Physical Exam Triage Vital Signs ED Triage Vitals  Enc Vitals Group  BP      Pulse      Resp      Temp      Temp src      SpO2      Weight      Height      Head Circumference      Peak Flow      Pain Score      Pain Loc      Pain Edu?      Excl. in GC?    No data found.  Updated Vital Signs BP (!) 144/85 (BP Location: Left Arm)   Pulse 95   Temp 99.1 F (37.3 C) (Oral)   Resp 16   LMP 01/14/2020   SpO2 98%   Visual Acuity Right Eye Distance:   Left Eye Distance:   Bilateral Distance:    Right Eye Near:   Left Eye Near:    Bilateral Near:     Physical Exam Constitutional:      General: She is not in acute distress. HENT:     Head: Normocephalic and atraumatic.  Eyes:     General: No scleral icterus.    Pupils: Pupils are equal, round, and  reactive to light.  Cardiovascular:     Rate and Rhythm: Normal rate.  Pulmonary:     Effort: Pulmonary effort is normal.  Abdominal:     General: Bowel sounds are normal.     Palpations: Abdomen is soft.     Tenderness: There is no abdominal tenderness. There is no right CVA tenderness, left CVA tenderness or guarding.  Skin:    Coloration: Skin is not jaundiced or pale.  Neurological:     Mental Status: She is alert and oriented to person, place, and time.      UC Treatments / Results  Labs (all labs ordered are listed, but only abnormal results are displayed) Labs Reviewed  POCT URINE PREGNANCY - Normal    EKG   Radiology No results found.  Procedures Procedures (including critical care time)  Medications Ordered in UC Medications - No data to display  Initial Impression / Assessment and Plan / UC Course  I have reviewed the triage vital signs and the nursing notes.  Pertinent labs & imaging results that were available during my care of the patient were reviewed by me and considered in my medical decision making (see chart for details).     Patient appears well in office today.  Patient declining further evaluation or treatment of nausea.  Pregnancy test negative.  Return precautions discussed, pt verbalized understanding and is agreeable to plan. Final Clinical Impressions(s) / UC Diagnoses   Final diagnoses:  Encounter for pregnancy test with result negative     Discharge Instructions     Recommend taking prenatal vitamin once daily.    ED Prescriptions    None     PDMP not reviewed this encounter.   Hall[place]Potvin, [place], [place] 02/07/20 1100

## 2020-02-08 ENCOUNTER — Encounter (HOSPITAL_COMMUNITY): Payer: Self-pay

## 2020-02-08 ENCOUNTER — Ambulatory Visit (HOSPITAL_COMMUNITY)
Admission: EM | Admit: 2020-02-08 | Discharge: 2020-02-08 | Disposition: A | Discharge status: Home / Self Care | Payer: 59 | Attending: Urgent Care | Admitting: Urgent Care

## 2020-02-08 DIAGNOSIS — N898 Other specified noninflammatory disorders of vagina: Secondary | ICD-10-CM | POA: Diagnosis present

## 2020-02-08 DIAGNOSIS — N760 Acute vaginitis: Secondary | ICD-10-CM | POA: Diagnosis present

## 2020-02-08 DIAGNOSIS — B9689 Other specified bacterial agents as the cause of diseases classified elsewhere: Secondary | ICD-10-CM | POA: Insufficient documentation

## 2020-02-08 MED ORDER — CLINDAMYCIN HCL 300 MG PO CAPS: 300.0000 mg | ORAL_CAPSULE | Freq: Three times a day (TID) | ORAL | 0 refills | Status: DC

## 2020-02-08 NOTE — ED Triage Notes (Signed)
Pt presents with white thick vaginal discharge and fishy odor x 1 week. Pt has no tried any medication for the complaints.

## 2020-02-08 NOTE — ED Provider Notes (Signed)
[facility]   MRN: [id number] DOB: 06/13/1986  Subjective:   Kimberly Harper is a 34 y.o. female presenting for 1 week hx of persistent heavy vaginal discharge with a fishy odor.  Patient has a history of BV and states that she is very sensitive to getting this.  She does not have as much concern for STIs but is agreeable to testing.  She did have pregnancy testing done yesterday at our [facility] urgent care but not cervical swab.  She did not mention her current symptoms have been.  Has had some mild intermittent lower belly pains.  No genital rash.  No fever, nausea or vomiting.  No current facility-administered medications for this encounter.  Current Outpatient Medications:  .  busPIRone (BUSPAR) 10 MG tablet, Take 0.5 tablets (5 mg total) by mouth 2 (two) times daily., Disp: 30 tablet, Rfl: 1 .  cetirizine (ZYRTEC) 10 MG tablet, Take 1 tablet (10 mg total) by mouth daily., Disp: 30 tablet, Rfl: 11 .  clindamycin-benzoyl peroxide (BENZACLIN) gel, Apply topically daily., Disp: 25 g, Rfl: 0 .  fluticasone (FLONASE) 50 MCG/ACT nasal spray, Place 2 sprays into both nostrils daily., Disp: 16 g, Rfl: 6 .  gabapentin (NEURONTIN) 800 MG tablet, Take 1 tablet (800 mg total) by mouth 2 (two) times daily., Disp: 60 tablet, Rfl: 1 .  mirtazapine (REMERON) 15 MG tablet, Take 1 tablet (15 mg total) by mouth at bedtime., Disp: 30 tablet, Rfl: 0 .  traZODone (DESYREL) 50 MG tablet, Take 1 tablet (50 mg total) by mouth at bedtime as needed for sleep., Disp: 30 tablet, Rfl: 0   Allergies  Allergen Reactions  . Multiple Vitamins-Iron Swelling    States that vitamins and iron have caused swelling of tongue and gums    Past Medical History:  Diagnosis Date  . Anemia    Phreesia 01/17/2020  . Anxiety   . Bipolar 1 disorder (HCC)   . Blood transfusion without reported diagnosis    Phreesia 01/17/2020  . Depression    Phreesia 01/17/2020  . Major depressive disorder   . Substance abuse  (HCC)    Phreesia 01/17/2020     Past Surgical History:  Procedure Laterality Date  . CESAREAN SECTION    . CESAREAN SECTION N/A    Phreesia 01/17/2020  . DILATION AND CURETTAGE OF UTERUS      Family History  Problem Relation Age of Onset  . Hypertension Mother   . Diabetes Mother   . Stroke Mother   . Depression Mother   . Depression Sister   . Depression Brother   . Alcohol abuse Maternal Grandfather   . Alcohol abuse Paternal Grandfather     Social History   Tobacco Use  . Smoking status: Never Smoker  . Smokeless tobacco: Never Used  Substance Use Topics  . Alcohol use: Yes    Comment: socially  . Drug use: Never    ROS   Objective:   Vitals: BP 107/72 (BP Location: Left Arm)   Pulse 69   Temp 98.8 F (37.1 C) (Oral)   Resp 17   LMP 01/14/2020 (Exact Date)   SpO2 98%   Physical Exam Constitutional:      General: She is not in acute distress.    Appearance: Normal appearance. She is well-developed. She is not ill-appearing.  HENT:     Head: Normocephalic and atraumatic.     Nose: Nose normal.     Mouth/Throat:     Mouth: Mucous membranes  are moist.     Pharynx: Oropharynx is clear.  Eyes:     General: No scleral icterus.    Extraocular Movements: Extraocular movements intact.     Pupils: Pupils are equal, round, and reactive to light.  Cardiovascular:     Rate and Rhythm: Normal rate.  Pulmonary:     Effort: Pulmonary effort is normal.  Abdominal:     General: Bowel sounds are normal. There is no distension.     Palpations: Abdomen is soft. There is no mass.     Tenderness: There is abdominal tenderness (Lower abdominal). There is no right CVA tenderness, left CVA tenderness, guarding or rebound.  Skin:    General: Skin is warm and dry.  Neurological:     General: No focal deficit present.     Mental Status: She is alert and oriented to person, place, and time.  Psychiatric:        Mood and Affect: Mood normal.        Behavior: Behavior  normal.     Results for orders placed or performed during the hospital encounter of 02/07/20 (from the past 72 hour(s))  POCT urine pregnancy     Status: Normal   Collection Time: 02/07/20 10:54 AM  Result Value Ref Range   Preg Test, Ur Negative Negative    Assessment and Plan :   PDMP not reviewed this encounter.  1. Vaginal discharge   2. Bacterial vaginosis     Despite abdominal tenderness on exam, vital signs stable and patient is very well-appearing outside of being examined.  We will hold off on PID treatment.  Cover for bacterial vaginosis with clindamycin.  Patient requests a different medication and Flagyl as it did not work for her in the past. Counseled patient on potential for adverse effects with medications prescribed/recommended today, ER and return-to-clinic precautions discussed, patient verbalized understanding.    Wallis Bamberg, [place] 02/08/20 1826

## 2020-02-09 LAB — CERVICOVAGINAL ANCILLARY ONLY
Bacterial Vaginitis (gardnerella): POSITIVE — AB
Candida Glabrata: NEGATIVE
Candida Vaginitis: NEGATIVE
Chlamydia: NEGATIVE
Comment: NEGATIVE
Comment: NEGATIVE
Comment: NEGATIVE
Comment: NEGATIVE
Comment: NEGATIVE
Comment: NORMAL
Neisseria Gonorrhea: NEGATIVE
Trichomonas: NEGATIVE

## 2020-02-17 ENCOUNTER — Ambulatory Visit (HOSPITAL_COMMUNITY): Payer: 59 | Admitting: Psychiatry

## 2020-02-17 ENCOUNTER — Ambulatory Visit: Payer: Self-pay | Admitting: Nurse Practitioner

## 2020-02-17 DIAGNOSIS — Z0289 Encounter for other administrative examinations: Secondary | ICD-10-CM

## 2020-02-24 ENCOUNTER — Encounter (HOSPITAL_COMMUNITY): Payer: Self-pay | Admitting: Psychiatry

## 2020-02-24 ENCOUNTER — Ambulatory Visit (INDEPENDENT_AMBULATORY_CARE_PROVIDER_SITE_OTHER): Payer: 59 | Admitting: Psychiatry

## 2020-02-24 DIAGNOSIS — F340 Cyclothymic disorder: Secondary | ICD-10-CM

## 2020-02-24 NOTE — Progress Notes (Signed)
Virtual Visit via Video Note  I connected with [name] on 02/24/20 at 12:00 PM EDT by a video enabled telemedicine application and verified that I am speaking with the correct person using two identifiers.  GROUP GOAL: Client will attend IOP Aftercare Group Therapy 2-4x a month to connect with peers and to apply strategies discussed to improve mental health condition.  Location: Kimberly Harper: [facility]ly [facility] Home Provider: Home Office   History of Present Illness: Cyclothymic DO  Observations/Objective: Counselor met with Kimberly Harper in the context of Group Therapy, via Webex. Counselor prompted Kimberly Harper to share updates on coping skill application and individual therapeutic process in management of mental health. Client reports that she feels ready to return to work and is hoping that her provider will approve for her return next week. Client notes feeling more regulated on medications.  Counselor prompted Kimberly Harper to share areas of concern, challenges and identify barriers to meeting current goals. Client discussed concerns with functioning in relation to her disordered eating habits, causing her to be weak and lack energy. Counselor recommends that Client discuss specialized treatment with individual provider. Topics covered: anger management, substance use complications, positive coping skills, health complications, setting boundaries, eating disorders and faith/religion impacting treatment. Kimberly Harper engaged in discussion, provided feedback for others within the group and took note of additional strategies reviewed and discussed.   Assessment and Plan: Counselor recommends client continue following treatment plan goals, following crisis plan and following up with behavioral health and medical providers as needed. Kimberly Harper is welcome to join group at next session.   Follow Up Instructions: Counselor to provide link for next session via Webex platform. The Kimberly Harper was advised to call back or seek an  in-person evaluation if the symptoms worsen or if the condition fails to improve as anticipated.  I provided 70 minutes of non-face-to-face time during this encounter.   Lise Auer, LCSW

## 2020-03-07 ENCOUNTER — Other Ambulatory Visit: Payer: Self-pay

## 2020-03-07 ENCOUNTER — Ambulatory Visit
Admission: EM | Admit: 2020-03-07 | Discharge: 2020-03-07 | Disposition: A | Discharge status: Home / Self Care | Payer: 59 | Attending: Emergency Medicine | Admitting: Emergency Medicine

## 2020-03-07 ENCOUNTER — Encounter: Payer: Self-pay | Admitting: Emergency Medicine

## 2020-03-07 DIAGNOSIS — N898 Other specified noninflammatory disorders of vagina: Secondary | ICD-10-CM | POA: Diagnosis present

## 2020-03-07 MED ORDER — FLUCONAZOLE 150 MG PO TABS
150.0000 mg | ORAL_TABLET | Freq: Every day | ORAL | 0 refills | Status: DC
Start: 2020-03-07 — End: 2020-03-10

## 2020-03-07 NOTE — ED Triage Notes (Signed)
Pt here for white vaginal discharge that she thinks is yeast infection

## 2020-03-07 NOTE — ED Provider Notes (Signed)
[name]    CSN: [id number] Arrival date & time: 03/07/20  1226      History   Chief Complaint Chief Complaint  Patient presents with  . Vaginal Discharge    HPI Kimberly Harper is a 34 y.o. female  presenting for STI testing, yeast treatment.  History limited due to patient cooperation.  States she has had thin white discharge with pruritus: Feels like previous yeast infections.  Patient also wanting "testing for everything ".    Past Medical History:  Diagnosis Date  . Anemia    [facility]reesia 01/17/2020  . Anxiety   . Bipolar 1 disorder (HCC)   . Blood transfusion without reported diagnosis    [facility]reesia 01/17/2020  . Depression    [facility]reesia 01/17/2020  . Major depressive disorder   . Substance abuse (HCC)    Phreesia 01/17/2020    Patient Active Problem List   Diagnosis Date Noted  . 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 Borderline personality disorder (HCC) 02/06/2020  . Bipolar 2 disorder, major depressive episode (HCC) 12/06/2019    Past Surgical History:  Procedure Laterality Date  . CESAREAN SECTION    . CESAREAN SECTION N/A    Phreesia 01/17/2020  . DILATION AND CURETTAGE OF UTERUS      OB History   No obstetric history on file.      Home Medications    Prior to Admission medications   Medication Sig Start Date End Date Taking? Authorizing Provider  busPIRone (BUSPAR) 10 MG tablet Take 0.5 tablets (5 mg total) by mouth 2 (two) times daily. 02/06/20 04/06/20  Pucilowski, Roosvelt Maser, MD  cetirizine (ZYRTEC) 10 MG tablet Take 1 tablet (10 mg total) by mouth daily. 01/19/20   Janeece Agee, NP  clindamycin (CLEOCIN) 300 MG capsule Take 1 capsule (300 mg total) by mouth 3 (three) times daily. Patient not taking: Reported on 03/07/2020 02/08/20   Wallis Bamberg, PA-C  clindamycin-benzoyl peroxide [facility]) gel Apply topically daily. Patient not taking: Reported on 03/07/2020 01/19/20   Janeece Agee, NP  fluconazole (DIFLUCAN) 150 MG tablet  Take 1 tablet (150 mg total) by mouth daily. May repeat in 72 hours if needed 03/07/20   Hall-Potvin, [place], PA-C  fluticasone (FLONASE) 50 MCG/ACT nasal spray Place 2 sprays into both nostrils daily. 01/19/20   Janeece Agee, NP  gabapentin (NEURONTIN) 800 MG tablet Take 1 tablet (800 mg total) by mouth 2 (two) times daily. 02/06/20 04/06/20  Pucilowski, Roosvelt Maser, MD  mirtazapine (REMERON) 15 MG tablet Take 1 tablet (15 mg total) by mouth at bedtime. 12/12/19   Money, Gerlene Burdock, FNP  traZODone (DESYREL) 50 MG tablet Take 1 tablet (50 mg total) by mouth at bedtime as needed for sleep. 12/12/19   Money, Gerlene Burdock, FNP  montelukast (SINGULAIR) 10 MG tablet Take 1 tablet (10 mg total) by mouth at bedtime. 01/19/20 02/07/20  Janeece Agee, NP    Family History Family History  Problem Relation Age of Onset  . Hypertension Mother   . Diabetes Mother   . Stroke Mother   . Depression Mother   . Depression Sister   . Depression Brother   . Alcohol abuse Maternal Grandfather   . Alcohol abuse Paternal Grandfather     Social History Social History   Tobacco Use  . Smoking status: Never Smoker  . Smokeless tobacco: Never Used  Substance Use Topics  . Alcohol use: Yes    Comment: socially  . Drug use: Never     Allergies  Multiple vitamins-iron   Review of Systems As per HPI   Physical Exam Triage Vital Signs ED Triage Vitals [03/07/20 1303]  Enc Vitals Group     BP 118/78     Pulse Rate 96     Resp 18     Temp 98.7 F (37.1 C)     Temp Source Oral     SpO2 98 %     Weight      Height      Head Circumference      Peak Flow      Pain Score 0     Pain Loc      Pain Edu?      Excl. in GC?    No data found.  Updated Vital Signs BP 118/78 (BP Location: Left Arm)   Pulse 96   Temp 98.7 F (37.1 C) (Oral)   Resp 18   SpO2 98%   Visual Acuity Right Eye Distance:   Left Eye Distance:   Bilateral Distance:    Right Eye Near:   Left Eye Near:    Bilateral Near:      Physical Exam Constitutional:      General: She is not in acute distress. HENT:     Head: Normocephalic and atraumatic.  Eyes:     General: No scleral icterus.    Pupils: Pupils are equal, round, and reactive to light.  Cardiovascular:     Rate and Rhythm: Normal rate.  Pulmonary:     Effort: Pulmonary effort is normal.  Abdominal:     General: Bowel sounds are normal.     Palpations: Abdomen is soft.     Tenderness: There is no abdominal tenderness. There is no right CVA tenderness, left CVA tenderness or guarding.  Genitourinary:    Comments: Patient declined, self-swab performed Skin:    Coloration: Skin is not jaundiced or pale.  Neurological:     Mental Status: She is alert and oriented to person, place, and time.      UC Treatments / Results  Labs (all labs ordered are listed, but only abnormal results are displayed) Labs Reviewed  CERVICOVAGINAL ANCILLARY ONLY    EKG   Radiology No results found.  Procedures Procedures (including critical care time)  Medications Ordered in UC Medications - No data to display  Initial Impression / Assessment and Plan / UC Course  I have reviewed the triage vital signs and the nursing notes.  Pertinent labs & imaging results that were available during my care of the patient were reviewed by me and considered in my medical decision making (see chart for details).     Diflucan sent, cytology pending.  Return precautions discussed, pt verbalized understanding and is agreeable to plan. Final Clinical Impressions(s) / UC Diagnoses   Final diagnoses:  Vaginal discharge   Discharge Instructions   None    ED Prescriptions    Medication Sig Dispense Auth. Provider   fluconazole (DIFLUCAN) 150 MG tablet Take 1 tablet (150 mg total) by mouth daily. May repeat in 72 hours if needed 2 tablet Hall-Potvin, [place], PA-C     PDMP not reviewed this encounter.   Hall-Potvin, [place], [place] 03/07/20 1458

## 2020-03-09 LAB — CERVICOVAGINAL ANCILLARY ONLY
Bacterial Vaginitis (gardnerella): NEGATIVE
Candida Glabrata: NEGATIVE
Candida Vaginitis: POSITIVE — AB
Chlamydia: NEGATIVE
Comment: NEGATIVE
Comment: NEGATIVE
Comment: NEGATIVE
Comment: NEGATIVE
Comment: NEGATIVE
Comment: NORMAL
Neisseria Gonorrhea: NEGATIVE
Trichomonas: NEGATIVE

## 2020-03-10 ENCOUNTER — Encounter: Payer: Self-pay | Admitting: Emergency Medicine

## 2020-03-10 ENCOUNTER — Other Ambulatory Visit: Payer: Self-pay

## 2020-03-10 ENCOUNTER — Telehealth (INDEPENDENT_AMBULATORY_CARE_PROVIDER_SITE_OTHER): Payer: 59 | Admitting: Psychiatry

## 2020-03-10 ENCOUNTER — Ambulatory Visit
Admission: EM | Admit: 2020-03-10 | Discharge: 2020-03-10 | Disposition: A | Discharge status: Home / Self Care | Payer: 59 | Attending: Emergency Medicine | Admitting: Emergency Medicine

## 2020-03-10 DIAGNOSIS — B3731 Acute candidiasis of vulva and vagina: Secondary | ICD-10-CM

## 2020-03-10 DIAGNOSIS — F340 Cyclothymic disorder: Secondary | ICD-10-CM | POA: Diagnosis not present

## 2020-03-10 DIAGNOSIS — F603 Borderline personality disorder: Secondary | ICD-10-CM

## 2020-03-10 DIAGNOSIS — B373 Candidiasis of vulva and vagina: Secondary | ICD-10-CM

## 2020-03-10 MED ORDER — GABAPENTIN 600 MG PO TABS: 1200.0000 mg | ORAL_TABLET | Freq: Two times a day (BID) | ORAL | 1 refills | Status: DC

## 2020-03-10 MED ORDER — FLUCONAZOLE 200 MG PO TABS
200.0000 mg | ORAL_TABLET | ORAL | 0 refills | Status: AC
Start: 2020-03-10 — End: 2020-03-13

## 2020-03-10 MED ORDER — BUSPIRONE HCL 10 MG PO TABS: 20.0000 mg | ORAL_TABLET | Freq: Two times a day (BID) | ORAL | 1 refills | Status: DC

## 2020-03-10 MED ORDER — TRAZODONE HCL 100 MG PO TABS: 100.0000 mg | ORAL_TABLET | Freq: Every evening | ORAL | 1 refills | Status: DC | PRN
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
## 2020-03-10 NOTE — ED Provider Notes (Signed)
[name]    CSN: [id number] Arrival date & time: 03/10/20  1457      History   Chief Complaint Chief Complaint  Patient presents with  . Vaginal Discharge    HPI Kimberly Harper is a 34 y.o. female   Presenting for persistent vaginal discharge and pruritus.  Patient previously seen by me on 8/15 for this: Cytology significant for yeast only.  Completed Diflucan, though still having some itching.  Did schedule GYN appointment for later this month: Intends to keep it.  No pelvic pain, urinary symptoms, fever.  Past Medical History:  Diagnosis Date  . Anemia    Phreesia 01/17/2020  . Anxiety   . Bipolar 1 disorder (HCC)   . Blood transfusion without reported diagnosis    Phreesia 01/17/2020  . Depression    Phreesia 01/17/2020  . Major depressive disorder   . Substance abuse (HCC)    Phreesia 01/17/2020    Patient Active Problem List   Diagnosis Date Noted  . 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 Borderline personality disorder (HCC) 02/06/2020  . Bipolar 2 disorder, major depressive episode (HCC) 12/06/2019    Past Surgical History:  Procedure Laterality Date  . CESAREAN SECTION    . CESAREAN SECTION N/A    Phreesia 01/17/2020  . DILATION AND CURETTAGE OF UTERUS      OB History   No obstetric history on file.      Home Medications    Prior to Admission medications   Medication Sig Start Date End Date Taking? Authorizing Provider  busPIRone (BUSPAR) 10 MG tablet Take 2 tablets (20 mg total) by mouth 2 (two) times daily. 03/10/20 05/09/20  Pucilowski, Roosvelt Maser, MD  cetirizine (ZYRTEC) 10 MG tablet Take 1 tablet (10 mg total) by mouth daily. 01/19/20   Janeece Agee, NP  fluconazole (DIFLUCAN) 200 MG tablet Take 1 tablet (200 mg total) by mouth every other day for 3 days. May repeat in 72 hours if needed 03/10/20 03/13/20  Hall-Potvin, [place], PA-C  fluticasone (FLONASE) 50 MCG/ACT nasal spray Place 2 sprays into both nostrils daily.  01/19/20   Janeece Agee, NP  gabapentin (NEURONTIN) 600 MG tablet Take 2 tablets (1,200 mg total) by mouth 2 (two) times daily. 03/10/20 05/09/20  Pucilowski, Roosvelt Maser, MD  traZODone (DESYREL) 100 MG tablet Take 1 tablet (100 mg total) by mouth at bedtime as needed for sleep. 03/10/20 05/09/20  Pucilowski, Roosvelt Maser, MD  montelukast (SINGULAIR) 10 MG tablet Take 1 tablet (10 mg total) by mouth at bedtime. 01/19/20 02/07/20  Janeece Agee, NP    Family History Family History  Problem Relation Age of Onset  . Hypertension Mother   . Diabetes Mother   . Stroke Mother   . Depression Mother   . Depression Sister   . Depression Brother   . Alcohol abuse Maternal Grandfather   . Alcohol abuse Paternal Grandfather     Social History Social History   Tobacco Use  . Smoking status: Never Smoker  . Smokeless tobacco: Never Used  Substance Use Topics  . Alcohol use: Yes    Comment: socially  . Drug use: Never     Allergies   Multiple vitamins-iron   Review of Systems As per HPI   Physical Exam Triage Vital Signs ED Triage Vitals  Enc Vitals Group     BP      Pulse      Resp      Temp      Temp  src      SpO2      Weight      Height      Head Circumference      Peak Flow      Pain Score      Pain Loc      Pain Edu?      Excl. in GC?    No data found.  Updated Vital Signs BP 131/85 (BP Location: Right Arm)   Pulse 72   Temp 98.8 F (37.1 C) (Oral)   Resp 18   SpO2 99%   Visual Acuity Right Eye Distance:   Left Eye Distance:   Bilateral Distance:    Right Eye Near:   Left Eye Near:    Bilateral Near:     Physical Exam Constitutional:      General: She is not in acute distress. HENT:     Head: Normocephalic and atraumatic.  Eyes:     General: No scleral icterus.    Pupils: Pupils are equal, round, and reactive to light.  Cardiovascular:     Rate and Rhythm: Normal rate.  Pulmonary:     Effort: Pulmonary effort is normal.  Abdominal:      General: Bowel sounds are normal.     Palpations: Abdomen is soft.     Tenderness: There is no abdominal tenderness. There is no right CVA tenderness, left CVA tenderness or guarding.  Skin:    Coloration: Skin is not jaundiced or pale.  Neurological:     Mental Status: She is alert and oriented to person, place, and time.      UC Treatments / Results  Labs (all labs ordered are listed, but only abnormal results are displayed) Labs Reviewed - No data to display  EKG   Radiology No results found.  Procedures Procedures (including critical care time)  Medications Ordered in UC Medications - No data to display  Initial Impression / Assessment and Plan / UC Course  I have reviewed the triage vital signs and the nursing notes.  Pertinent labs & imaging results that were available during my care of the patient were reviewed by me and considered in my medical decision making (see chart for details).     Diflucan extended: Encourage patient to follow-up with GYN.  Return precautions discussed, pt verbalized understanding and is agreeable to plan. Final Clinical Impressions(s) / UC Diagnoses   Final diagnoses:  Yeast vaginitis     Discharge Instructions     Keep follow up with GYN.    ED Prescriptions    Medication Sig Dispense Auth. Provider   fluconazole (DIFLUCAN) 200 MG tablet Take 1 tablet (200 mg total) by mouth every other day for 3 days. May repeat in 72 hours if needed 3 tablet Hall-Potvin, [place], PA-C     PDMP not reviewed this encounter.   Hall-Potvin, [place], [place] 03/10/20 [phone]

## 2020-03-10 NOTE — Discharge Instructions (Signed)
Keep follow up with GYN.

## 2020-03-10 NOTE — ED Triage Notes (Signed)
Pt here for continued vaginal discharge after taking diflucan given here recently

## 2020-03-15 ENCOUNTER — Other Ambulatory Visit: Payer: Self-pay

## 2020-03-15 ENCOUNTER — Emergency Department (HOSPITAL_COMMUNITY): Payer: 59

## 2020-03-15 ENCOUNTER — Emergency Department (HOSPITAL_COMMUNITY)
Admission: EM | Admit: 2020-03-15 | Discharge: 2020-03-16 | Disposition: A | Discharge status: Home / Self Care | Payer: 59 | Attending: Emergency Medicine | Admitting: Emergency Medicine

## 2020-03-15 DIAGNOSIS — Z5321 Procedure and treatment not carried out due to patient leaving prior to being seen by health care provider: Secondary | ICD-10-CM | POA: Diagnosis not present

## 2020-03-15 DIAGNOSIS — R079 Chest pain, unspecified: Secondary | ICD-10-CM | POA: Diagnosis present

## 2020-03-15 LAB — CBC
HCT: 37.7 % (ref 36.0–46.0)
Hemoglobin: 11.8 g/dL — ABNORMAL LOW (ref 12.0–15.0)
MCH: 25.7 pg — ABNORMAL LOW (ref 26.0–34.0)
MCHC: 31.3 g/dL (ref 30.0–36.0)
MCV: 82 fL (ref 80.0–100.0)
Platelets: 316 10*3/uL (ref 150–400)
RBC: 4.6 MIL/uL (ref 3.87–5.11)
RDW: 14.5 % (ref 11.5–15.5)
WBC: 10.8 10*3/uL — ABNORMAL HIGH (ref 4.0–10.5)
nRBC: 0 % (ref 0.0–0.2)

## 2020-03-15 LAB — URINALYSIS, ROUTINE W REFLEX MICROSCOPIC
Bilirubin Urine: NEGATIVE
Glucose, UA: NEGATIVE mg/dL
Hgb urine dipstick: NEGATIVE
Ketones, ur: NEGATIVE mg/dL
Nitrite: NEGATIVE
Protein, ur: NEGATIVE mg/dL
Specific Gravity, Urine: 1.015 (ref 1.005–1.030)
pH: 7 (ref 5.0–8.0)

## 2020-03-15 LAB — BASIC METABOLIC PANEL
Anion gap: 9 (ref 5–15)
BUN: 8 mg/dL (ref 6–20)
CO2: 24 mmol/L (ref 22–32)
Calcium: 9.5 mg/dL (ref 8.9–10.3)
Chloride: 106 mmol/L (ref 98–111)
Creatinine, Ser: 0.99 mg/dL (ref 0.44–1.00)
GFR calc Af Amer: >60 mL/min (ref >60)
GFR calc non Af Amer: >60 mL/min (ref >60)
Glucose, Bld: 103 mg/dL — ABNORMAL HIGH (ref 70–99)
Potassium: 4 mmol/L (ref 3.5–5.1)
Sodium: 139 mmol/L (ref 135–145)

## 2020-03-15 LAB — I-STAT BETA HCG BLOOD, ED (MC, WL, AP ONLY): I-stat hCG, quantitative: <5 m[IU]/mL (ref <5)

## 2020-03-15 LAB — TROPONIN I (HIGH SENSITIVITY): Troponin I (High Sensitivity): <2 ng/L (ref <18)

## 2020-03-15 NOTE — ED Triage Notes (Signed)
Pt presents to ED POV. Pt c/o L CP, weakness, dizziness, poor PO intake. Pt reports pain 3/10 and nonradiatin, worsens w/ position. Pt reports that s/s began wed. Pt NAD in triage

## 2020-03-16 LAB — TROPONIN I (HIGH SENSITIVITY): Troponin I (High Sensitivity): <2 ng/L (ref <18)

## 2020-03-16 NOTE — ED Notes (Signed)
Pt LWBS 

## 2020-03-17 ENCOUNTER — Telehealth: Payer: Self-pay | Admitting: Registered Nurse

## 2020-03-17 NOTE — Telephone Encounter (Signed)
FYI patient request

## 2020-03-17 NOTE — Telephone Encounter (Signed)
Pt would like provider to look at her ER visit notes from 03/15/20 before her virtual appt on 03/19/20. Please Advise.

## 2020-03-18 ENCOUNTER — Telehealth (INDEPENDENT_AMBULATORY_CARE_PROVIDER_SITE_OTHER): Payer: 59 | Admitting: Psychiatry

## 2020-03-18 ENCOUNTER — Other Ambulatory Visit: Payer: Self-pay

## 2020-03-18 DIAGNOSIS — F411 Generalized anxiety disorder: Secondary | ICD-10-CM | POA: Diagnosis not present

## 2020-03-18 DIAGNOSIS — F340 Cyclothymic disorder: Secondary | ICD-10-CM

## 2020-03-18 DIAGNOSIS — F603 Borderline personality disorder: Secondary | ICD-10-CM | POA: Diagnosis not present

## 2020-03-18 MED ORDER — QUETIAPINE FUMARATE ER 50 MG PO TB24: 50.0000 mg | ORAL_TABLET | Freq: Every day | ORAL | 0 refills | Status: DC
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
## 2020-03-19 ENCOUNTER — Encounter: Payer: Self-pay | Admitting: Registered Nurse

## 2020-03-19 ENCOUNTER — Other Ambulatory Visit: Payer: Self-pay

## 2020-03-19 ENCOUNTER — Ambulatory Visit (INDEPENDENT_AMBULATORY_CARE_PROVIDER_SITE_OTHER): Payer: 59 | Admitting: Registered Nurse

## 2020-03-19 VITALS — BP 119/82 | HR 60 | Temp 97.8°F | Resp 18 | Ht 65.0 in

## 2020-03-19 DIAGNOSIS — R1114 Bilious vomiting: Secondary | ICD-10-CM | POA: Diagnosis not present

## 2020-03-19 DIAGNOSIS — R531 Weakness: Secondary | ICD-10-CM

## 2020-03-19 DIAGNOSIS — R1084 Generalized abdominal pain: Secondary | ICD-10-CM | POA: Diagnosis not present

## 2020-03-19 LAB — POCT CBC
Granulocyte percent: 75.4 %G (ref 37–80)
HCT, POC: 38.5 % (ref 29–41)
Hemoglobin: 12.7 g/dL (ref 11–14.6)
Lymph, poc: 2.4 (ref 0.6–3.4)
MCH, POC: 26.5 pg — AB (ref 27–31.2)
MCHC: 33 g/dL (ref 31.8–35.4)
MCV: 80.2 fL (ref 76–111)
MID (cbc): 0.3 (ref 0–0.9)
MPV: 7.3 fL (ref 0–99.8)
POC Granulocyte: 8.1 — AB (ref 2–6.9)
POC LYMPH PERCENT: 22.2 %L (ref 10–50)
POC MID %: 2.4 %M (ref 0–12)
Platelet Count, POC: 333 10*3/uL (ref 142–424)
RBC: 4.8 M/uL (ref 4.04–5.48)
RDW, POC: 14.4 %
WBC: 10.7 10*3/uL — AB (ref 4.6–10.2)

## 2020-03-19 LAB — POCT URINE PREGNANCY: Preg Test, Ur: NEGATIVE

## 2020-03-19 LAB — POCT URINALYSIS DIP (CLINITEK)
Blood, UA: NEGATIVE
Glucose, UA: NEGATIVE mg/dL
Nitrite, UA: NEGATIVE
Spec Grav, UA: >=1.03 — AB (ref 1.010–1.025)
Urobilinogen, UA: 0.2 E.U./dL
pH, UA: 7 (ref 5.0–8.0)

## 2020-03-19 MED ORDER — ONDANSETRON HCL 4 MG PO TABS: 4.0000 mg | ORAL_TABLET | Freq: Three times a day (TID) | ORAL | 0 refills | Status: DC | PRN

## 2020-03-19 NOTE — Patient Instructions (Signed)
° ° ° °  If you have lab work done today you will be contacted with your lab results within the next 2 weeks.  If you have not heard from us then please contact us. The fastest way to get your results is to register for My Chart. ° ° °IF you received an x-ray today, you will receive an invoice from [facility]. Please contact [facility] at [phone] with questions or concerns regarding your invoice.  ° °IF you received labwork today, you will receive an invoice from [facility]. Please contact [facility] at [phone] with questions or concerns regarding your invoice.  ° °Our billing staff will not be able to assist you with questions regarding bills from these companies. ° °You will be contacted with the lab results as soon as they are available. The fastest way to get your results is to activate your My Chart account. Instructions are located on the last page of this paperwork. If you have not heard from us regarding the results in 2 weeks, please contact this office. °  ° ° ° °

## 2020-03-19 NOTE — Progress Notes (Signed)
Established Patient Office Visit  Subjective:  Patient ID: Kimberly Harper, female    DOB: Oct 28, 1985  Age: 34 y.o. MRN: [id number]  CC:  Chief Complaint  Patient presents with   Fatigue    PAtient states for the last 1.5 she has been very fatique, have not ate in a week, lost 9 lbs, nausea and a stomach ache.    HPI Kimberly Harper presents for generalized weakness and fatigue  Onset around 10 days ago Nausea, vomiting, diarrhea, fatigue, mild shob, headaches.  Has not been able to eat much or consume much water. Nausea is constant, food is nearly immediately vomited up Has not started any new medications. Does note that she has stopped her buspirone - it was not effective  No chest pain, doe, visual changes, claudication. Denies exposure to any sick contacts  Past Medical History:  Diagnosis Date   Anemia    Phreesia 01/17/2020   Anxiety    Bipolar 1 disorder (HCC)    Blood transfusion without reported diagnosis    Phreesia 01/17/2020   Depression    Phreesia 01/17/2020   Major depressive disorder    Substance abuse (HCC)    Phreesia 01/17/2020    Past Surgical History:  Procedure Laterality Date   CESAREAN SECTION     CESAREAN SECTION N/A    Phreesia 01/17/2020   DILATION AND CURETTAGE OF UTERUS      Family History  Problem Relation Age of Onset   Hypertension Mother    Diabetes Mother    Stroke Mother    Depression Mother    Depression Sister    Depression Brother    Alcohol abuse Maternal Grandfather    Alcohol abuse Paternal Grandfather     Social History   Socioeconomic History   Marital status: Single    Spouse name: Not on file   Number of children: 1   Years of education: Not on file   Highest education level: Not on file  Occupational History   Not on file  Tobacco Use   Smoking status: Never Smoker   Smokeless tobacco: Never Used  Substance and Sexual Activity   Alcohol use: Yes    Comment:  socially   Drug use: Never   Sexual activity: Yes    Birth control/protection: None  Other Topics Concern   Not on file  Social History Narrative   Not on file   Social Determinants of Health   Financial Resource Strain:    Difficulty of Paying Living Expenses: Not on file  Food Insecurity:    Worried About Programme researcher, broadcasting/film/video in the Last Year: Not on file   [employer] of Food in the Last Year: Not on file  Transportation Needs:    Lack of Transportation (Medical): Not on file   Lack of Transportation (Non-Medical): Not on file  Physical Activity:    Days of Exercise per Week: Not on file   Minutes of Exercise per Session: Not on file  Stress:    Feeling of Stress : Not on file  Social Connections:    Frequency of Communication with Friends and Family: Not on file   Frequency of Social Gatherings with Friends and Family: Not on file   Attends Religious Services: Not on file   Active Member of Clubs or Organizations: Not on file   Attends Banker Meetings: Not on file   Marital Status: Not on file  Intimate Partner Violence:    Fear of  Current or Ex-Partner: Not on file   Emotionally Abused: Not on file   Physically Abused: Not on file   Sexually Abused: Not on file    Outpatient Medications Prior to Visit  Medication Sig Dispense Refill   QUEtiapine (SEROQUEL XR) 50 MG TB24 24 hr tablet Take 1 tablet (50 mg total) by mouth at bedtime. 30 tablet 0   traZODone (DESYREL) 100 MG tablet Take 1 tablet (100 mg total) by mouth at bedtime as needed for sleep. 30 tablet 1   cetirizine (ZYRTEC) 10 MG tablet Take 1 tablet (10 mg total) by mouth daily. 30 tablet 11   fluticasone (FLONASE) 50 MCG/ACT nasal spray Place 2 sprays into both nostrils daily. 16 g 6   gabapentin (NEURONTIN) 600 MG tablet Take 2 tablets (1,200 mg total) by mouth 2 (two) times daily. 120 tablet 1   No facility-administered medications prior to visit.    Allergies   Allergen Reactions   Multiple Vitamins-Iron Swelling    States that vitamins and iron have caused swelling of tongue and gums    ROS Review of Systems Pertinent negatives and positives per hpi     Objective:    Physical Exam Vitals and nursing note reviewed.  Constitutional:      General: She is not in acute distress.    Appearance: Normal appearance. She is normal weight. She is not ill-appearing, toxic-appearing or diaphoretic.  HENT:     Head: Normocephalic and atraumatic.     Right Ear: Tympanic membrane normal.     Left Ear: Tympanic membrane normal.     Mouth/Throat:     Mouth: Mucous membranes are moist.     Pharynx: Oropharynx is clear. No oropharyngeal exudate or posterior oropharyngeal erythema.  Eyes:     Extraocular Movements: Extraocular movements intact.     Pupils: Pupils are equal, round, and reactive to light.  Cardiovascular:     Rate and Rhythm: Normal rate and regular rhythm.     Heart sounds: Normal heart sounds. No murmur heard.  No friction rub. No gallop.   Pulmonary:     Effort: Pulmonary effort is normal. No respiratory distress.     Breath sounds: Normal breath sounds. No stridor. No wheezing, rhonchi or rales.  Chest:     Chest wall: No tenderness.  Abdominal:     General: Abdomen is flat. Bowel sounds are normal. There is no distension.     Palpations: Abdomen is soft. There is no mass.     Tenderness: There is abdominal tenderness (diffuse). There is no right CVA tenderness, left CVA tenderness, guarding or rebound.     Hernia: No hernia is present.  Musculoskeletal:     Right lower leg: No edema.     Left lower leg: No edema.  Skin:    General: Skin is warm and dry.     Capillary Refill: Capillary refill takes less than 2 seconds.     Coloration: Skin is not jaundiced or pale.     Findings: No bruising, erythema, lesion or rash.  Neurological:     General: No focal deficit present.     Mental Status: She is alert and oriented to  person, place, and time. Mental status is at baseline.  Psychiatric:        Mood and Affect: Mood normal.        Behavior: Behavior normal.        Thought Content: Thought content normal.        Judgment:  Judgment normal.     BP 119/82    Pulse 60    Temp 97.8 F (36.6 C) (Temporal)    Resp 18    Ht 5\' 5"  (1.651 m)    SpO2 99%    BMI 26.46 kg/m  Wt Readings from Last 3 Encounters:  No data found for Wt     There are no preventive care reminders to display for this patient.  There are no preventive care reminders to display for this patient.  Lab Results  Component Value Date   TSH 1.020 01/19/2020   Lab Results  Component Value Date   WBC 10.7 (A) 03/19/2020   HGB 12.7 03/19/2020   HCT 38.5 03/19/2020   MCV 80.2 03/19/2020   PLT 316 03/15/2020   Lab Results  Component Value Date   NA 139 03/15/2020   K 4.0 03/15/2020   CO2 24 03/15/2020   GLUCOSE 103 (H) 03/15/2020   BUN 8 03/15/2020   CREATININE 0.99 03/15/2020   BILITOT 0.4 01/19/2020   ALKPHOS 64 01/19/2020   AST 16 01/19/2020   ALT 13 01/19/2020   PROT 7.2 01/19/2020   ALBUMIN 4.3 01/19/2020   CALCIUM 9.5 03/15/2020   ANIONGAP 9 03/15/2020   Lab Results  Component Value Date   CHOL 189 01/19/2020   Lab Results  Component Value Date   HDL 47 01/19/2020   Lab Results  Component Value Date   LDLCALC 122 (H) 01/19/2020   Lab Results  Component Value Date   TRIG 109 01/19/2020   Lab Results  Component Value Date   CHOLHDL 4.0 01/19/2020   Lab Results  Component Value Date   HGBA1C 5.3 01/19/2020      Assessment & Plan:   Problem List Items Addressed This Visit    None    Visit Diagnoses    Weakness    -  Primary   Relevant Orders   Novel Coronavirus, NAA ([facility])   POCT CBC (Completed)   POCT URINALYSIS DIP (CLINITEK) (Completed)   POCT urine pregnancy (Completed)   Amylase   Lipase   Comprehensive metabolic panel   CK   Bilious vomiting with nausea       Relevant  Medications   ondansetron (ZOFRAN) 4 MG tablet   Other Relevant Orders   Amylase   Lipase   Comprehensive metabolic panel   Generalized abdominal pain       Relevant Medications   ondansetron (ZOFRAN) 4 MG tablet   Other Relevant Orders   Amylase   Lipase   Comprehensive metabolic panel      Meds ordered this encounter  Medications   ondansetron (ZOFRAN) 4 MG tablet    Sig: Take 1 tablet (4 mg total) by mouth every 8 (eight) hours as needed for nausea or vomiting.    Dispense:  20 tablet    Refill:  0    Order Specific Question:   Supervising Provider    Answer:   01/21/2020, JEFFREY R [[id number]]    Follow-up: No follow-ups on file.   PLAN  Unclear etiology  Will give zofran for nausea, encourage BRAT diet and push a lot of clear fluids.  Will refer to GI for further workup  ER precautions reviewed with patient who demonstrates understanding  Patient encouraged to call clinic with any questions, comments, or concerns.  Neva Seat, NP

## 2020-03-20 LAB — COMPREHENSIVE METABOLIC PANEL
ALT: 9 IU/L (ref 0–32)
AST: 13 IU/L (ref 0–40)
Albumin/Globulin Ratio: 1.7 (ref 1.2–2.2)
Albumin: 4.7 g/dL (ref 3.8–4.8)
Alkaline Phosphatase: 60 IU/L (ref 48–121)
BUN/Creatinine Ratio: 7 — ABNORMAL LOW (ref 9–23)
BUN: 6 mg/dL (ref 6–20)
Bilirubin Total: 0.6 mg/dL (ref 0.0–1.2)
CO2: 21 mmol/L (ref 20–29)
Calcium: 9.9 mg/dL (ref 8.7–10.2)
Chloride: 101 mmol/L (ref 96–106)
Creatinine, Ser: 0.83 mg/dL (ref 0.57–1.00)
GFR calc Af Amer: 106 mL/min/{1.73_m2} (ref >59)
GFR calc non Af Amer: 92 mL/min/{1.73_m2} (ref >59)
Globulin, Total: 2.8 g/dL (ref 1.5–4.5)
Glucose: 91 mg/dL (ref 65–99)
Potassium: 4 mmol/L (ref 3.5–5.2)
Sodium: 136 mmol/L (ref 134–144)
Total Protein: 7.5 g/dL (ref 6.0–8.5)

## 2020-03-20 LAB — SARS-COV-2, NAA 2 DAY TAT

## 2020-03-20 LAB — CK: Total CK: 51 U/L (ref 32–182)

## 2020-03-20 LAB — NOVEL CORONAVIRUS, NAA: SARS-CoV-2, NAA: NOT DETECTED

## 2020-03-20 LAB — LIPASE: Lipase: 42 U/L (ref 14–72)

## 2020-03-20 LAB — AMYLASE: Amylase: 87 U/L (ref 31–110)

## 2020-03-22 ENCOUNTER — Ambulatory Visit: Payer: Self-pay | Admitting: Nurse Practitioner

## 2020-03-22 ENCOUNTER — Other Ambulatory Visit: Payer: Self-pay | Admitting: Registered Nurse

## 2020-03-22 ENCOUNTER — Encounter: Payer: Self-pay | Admitting: Registered Nurse

## 2020-03-22 ENCOUNTER — Other Ambulatory Visit (HOSPITAL_COMMUNITY): Payer: Self-pay | Admitting: Psychiatry

## 2020-03-22 ENCOUNTER — Telehealth (HOSPITAL_COMMUNITY): Payer: Self-pay | Admitting: *Deleted

## 2020-03-22 DIAGNOSIS — R1013 Epigastric pain: Secondary | ICD-10-CM

## 2020-03-22 DIAGNOSIS — F419 Anxiety disorder, unspecified: Secondary | ICD-10-CM

## 2020-03-22 MED ORDER — OMEPRAZOLE 40 MG PO CPDR: 40.0000 mg | DELAYED_RELEASE_CAPSULE | Freq: Every day | ORAL | 3 refills | Status: DC

## 2020-03-22 MED ORDER — QUETIAPINE FUMARATE 50 MG PO TABS: 50.0000 mg | ORAL_TABLET | Freq: Every day | ORAL | 0 refills | Status: DC

## 2020-03-22 MED ORDER — ALPRAZOLAM 0.25 MG PO TBDP: 0.2500 mg | ORAL_TABLET | Freq: Every evening | ORAL | 0 refills | Status: DC | PRN

## 2020-03-22 NOTE — Telephone Encounter (Signed)
I changed Rx to IR Seroquel.

## 2020-03-22 NOTE — Telephone Encounter (Signed)
Received t/c from pt requesting wanting to start Seroquel IR so that she doesn't have to wait for the PA on the recently written Seroquel ER. Please review and advise. Pt has tried the IR before.

## 2020-03-22 NOTE — Telephone Encounter (Signed)
Pt wants to know if they should be concerned about the ketones in her urine

## 2020-03-26 ENCOUNTER — Other Ambulatory Visit: Payer: Self-pay | Admitting: Registered Nurse

## 2020-03-26 DIAGNOSIS — R1084 Generalized abdominal pain: Secondary | ICD-10-CM

## 2020-03-26 DIAGNOSIS — R1114 Bilious vomiting: Secondary | ICD-10-CM

## 2020-03-26 MED ORDER — ONDANSETRON HCL 4 MG PO TABS: 4.0000 mg | ORAL_TABLET | Freq: Three times a day (TID) | ORAL | 0 refills | Status: DC | PRN

## 2020-03-31 NOTE — Telephone Encounter (Signed)
Patient is calling about the referral for gastro but the referral team needs the office visit signed for 03/19/2020

## 2020-04-02 ENCOUNTER — Encounter: Payer: Self-pay | Admitting: Registered Nurse

## 2020-04-12 ENCOUNTER — Telehealth (INDEPENDENT_AMBULATORY_CARE_PROVIDER_SITE_OTHER): Payer: 59 | Admitting: Psychiatry

## 2020-04-12 ENCOUNTER — Other Ambulatory Visit: Payer: Self-pay

## 2020-04-12 DIAGNOSIS — F603 Borderline personality disorder: Secondary | ICD-10-CM | POA: Diagnosis not present

## 2020-04-12 DIAGNOSIS — F411 Generalized anxiety disorder: Secondary | ICD-10-CM | POA: Diagnosis not present

## 2020-04-12 DIAGNOSIS — F3181 Bipolar II disorder: Secondary | ICD-10-CM

## 2020-04-12 DIAGNOSIS — F410 Panic disorder [episodic paroxysmal anxiety] without agoraphobia: Secondary | ICD-10-CM | POA: Diagnosis not present

## 2020-04-12 MED ORDER — ALPRAZOLAM 0.5 MG PO TABS: 0.5000 mg | ORAL_TABLET | Freq: Every day | ORAL | 0 refills | Status: DC | PRN

## 2020-04-12 MED ORDER — QUETIAPINE FUMARATE 200 MG PO TABS: 200.0000 mg | ORAL_TABLET | Freq: Every day | ORAL | 0 refills | Status: DC
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
## 2020-04-19 ENCOUNTER — Ambulatory Visit: Payer: Self-pay | Admitting: Nurse Practitioner

## 2020-04-19 ENCOUNTER — Other Ambulatory Visit (HOSPITAL_COMMUNITY): Payer: Self-pay | Admitting: Psychiatry

## 2020-04-19 DIAGNOSIS — Z0289 Encounter for other administrative examinations: Secondary | ICD-10-CM

## 2020-05-05 ENCOUNTER — Other Ambulatory Visit: Payer: Self-pay

## 2020-05-05 ENCOUNTER — Telehealth (INDEPENDENT_AMBULATORY_CARE_PROVIDER_SITE_OTHER): Payer: 59 | Admitting: Psychiatry

## 2020-05-05 DIAGNOSIS — F410 Panic disorder [episodic paroxysmal anxiety] without agoraphobia: Secondary | ICD-10-CM | POA: Diagnosis not present

## 2020-05-05 DIAGNOSIS — F411 Generalized anxiety disorder: Secondary | ICD-10-CM | POA: Diagnosis not present

## 2020-05-05 DIAGNOSIS — F603 Borderline personality disorder: Secondary | ICD-10-CM | POA: Diagnosis not present

## 2020-05-05 DIAGNOSIS — F3181 Bipolar II disorder: Secondary | ICD-10-CM | POA: Diagnosis not present

## 2020-05-05 MED ORDER — ALPRAZOLAM 0.5 MG PO TABS: 0.5000 mg | ORAL_TABLET | Freq: Three times a day (TID) | ORAL | 1 refills | Status: AC | PRN

## 2020-05-05 MED ORDER — TRAZODONE HCL 100 MG PO TABS: 100.0000 mg | ORAL_TABLET | Freq: Every evening | ORAL | 1 refills | Status: DC | PRN

## 2020-05-05 MED ORDER — QUETIAPINE FUMARATE ER 200 MG PO TB24: 200.0000 mg | ORAL_TABLET | Freq: Every day | ORAL | 1 refills | Status: DC

## 2020-05-05 MED ORDER — GABAPENTIN 600 MG PO TABS
1200.0000 mg | ORAL_TABLET | Freq: Two times a day (BID) | ORAL | 2 refills | Status: DC
Start: 2020-05-05 — End: 2020-07-27

## 2020-05-05 MED ORDER — BUPROPION HCL ER (XL) 150 MG PO TB24: ORAL_TABLET | ORAL | 0 refills | Status: DC
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
## 2020-05-09 ENCOUNTER — Other Ambulatory Visit (HOSPITAL_COMMUNITY): Payer: Self-pay | Admitting: Psychiatry

## 2020-05-09 ENCOUNTER — Other Ambulatory Visit: Payer: Self-pay | Admitting: Registered Nurse

## 2020-05-09 DIAGNOSIS — R1114 Bilious vomiting: Secondary | ICD-10-CM

## 2020-05-09 DIAGNOSIS — R1084 Generalized abdominal pain: Secondary | ICD-10-CM

## 2020-05-11 MED ORDER — ONDANSETRON HCL 4 MG PO TABS: 4.0000 mg | ORAL_TABLET | Freq: Three times a day (TID) | ORAL | 0 refills | Status: DC | PRN

## 2020-05-13 ENCOUNTER — Other Ambulatory Visit (HOSPITAL_COMMUNITY): Payer: Self-pay | Admitting: Psychiatry

## 2020-05-13 ENCOUNTER — Telehealth (HOSPITAL_COMMUNITY): Payer: Self-pay

## 2020-05-13 MED ORDER — BUPROPION HCL ER (XL) 150 MG PO TB24: 150.0000 mg | ORAL_TABLET | ORAL | 0 refills | Status: DC

## 2020-05-13 MED ORDER — BUPROPION HCL ER (XL) 300 MG PO TB24: 300.0000 mg | ORAL_TABLET | Freq: Every day | ORAL | 0 refills | Status: DC

## 2020-05-13 NOTE — Telephone Encounter (Signed)
Done

## 2020-05-13 NOTE — Telephone Encounter (Signed)
Received a fax to do a PA on patient's Bupropion 150mg  24 hr tab. I spoke with the pharmacy to see if we could her medication covered if the script was written differently. They said we could get it covered without a PA if 1 script was sent in for he initial 7 days: 1 tablet (150mg  total) po qam and then another script for the rest of it. The pharmacist stated that they would be more likely to cover it if it was sent in for 1 300mg  tab: 1 tablet (300mg  total) po qam. Please review and advise. Thank you.

## 2020-05-17 NOTE — Telephone Encounter (Signed)
Relayed message - LVM 

## 2020-05-19 NOTE — Telephone Encounter (Signed)
error 

## 2020-05-26 ENCOUNTER — Other Ambulatory Visit (HOSPITAL_COMMUNITY): Payer: Self-pay | Admitting: Psychiatry

## 2020-06-03 ENCOUNTER — Telehealth (HOSPITAL_COMMUNITY): Payer: 59 | Admitting: Psychiatry

## 2020-06-03 ENCOUNTER — Other Ambulatory Visit: Payer: Self-pay

## 2020-06-18 ENCOUNTER — Other Ambulatory Visit (HOSPITAL_COMMUNITY): Payer: Self-pay | Admitting: Psychiatry

## 2020-07-05 ENCOUNTER — Telehealth (INDEPENDENT_AMBULATORY_CARE_PROVIDER_SITE_OTHER): Payer: 59 | Admitting: Psychiatry

## 2020-07-05 ENCOUNTER — Other Ambulatory Visit: Payer: Self-pay

## 2020-07-05 DIAGNOSIS — F410 Panic disorder [episodic paroxysmal anxiety] without agoraphobia: Secondary | ICD-10-CM | POA: Diagnosis not present

## 2020-07-05 DIAGNOSIS — F603 Borderline personality disorder: Secondary | ICD-10-CM

## 2020-07-05 DIAGNOSIS — F411 Generalized anxiety disorder: Secondary | ICD-10-CM | POA: Diagnosis not present

## 2020-07-05 DIAGNOSIS — F3181 Bipolar II disorder: Secondary | ICD-10-CM | POA: Diagnosis not present

## 2020-07-05 MED ORDER — ALPRAZOLAM 0.5 MG PO TABS: 0.5000 mg | ORAL_TABLET | Freq: Three times a day (TID) | ORAL | 2 refills | Status: DC | PRN

## 2020-07-05 MED ORDER — QUETIAPINE FUMARATE ER 300 MG PO TB24
300.0000 mg | ORAL_TABLET | Freq: Every day | ORAL | 1 refills | Status: DC
Start: 2020-07-05 — End: 2020-09-20

## 2020-07-05 MED ORDER — BUPROPION HCL ER (SR) 200 MG PO TB12: 200.0000 mg | ORAL_TABLET | Freq: Two times a day (BID) | ORAL | 2 refills | Status: DC

## 2020-07-05 MED ORDER — TRAZODONE HCL 100 MG PO TABS
100.0000 mg | ORAL_TABLET | Freq: Every evening | ORAL | 1 refills | Status: DC | PRN
Start: 2020-07-05 — End: 2020-09-15
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
## 2020-07-23 ENCOUNTER — Other Ambulatory Visit: Payer: Self-pay | Admitting: Registered Nurse

## 2020-07-23 DIAGNOSIS — R1084 Generalized abdominal pain: Secondary | ICD-10-CM

## 2020-07-23 DIAGNOSIS — R1114 Bilious vomiting: Secondary | ICD-10-CM

## 2020-07-25 ENCOUNTER — Other Ambulatory Visit: Payer: Self-pay | Admitting: Registered Nurse

## 2020-07-25 DIAGNOSIS — R1084 Generalized abdominal pain: Secondary | ICD-10-CM

## 2020-07-25 DIAGNOSIS — R1114 Bilious vomiting: Secondary | ICD-10-CM

## 2020-07-26 MED ORDER — ONDANSETRON HCL 4 MG PO TABS: 4.0000 mg | ORAL_TABLET | Freq: Three times a day (TID) | ORAL | 0 refills | Status: DC | PRN

## 2020-07-27 ENCOUNTER — Other Ambulatory Visit: Payer: Self-pay

## 2020-07-27 ENCOUNTER — Telehealth (HOSPITAL_COMMUNITY): Payer: Self-pay | Admitting: Psychiatry

## 2020-07-27 ENCOUNTER — Other Ambulatory Visit (HOSPITAL_COMMUNITY): Payer: Self-pay | Admitting: Psychiatry

## 2020-07-27 MED ORDER — GABAPENTIN 600 MG PO TABS: 1200.0000 mg | ORAL_TABLET | Freq: Two times a day (BID) | ORAL | 2 refills | Status: DC

## 2020-09-07 ENCOUNTER — Other Ambulatory Visit: Payer: Self-pay

## 2020-09-07 ENCOUNTER — Encounter: Payer: Self-pay | Admitting: Gastroenterology

## 2020-09-07 ENCOUNTER — Telehealth: Payer: Self-pay

## 2020-09-07 ENCOUNTER — Ambulatory Visit (INDEPENDENT_AMBULATORY_CARE_PROVIDER_SITE_OTHER): Payer: 59 | Admitting: Gastroenterology

## 2020-09-07 VITALS — BP 100/70 | HR 80 | Ht 64.0 in | Wt 143.5 lb

## 2020-09-07 DIAGNOSIS — R109 Unspecified abdominal pain: Secondary | ICD-10-CM | POA: Diagnosis not present

## 2020-09-07 DIAGNOSIS — F5000 Anorexia nervosa, unspecified: Secondary | ICD-10-CM | POA: Diagnosis not present

## 2020-09-07 DIAGNOSIS — R1114 Bilious vomiting: Secondary | ICD-10-CM | POA: Diagnosis not present

## 2020-09-07 MED ORDER — DICYCLOMINE HCL 10 MG PO CAPS: 10.0000 mg | ORAL_CAPSULE | Freq: Four times a day (QID) | ORAL | 0 refills | Status: DC | PRN

## 2020-09-07 NOTE — Progress Notes (Signed)
Following sent to Lesly Rubenstein and April Pait:  RADIOLOGY Wilkie Aye  [facility] Phone: [phone] Fax: [phone]   Patient Name: Kimberly Harper DOB: April 28, 1986 MRN #: [id number]  Imaging Ordered: Abd [place]  Diagnosis: Abdominal pain, nausea, anorexia  Ordering Provider: Dr. Orvan Falconer  Is a Prior Authorization needed? We are in the process of obtaining it now  Is the patient Diabetic? No  Does the patient have Hypertension? No  Does the patient have any implanted devices or hardware? No  Date of last BUN/Creat, if needed? N/A  Patient Weight? 143#  Is the patient able to get on the table? Yes  Has the patient been diagnosed with COVID? No  Is the patient waiting on COVID testing results? No  Thank you for your assistance! [facility]

## 2020-09-07 NOTE — Progress Notes (Signed)
Referring Provider: Janeece Agee, NP Primary Care Physician:  Janeece Agee, NP  Reason for Consultation:  Abdominal pain, nausea, anorexia   IMPRESSION:  Abdominal pain, nausea, anorexia  GI symptoms may be manifestation of her anxiety or potentially worsened by medications.  I am concerned that extensive testing may ultimately worsen her anxiety. However, organic GI disease must be excluded.   PLAN: - Obtain records from [facility] 2018 in [place], [place] - Abdominal ultrasound to evaluate her hepatobiliary tree - EGD with esophageal, gastric, and duodenal biopsies to evaluate for esophagitis, gastritis, H pylori, gastric outlet obstruction, PUD, and celiac - Follow-up with psychiatrist recommended as control of her anxiety may provide the best relief of GI symptoms - I agreed to contact her psychiatrist to discuss these concerns  Please see the "Patient Instructions" section for addition details about the plan.  HPI: Kimberly Harper is a 35 y.o. female referred by NP Kateri Plummer for further evaluation of abdominal pain.  The history is obtained through the patient, review of her electronic health record, and records provided by Dr. Kateri Plummer.  The original consultation was placed in August when the patient had a 10-day history of nausea, vomiting, and diarrhea.  She has just recently been able to schedule the appointment.  She has bipolar 1 and 2 disorder, major depressive disorder, generalized anxiety disorder, borderline personality disorder, panic disorder, history of suicidal ideation and attempted overdose overdose.  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 She has been on multiple medications over the years.  She had GI side effects on high-dose BuSpar and has had intermittent symptoms related to many of them.  GI symptoms initially developed in July and August during a time of poorly controlled anxiety that manifests  as anorexia, nausea, post-prandial diarrhea, and some vomiting. Experienced abdominal cramping with any food intake.  Has been able to tolerated eating since starting Zofran.   Labs 03/19/20 normal lipase, amylase, CMP, CK  She reports multiple psychosocial stressors and poorly treated depression. She notes she should call her psychiatrist but she just doesn't want to. She wonders if her symptoms are related to an overdose in May when she took pills and drank a lot of alcohol.   Symptoms previously evaluated in [place] including a colonoscopy, abdominal imaging and some stimulation tests in 2018. She doesn't remember getting a response or any conclusive results.   No known family history of colon cancer or polyps. No family history of uterine/endometrial cancer, pancreatic cancer or gastric/stomach cancer.  Does not feel that she can "survive" another colonoscopy.   She specifically asked about having a parasite because she is also hungry immediately after eating.    Past Medical History:  Diagnosis Date  . Anemia    Phreesia 01/17/2020  . Anxiety   . Bipolar 1 disorder (HCC)   . Blood transfusion without reported diagnosis    Phreesia 01/17/2020  . Depression    Phreesia 01/17/2020  . Major depressive disorder   . Substance abuse (HCC)    Phreesia 01/17/2020    Past Surgical History:  Procedure Laterality Date  . CESAREAN SECTION    . CESAREAN SECTION N/A    Phreesia 01/17/2020  . DILATION AND CURETTAGE OF UTERUS      Current Outpatient Medications  Medication Sig Dispense Refill  . ALPRAZolam (XANAX) 0.5 MG tablet Take 1 tablet (0.5 mg total) by mouth 3 (three) times daily as needed for sleep or  anxiety. 90 tablet 2  . buPROPion (WELLBUTRIN SR) 200 MG 12 hr tablet Take 1 tablet (200 mg total) by mouth 2 (two) times daily. 60 tablet 2  . gabapentin (NEURONTIN) 600 MG tablet Take 2 tablets (1,200 mg total) by mouth 2 (two) times daily. 120 tablet 2  . omeprazole  (PRILOSEC) 40 MG capsule Take 1 capsule (40 mg total) by mouth daily. 30 capsule 3  . ondansetron (ZOFRAN) 4 MG tablet Take 1 tablet (4 mg total) by mouth every 8 (eight) hours as needed for nausea or vomiting. 20 tablet 0  . QUEtiapine (SEROQUEL XR) 300 MG 24 hr tablet Take 1 tablet (300 mg total) by mouth at bedtime. 30 tablet 1  . traZODone (DESYREL) 100 MG tablet Take 1 tablet (100 mg total) by mouth at bedtime as needed for sleep. 30 tablet 1   No current facility-administered medications for this visit.    Allergies as of 09/07/2020 - Review Complete 07/05/2020  Allergen Reaction Noted  . Multiple vitamins-iron Swelling 12/07/2019    Family History  Problem Relation Age of Onset  . Hypertension Mother   . Diabetes Mother   . Stroke Mother   . Depression Mother   . Depression Sister   . Depression Brother   . Alcohol abuse Maternal Grandfather   . Alcohol abuse Paternal Grandfather      Review of Systems: 12 system ROS is negative except as noted above with the addition of allergies, anxiety, back pain, confusion, depression, fatigue, headaches, menstrual pain, night sweats, shortness of breath, rash, insomnia.   Physical Exam: General:   Alert,  well-nourished, pleasant and cooperative in NAD Head:  Normocephalic and atraumatic. Eyes:  Sclera clear, no icterus.   Conjunctiva pink. Ears:  Normal auditory acuity. Nose:  No deformity, discharge,  or lesions. Mouth:  No deformity or lesions.   Neck:  Supple; no masses or thyromegaly. Lungs:  Clear throughout to auscultation.   No wheezes. Heart:  Regular rate and rhythm; no murmurs. Abdomen:  Soft, nontender, nondistended, normal bowel sounds, no rebound or guarding. No hepatosplenomegaly.   Rectal:  Deferred  Msk:  Symmetrical. No boney deformities LAD: No inguinal or umbilical LAD Extremities:  No clubbing or edema. Neurologic:  Alert and  oriented x4;  grossly nonfocal Skin:  Intact without significant lesions or  rashes. Psych:  Alert and cooperative. Normal mood and affect.     Taylyn Brame L. Orvan Falconer, MD, MPH 09/07/2020, 8:22 AM

## 2020-09-07 NOTE — Patient Instructions (Addendum)
RECOMMENDATION(S):   I am recommending and ENDOSCOPY to better evaluate your symptoms.  ENDOSCOPY:   . You have been scheduled for an endoscopy. Please follow written instructions given to you at your visit today.  INHALERS:   . If you use inhalers (even only as needed), please bring them with you on the day of your procedure.  PRESCRIPTION MEDICATION(S):   We have sent the following medication(s) to your pharmacy:  . Dicyclomine - Please take 10mg  by mouth 4 times daily AS NEEDED for abdominal pain or spasms . With regards to your request to refill Zofran, please reach out to Dr. for your refills.  NOTE: If your medication(s) requires a PRIOR AUTHORIZATION, we will receive notification from your pharmacy. Once received, the process to submit for approval may take up to 7-10 business days. You will be contacted about any denials we have received from your insurance company as well as alternatives recommended by your provider.  IMAGING:  . You will be contacted by [facility] Scheduling (Your caller ID will indicate phone # [phone]) in the next 2 days to schedule your ABDOMINAL ULTRASOUND. If you have not heard from them within 2 business days, please call [facility] Scheduling at [phone] to follow up on the status of your appointment.    RECORDS:   We will obtain records from [facility], [facility] in [place], [place]  FOLLOW UP:  I will plan to contact your psychiatrist to further discuss concerns  BMI:  If you are age 27 or younger, your body mass index should be between 19-25. Your Body mass index is 24.63 kg/m. If this is out of the aformentioned range listed, please consider follow up with your Primary Care Provider.   Thank you for trusting me with your gastrointestinal care!    08-26-1969, MD, MPH

## 2020-09-07 NOTE — Telephone Encounter (Signed)
Records Request  Provider and/or Facility: [name], [place]  Document: Signed ROI  ROI has been faxed successfully to [name], [place]. Document(s) and fax confirmation(s) have been placed in the faxed file for future reference.

## 2020-09-08 ENCOUNTER — Telehealth: Payer: Self-pay

## 2020-09-08 NOTE — Telephone Encounter (Signed)
-----   Message from Kimberly Danas, MD sent at 09/08/2020  8:08 AM EST ----- Would you please call the patient and ask her to call Dr. Demetrius Charity to schedule an appointment? Thank you.  ----- Message ----- From: Magdalene Patricia, MD Sent: 09/08/2020   7:53 AM EST To: Kimberly Danas, MD  Thank you for update on [name]. She has been receiving treatment for anxiety and admitted to me that it has been a significant issue. I have not seen her since December - she was a no show for appointment in January (insurance issues) and has not rescheduled. I am leaving [facility] (retiring) next month so unless she calls for an appointment within next few weeks she will be transferred to a new psychiatrist for further care.  Olgierd Pucilowski ----- Message ----- From: Kimberly Danas, MD Sent: 09/07/2020   9:25 PM EST To: Magdalene Patricia, MD  Dr. Windy Carina,  I saw Kimberly Harper in consultation for multiple Harper symptoms that she feels are due to poorly controlled anxiety. She expressed concerns about calling you to update you with her ongoing anxiety. I asked if she would mind if I reached out to you, as I controlling her anxiety is likely to provide the post symptomatic relief.   I look forward to working with you in her care.  Kimberly Harper

## 2020-09-08 NOTE — Telephone Encounter (Signed)
At Dr. Orvan Falconer request, called pt to advise of conversation with Dr. Hinton Dyer as documented below. Pt has been advised to call to schedule appt ASAP to reduce risk of transfer of care to a new provider. Verbalized acceptance and understanding.

## 2020-09-13 NOTE — Progress Notes (Signed)
Appointment Information  Name: Fauna, Neuner MRN: [id number]  Date: 09/16/2020 Status: Sch  Time: 8:00 AM Length: 60  Visit Type: US ABDOMEN COMPLETE [facility] [[id number]] Copay: $0.00  Provider: [facility] Department: [facility]  Referring Provider: Tressia Danas CSN: [id number]  Notes:   Made On: 09/10/2020 6:12 PM By: Okey Dupre

## 2020-09-14 ENCOUNTER — Other Ambulatory Visit: Payer: Self-pay | Admitting: Registered Nurse

## 2020-09-14 DIAGNOSIS — R1114 Bilious vomiting: Secondary | ICD-10-CM

## 2020-09-14 DIAGNOSIS — R1084 Generalized abdominal pain: Secondary | ICD-10-CM

## 2020-09-14 NOTE — Telephone Encounter (Signed)
Requested medications are due for refill today.  yes  Requested medications are on the active medications list.  yes  Last refill. 07/26/2020  Future visit scheduled.   no  Notes to clinic.  Medication not delegated.

## 2020-09-15 ENCOUNTER — Other Ambulatory Visit (HOSPITAL_COMMUNITY): Payer: Self-pay | Admitting: Psychiatry

## 2020-09-15 MED ORDER — ONDANSETRON HCL 4 MG PO TABS: 4.0000 mg | ORAL_TABLET | Freq: Three times a day (TID) | ORAL | 0 refills | Status: DC | PRN

## 2020-09-16 ENCOUNTER — Other Ambulatory Visit: Payer: Self-pay

## 2020-09-16 ENCOUNTER — Ambulatory Visit (HOSPITAL_COMMUNITY)
Admission: RE | Admit: 2020-09-16 | Discharge: 2020-09-16 | Disposition: A | Discharge status: Home / Self Care | Payer: 59 | Source: Ambulatory Visit | Attending: Gastroenterology | Admitting: Gastroenterology

## 2020-09-16 DIAGNOSIS — R1114 Bilious vomiting: Secondary | ICD-10-CM | POA: Diagnosis present

## 2020-09-16 DIAGNOSIS — R109 Unspecified abdominal pain: Secondary | ICD-10-CM

## 2020-09-16 DIAGNOSIS — F5000 Anorexia nervosa, unspecified: Secondary | ICD-10-CM | POA: Diagnosis present

## 2020-09-17 NOTE — Telephone Encounter (Signed)
Records Request - SECOND REQUEST  Provider and/or Facility: [facility], [place]  Document: Signed ROI  ROI has been faxed successfully to [facility], [place]. Document(s) and fax confirmation(s) have been placed in the faxed file for future reference.

## 2020-09-20 ENCOUNTER — Other Ambulatory Visit (HOSPITAL_COMMUNITY): Payer: Self-pay | Admitting: Psychiatry

## 2020-10-02 ENCOUNTER — Other Ambulatory Visit: Payer: Self-pay

## 2020-10-02 ENCOUNTER — Encounter: Payer: Self-pay | Admitting: Emergency Medicine

## 2020-10-02 ENCOUNTER — Ambulatory Visit
Admission: EM | Admit: 2020-10-02 | Discharge: 2020-10-02 | Disposition: A | Discharge status: Home / Self Care | Payer: 59 | Attending: Family Medicine | Admitting: Family Medicine

## 2020-10-02 DIAGNOSIS — N898 Other specified noninflammatory disorders of vagina: Secondary | ICD-10-CM | POA: Diagnosis present

## 2020-10-02 NOTE — ED Triage Notes (Signed)
Onset of symptoms Monday.  Patient complains of light colored, slight red, brown color with wiping, noticed odor.

## 2020-10-02 NOTE — Discharge Instructions (Addendum)
We have sent testing for vaginal infections/sexually transmitted infections. We will notify you of any positive results once they are received. If required, we will prescribe any medications you might need.  Please refrain from all sexual activity for at least the next seven days.

## 2020-10-02 NOTE — ED Provider Notes (Signed)
[facility]   [id number] 10/02/20 Arrival Time: 0830  ASSESSMENT & PLAN:  1. Vaginal discharge    Declines empiric gonorrhea/chlamydia tx.   Discharge Instructions     We have sent testing for vaginal infections/sexually transmitted infections. We will notify you of any positive results once they are received. If required, we will prescribe any medications you might need.  Please refrain from all sexual activity for at least the next seven days.     Without s/s of PID.  Pending: Labs Reviewed  CERVICOVAGINAL ANCILLARY ONLY     Will notify of any positive results. Instructed to refrain from sexual activity for at least seven days.  Reviewed expectations re: course of current medical issues. Questions answered. Outlined signs and symptoms indicating need for more acute intervention. Patient verbalized understanding. After Visit Summary given.   SUBJECTIVE:  Kimberly Harper is a 35 y.o. female who presents with complaint of vaginal discharge. Onset gradual. First noticed over this past week. Describes discharge as thick and opaque/light reddish-brown; with odor. No specific aggravating or alleviating factors reported. Denies: urinary frequency, dysuria and gross hematuria. Afebrile. No abdominal or pelvic pain. Normal PO intake wihout n/v. No genital rashes or lesions. Reports that she is not sexually active. OTC treatment: none; h/o yeast infections but this different.   Patient's last menstrual period was 09/05/2020. Always very regular.   OBJECTIVE:  Vitals:   10/02/20 0842  BP: 117/76  Pulse: 62  Resp: 18  Temp: 98.5 F (36.9 C)  TempSrc: Oral  SpO2: 98%     General appearance: alert, cooperative, appears stated age and no distress Lungs: unlabored respirations; speaks full sentences without difficulty Back: no CVA tenderness; FROM at waist Abdomen: soft, non-tender GU: deferred Skin: warm and dry Psychological: alert and cooperative;  normal mood and affect.    Labs Reviewed  CERVICOVAGINAL ANCILLARY ONLY    Allergies  Allergen Reactions  . Multiple Vitamins-Iron Swelling    States that vitamins and iron have caused swelling of tongue and gums    Past Medical History:  Diagnosis Date  . Alcoholic (HCC)   . Anemia    Phreesia 01/17/2020  . Anxiety   . Asthma   . Bipolar 1 disorder (HCC)   . Blood transfusion without reported diagnosis    [facility]reesia 01/17/2020  . Chronic headaches   . Depression    Phreesia 01/17/2020  . Major depressive disorder   . Substance abuse (HCC)    Phreesia 01/17/2020   Family History  Problem Relation Age of Onset  . Hypertension Mother   . Diabetes Mother   . Stroke Mother   . Depression Mother   . Depression Sister   . Irritable bowel syndrome Sister   . Depression Brother   . Alcohol abuse Maternal Grandfather   . Alcohol abuse Paternal Grandfather   . Diabetes Maternal Grandmother   . Breast cancer Paternal Grandmother    Social History   Socioeconomic History  . Marital status: Single    Spouse name: Not on file  . Number of children: 1  . Years of education: Not on file  . Highest education level: Not on file  Occupational History  . Occupation: Academic librarian  Tobacco Use  . Smoking status: Former Smoker    Types: Cigarettes    Quit date: 09/07/2006    Years since quitting: 14.0  . Smokeless tobacco: Never Used  Vaping Use  . Vaping Use: Never used  Substance and Sexual Activity  .  Alcohol use: Yes    Comment: socially and self destructively  . Drug use: Not Currently    Types: Marijuana  . Sexual activity: Yes    Birth control/protection: None  Other Topics Concern  . Not on file  Social History Narrative  . Not on file   Social Determinants of Health   Financial Resource Strain: Not on file  Food Insecurity: Not on file  Transportation Needs: Not on file  Physical Activity: Not on file  Stress: Not on file  Social Connections: Not on  file  Intimate Partner Violence: Not on file          Mardella Layman, MD 10/02/20 [phone]

## 2020-10-03 LAB — CERVICOVAGINAL ANCILLARY ONLY
Bacterial Vaginitis (gardnerella): POSITIVE — AB
Candida Glabrata: NEGATIVE
Candida Vaginitis: NEGATIVE
Chlamydia: NEGATIVE
Comment: NEGATIVE
Comment: NEGATIVE
Comment: NEGATIVE
Comment: NEGATIVE
Comment: NEGATIVE
Comment: NORMAL
Neisseria Gonorrhea: NEGATIVE
Trichomonas: NEGATIVE

## 2020-10-04 ENCOUNTER — Telehealth (HOSPITAL_COMMUNITY): Payer: Self-pay | Admitting: Emergency Medicine

## 2020-10-04 MED ORDER — METRONIDAZOLE 500 MG PO TABS: 500.0000 mg | ORAL_TABLET | Freq: Two times a day (BID) | ORAL | 0 refills | Status: DC

## 2020-10-09 ENCOUNTER — Other Ambulatory Visit: Payer: Self-pay

## 2020-10-09 DIAGNOSIS — R109 Unspecified abdominal pain: Secondary | ICD-10-CM

## 2020-10-09 DIAGNOSIS — R1114 Bilious vomiting: Secondary | ICD-10-CM

## 2020-10-09 DIAGNOSIS — F5000 Anorexia nervosa, unspecified: Secondary | ICD-10-CM

## 2020-10-13 ENCOUNTER — Telehealth (INDEPENDENT_AMBULATORY_CARE_PROVIDER_SITE_OTHER): Payer: 59 | Admitting: Psychiatry

## 2020-10-13 ENCOUNTER — Other Ambulatory Visit: Payer: Self-pay

## 2020-10-13 DIAGNOSIS — F410 Panic disorder [episodic paroxysmal anxiety] without agoraphobia: Secondary | ICD-10-CM

## 2020-10-13 DIAGNOSIS — F411 Generalized anxiety disorder: Secondary | ICD-10-CM

## 2020-10-13 DIAGNOSIS — F3181 Bipolar II disorder: Secondary | ICD-10-CM

## 2020-10-13 DIAGNOSIS — F603 Borderline personality disorder: Secondary | ICD-10-CM | POA: Diagnosis not present

## 2020-10-13 MED ORDER — QUETIAPINE FUMARATE 300 MG PO TABS: 300.0000 mg | ORAL_TABLET | Freq: Every day | ORAL | 2 refills | Status: DC

## 2020-10-13 MED ORDER — BUPROPION HCL ER (XL) 300 MG PO TB24: 300.0000 mg | ORAL_TABLET | ORAL | 2 refills | Status: DC

## 2020-10-13 MED ORDER — ALPRAZOLAM 1 MG PO TABS: 1.0000 mg | ORAL_TABLET | Freq: Three times a day (TID) | ORAL | 2 refills | Status: DC | PRN

## 2020-10-13 MED ORDER — TRAZODONE HCL 100 MG PO TABS: ORAL_TABLET | ORAL | 1 refills | Status: DC
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
## 2020-10-15 ENCOUNTER — Other Ambulatory Visit: Payer: Self-pay | Admitting: Gastroenterology

## 2020-10-15 DIAGNOSIS — R109 Unspecified abdominal pain: Secondary | ICD-10-CM

## 2020-10-15 DIAGNOSIS — R1114 Bilious vomiting: Secondary | ICD-10-CM

## 2020-10-15 DIAGNOSIS — F5000 Anorexia nervosa, unspecified: Secondary | ICD-10-CM

## 2020-10-15 MED ORDER — DICYCLOMINE HCL 10 MG PO CAPS
10.0000 mg | ORAL_CAPSULE | Freq: Four times a day (QID) | ORAL | 0 refills | Status: DC | PRN
Start: 2020-10-15 — End: 2020-10-15

## 2020-10-17 ENCOUNTER — Other Ambulatory Visit: Payer: Self-pay | Admitting: Registered Nurse

## 2020-10-17 DIAGNOSIS — R1084 Generalized abdominal pain: Secondary | ICD-10-CM

## 2020-10-17 DIAGNOSIS — R1114 Bilious vomiting: Secondary | ICD-10-CM

## 2020-10-20 MED ORDER — ONDANSETRON HCL 4 MG PO TABS: 4.0000 mg | ORAL_TABLET | Freq: Three times a day (TID) | ORAL | 0 refills | Status: DC | PRN

## 2020-10-22 ENCOUNTER — Encounter: Payer: 59 | Admitting: Gastroenterology

## 2020-11-10 ENCOUNTER — Encounter: Payer: 59 | Admitting: Gastroenterology

## 2020-11-13 ENCOUNTER — Other Ambulatory Visit: Payer: Self-pay | Admitting: Registered Nurse

## 2020-11-13 DIAGNOSIS — R1114 Bilious vomiting: Secondary | ICD-10-CM

## 2020-11-13 DIAGNOSIS — R1084 Generalized abdominal pain: Secondary | ICD-10-CM

## 2020-11-15 MED ORDER — ONDANSETRON HCL 4 MG PO TABS: 4.0000 mg | ORAL_TABLET | Freq: Three times a day (TID) | ORAL | 0 refills | Status: DC | PRN

## 2020-11-22 ENCOUNTER — Encounter: Payer: 59 | Admitting: Nurse Practitioner

## 2020-11-29 ENCOUNTER — Encounter: Payer: 59 | Admitting: Gastroenterology

## 2020-12-03 ENCOUNTER — Ambulatory Visit
Admission: EM | Admit: 2020-12-03 | Discharge: 2020-12-03 | Disposition: A | Discharge status: Home / Self Care | Payer: 59

## 2020-12-05 ENCOUNTER — Ambulatory Visit
Admission: EM | Admit: 2020-12-05 | Discharge: 2020-12-05 | Disposition: A | Discharge status: Home / Self Care | Payer: 59 | Attending: Emergency Medicine | Admitting: Emergency Medicine

## 2020-12-05 ENCOUNTER — Encounter: Payer: Self-pay | Admitting: Emergency Medicine

## 2020-12-05 ENCOUNTER — Other Ambulatory Visit: Payer: Self-pay

## 2020-12-05 DIAGNOSIS — Z113 Encounter for screening for infections with a predominantly sexual mode of transmission: Secondary | ICD-10-CM | POA: Insufficient documentation

## 2020-12-05 DIAGNOSIS — R102 Pelvic and perineal pain: Secondary | ICD-10-CM | POA: Insufficient documentation

## 2020-12-05 LAB — POCT URINE PREGNANCY: Preg Test, Ur: NEGATIVE

## 2020-12-05 MED ORDER — NAPROXEN 500 MG PO TABS: 500.0000 mg | ORAL_TABLET | Freq: Two times a day (BID) | ORAL | 0 refills | Status: DC

## 2020-12-05 MED ORDER — FLUCONAZOLE 150 MG PO TABS: 150.0000 mg | ORAL_TABLET | Freq: Once | ORAL | 0 refills | Status: AC

## 2020-12-05 NOTE — ED Triage Notes (Signed)
Pt requesting STD screening; pt sts some lesions noted and vaginal irritation

## 2020-12-05 NOTE — ED Provider Notes (Signed)
[name]    CSN: [id number] Arrival date & time: 12/05/20  1405      History   Chief Complaint Chief Complaint  Patient presents with  . Exposure to STD    HPI Kimberly Harper is a 35 y.o. female history of bipolar disorder, presenting today for evaluation of STD screening.  Patient reports that over the past few days she has developed some vaginal irritation as well as a lesion to her perineum.  She reports that last intercourse was a few weeks ago.  Denies any discharge different from her normal discharge.  Denies any pelvic pain.  Denies urinary symptoms.  HPI  Past Medical History:  Diagnosis Date  . Alcoholic (HCC)   . Anemia    Phreesia 01/17/2020  . Anxiety   . Asthma   . Bipolar 1 disorder (HCC)   . Blood transfusion without reported diagnosis    [employer]reesia 01/17/2020  . Chronic headaches   . Depression    Phreesia 01/17/2020  . Major depressive disorder   . Substance abuse (HCC)    [employer]reesia 01/17/2020    Patient Active Problem List   Diagnosis Date Noted  . Panic disorder 04/12/2020  . GAD (generalized anxiety disorder) 03/18/2020  . 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 Borderline personality disorder (HCC) 02/06/2020  . Bipolar 2 disorder, major depressive episode (HCC) 12/06/2019    Past Surgical History:  Procedure Laterality Date  . CESAREAN SECTION N/A    [employer]reesia 01/17/2020  . DILATION AND CURETTAGE OF UTERUS    . HAND SURGERY Right   . WISDOM TOOTH EXTRACTION      OB History   No obstetric history on file.      Home Medications    Prior to Admission medications   Medication Sig Start Date End Date Taking? Authorizing Provider  fluconazole (DIFLUCAN) 150 MG tablet Take 1 tablet (150 mg total) by mouth once for 1 dose. 12/05/20 12/05/20 Yes Paulanthony Gleaves C, PA-C  naproxen (NAPROSYN) 500 MG tablet Take 1 tablet (500 mg total) by mouth 2 (two) times daily. 12/05/20  Yes Zayda Angell C, PA-C  ALPRAZolam (XANAX) 1 MG  tablet Take 1 tablet (1 mg total) by mouth 3 (three) times daily as needed for sleep or anxiety. 10/13/20 01/11/21  Pucilowski, Roosvelt Maser, MD  buPROPion (WELLBUTRIN XL) 300 MG 24 hr tablet Take 1 tablet (300 mg total) by mouth every morning. 10/13/20 01/11/21  Pucilowski, Roosvelt Maser, MD  dicyclomine (BENTYL) 10 MG capsule TAKE 1 CAPSULE(10 MG) BY MOUTH FOUR TIMES DAILY AS NEEDED FOR SPASMS 10/15/20   Tressia Danas, MD  metroNIDAZOLE (FLAGYL) 500 MG tablet Take 1 tablet (500 mg total) by mouth 2 (two) times daily. 10/04/20   Lamptey, Britta Mccreedy, MD  ondansetron (ZOFRAN) 4 MG tablet TAKE 1 TABLET(4 MG) BY MOUTH EVERY 8 HOURS AS NEEDED FOR NAUSEA OR VOMITING 09/15/20   Janeece Agee, NP  ondansetron (ZOFRAN) 4 MG tablet Take 1 tablet (4 mg total) by mouth every 8 (eight) hours as needed for nausea or vomiting. 11/15/20   Janeece Agee, NP  QUEtiapine (SEROQUEL) 300 MG tablet Take 1 tablet (300 mg total) by mouth at bedtime. 10/13/20 01/11/21  Pucilowski, Roosvelt Maser, MD  traZODone (DESYREL) 100 MG tablet TAKE 1 TABLET(100 MG) BY MOUTH AT BEDTIME AS NEEDED FOR SLEEP 11/13/20   Pucilowski, Olgierd A, MD  montelukast (SINGULAIR) 10 MG tablet Take 1 tablet (10 mg total) by mouth at bedtime. 01/19/20 02/07/20  Janeece Agee, NP  Family History Family History  Problem Relation Age of Onset  . Hypertension Mother   . Diabetes Mother   . Stroke Mother   . Depression Mother   . Depression Sister   . Irritable bowel syndrome Sister   . Depression Brother   . Alcohol abuse Maternal Grandfather   . Alcohol abuse Paternal Grandfather   . Diabetes Maternal Grandmother   . Breast cancer Paternal Grandmother     Social History Social History   Tobacco Use  . Smoking status: Former Smoker    Types: Cigarettes    Quit date: 09/07/2006    Years since quitting: 14.2  . Smokeless tobacco: Never Used  Vaping Use  . Vaping Use: Never used  Substance Use Topics  . Alcohol use: Yes    Comment: socially and self  destructively  . Drug use: Not Currently    Types: Marijuana     Allergies   Multiple vitamins-iron   Review of Systems Review of Systems  Constitutional: Negative for fever.  Respiratory: Negative for shortness of breath.   Cardiovascular: Negative for chest pain.  Gastrointestinal: Negative for abdominal pain, diarrhea, nausea and vomiting.  Genitourinary: Positive for genital sores. Negative for dysuria, flank pain, hematuria, menstrual problem, vaginal bleeding, vaginal discharge and vaginal pain.  Musculoskeletal: Negative for back pain.  Skin: Negative for rash.  Neurological: Negative for dizziness, light-headedness and headaches.     Physical Exam Triage Vital Signs ED Triage Vitals  Enc Vitals Group     BP 12/05/20 1458 133/68     Pulse Rate 12/05/20 1458 62     Resp 12/05/20 1458 16     Temp 12/05/20 1458 98.4 F (36.9 C)     Temp Source 12/05/20 1458 Oral     SpO2 12/05/20 1458 98 %     Weight --      Height --      Head Circumference --      Peak Flow --      Pain Score 12/05/20 1456 0     Pain Loc --      Pain Edu? --      Excl. in GC? --    No data found.  Updated Vital Signs BP 133/68 (BP Location: Left Arm)   Pulse 62   Temp 98.4 F (36.9 C) (Oral)   Resp 16   SpO2 98%   Visual Acuity Right Eye Distance:   Left Eye Distance:   Bilateral Distance:    Right Eye Near:   Left Eye Near:    Bilateral Near:     Physical Exam Vitals and nursing note reviewed.  Constitutional:      Appearance: She is well-developed.     Comments: No acute distress  HENT:     Head: Normocephalic and atraumatic.     Nose: Nose normal.  Eyes:     Conjunctiva/sclera: Conjunctivae normal.  Cardiovascular:     Rate and Rhythm: Normal rate.  Pulmonary:     Effort: Pulmonary effort is normal. No respiratory distress.  Abdominal:     General: There is no distension.  Genitourinary:    Comments: Normal external female genitalia, 2 linear fissures noted to  perineum, no obvious sores or ulcers noted, vagina with moderate amount of thick white discharge present, os with small circular area of hyperemic area Musculoskeletal:        General: Normal range of motion.     Cervical back: Neck supple.  Skin:    General: Skin  is warm and dry.  Neurological:     Mental Status: She is alert and oriented to person, place, and time.      UC Treatments / Results  Labs (all labs ordered are listed, but only abnormal results are displayed) Labs Reviewed  HSV CULTURE AND TYPING  HIV ANTIBODY (ROUTINE TESTING W REFLEX)  RPR  POCT URINE PREGNANCY  CERVICOVAGINAL ANCILLARY ONLY    EKG   Radiology No results found.  Procedures Procedures (including critical care time)  Medications Ordered in UC Medications - No data to display  Initial Impression / Assessment and Plan / UC Course  I have reviewed the triage vital signs and the nursing notes.  Pertinent labs & imaging results that were available during my care of the patient were reviewed by me and considered in my medical decision making (see chart for details).     STD screening with vaginal swab, HSV swab for lesions and HIV and syphilis with blood work.  Empirically treating for yeast given amount of thick white discharge noted in vagina, Naprosyn for pain in the meantime.  Open areas to perineum appear more suggestive of fissures rather than HSV ulcers, deferring any empiric treatment and recommending symptomatic and supportive care.  We will call with results and provide further treatment as needed.  Discussed strict return precautions. Patient verbalized understanding and is agreeable with plan.  Final Clinical Impressions(s) / UC Diagnoses   Final diagnoses:  Screen for STD (sexually transmitted disease)  Pelvic pain     Discharge Instructions     Naprosyn twice daily with food for pelvic pain Diflucan 1 tablet today, repeat in 72 hours if still having symptoms Vaginal swab  pending to screen for gonorrhea, chlamydia, trichomonas, yeast and BV Swab pending to screen for HSV  Use lidocaine jelly, A&E ointment and warm soaks with baking soda to further help soothe area Please follow-up with OB/GYN to have Pap smear updated    ED Prescriptions    Medication Sig Dispense Auth. Provider   naproxen (NAPROSYN) 500 MG tablet Take 1 tablet (500 mg total) by mouth 2 (two) times daily. 30 tablet Mylah Baynes C, PA-C   fluconazole (DIFLUCAN) 150 MG tablet Take 1 tablet (150 mg total) by mouth once for 1 dose. 2 tablet D'Arcy Abraha, Liberty C, PA-C     PDMP not reviewed this encounter.   Lew Dawes, PA-C 12/05/20 1710

## 2020-12-05 NOTE — Discharge Instructions (Signed)
Naprosyn twice daily with food for pelvic pain Diflucan 1 tablet today, repeat in 72 hours if still having symptoms Vaginal swab pending to screen for gonorrhea, chlamydia, trichomonas, yeast and BV Swab pending to screen for HSV  Use lidocaine jelly, A&E ointment and warm soaks with baking soda to further help soothe area Please follow-up with OB/GYN to have Pap smear updated

## 2020-12-06 ENCOUNTER — Other Ambulatory Visit: Payer: Self-pay | Admitting: Registered Nurse

## 2020-12-06 DIAGNOSIS — R1084 Generalized abdominal pain: Secondary | ICD-10-CM

## 2020-12-06 DIAGNOSIS — R1114 Bilious vomiting: Secondary | ICD-10-CM

## 2020-12-07 ENCOUNTER — Other Ambulatory Visit: Payer: Self-pay | Admitting: Registered Nurse

## 2020-12-07 DIAGNOSIS — R1114 Bilious vomiting: Secondary | ICD-10-CM

## 2020-12-07 DIAGNOSIS — R1084 Generalized abdominal pain: Secondary | ICD-10-CM

## 2020-12-07 LAB — CERVICOVAGINAL ANCILLARY ONLY
Bacterial Vaginitis (gardnerella): NEGATIVE
Candida Glabrata: NEGATIVE
Candida Vaginitis: NEGATIVE
Chlamydia: NEGATIVE
Comment: NEGATIVE
Comment: NEGATIVE
Comment: NEGATIVE
Comment: NEGATIVE
Comment: NEGATIVE
Comment: NORMAL
Neisseria Gonorrhea: NEGATIVE
Trichomonas: NEGATIVE

## 2020-12-07 NOTE — Telephone Encounter (Signed)
Pt requesting refill Zofran Kimberly Harper previously rx for general abdominal pain and bolis vomiting. Please advise

## 2020-12-08 LAB — HIV ANTIBODY (ROUTINE TESTING W REFLEX): HIV Screen 4th Generation wRfx: NONREACTIVE

## 2020-12-08 LAB — RPR: RPR Ser Ql: NONREACTIVE

## 2020-12-08 LAB — HSV CULTURE AND TYPING

## 2020-12-13 MED ORDER — ONDANSETRON HCL 4 MG PO TABS
4.0000 mg | ORAL_TABLET | Freq: Three times a day (TID) | ORAL | 0 refills | Status: DC | PRN
Start: 2020-12-13 — End: 2021-01-16

## 2020-12-15 ENCOUNTER — Other Ambulatory Visit: Payer: Self-pay

## 2020-12-15 ENCOUNTER — Encounter (HOSPITAL_COMMUNITY): Payer: Self-pay

## 2020-12-15 ENCOUNTER — Emergency Department (HOSPITAL_COMMUNITY)
Admission: EM | Admit: 2020-12-15 | Discharge: 2020-12-15 | Disposition: A | Discharge status: Home / Self Care | Payer: 59 | Attending: Emergency Medicine | Admitting: Emergency Medicine

## 2020-12-15 DIAGNOSIS — J45909 Unspecified asthma, uncomplicated: Secondary | ICD-10-CM | POA: Diagnosis not present

## 2020-12-15 DIAGNOSIS — E119 Type 2 diabetes mellitus without complications: Secondary | ICD-10-CM | POA: Insufficient documentation

## 2020-12-15 DIAGNOSIS — H9203 Otalgia, bilateral: Secondary | ICD-10-CM | POA: Diagnosis not present

## 2020-12-15 DIAGNOSIS — R059 Cough, unspecified: Secondary | ICD-10-CM | POA: Diagnosis present

## 2020-12-15 DIAGNOSIS — Z87891 Personal history of nicotine dependence: Secondary | ICD-10-CM | POA: Diagnosis not present

## 2020-12-15 DIAGNOSIS — U071 COVID-19: Secondary | ICD-10-CM | POA: Diagnosis not present

## 2020-12-15 DIAGNOSIS — Z28310 Unvaccinated for covid-19: Secondary | ICD-10-CM | POA: Insufficient documentation

## 2020-12-15 LAB — I-STAT CHEM 8, ED
BUN: 8 mg/dL (ref 6–20)
Calcium, Ion: 1.14 mmol/L — ABNORMAL LOW (ref 1.15–1.40)
Chloride: 103 mmol/L (ref 98–111)
Creatinine, Ser: 0.8 mg/dL (ref 0.44–1.00)
Glucose, Bld: 97 mg/dL (ref 70–99)
HCT: 36 % (ref 36.0–46.0)
Hemoglobin: 12.2 g/dL (ref 12.0–15.0)
Potassium: 3.6 mmol/L (ref 3.5–5.1)
Sodium: 139 mmol/L (ref 135–145)
TCO2: 24 mmol/L (ref 22–32)

## 2020-12-15 LAB — RESP PANEL BY RT-PCR (FLU A&B, COVID) ARPGX2
Influenza A by PCR: NEGATIVE
Influenza B by PCR: NEGATIVE
SARS Coronavirus 2 by RT PCR: POSITIVE — AB

## 2020-12-15 LAB — GROUP A STREP BY PCR: Group A Strep by PCR: NOT DETECTED

## 2020-12-15 LAB — I-STAT BETA HCG BLOOD, ED (MC, WL, AP ONLY): I-stat hCG, quantitative: <5 m[IU]/mL (ref <5)

## 2020-12-15 MED ORDER — KETOROLAC TROMETHAMINE 30 MG/ML IJ SOLN
15.0000 mg | Freq: Once | INTRAMUSCULAR | Status: AC
  Administered 2020-12-15: 15 mg via INTRAVENOUS
  Filled 2020-12-15: qty 1

## 2020-12-15 MED ORDER — ACETAMINOPHEN 325 MG PO TABS
650.0000 mg | ORAL_TABLET | Freq: Once | ORAL | Status: AC | PRN
  Administered 2020-12-15: 650 mg via ORAL
  Filled 2020-12-15: qty 2

## 2020-12-15 MED ORDER — ACETAMINOPHEN 325 MG PO TABS
325.0000 mg | ORAL_TABLET | Freq: Once | ORAL | Status: AC | PRN
  Administered 2020-12-15: 325 mg via ORAL
  Filled 2020-12-15: qty 1

## 2020-12-15 MED ORDER — SODIUM CHLORIDE 0.9 % IV BOLUS
1000.0000 mL | Freq: Once | INTRAVENOUS | Status: AC
  Administered 2020-12-15: 1000 mL via INTRAVENOUS

## 2020-12-15 NOTE — ED Provider Notes (Signed)
[facility]-EMERGENCY DEPT Provider Note   CSN: [id number] Arrival date & time: 12/15/20  1957     History Chief Complaint  Patient presents with  . Covid Positive    Kimberly Harper is a 35 y.o. female.  HPI      Jennene Ilka Lovick is a 35 y.o. female, with a history of asthma, bipolar, anxiety, presenting to the ED with sore throat, bilateral ear pain, chills, cough, body aches beginning May 22.  She states she had a positive home COVID test.  She has not had COVID vaccinations. She has taken a dose of naproxen without resolution.  Denies shortness of breath, chest discomfort, dizziness, syncope, rash, neck stiffness/pain, abdominal pain, N/V/D, or any other complaints.   Past Medical History:  Diagnosis Date  . Alcoholic (HCC)   . Anemia    Phreesia 01/17/2020  . Anxiety   . Asthma   . Bipolar 1 disorder (HCC)   . Blood transfusion without reported diagnosis    Phreesia 01/17/2020  . Chronic headaches   . Depression    Phreesia 01/17/2020  . Major depressive disorder   . Substance abuse (HCC)    Phreesia 01/17/2020    Patient Active Problem List   Diagnosis Date Noted  . Panic disorder 04/12/2020  . GAD (generalized anxiety disorder) 03/18/2020  . 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 Borderline personality disorder (HCC) 02/06/2020  . Bipolar 2 disorder, major depressive episode (HCC) 12/06/2019    Past Surgical History:  Procedure Laterality Date  . CESAREAN SECTION N/A    [employer]reesia 01/17/2020  . DILATION AND CURETTAGE OF UTERUS    . HAND SURGERY Right   . WISDOM TOOTH EXTRACTION       OB History   No obstetric history on file.     Family History  Problem Relation Age of Onset  . Hypertension Mother   . Diabetes Mother   . Stroke Mother   . Depression Mother   . Depression Sister   . Irritable bowel syndrome Sister   . Depression Brother   . Alcohol abuse Maternal Grandfather   . Alcohol abuse Paternal  Grandfather   . Diabetes Maternal Grandmother   . Breast cancer Paternal Grandmother     Social History   Tobacco Use  . Smoking status: Former Smoker    Types: Cigarettes    Quit date: 09/07/2006    Years since quitting: 14.2  . Smokeless tobacco: Never Used  Vaping Use  . Vaping Use: Never used  Substance Use Topics  . Alcohol use: Yes    Comment: socially and self destructively  . Drug use: Not Currently    Types: Marijuana    Home Medications Prior to Admission medications   Medication Sig Start Date End Date Taking? Authorizing Provider  ALPRAZolam Prudy Feeler) 1 MG tablet Take 1 tablet (1 mg total) by mouth 3 (three) times daily as needed for sleep or anxiety. 10/13/20 01/11/21  Pucilowski, Roosvelt Maser, MD  buPROPion (WELLBUTRIN XL) 300 MG 24 hr tablet Take 1 tablet (300 mg total) by mouth every morning. 10/13/20 01/11/21  Pucilowski, Roosvelt Maser, MD  dicyclomine (BENTYL) 10 MG capsule TAKE 1 CAPSULE(10 MG) BY MOUTH FOUR TIMES DAILY AS NEEDED FOR SPASMS 10/15/20   Tressia Danas, MD  metroNIDAZOLE (FLAGYL) 500 MG tablet Take 1 tablet (500 mg total) by mouth 2 (two) times daily. 10/04/20   Merrilee Jansky, MD  naproxen (NAPROSYN) 500 MG tablet Take 1 tablet (500 mg total) by mouth  2 (two) times daily. 12/05/20   Wieters, Hallie C, PA-C  ondansetron (ZOFRAN) 4 MG tablet TAKE 1 TABLET(4 MG) BY MOUTH EVERY 8 HOURS AS NEEDED FOR NAUSEA OR VOMITING 09/15/20   Janeece Agee, NP  ondansetron (ZOFRAN) 4 MG tablet Take 1 tablet (4 mg total) by mouth every 8 (eight) hours as needed for nausea or vomiting. 12/13/20   Janeece Agee, NP  ondansetron (ZOFRAN) 4 MG tablet TAKE 1 TABLET(4 MG) BY MOUTH EVERY 8 HOURS AS NEEDED FOR NAUSEA OR VOMITING 12/07/20   Sheliah Hatch, MD  QUEtiapine (SEROQUEL) 300 MG tablet Take 1 tablet (300 mg total) by mouth at bedtime. 10/13/20 01/11/21  Pucilowski, Roosvelt Maser, MD  traZODone (DESYREL) 100 MG tablet TAKE 1 TABLET(100 MG) BY MOUTH AT BEDTIME AS NEEDED FOR SLEEP  11/13/20   Pucilowski, Olgierd A, MD  montelukast (SINGULAIR) 10 MG tablet Take 1 tablet (10 mg total) by mouth at bedtime. 01/19/20 02/07/20  Janeece Agee, NP    Allergies    Multiple vitamins-iron  Review of Systems   Review of Systems  Constitutional: Positive for chills.  HENT: Positive for congestion, ear pain and sore throat. Negative for facial swelling, trouble swallowing and voice change.   Respiratory: Positive for cough. Negative for shortness of breath.   Gastrointestinal: Negative for abdominal pain, diarrhea, nausea and vomiting.  Musculoskeletal: Negative for neck pain and neck stiffness.  Skin: Negative for rash.  Neurological: Negative for syncope and weakness.  All other systems reviewed and are negative.   Physical Exam Updated Vital Signs BP 117/84 (BP Location: Left Arm)   Pulse (!) 104   Temp (!) 102.4 F (39.1 C) (Oral)   Resp 16   SpO2 98%   Physical Exam Vitals and nursing note reviewed.  Constitutional:      General: She is not in acute distress.    Appearance: She is well-developed. She is not diaphoretic.  HENT:     Head: Normocephalic and atraumatic.     Right Ear: Tympanic membrane, ear canal and external ear normal.     Left Ear: Tympanic membrane, ear canal and external ear normal.     Nose: Congestion present.     Mouth/Throat:     Mouth: Mucous membranes are moist.     Pharynx: Oropharynx is clear. Posterior oropharyngeal erythema present. No oropharyngeal exudate.  Eyes:     Conjunctiva/sclera: Conjunctivae normal.  Cardiovascular:     Rate and Rhythm: Normal rate and regular rhythm.     Pulses: Normal pulses.          Radial pulses are 2+ on the right side and 2+ on the left side.       Posterior tibial pulses are 2+ on the right side and 2+ on the left side.     Heart sounds: Normal heart sounds.     Comments: Borderline tachycardic on my exam. Pulmonary:     Effort: Pulmonary effort is normal. No respiratory distress.      Breath sounds: Normal breath sounds.  Abdominal:     Palpations: Abdomen is soft.     Tenderness: There is no abdominal tenderness. There is no guarding.  Musculoskeletal:     Cervical back: Normal range of motion and neck supple.     Right lower leg: No edema.     Left lower leg: No edema.  Lymphadenopathy:     Cervical: No cervical adenopathy.  Skin:    General: Skin is warm and dry.  Neurological:  Mental Status: She is alert.  Psychiatric:        Mood and Affect: Mood and affect normal.        Speech: Speech normal.        Behavior: Behavior normal.     ED Results / Procedures / Treatments   Labs (all labs ordered are listed, but only abnormal results are displayed) Labs Reviewed  RESP PANEL BY RT-PCR (FLU A&B, COVID) ARPGX2 - Abnormal; Notable for the following components:      Result Value   SARS Coronavirus 2 by RT PCR POSITIVE (*)    All other components within normal limits  I-STAT CHEM 8, ED - Abnormal; Notable for the following components:   Calcium, Ion 1.14 (*)    All other components within normal limits  GROUP A STREP BY PCR  I-STAT BETA HCG BLOOD, ED ([facility], [facility], [facility] ONLY)    EKG None  Radiology No results found.  Procedures Procedures   Medications Ordered in ED Medications  acetaminophen (TYLENOL) tablet 325 mg (has no administration in time range)  acetaminophen (TYLENOL) tablet 650 mg (650 mg Oral Given 12/15/20 2203)  sodium chloride 0.9 % bolus 1,000 mL (0 mLs Intravenous Stopped 12/15/20 2256)  ketorolac (TORADOL) 30 MG/ML injection 15 mg (15 mg Intravenous Given 12/15/20 2203)    ED Course  I have reviewed the triage vital signs and the nursing notes.  Pertinent labs & imaging results that were available during my care of the patient were reviewed by me and considered in my medical decision making (see chart for details).    MDM Rules/Calculators/A&P                          Patient presents with chills, body aches, sore throat, cough,  ear pain. Positive COVID test at home. Patient is nontoxic appearing, not tachycardic on my exam, not tachypneic, not hypotensive, maintains excellent SPO2 on room air, and is in no apparent distress.  Febrile. I have reviewed the patient's chart to obtain more information.   I reviewed and interpreted the patient's labs.  Discussed and offered steroids for symptom management.  Patient states she thinks she had a bad reaction to steroids that caused mania in the past.  The patient was given instructions for home care as well as return precautions. Patient voices understanding of these instructions, accepts the plan, and is comfortable with discharge.     Final Clinical Impression(s) / ED Diagnoses Final diagnoses:  COVID-19    Rx / DC Orders ED Discharge Orders    None       Concepcion Living 12/15/20 2313    Wynetta Fines, MD 12/22/20 [phone]

## 2020-12-15 NOTE — Discharge Instructions (Addendum)
COVID-19 Home Management:  You have had a positive test for COVID-19. COVID-19 is caused by a virus. Viruses do not require or respond to antibiotics. Treatment is symptomatic care and it is important to note that these symptoms may last for 7-14 days.   Hand washing: Wash your hands throughout the day, but especially before and after touching the face, using the restroom, sneezing, coughing, or touching surfaces that have been coughed or sneezed upon. Hydration: Symptoms of most illnesses will be intensified and complicated by dehydration. Dehydration can also extend the duration of symptoms. Drink plenty of fluids and get plenty of rest. You should be drinking at least half a liter of water an hour to stay hydrated. Electrolyte drinks (ex. Gatorade, Powerade, Pedialyte) are also encouraged. You should be drinking enough fluids to make your urine light yellow, almost clear. If this is not the case, you are not drinking enough water. Please note that some of the treatments indicated below will not be effective if you are not adequately hydrated. Diet: Please concentrate on hydration, however, you may introduce food slowly.  Start with a clear liquid diet, progressed to a full liquid diet, and then bland solids as you are able. Pain or fever: Ibuprofen, Naproxen, or acetaminophen (generic for Tylenol) for pain or fever.  Antiinflammatory medications: Take 600 mg of ibuprofen every 6 hours or 440 mg (over the counter dose) to 500 mg (prescription dose) of naproxen every 12 hours for the next 3 days. After this time, these medications may be used as needed for pain. Take these medications with food to avoid upset stomach. Choose only one of these medications, do not take them together. Acetaminophen (generic for Tylenol): Should you continue to have additional pain while taking the ibuprofen or naproxen, you may add in acetaminophen as needed. Your daily total maximum amount of acetaminophen from all sources  should be limited to 4000mg/day for persons without liver problems, or 2000mg/day for those with liver problems. Diarrhea: May use medications such as loperamide (Imodium) or Bismuth subsalicylate (Pepto-Bismol). Cough: Teas, warm liquids, broths, and honey can help with cough. Zyrtec or Claritin: May add these medication daily to control underlying symptoms of congestion, sneezing, and other signs of allergies.  These medications are available over-the-counter. Generics: Cetirizine (generic for Zyrtec) and loratadine (generic for Claritin). Fluticasone: Use fluticasone (generic for Flonase), as directed, for nasal and sinus congestion.  This medication is available over-the-counter. Congestion: Plain guaifenesin (generic for plain Mucinex) may help relieve congestion. Saline sinus rinses and saline nasal sprays may also help relieve congestion.  Sore throat: Warm liquids or Chloraseptic spray may help soothe a sore throat. Gargle twice a day with a salt water solution made from a half teaspoon of salt in a cup of warm water.  Follow up: Follow up with a primary care provider within the next two weeks should symptoms fail to resolve. Return: Return to the ED for significantly worsening symptoms, shortness of breath, persistent/worsening chest pain, persistent vomiting, large amounts of blood in stool, worsening/localized abdominal pain, or any other major concerns.  For prescription assistance, may try using prescription discount sites or apps, such as goodrx.com  COVID-19 isolation recommendations  Patients who have symptoms consistent with COVID-19 should self isolate until: At least 3 days (72 hours) have passed since recovery, defined as resolution of fever without the use of fever reducing medications and improvement in respiratory symptoms (e.g., cough, shortness of breath), and At least 7 days have passed since symptoms first   appeared. Retesting is not required and not recommended as patients  can continue to test positive for several weeks despite lack of symptoms.

## 2020-12-15 NOTE — ED Triage Notes (Signed)
Pt reports sore throat, body aches, and chills since Sunday. Pt reports having a positive at home COVID test today. Pt reports taking Naproxen at home with minimal pain relief.

## 2020-12-16 ENCOUNTER — Encounter (HOSPITAL_COMMUNITY): Payer: Self-pay | Admitting: Emergency Medicine

## 2020-12-16 ENCOUNTER — Encounter: Payer: 59 | Admitting: Nurse Practitioner

## 2020-12-16 ENCOUNTER — Emergency Department (HOSPITAL_COMMUNITY)
Admission: EM | Admit: 2020-12-16 | Discharge: 2020-12-17 | Disposition: A | Discharge status: Home / Self Care | Payer: 59 | Attending: Emergency Medicine | Admitting: Emergency Medicine

## 2020-12-16 ENCOUNTER — Other Ambulatory Visit: Payer: Self-pay

## 2020-12-16 ENCOUNTER — Telehealth: Payer: Self-pay | Admitting: Nurse Practitioner

## 2020-12-16 DIAGNOSIS — Z87891 Personal history of nicotine dependence: Secondary | ICD-10-CM | POA: Diagnosis not present

## 2020-12-16 DIAGNOSIS — J029 Acute pharyngitis, unspecified: Secondary | ICD-10-CM | POA: Diagnosis present

## 2020-12-16 DIAGNOSIS — U071 COVID-19: Secondary | ICD-10-CM | POA: Diagnosis not present

## 2020-12-16 DIAGNOSIS — J45909 Unspecified asthma, uncomplicated: Secondary | ICD-10-CM | POA: Diagnosis not present

## 2020-12-16 NOTE — Telephone Encounter (Signed)
Called to discuss with patient about COVID-19 symptoms and the use of one of the available treatments for those with mild to moderate Covid symptoms and at a high risk of hospitalization.  Pt appears to qualify for outpatient treatment due to co-morbid conditions and/or a member of an at-risk group in accordance with the FDA Emergency Use Authorization.    Symptom onset: 12/12/20 Vaccinated: No Booster? No Immunocompromised? No Qualifiers: asthma, smoker NIH Criteria: 4  Unable to reach pt - Voicemail and Mychart message sent.   Kimberly Alma, NP COVID Treatment Team [phone]

## 2020-12-16 NOTE — ED Triage Notes (Signed)
Patient reports tested Covid positive x2 days ago. Reports continued sore throat.

## 2020-12-17 ENCOUNTER — Emergency Department (HOSPITAL_COMMUNITY): Payer: 59

## 2020-12-17 MED ORDER — MOLNUPIRAVIR EUA 200MG CAPSULE: 4.0000 | ORAL_CAPSULE | Freq: Two times a day (BID) | ORAL | 0 refills | Status: AC

## 2020-12-17 NOTE — ED Provider Notes (Signed)
[facility]-EMERGENCY DEPT Provider Note   CSN: [id number] Arrival date & time: 12/16/20  2058     History Chief Complaint  Patient presents with  . Covid Positive    Kimberly Harper is a 35 y.o. female.  Patient with a history of chronic headaches, depression, bipolar disorder recently diagnosed with COVID yesterday.  States she first got ill on May 22 with body aches, sore throat, chills and ear pain.  She had a positive home COVID test on May 22. She states she came back today because she received a call at home regarding treatment for COVID and she wants to have "treatment for my COVID".  She complains of a sore throat worse on the right side.  She complains of body aches, chills, fatigue, nausea but no vomiting.  Denies definite fever at home.  Denies chest pain, shortness of breath, headache, abdominal pain, nausea or vomiting. She had a strep swab yesterday that was negative.  The history is provided by the patient.       Past Medical History:  Diagnosis Date  . Alcoholic (HCC)   . Anemia    Phreesia 01/17/2020  . Anxiety   . Asthma   . Bipolar 1 disorder (HCC)   . Blood transfusion without reported diagnosis    Phreesia 01/17/2020  . Chronic headaches   . Depression    Phreesia 01/17/2020  . Major depressive disorder   . Substance abuse (HCC)    Phreesia 01/17/2020    Patient Active Problem List   Diagnosis Date Noted  . Panic disorder 04/12/2020  . GAD (generalized anxiety disorder) 03/18/2020  . 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 Borderline personality disorder (HCC) 02/06/2020  . Bipolar 2 disorder, major depressive episode (HCC) 12/06/2019    Past Surgical History:  Procedure Laterality Date  . CESAREAN SECTION N/A    Phreesia 01/17/2020  . DILATION AND CURETTAGE OF UTERUS    . HAND SURGERY Right   . WISDOM TOOTH EXTRACTION       OB History   No obstetric history on file.     Family History  Problem Relation Age  of Onset  . Hypertension Mother   . Diabetes Mother   . Stroke Mother   . Depression Mother   . Depression Sister   . Irritable bowel syndrome Sister   . Depression Brother   . Alcohol abuse Maternal Grandfather   . Alcohol abuse Paternal Grandfather   . Diabetes Maternal Grandmother   . Breast cancer Paternal Grandmother     Social History   Tobacco Use  . Smoking status: Former Smoker    Types: Cigarettes    Quit date: 09/07/2006    Years since quitting: 14.2  . Smokeless tobacco: Never Used  Vaping Use  . Vaping Use: Never used  Substance Use Topics  . Alcohol use: Yes    Comment: socially and self destructively  . Drug use: Not Currently    Types: Marijuana    Home Medications Prior to Admission medications   Medication Sig Start Date End Date Taking? Authorizing Provider  ALPRAZolam Prudy Feeler) 1 MG tablet Take 1 tablet (1 mg total) by mouth 3 (three) times daily as needed for sleep or anxiety. 10/13/20 01/11/21  Pucilowski, Roosvelt Maser, MD  buPROPion (WELLBUTRIN XL) 300 MG 24 hr tablet Take 1 tablet (300 mg total) by mouth every morning. 10/13/20 01/11/21  Pucilowski, Olgierd A, MD  dicyclomine (BENTYL) 10 MG capsule TAKE 1 CAPSULE(10 MG) BY MOUTH FOUR  TIMES DAILY AS NEEDED FOR SPASMS 10/15/20   Tressia Danas, MD  metroNIDAZOLE (FLAGYL) 500 MG tablet Take 1 tablet (500 mg total) by mouth 2 (two) times daily. 10/04/20   Lamptey, Britta Mccreedy, MD  naproxen (NAPROSYN) 500 MG tablet Take 1 tablet (500 mg total) by mouth 2 (two) times daily. 12/05/20   Wieters, Hallie C, PA-C  ondansetron (ZOFRAN) 4 MG tablet TAKE 1 TABLET(4 MG) BY MOUTH EVERY 8 HOURS AS NEEDED FOR NAUSEA OR VOMITING 09/15/20   Janeece Agee, NP  ondansetron (ZOFRAN) 4 MG tablet Take 1 tablet (4 mg total) by mouth every 8 (eight) hours as needed for nausea or vomiting. 12/13/20   Janeece Agee, NP  ondansetron (ZOFRAN) 4 MG tablet TAKE 1 TABLET(4 MG) BY MOUTH EVERY 8 HOURS AS NEEDED FOR NAUSEA OR VOMITING 12/07/20    Sheliah Hatch, MD  QUEtiapine (SEROQUEL) 300 MG tablet Take 1 tablet (300 mg total) by mouth at bedtime. 10/13/20 01/11/21  Pucilowski, Roosvelt Maser, MD  traZODone (DESYREL) 100 MG tablet TAKE 1 TABLET(100 MG) BY MOUTH AT BEDTIME AS NEEDED FOR SLEEP 11/13/20   Pucilowski, Olgierd A, MD  montelukast (SINGULAIR) 10 MG tablet Take 1 tablet (10 mg total) by mouth at bedtime. 01/19/20 02/07/20  Janeece Agee, NP    Allergies    Multiple vitamins-iron  Review of Systems   Review of Systems  Constitutional: Positive for activity change, appetite change, chills, fatigue and fever.  HENT: Positive for rhinorrhea and sore throat. Negative for congestion.   Respiratory: Positive for cough. Negative for chest tightness.   Cardiovascular: Negative for chest pain.  Gastrointestinal: Negative for abdominal pain, nausea and vomiting.  Genitourinary: Negative for dysuria and hematuria.  Musculoskeletal: Positive for arthralgias and myalgias.  Skin: Negative for wound.  Neurological: Positive for weakness. Negative for dizziness and headaches.   all other systems are negative except as noted in the HPI and PMH.    Physical Exam Updated Vital Signs BP 111/75 (BP Location: Left Arm)   Pulse 71   Temp 98.5 F (36.9 C) (Oral)   Resp 16   SpO2 100%   Physical Exam Vitals and nursing note reviewed.  Constitutional:      General: She is not in acute distress.    Appearance: She is well-developed.  HENT:     Head: Normocephalic and atraumatic.     Nose: Congestion present.     Mouth/Throat:     Pharynx: Posterior oropharyngeal erythema present. No oropharyngeal exudate.     Comments: Erythematous oropharynx.  Uvula is midline.  No asymmetry.  Tolerating secretions Eyes:     Conjunctiva/sclera: Conjunctivae normal.     Pupils: Pupils are equal, round, and reactive to light.  Neck:     Comments: No meningismus. Cardiovascular:     Rate and Rhythm: Normal rate and regular rhythm.     Heart  sounds: Normal heart sounds. No murmur heard.   Pulmonary:     Effort: Pulmonary effort is normal. No respiratory distress.     Breath sounds: Normal breath sounds.  Abdominal:     Palpations: Abdomen is soft.     Tenderness: There is no abdominal tenderness. There is no guarding or rebound.  Musculoskeletal:        General: No tenderness. Normal range of motion.     Cervical back: Normal range of motion and neck supple.  Skin:    General: Skin is warm.  Neurological:     Mental Status: She is alert and  oriented to person, place, and time.     Cranial Nerves: No cranial nerve deficit.     Motor: No abnormal muscle tone.     Coordination: Coordination normal.     Comments:  5/5 strength throughout. CN 2-12 intact.Equal grip strength.   Psychiatric:        Behavior: Behavior normal.     ED Results / Procedures / Treatments   Labs (all labs ordered are listed, but only abnormal results are displayed) Labs Reviewed - No data to display  EKG None  Radiology No results found.  Procedures Procedures   Medications Ordered in ED Medications - No data to display  ED Course  I have reviewed the triage vital signs and the nursing notes.  Pertinent labs & imaging results that were available during my care of the patient were reviewed by me and considered in my medical decision making (see chart for details).    MDM Rules/Calculators/A&P                         Patient known to be COVID-positive here requesting further treatment.  Her vitals are stable she is in no distress.  Discussed options of antivirals with pharmacy.  Patient on multiple psychiatric medications including Seroquel and trazodone. It is recommended that she hold her trazodone.   Patient refuses chest xray and strep swab.  Uvula midline. No evidence of peritonsillar abscess.   Risks and benefits of molnupiravir d/w patient. This seems a better option than paxlovid given her medication list. Advised to use  effective birth control with this medication.  No hypoxia or increased work of breathing.  Oral hydration at home, quarantine precautions. Return precautions discussed.   Final Clinical Impression(s) / ED Diagnoses Final diagnoses:  COVID-19    Rx / DC Orders ED Discharge Orders         Ordered    molnupiravir EUA 200 mg CAPS  2 times daily        12/17/20 0235           Nancy Arvin, Jeannett Senior, MD 12/17/20 [phone]

## 2020-12-17 NOTE — Discharge Instructions (Signed)
1 second take the medications as prescribed.  Do not get pregnant while taking this medication.  Keep your self quarantined for a total of 14 days.  Return to the ED with difficulty breathing, difficulty swallowing, or any other concerns

## 2020-12-17 NOTE — ED Notes (Signed)
Per [employer], Pt refusing chest xray.

## 2020-12-17 NOTE — ED Notes (Signed)
Pt is refusing Strep swab. EDP notified.

## 2020-12-21 ENCOUNTER — Other Ambulatory Visit: Payer: Self-pay | Admitting: Family

## 2020-12-21 ENCOUNTER — Telehealth (HOSPITAL_COMMUNITY): Payer: Self-pay | Admitting: *Deleted

## 2020-12-21 DIAGNOSIS — F3181 Bipolar II disorder: Secondary | ICD-10-CM

## 2020-12-21 MED ORDER — QUETIAPINE FUMARATE 300 MG PO TABS: 300.0000 mg | ORAL_TABLET | Freq: Every day | ORAL | 0 refills | Status: DC

## 2020-12-21 MED ORDER — ALPRAZOLAM 1 MG PO TABS: 1.0000 mg | ORAL_TABLET | Freq: Three times a day (TID) | ORAL | 0 refills | Status: DC | PRN

## 2020-12-21 NOTE — Telephone Encounter (Signed)
A thirty days bridge supply of Xanax and Seroquel isgiven by Covering MD. Patient must established care with new provider.

## 2020-12-21 NOTE — Telephone Encounter (Signed)
Pt called requesting refills of both Xanax and Seroquel. Both medications were last written by Dr. Hinton Dyer on 10/13/20 with 2 refills. Please review and advise. Thanks.

## 2020-12-29 ENCOUNTER — Other Ambulatory Visit: Payer: Self-pay

## 2020-12-29 ENCOUNTER — Encounter: Payer: Self-pay | Admitting: Emergency Medicine

## 2020-12-29 ENCOUNTER — Ambulatory Visit
Admission: EM | Admit: 2020-12-29 | Discharge: 2020-12-29 | Disposition: A | Discharge status: Home / Self Care | Payer: 59

## 2020-12-29 DIAGNOSIS — Z7689 Persons encountering health services in other specified circumstances: Secondary | ICD-10-CM | POA: Diagnosis not present

## 2020-12-29 DIAGNOSIS — Z8616 Personal history of COVID-19: Secondary | ICD-10-CM

## 2020-12-29 NOTE — Discharge Instructions (Signed)
-  Per CDC guidelines, you are no longer contagious for COVID-19 and can return to work starting tomorrow 6/9.  Work note provided. -Follow-up with primary care provider for lingering concerns.

## 2020-12-29 NOTE — ED Triage Notes (Signed)
Pt present sore throat, fever and cough. Pt recently positive for covid on 12/15/20. Pt states that she would like another covid test.

## 2020-12-29 NOTE — ED Provider Notes (Signed)
[name]    CSN: [id number] Arrival date & time: 12/29/20  1049      History   Chief Complaint Chief Complaint  Patient presents with  . Fever  . Sore Throat  . Cough    HPI Kimberly [facility] is a 35 y.o. female presenting with continued URI symptoms following testing positive for covid-19 on 12/15/2020.  Medical history bipolar 1, chronic headaches, depression, substance abuse, panic disorder.  Notes continued nonproductive cough and subjective chillsx 15 days following covid diagnosis. Denies fevers, n/v/d, shortness of breath, chest pain, congestion, facial pain, teeth pain, headaches, sore throat, loss of taste/smell, swollen lymph nodes, ear pain.    HPI  Past Medical History:  Diagnosis Date  . Alcoholic (HCC)   . Anemia    Phreesia 01/17/2020  . Anxiety   . Asthma   . Bipolar 1 disorder (HCC)   . Blood transfusion without reported diagnosis    Phreesia 01/17/2020  . Chronic headaches   . Depression    Phreesia 01/17/2020  . Major depressive disorder   . Substance abuse (HCC)    Phreesia 01/17/2020    Patient Active Problem List   Diagnosis Date Noted  . Panic disorder 04/12/2020  . GAD (generalized anxiety disorder) 03/18/2020  . 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 Borderline personality disorder (HCC) 02/06/2020  . Bipolar 2 disorder, major depressive episode (HCC) 12/06/2019    Past Surgical History:  Procedure Laterality Date  . CESAREAN SECTION N/A    Phreesia 01/17/2020  . DILATION AND CURETTAGE OF UTERUS    . HAND SURGERY Right   . WISDOM TOOTH EXTRACTION      OB History   No obstetric history on file.      Home Medications    Prior to Admission medications   Medication Sig Start Date End Date Taking? Authorizing Provider  ALPRAZolam Prudy Feeler) 1 MG tablet Take 1 tablet (1 mg total) by mouth 3 (three) times daily as needed for sleep or anxiety. 12/21/20 03/21/21  Arfeen, Phillips Grout, MD  buPROPion (WELLBUTRIN XL) 300 MG 24 hr  tablet Take 1 tablet (300 mg total) by mouth every morning. 10/13/20 01/11/21  Pucilowski, Roosvelt Maser, MD  dicyclomine (BENTYL) 10 MG capsule TAKE 1 CAPSULE(10 MG) BY MOUTH FOUR TIMES DAILY AS NEEDED FOR SPASMS 10/15/20   Tressia Danas, MD  metroNIDAZOLE (FLAGYL) 500 MG tablet Take 1 tablet (500 mg total) by mouth 2 (two) times daily. 10/04/20   Lamptey, Britta Mccreedy, MD  naproxen (NAPROSYN) 500 MG tablet Take 1 tablet (500 mg total) by mouth 2 (two) times daily. 12/05/20   Wieters, Hallie C, PA-C  ondansetron (ZOFRAN) 4 MG tablet TAKE 1 TABLET(4 MG) BY MOUTH EVERY 8 HOURS AS NEEDED FOR NAUSEA OR VOMITING 09/15/20   Janeece Agee, NP  ondansetron (ZOFRAN) 4 MG tablet Take 1 tablet (4 mg total) by mouth every 8 (eight) hours as needed for nausea or vomiting. 12/13/20   Janeece Agee, NP  ondansetron (ZOFRAN) 4 MG tablet TAKE 1 TABLET(4 MG) BY MOUTH EVERY 8 HOURS AS NEEDED FOR NAUSEA OR VOMITING 12/07/20   Sheliah Hatch, MD  QUEtiapine (SEROQUEL) 300 MG tablet Take 1 tablet (300 mg total) by mouth at bedtime. 12/21/20 03/21/21  Arfeen, Phillips Grout, MD  traZODone (DESYREL) 100 MG tablet TAKE 1 TABLET(100 MG) BY MOUTH AT BEDTIME AS NEEDED FOR SLEEP 11/13/20   Pucilowski, Olgierd A, MD  montelukast (SINGULAIR) 10 MG tablet Take 1 tablet (10 mg total) by mouth at  bedtime. 01/19/20 02/07/20  Janeece Agee, NP    Family History Family History  Problem Relation Age of Onset  . Hypertension Mother   . Diabetes Mother   . Stroke Mother   . Depression Mother   . Depression Sister   . Irritable bowel syndrome Sister   . Depression Brother   . Alcohol abuse Maternal Grandfather   . Alcohol abuse Paternal Grandfather   . Diabetes Maternal Grandmother   . Breast cancer Paternal Grandmother     Social History Social History   Tobacco Use  . Smoking status: Former Smoker    Types: Cigarettes    Quit date: 09/07/2006    Years since quitting: 14.3  . Smokeless tobacco: Never Used  Vaping Use  . Vaping Use:  Never used  Substance Use Topics  . Alcohol use: Yes    Comment: socially and self destructively  . Drug use: Not Currently    Types: Marijuana     Allergies   Multiple vitamins-iron   Review of Systems Review of Systems  Constitutional: Negative for appetite change, chills and fever.  HENT: Positive for congestion. Negative for ear pain, rhinorrhea, sinus pressure, sinus pain and sore throat.   Eyes: Negative for redness and visual disturbance.  Respiratory: Positive for cough. Negative for chest tightness, shortness of breath and wheezing.   Cardiovascular: Negative for chest pain and palpitations.  Gastrointestinal: Negative for abdominal pain, constipation, diarrhea, nausea and vomiting.  Genitourinary: Negative for dysuria, frequency and urgency.  Musculoskeletal: Negative for myalgias.  Neurological: Negative for dizziness, weakness and headaches.  Psychiatric/Behavioral: Negative for confusion.  All other systems reviewed and are negative.    Physical Exam Triage Vital Signs ED Triage Vitals [12/29/20 1223]  Enc Vitals Group     BP 106/64     Pulse Rate 80     Resp 16     Temp 98.4 F (36.9 C)     Temp Source Oral     SpO2 100 %     Weight      Height      Head Circumference      Peak Flow      Pain Score      Pain Loc      Pain Edu?      Excl. in GC?    No data found.  Updated Vital Signs BP 106/64 (BP Location: Left Arm)   Pulse 80   Temp 98.4 F (36.9 C) (Oral)   Resp 16   LMP 12/15/2020   SpO2 100%   Visual Acuity Right Eye Distance:   Left Eye Distance:   Bilateral Distance:    Right Eye Near:   Left Eye Near:    Bilateral Near:     Physical Exam Vitals reviewed.  Constitutional:      General: She is not in acute distress.    Appearance: Normal appearance. She is not ill-appearing.  HENT:     Head: Normocephalic and atraumatic.     Right Ear: Hearing, tympanic membrane, ear canal and external ear normal. No swelling or  tenderness. There is no impacted cerumen. No mastoid tenderness. Tympanic membrane is not perforated, erythematous, retracted or bulging.     Left Ear: Hearing, tympanic membrane, ear canal and external ear normal. No swelling or tenderness. There is no impacted cerumen. No mastoid tenderness. Tympanic membrane is not perforated, erythematous, retracted or bulging.     Nose:     Right Sinus: No maxillary sinus tenderness or frontal  sinus tenderness.     Left Sinus: No maxillary sinus tenderness or frontal sinus tenderness.     Mouth/Throat:     Mouth: Mucous membranes are moist.     Pharynx: Uvula midline. No oropharyngeal exudate or posterior oropharyngeal erythema.     Tonsils: No tonsillar exudate.  Cardiovascular:     Rate and Rhythm: Normal rate and regular rhythm.     Heart sounds: Normal heart sounds.  Pulmonary:     Breath sounds: Normal breath sounds and air entry. No wheezing, rhonchi or rales.  Chest:     Chest wall: No tenderness.  Abdominal:     General: Abdomen is flat. Bowel sounds are normal.     Tenderness: There is no abdominal tenderness. There is no guarding or rebound.  Lymphadenopathy:     Cervical: No cervical adenopathy.  Neurological:     General: No focal deficit present.     Mental Status: She is alert and oriented to person, place, and time.  Psychiatric:        Attention and Perception: Attention and perception normal.        Mood and Affect: Mood and affect normal.        Behavior: Behavior normal. Behavior is cooperative.        Thought Content: Thought content normal.        Judgment: Judgment normal.      UC Treatments / Results  Labs (all labs ordered are listed, but only abnormal results are displayed) Labs Reviewed - No data to display  EKG   Radiology No results found.  Procedures Procedures (including critical care time)  Medications Ordered in UC Medications - No data to display  Initial Impression / Assessment and Plan / UC  Course  I have reviewed the triage vital signs and the nursing notes.  Pertinent labs & imaging results that were available during my care of the patient were reviewed by me and considered in my medical decision making (see chart for details).     This patient is a 35 year old female presenting with lingering URI symptoms following COVID diagnosis on 5/25. Today this pt is afebrile nontachycardic nontachypneic, oxygenating well on room air, no wheezes rhonchi or rales.  Reassuring exam.  Clearance to return to work provided as she has completed the mandatory CDC guideline isolation.   Final Clinical Impressions(s) / UC Diagnoses   Final diagnoses:  Return to work evaluation  History of COVID-19     Discharge Instructions     -Per CDC guidelines, you are no longer contagious for COVID-19 and can return to work starting tomorrow 6/9.  Work note provided. -Follow-up with primary care provider for lingering concerns.    ED Prescriptions    None     PDMP not reviewed this encounter.   Rhys Martini, PA-C 12/29/20 1335

## 2020-12-30 ENCOUNTER — Other Ambulatory Visit: Payer: Self-pay

## 2020-12-30 ENCOUNTER — Ambulatory Visit (INDEPENDENT_AMBULATORY_CARE_PROVIDER_SITE_OTHER): Payer: 59 | Admitting: Registered Nurse

## 2020-12-30 ENCOUNTER — Encounter: Payer: Self-pay | Admitting: Neurology

## 2020-12-30 ENCOUNTER — Encounter: Payer: Self-pay | Admitting: Registered Nurse

## 2020-12-30 VITALS — HR 88 | Temp 98.2°F

## 2020-12-30 DIAGNOSIS — G43829 Menstrual migraine, not intractable, without status migrainosus: Secondary | ICD-10-CM | POA: Diagnosis not present

## 2020-12-30 MED ORDER — NARATRIPTAN HCL 1 MG PO TABS: 1.0000 mg | ORAL_TABLET | Freq: Once | ORAL | 0 refills | Status: DC | PRN

## 2020-12-30 MED ORDER — PROPRANOLOL HCL 20 MG PO TABS: 20.0000 mg | ORAL_TABLET | Freq: Every day | ORAL | 1 refills | Status: DC

## 2020-12-30 NOTE — Progress Notes (Signed)
Established Patient Office Visit  Subjective:  Patient ID: Kimberly Harper, female    DOB: 1985-11-24  Age: 35 y.o. MRN: [id number]  CC:  Chief Complaint  Patient presents with   Migraine    Patient states she has been experiencing migraines for about 6 months with no relief. Phq9=22 Gad7=16 she thinks she is losing her memory from medications     HPI Kimberly Harper presents for migraine  Ongoing for 5-6 mo Slow onset L sided pain Photophobia No other neuro deficits during headaches Has had hx of migraine, tx with propranolol in the past with good effect Hopes to restart  Concern for cognitive changes with medications  Feels more forgetful with short term memory loss Would like to be seen by neuro for this and headaches.  Past Medical History:  Diagnosis Date   Alcoholic (HCC)    Anemia    [employer] 01/17/2020   Anxiety    Asthma    Bipolar 1 disorder (HCC)    Blood transfusion without reported diagnosis    [employer] 01/17/2020   Chronic headaches    Depression    [employer] 01/17/2020   Major depressive disorder    Substance abuse (HCC)    [employer] 01/17/2020    Past Surgical History:  Procedure Laterality Date   CESAREAN SECTION N/A    [employer] 01/17/2020   DILATION AND CURETTAGE OF UTERUS     HAND SURGERY Right    WISDOM TOOTH EXTRACTION      Family History  Problem Relation Age of Onset   Hypertension Mother    Diabetes Mother    Stroke Mother    Depression Mother    Depression Sister    Irritable bowel syndrome Sister    Depression Brother    Alcohol abuse Maternal Grandfather    Alcohol abuse Paternal Grandfather    Diabetes Maternal Grandmother    Breast cancer Paternal Grandmother     Social History   Socioeconomic History   Marital status: Single    Spouse name: Not on file   Number of children: 1   Years of education: Not on file   Highest education level: Not on file  Occupational History   Occupation: Ship broker  Tobacco Use   Smoking status: Former    Pack years: 0.00    Types: Cigarettes    Quit date: 09/07/2006    Years since quitting: 14.3   Smokeless tobacco: Never  Vaping Use   Vaping Use: Never used  Substance and Sexual Activity   Alcohol use: Yes    Comment: socially and self destructively   Drug use: Not Currently    Types: Marijuana   Sexual activity: Yes    Birth control/protection: None  Other Topics Concern   Not on file  Social History Narrative   Not on file   Social Determinants of Health   Financial Resource Strain: Not on file  Food Insecurity: Not on file  Transportation Needs: Not on file  Physical Activity: Not on file  Stress: Not on file  Social Connections: Not on file  Intimate Partner Violence: Not on file    Outpatient Medications Prior to Visit  Medication Sig Dispense Refill   ALPRAZolam (XANAX) 1 MG tablet Take 1 tablet (1 mg total) by mouth 3 (three) times daily as needed for sleep or anxiety. 90 tablet 0   naproxen (NAPROSYN) 500 MG tablet Take 1 tablet (500 mg total) by mouth 2 (two) times daily. 30 tablet 0  ondansetron (ZOFRAN) 4 MG tablet Take 1 tablet (4 mg total) by mouth every 8 (eight) hours as needed for nausea or vomiting. 20 tablet 0   ondansetron (ZOFRAN) 4 MG tablet TAKE 1 TABLET(4 MG) BY MOUTH EVERY 8 HOURS AS NEEDED FOR NAUSEA OR VOMITING 20 tablet 0   QUEtiapine (SEROQUEL) 300 MG tablet Take 1 tablet (300 mg total) by mouth at bedtime. 30 tablet 0   traZODone (DESYREL) 100 MG tablet TAKE 1 TABLET(100 MG) BY MOUTH AT BEDTIME AS NEEDED FOR SLEEP 30 tablet 1   buPROPion (WELLBUTRIN XL) 300 MG 24 hr tablet Take 1 tablet (300 mg total) by mouth every morning. (Patient not taking: Reported on 12/30/2020) 30 tablet 2   dicyclomine (BENTYL) 10 MG capsule TAKE 1 CAPSULE(10 MG) BY MOUTH FOUR TIMES DAILY AS NEEDED FOR SPASMS (Patient not taking: Reported on 12/30/2020) 30 capsule 0   metroNIDAZOLE (FLAGYL) 500 MG tablet Take 1 tablet (500  mg total) by mouth 2 (two) times daily. (Patient not taking: Reported on 12/30/2020) 14 tablet 0   ondansetron (ZOFRAN) 4 MG tablet TAKE 1 TABLET(4 MG) BY MOUTH EVERY 8 HOURS AS NEEDED FOR NAUSEA OR VOMITING 20 tablet 0   No facility-administered medications prior to visit.    Allergies  Allergen Reactions   Multiple Vitamins-Iron Swelling    States that vitamins and iron have caused swelling of tongue and gums    ROS Review of Systems Per hpi, otherwise negative   Objective:    Physical Exam Vitals and nursing note reviewed.  Constitutional:      General: She is not in acute distress.    Appearance: Normal appearance. She is normal weight. She is not ill-appearing, toxic-appearing or diaphoretic.  Cardiovascular:     Rate and Rhythm: Normal rate and regular rhythm.     Heart sounds: Normal heart sounds. No murmur heard.   No friction rub. No gallop.  Pulmonary:     Effort: Pulmonary effort is normal. No respiratory distress.     Breath sounds: Normal breath sounds. No stridor. No wheezing, rhonchi or rales.  Chest:     Chest wall: No tenderness.  Skin:    General: Skin is warm and dry.  Neurological:     General: No focal deficit present.     Mental Status: She is alert and oriented to person, place, and time. Mental status is at baseline.  Psychiatric:        Mood and Affect: Mood normal.        Behavior: Behavior normal.        Thought Content: Thought content normal.        Judgment: Judgment normal.    Pulse 88   Temp 98.2 F (36.8 C) (Temporal)   LMP 12/15/2020  Wt Readings from Last 3 Encounters:  09/07/20 143 lb 8 oz (65.1 kg)  12/07/19 159 lb (72.1 kg)     There are no preventive care reminders to display for this patient.  There are no preventive care reminders to display for this patient.  Lab Results  Component Value Date   TSH 1.020 01/19/2020   Lab Results  Component Value Date   WBC 10.7 (A) 03/19/2020   HGB 12.2 12/15/2020   HCT 36.0  12/15/2020   MCV 80.2 03/19/2020   PLT 316 03/15/2020   Lab Results  Component Value Date   NA 139 12/15/2020   K 3.6 12/15/2020   CO2 21 03/19/2020   GLUCOSE 97 12/15/2020   BUN  8 12/15/2020   CREATININE 0.80 12/15/2020   BILITOT 0.6 03/19/2020   ALKPHOS 60 03/19/2020   AST 13 03/19/2020   ALT 9 03/19/2020   PROT 7.5 03/19/2020   ALBUMIN 4.7 03/19/2020   CALCIUM 9.9 03/19/2020   ANIONGAP 9 03/15/2020   Lab Results  Component Value Date   CHOL 189 01/19/2020   Lab Results  Component Value Date   HDL 47 01/19/2020   Lab Results  Component Value Date   LDLCALC 122 (H) 01/19/2020   Lab Results  Component Value Date   TRIG 109 01/19/2020   Lab Results  Component Value Date   CHOLHDL 4.0 01/19/2020   Lab Results  Component Value Date   HGBA1C 5.3 01/19/2020      Assessment & Plan:   Problem List Items Addressed This Visit   None Visit Diagnoses     Menstrual migraine without status migrainosus, not intractable    -  Primary   Relevant Medications   propranolol (INDERAL) 20 MG tablet   naratriptan (AMERGE) 1 MG TABS tablet   Other Relevant Orders   Ambulatory referral to Neurology       Meds ordered this encounter  Medications   propranolol (INDERAL) 20 MG tablet    Sig: Take 1 tablet (20 mg total) by mouth daily.    Dispense:  90 tablet    Refill:  1    Order Specific Question:   Supervising Provider    Answer:   Neva Seat, JEFFREY R [[id number]]   naratriptan (AMERGE) 1 MG TABS tablet    Sig: Take 1 tablet (1 mg total) by mouth once as needed for up to 1 dose. Take one (1) tablet at onset of headache; if returns or does not resolve, may repeat after 4 hours; do not exceed five (5) mg in 24 hours.    Dispense:  10 tablet    Refill:  0    Order Specific Question:   Supervising Provider    Answer:   Neva Seat, JEFFREY R [[id number]]    Follow-up: No follow-ups on file.   PLAN Propranolol daily and naratiptan PRN for chronic migraine Refer to neuro for memory  deficit and migraine Return prn Patient encouraged to call clinic with any questions, comments, or concerns.  Janeece Agee, NP

## 2020-12-30 NOTE — Patient Instructions (Addendum)
Ms. Loge -   Good to see you, as always.  I have sent propranolol for daily use. This should help with migraines. Could help with anxiety too. If 1 tablet daily isn't enough feel free to use this twice daily.  I have sent naratriptan. This is for menstrual migraines. It should be used at first sign of menstrual migraine. Repeat in 4 hours if headache persists  Ok to continue naproxen with these medications if it helps  Neuro will call in the coming weeks to get something scheduled.  Thank you  Rich     If you have lab work done today you will be contacted with your lab results within the next 2 weeks.  If you have not heard from [place] then please contact us. The fastest way to get your results is to register for My Chart.   IF you received an x-ray today, you will receive an invoice from [facility]. Please contact [facility] at [phone] with questions or concerns regarding your invoice.   IF you received labwork today, you will receive an invoice from [place]. Please contact [facility] at [phone] with questions or concerns regarding your invoice.   Our billing staff will not be able to assist you with questions regarding bills from these companies.  You will be contacted with the lab results as soon as they are available. The fastest way to get your results is to activate your My Chart account. Instructions are located on the last page of this paperwork. If you have not heard from [place] regarding the results in 2 weeks, please contact this office.

## 2021-01-04 ENCOUNTER — Encounter: Payer: Self-pay | Admitting: Gastroenterology

## 2021-01-04 ENCOUNTER — Other Ambulatory Visit: Payer: Self-pay

## 2021-01-04 ENCOUNTER — Ambulatory Visit (AMBULATORY_SURGERY_CENTER): Payer: 59 | Admitting: Gastroenterology

## 2021-01-04 VITALS — BP 126/65 | HR 78 | Temp 97.8°F | Resp 18 | Ht 64.0 in | Wt 143.0 lb

## 2021-01-04 DIAGNOSIS — K317 Polyp of stomach and duodenum: Secondary | ICD-10-CM

## 2021-01-04 DIAGNOSIS — K2980 Duodenitis without bleeding: Secondary | ICD-10-CM | POA: Diagnosis not present

## 2021-01-04 DIAGNOSIS — R109 Unspecified abdominal pain: Secondary | ICD-10-CM

## 2021-01-04 DIAGNOSIS — K2970 Gastritis, unspecified, without bleeding: Secondary | ICD-10-CM

## 2021-01-04 MED ORDER — SODIUM CHLORIDE 0.9 % IV SOLN: 500.0000 mL | Freq: Once | INTRAVENOUS | Status: DC

## 2021-01-04 NOTE — Patient Instructions (Signed)
YOU HAD AN ENDOSCOPIC PROCEDURE TODAY AT THE [facility]:   Refer to the procedure report that was given to you for any specific questions about what was found during the examination.  If the procedure report does not answer your questions, please call your gastroenterologist to clarify.  If you requested that your care partner not be given the details of your procedure findings, then the procedure report has been included in a sealed envelope for you to review at your convenience later.  YOU SHOULD EXPECT: Some feelings of bloating in the abdomen. Passage of more gas than usual.  Walking can help get rid of the air that was put into your GI tract during the procedure and reduce the bloating. If you had a lower endoscopy (such as a colonoscopy or flexible sigmoidoscopy) you may notice spotting of blood in your stool or on the toilet paper. If you underwent a bowel prep for your procedure, you may not have a normal bowel movement for a few days.  Please Note:  You might notice some irritation and congestion in your nose or some drainage.  This is from the oxygen used during your procedure.  There is no need for concern and it should clear up in a day or so.  SYMPTOMS TO REPORT IMMEDIATELY:    Following upper endoscopy (EGD)  Vomiting of blood or coffee ground material  New chest pain or pain under the shoulder blades  Painful or persistently difficult swallowing  New shortness of breath  Fever of 100F or higher  Black, tarry-looking stools  For urgent or emergent issues, a gastroenterologist can be reached at any hour by calling [phone]. Do not use MyChart messaging for urgent concerns.    DIET:  We do recommend a small meal at first, but then you may proceed to your regular diet.  Drink plenty of fluids but you should avoid alcoholic beverages for 24 hours.  ACTIVITY:  You should plan to take it easy for the rest of today and you should NOT DRIVE or use heavy machinery  until tomorrow (because of the sedation medicines used during the test).    FOLLOW UP: Our staff will call the number listed on your records 48-72 hours following your procedure to check on you and address any questions or concerns that you may have regarding the information given to you following your procedure. If we do not reach you, we will leave a message.  We will attempt to reach you two times.  During this call, we will ask if you have developed any symptoms of COVID 19. If you develop any symptoms (ie: fever, flu-like symptoms, shortness of breath, cough etc.) before then, please call [phone].  If you test positive for Covid 19 in the 2 weeks post procedure, please call and report this information to us.    If any biopsies were taken you will be contacted by phone or by letter within the next 1-3 weeks.  Please call us at [phone] if you have not heard about the biopsies in 3 weeks.    SIGNATURES/CONFIDENTIALITY: You and/or your care partner have signed paperwork which will be entered into your electronic medical record.  These signatures attest to the fact that that the information above on your After Visit Summary has been reviewed and is understood.  Full responsibility of the confidentiality of this discharge information lies with you and/or your care-partner. 

## 2021-01-04 NOTE — Op Note (Signed)
[facility] Patient Name: Kimberly Harper Procedure Date: 01/04/2021 10:51 AM MRN: [id number] Endoscopist: Tressia Danas MD, MD Age: 35 Referring MD:  Date of Birth: 07-Jul-1986 Gender: Female Account #: [id number] Procedure:                Upper GI endoscopy Indications:              Abdominal pain, Anorexia, Nausea Medicines:                Monitored Anesthesia Care Procedure:                Pre-Anesthesia Assessment:                           - Prior to the procedure, a History and Physical                            was performed, and patient medications and                            allergies were reviewed. The patient's tolerance of                            previous anesthesia was also reviewed. The risks                            and benefits of the procedure and the sedation                            options and risks were discussed with the patient.                            All questions were answered, and informed consent                            was obtained. Prior Anticoagulants: The patient has                            taken no previous anticoagulant or antiplatelet                            agents. ASA Grade Assessment: II - A patient with                            mild systemic disease. After reviewing the risks                            and benefits, the patient was deemed in                            satisfactory condition to undergo the procedure.                           After obtaining informed consent, the endoscope was  passed under direct vision. Throughout the                            procedure, the patient's blood pressure, pulse, and                            oxygen saturations were monitored continuously. The                            Endoscope was introduced through the mouth, and                            advanced to the third part of duodenum. The upper                            GI endoscopy was  accomplished without difficulty.                            The patient tolerated the procedure well. Scope In: Scope Out: Findings:                 The examined esophagus was normal. The z-line is                            located 36 cm from the incisors. Biopsies were                            taken from the distal esophagus with a cold forceps                            for histology. Estimated blood loss was minimal.                           The entire examined stomach was normal. Biopsies                            were taken from the antrum, body, and fundus with a                            cold forceps for histology. Estimated blood loss                            was minimal.                           A few small sessile polyps were found in the                            gastric body. Biopsies were taken with a cold                            forceps for histology. Estimated blood loss was  minimal.                           The examined duodenum was normal. Biopsies were                            taken with a cold forceps for histology. Estimated                            blood loss was minimal.                           The cardia and gastric fundus were normal on                            retroflexion. Complications:            No immediate complications. Estimated blood loss:                            Minimal. Estimated Blood Loss:     Estimated blood loss was minimal. Estimated blood                            loss was minimal. Impression:               - Normal endoscopy. Source of symptoms not                            identified on this study. Await biopsy results. Recommendation:           - Patient has a contact number available for                            emergencies. The signs and symptoms of potential                            delayed complications were discussed with the                            patient. Return to normal  activities tomorrow.                            Written discharge instructions were provided to the                            patient.                           - Resume previous diet.                           - Continue present medications.                           - Await pathology results. Tressia Danas MD, MD 01/04/2021 11:23:55 AM This report has been signed electronically.

## 2021-01-04 NOTE — Progress Notes (Signed)
To PACU, VSS. Report to [email or web address] 

## 2021-01-04 NOTE — Progress Notes (Signed)
VS by CW  I have reviewed the patient's medical history in detail and updated the computerized patient record.  

## 2021-01-06 ENCOUNTER — Telehealth: Payer: Self-pay | Admitting: *Deleted

## 2021-01-06 NOTE — Telephone Encounter (Signed)
Left VM with number for patient to call with questions or concerns.

## 2021-01-06 NOTE — Telephone Encounter (Signed)
  Follow up Call-  Call back number 01/04/2021  Post procedure Call Back phone  # [phone]  Permission to leave phone message Yes     Patient questions:  Message left to call us if necessary.

## 2021-01-07 ENCOUNTER — Other Ambulatory Visit: Payer: Self-pay

## 2021-01-07 MED ORDER — OMEPRAZOLE 40 MG PO CPDR: DELAYED_RELEASE_CAPSULE | ORAL | 6 refills | Status: DC

## 2021-01-10 ENCOUNTER — Other Ambulatory Visit: Payer: Self-pay

## 2021-01-10 MED ORDER — DICYCLOMINE HCL 10 MG PO CAPS: 10.0000 mg | ORAL_CAPSULE | Freq: Three times a day (TID) | ORAL | 0 refills | Status: DC

## 2021-01-12 NOTE — Telephone Encounter (Signed)
I recommend that she review these concerns with her primary care provider to determine occupational limitations. Thank you.

## 2021-01-16 ENCOUNTER — Other Ambulatory Visit: Payer: Self-pay | Admitting: Registered Nurse

## 2021-01-16 DIAGNOSIS — R1114 Bilious vomiting: Secondary | ICD-10-CM

## 2021-01-16 DIAGNOSIS — R1084 Generalized abdominal pain: Secondary | ICD-10-CM

## 2021-01-17 ENCOUNTER — Other Ambulatory Visit: Payer: Self-pay

## 2021-01-17 DIAGNOSIS — R1114 Bilious vomiting: Secondary | ICD-10-CM

## 2021-01-17 DIAGNOSIS — R1084 Generalized abdominal pain: Secondary | ICD-10-CM

## 2021-01-17 MED ORDER — ONDANSETRON HCL 4 MG PO TABS: 4.0000 mg | ORAL_TABLET | Freq: Three times a day (TID) | ORAL | 0 refills | Status: DC | PRN

## 2021-01-19 ENCOUNTER — Other Ambulatory Visit (HOSPITAL_COMMUNITY): Payer: Self-pay | Admitting: Psychiatry

## 2021-01-20 ENCOUNTER — Other Ambulatory Visit: Payer: Self-pay

## 2021-01-20 ENCOUNTER — Encounter: Payer: Self-pay | Admitting: Registered Nurse

## 2021-01-20 ENCOUNTER — Ambulatory Visit (INDEPENDENT_AMBULATORY_CARE_PROVIDER_SITE_OTHER): Payer: 59 | Admitting: Registered Nurse

## 2021-01-20 VITALS — BP 109/62 | HR 66 | Temp 98.0°F | Resp 18 | Wt 128.6 lb

## 2021-01-20 DIAGNOSIS — F411 Generalized anxiety disorder: Secondary | ICD-10-CM

## 2021-01-20 DIAGNOSIS — F410 Panic disorder [episodic paroxysmal anxiety] without agoraphobia: Secondary | ICD-10-CM

## 2021-01-20 DIAGNOSIS — R202 Paresthesia of skin: Secondary | ICD-10-CM

## 2021-01-20 DIAGNOSIS — F340 Cyclothymic disorder: Secondary | ICD-10-CM

## 2021-01-20 DIAGNOSIS — G43829 Menstrual migraine, not intractable, without status migrainosus: Secondary | ICD-10-CM | POA: Diagnosis not present

## 2021-01-20 DIAGNOSIS — R109 Unspecified abdominal pain: Secondary | ICD-10-CM | POA: Diagnosis not present

## 2021-01-20 DIAGNOSIS — G8929 Other chronic pain: Secondary | ICD-10-CM

## 2021-01-20 DIAGNOSIS — F603 Borderline personality disorder: Secondary | ICD-10-CM | POA: Diagnosis not present

## 2021-01-20 DIAGNOSIS — F3181 Bipolar II disorder: Secondary | ICD-10-CM

## 2021-01-20 LAB — COMPREHENSIVE METABOLIC PANEL
ALT: 7 U/L (ref 0–35)
AST: 11 U/L (ref 0–37)
Albumin: 4.5 g/dL (ref 3.5–5.2)
Alkaline Phosphatase: 43 U/L (ref 39–117)
BUN: 5 mg/dL — ABNORMAL LOW (ref 6–23)
CO2: 29 mEq/L (ref 19–32)
Calcium: 9.6 mg/dL (ref 8.4–10.5)
Chloride: 104 mEq/L (ref 96–112)
Creatinine, Ser: 0.92 mg/dL (ref 0.40–1.20)
GFR: 79.92 mL/min (ref >60.00)
Glucose, Bld: 83 mg/dL (ref 70–99)
Potassium: 3.8 mEq/L (ref 3.5–5.1)
Sodium: 142 mEq/L (ref 135–145)
Total Bilirubin: 0.4 mg/dL (ref 0.2–1.2)
Total Protein: 7.3 g/dL (ref 6.0–8.3)

## 2021-01-20 LAB — CBC WITH DIFFERENTIAL/PLATELET
Basophils Absolute: 0.1 10*3/uL (ref 0.0–0.1)
Basophils Relative: 0.6 % (ref 0.0–3.0)
Eosinophils Absolute: 0.3 10*3/uL (ref 0.0–0.7)
Eosinophils Relative: 3.1 % (ref 0.0–5.0)
HCT: 36.8 % (ref 36.0–46.0)
Hemoglobin: 12 g/dL (ref 12.0–15.0)
Lymphocytes Relative: 24.6 % (ref 12.0–46.0)
Lymphs Abs: 2.4 10*3/uL (ref 0.7–4.0)
MCHC: 32.7 g/dL (ref 30.0–36.0)
MCV: 80.6 fl (ref 78.0–100.0)
Monocytes Absolute: 0.7 10*3/uL (ref 0.1–1.0)
Monocytes Relative: 6.9 % (ref 3.0–12.0)
Neutro Abs: 6.3 10*3/uL (ref 1.4–7.7)
Neutrophils Relative %: 64.8 % (ref 43.0–77.0)
Platelets: 366 10*3/uL (ref 150.0–400.0)
RBC: 4.56 Mil/uL (ref 3.87–5.11)
RDW: 16.9 % — ABNORMAL HIGH (ref 11.5–15.5)
WBC: 9.7 10*3/uL (ref 4.0–10.5)

## 2021-01-20 LAB — SEDIMENTATION RATE: Sed Rate: 11 mm/hr (ref 0–20)

## 2021-01-20 LAB — TSH: TSH: 0.5 u[IU]/mL (ref 0.35–5.50)

## 2021-01-20 LAB — VITAMIN D 25 HYDROXY (VIT D DEFICIENCY, FRACTURES): VITD: 10.55 ng/mL — ABNORMAL LOW (ref 30.00–100.00)

## 2021-01-20 LAB — C-REACTIVE PROTEIN: CRP: <1 mg/dL (ref 0.5–20.0)

## 2021-01-20 MED ORDER — TRAZODONE HCL 100 MG PO TABS: ORAL_TABLET | ORAL | 0 refills | Status: DC

## 2021-01-20 MED ORDER — ALPRAZOLAM 1 MG PO TABS: 1.0000 mg | ORAL_TABLET | Freq: Three times a day (TID) | ORAL | 0 refills | Status: AC | PRN

## 2021-01-20 MED ORDER — PROPRANOLOL HCL 20 MG PO TABS: 20.0000 mg | ORAL_TABLET | Freq: Every day | ORAL | 0 refills | Status: DC

## 2021-01-20 MED ORDER — QUETIAPINE FUMARATE 300 MG PO TABS: 300.0000 mg | ORAL_TABLET | Freq: Every day | ORAL | 0 refills | Status: DC

## 2021-01-20 MED ORDER — BUPROPION HCL ER (XL) 300 MG PO TB24: 300.0000 mg | ORAL_TABLET | ORAL | 0 refills | Status: DC

## 2021-01-20 MED ORDER — CLONIDINE HCL 0.1 MG PO TABS: 0.1000 mg | ORAL_TABLET | Freq: Three times a day (TID) | ORAL | 3 refills | Status: DC | PRN

## 2021-01-20 NOTE — Patient Instructions (Addendum)
Ms. Hassan -   Sorry you're not feeling well.  In brief : meds will stay the same - add clonidine when you take an alprazolam - this can help it have greater effect. I will refill until we can get you in with new psychiatrist.  Labs today will be back in coming days  Fioricet can be used for headache for next week or two until you see Dr Everlena Cooper  I'll check in with you when labs are back and then again shortly after to make sure things are still going smoothly  Feel free to reach out if you need anything  Thank you  Rich    If you have lab work done today you will be contacted with your lab results within the next 2 weeks.  If you have not heard from [place] then please contact us. The fastest way to get your results is to register for My Chart.   IF you received an x-ray today, you will receive an invoice from [facility]. Please contact [facility] at [phone] with questions or concerns regarding your invoice.   IF you received labwork today, you will receive an invoice from [place]. Please contact [facility] at [phone] with questions or concerns regarding your invoice.   Our billing staff will not be able to assist you with questions regarding bills from these companies.  You will be contacted with the lab results as soon as they are available. The fastest way to get your results is to activate your My Chart account. Instructions are located on the last page of this paperwork. If you have not heard from [place] regarding the results in 2 weeks, please contact this office.

## 2021-01-20 NOTE — Progress Notes (Signed)
Established Patient Office Visit  Subjective:  Patient ID: Kimberly Harper, female    DOB: Sep 27, 1985  Age: 35 y.o. MRN: [id number]  CC:  Chief Complaint  Patient presents with   Hospitalization Follow-up    Patient states she was seen in the hospital for migraines and she is still having migraines and also still having some issues with eating. Patient went from 143 to 128 within the past 2 weeks and she states she hates ow it looks and feel.    HPI Kimberly Harper presents for HFU  Seen on 01/16/21 for abdominal pain Similar to previous chronic abdominal pain Given antiemetics, hyoscyamine, and fluids, stabilized and dc'd  Stomach has been fairly stable since d/c Hunger has been next to none - snacking occasionally, but no full meals Mostly fruits and veggies   Energy has been up and down Mostly feeling tired - feels like this is likely related to migraine pain  Migraine- ongoing since Monday Started back left and then towards R temple, now feeling it diffusely throughout her head and neck Now is feeling altered sensations throughout body - feeling cold / numb  Naratriptan for migraines - ineffective - has not been using. In the past has used fioricet with good effect.  Work absence June 14 first day out Lifting boxes almost all day Does not feel she can go back at this time.   Past Medical History:  Diagnosis Date   Alcoholic (HCC)    Anemia    [facility] 01/17/2020   Anxiety    Asthma    Bipolar 1 disorder (HCC)    Blood transfusion without reported diagnosis    [facility] 01/17/2020   Chronic headaches    Depression    [facility] 01/17/2020   Major depressive disorder    Substance abuse (HCC)    [facility] 01/17/2020    Past Surgical History:  Procedure Laterality Date   CESAREAN SECTION N/A    [facility] 01/17/2020   DILATION AND CURETTAGE OF UTERUS     HAND SURGERY Right    WISDOM TOOTH EXTRACTION      Family History  Problem Relation Age of  Onset   Hypertension Mother    Diabetes Mother    Stroke Mother    Depression Mother    Depression Sister    Irritable bowel syndrome Sister    Migraines Brother    Depression Brother    Stroke Maternal Grandmother    Diabetes Maternal Grandmother    Alcohol abuse Maternal Grandfather    Stroke Paternal Grandmother    Breast cancer Paternal Grandmother    Alcohol abuse Paternal Grandfather     Social History   Socioeconomic History   Marital status: Single    Spouse name: Not on file   Number of children: 1   Years of education: Not on file   Highest education level: Not on file  Occupational History   Occupation: Academic librarian  Tobacco Use   Smoking status: Former    Types: Cigarettes    Quit date: 09/07/2006    Years since quitting: 14.6   Smokeless tobacco: Never  Vaping Use   Vaping Use: Never used  Substance and Sexual Activity   Alcohol use: Yes    Comment: now soically   Drug use: Not Currently    Types: Marijuana   Sexual activity: Yes    Birth control/protection: None  Other Topics Concern   Not on file  Social History Narrative   Right handed  Social Determinants of Health   Financial Resource Strain: Not on file  Food Insecurity: Not on file  Transportation Needs: Not on file  Physical Activity: Not on file  Stress: Not on file  Social Connections: Not on file  Intimate Partner Violence: Not on file    Outpatient Medications Prior to Visit  Medication Sig Dispense Refill   naproxen (NAPROSYN) 500 MG tablet Take 1 tablet (500 mg total) by mouth 2 (two) times daily. (Patient not taking: No sig reported) 30 tablet 0   omeprazole (PRILOSEC) 40 MG capsule Take 1 capsule twice a day for 12 weeks then take 1 capsule daily 60 capsule 6   ALPRAZolam (XANAX) 1 MG tablet Take 1 tablet (1 mg total) by mouth 3 (three) times daily as needed for sleep or anxiety. 90 tablet 0   naratriptan (AMERGE) 1 MG TABS tablet Take 1 tablet (1 mg total) by mouth once  as needed for up to 1 dose. Take one (1) tablet at onset of headache; if returns or does not resolve, may repeat after 4 hours; do not exceed five (5) mg in 24 hours. 10 tablet 0   ondansetron (ZOFRAN) 4 MG tablet Take 1 tablet (4 mg total) by mouth every 8 (eight) hours as needed for nausea or vomiting. 50 tablet 0   propranolol (INDERAL) 20 MG tablet Take 1 tablet (20 mg total) by mouth daily. 90 tablet 1   QUEtiapine (SEROQUEL) 300 MG tablet Take 1 tablet (300 mg total) by mouth at bedtime. 30 tablet 0   traZODone (DESYREL) 100 MG tablet TAKE 1 TABLET(100 MG) BY MOUTH AT BEDTIME AS NEEDED FOR SLEEP 30 tablet 1   buPROPion (WELLBUTRIN XL) 300 MG 24 hr tablet Take 1 tablet (300 mg total) by mouth every morning. (Patient not taking: Reported on 12/30/2020) 30 tablet 2   dicyclomine (BENTYL) 10 MG capsule TAKE 1 CAPSULE(10 MG) BY MOUTH FOUR TIMES DAILY AS NEEDED FOR SPASMS (Patient not taking: Reported on 12/30/2020) 30 capsule 0   dicyclomine (BENTYL) 10 MG capsule Take 1 capsule (10 mg total) by mouth 4 (four) times daily -  before meals and at bedtime. 90 capsule 0   No facility-administered medications prior to visit.    Allergies  Allergen Reactions   Multiple Vitamins-Iron Swelling    States that vitamins and iron have caused swelling of tongue and gums    ROS Review of Systems Per hpi    Objective:    Physical Exam Vitals and nursing note reviewed.  Constitutional:      General: She is not in acute distress.    Appearance: Normal appearance. She is normal weight. She is not ill-appearing, toxic-appearing or diaphoretic.  Cardiovascular:     Rate and Rhythm: Normal rate and regular rhythm.     Heart sounds: Normal heart sounds. No murmur heard.   No friction rub. No gallop.  Pulmonary:     Effort: Pulmonary effort is normal. No respiratory distress.     Breath sounds: Normal breath sounds. No stridor. No wheezing, rhonchi or rales.  Chest:     Chest wall: No tenderness.   Skin:    General: Skin is warm and dry.  Neurological:     General: No focal deficit present.     Mental Status: She is alert and oriented to person, place, and time. Mental status is at baseline.  Psychiatric:        Mood and Affect: Mood normal.  Behavior: Behavior normal.        Thought Content: Thought content normal.        Judgment: Judgment normal.    BP 109/62   Pulse 66   Temp 98 F (36.7 C) (Temporal)   Resp 18   Wt 128 lb 9.6 oz (58.3 kg)   SpO2 99%   BMI 22.07 kg/m  Wt Readings from Last 3 Encounters:  04/27/21 135 lb (61.2 kg)  04/12/21 131 lb 3.2 oz (59.5 kg)  01/20/21 128 lb 9.6 oz (58.3 kg)     Health Maintenance Due  Topic Date Due   COVID-19 Vaccine (1) Never done   PAP SMEAR-Modifier  Never done    There are no preventive care reminders to display for this patient.  Lab Results  Component Value Date   TSH 1.16 04/12/2021   Lab Results  Component Value Date   WBC 7.3 04/12/2021   HGB 12.2 04/12/2021   HCT 37.7 04/12/2021   MCV 80.8 04/12/2021   PLT 277.0 04/12/2021   Lab Results  Component Value Date   NA 139 04/12/2021   K 3.9 04/12/2021   CO2 29 04/12/2021   GLUCOSE 83 04/12/2021   BUN 13 04/12/2021   CREATININE 0.92 04/12/2021   BILITOT 1.0 04/12/2021   ALKPHOS 52 04/12/2021   AST 13 04/12/2021   ALT 10 04/12/2021   PROT 7.6 04/12/2021   ALBUMIN 4.4 04/12/2021   CALCIUM 9.7 04/12/2021   ANIONGAP 9 03/15/2020   GFR 79.80 04/12/2021   Lab Results  Component Value Date   CHOL 189 01/19/2020   Lab Results  Component Value Date   HDL 47 01/19/2020   Lab Results  Component Value Date   LDLCALC 122 (H) 01/19/2020   Lab Results  Component Value Date   TRIG 109 01/19/2020   Lab Results  Component Value Date   CHOLHDL 4.0 01/19/2020   Lab Results  Component Value Date   HGBA1C 5.3 01/19/2020      Assessment & Plan:   Problem List Items Addressed This Visit       Other   Bipolar 2 disorder, major  depressive episode (HCC) - Primary   Relevant Medications   traZODone (DESYREL) 100 MG tablet   Other Relevant Orders   Ambulatory referral to Psychiatry   Cyclothymic disorder   Relevant Orders   Ambulatory referral to Psychiatry   Borderline personality disorder (HCC)   Relevant Orders   Ambulatory referral to Psychiatry   GAD (generalized anxiety disorder)   Relevant Medications   traZODone (DESYREL) 100 MG tablet   Other Relevant Orders   Ambulatory referral to Psychiatry   Panic disorder   Relevant Medications   traZODone (DESYREL) 100 MG tablet   Other Relevant Orders   Ambulatory referral to Psychiatry   Other Visit Diagnoses     Menstrual migraine without status migrainosus, not intractable       Relevant Medications   traZODone (DESYREL) 100 MG tablet   Chronic abdominal pain       Relevant Medications   traZODone (DESYREL) 100 MG tablet   Other Relevant Orders   CBC with Differential/Platelet (Completed)   Comprehensive metabolic panel (Completed)   TSH (Completed)   Vitamin D (25 hydroxy) (Completed)   Sedimentation rate (Completed)   C-reactive protein (Completed)   Paresthesia       Relevant Orders   TSH (Completed)   Vitamin D (25 hydroxy) (Completed)       Meds ordered this  encounter  Medications   DISCONTD: QUEtiapine (SEROQUEL) 300 MG tablet    Sig: Take 1 tablet (300 mg total) by mouth at bedtime.    Dispense:  90 tablet    Refill:  0    Order Specific Question:   Supervising Provider    Answer:   Neva Seat, JEFFREY R [[id number]]   DISCONTD: buPROPion (WELLBUTRIN XL) 300 MG 24 hr tablet    Sig: Take 1 tablet (300 mg total) by mouth every morning.    Dispense:  90 tablet    Refill:  0    Order Specific Question:   Supervising Provider    Answer:   Neva Seat, JEFFREY R [[id number]]   DISCONTD: propranolol (INDERAL) 20 MG tablet    Sig: Take 1 tablet (20 mg total) by mouth daily.    Dispense:  90 tablet    Refill:  0    Order Specific Question:    Supervising Provider    Answer:   Neva Seat, JEFFREY R [[id number]]   traZODone (DESYREL) 100 MG tablet    Sig: TAKE 1 TABLET(100 MG) BY MOUTH AT BEDTIME AS NEEDED FOR SLEEP    Dispense:  90 tablet    Refill:  0    Order Specific Question:   Supervising Provider    Answer:   Neva Seat, JEFFREY R [[id number]]   ALPRAZolam (XANAX) 1 MG tablet    Sig: Take 1 tablet (1 mg total) by mouth 3 (three) times daily as needed for sleep or anxiety.    Dispense:  90 tablet    Refill:  0    No early refill. Thirty days bridge supply by Covering MD. Patient must established care with new provider.    Order Specific Question:   Supervising Provider    Answer:   Neva Seat, JEFFREY R [[id number]]   DISCONTD: cloNIDine (CATAPRES) 0.1 MG tablet    Sig: Take 1 tablet (0.1 mg total) by mouth 3 (three) times daily as needed (with alprazolam).    Dispense:  90 tablet    Refill:  3    Order Specific Question:   Supervising Provider    Answer:   Neva Seat, JEFFREY R [[id number]]    Follow-up: Return if symptoms worsen or fail to improve.   PLAN Refill trazodone as above Refer to psychiatry for ongoing management of mental health. Labs collected. Will follow up with the patient as warranted. Patient encouraged to call clinic with any questions, comments, or concerns.  Janeece Agee, NP

## 2021-01-25 ENCOUNTER — Other Ambulatory Visit: Payer: Self-pay | Admitting: Registered Nurse

## 2021-01-25 DIAGNOSIS — E559 Vitamin D deficiency, unspecified: Secondary | ICD-10-CM

## 2021-01-25 MED ORDER — VITAMIN D (ERGOCALCIFEROL) 1.25 MG (50000 UNIT) PO CAPS: 50000.0000 [IU] | ORAL_CAPSULE | ORAL | 0 refills | Status: DC

## 2021-02-02 NOTE — Progress Notes (Deleted)
NEUROLOGY CONSULTATION NOTE  Kimberly Harper MRN: [id number] DOB: 1986/05/19  Referring provider: Janeece Agee, NP Primary care provider: Janeece Agee, NP  Reason for consult:  migraines  Assessment/Plan:   ***   Subjective:  Kimberly Harper is a 35 year old ***-handed female with Bipolar 1 disorder, depression/anxiety, asthma and history of alcohol and substance abuse who presents for migraines.  History supplemented by PCP's note.  Onset:  *** Location:  *** Quality:  *** Intensity:  ***.  *** denies new headache, thunderclap headache or severe headache that wakes *** from sleep. Aura:  *** Prodrome:  *** Postdrome:  *** Associated symptoms:  ***.  *** denies associated unilateral numbness or weakness. Duration:  *** Frequency:  *** Frequency of abortive medication: *** Triggers:  *** Relieving factors:  *** Activity:  ***  Current NSAIDS/analgesics:  naproxen 500mg  Current triptans:  none Current ergotamine:  none Current anti-emetic:  Zofran 4mg  Current muscle relaxants:  none Current Antihypertensive medications:  propranolol 20mg  QD, clonidine Current Antidepressant medications:  Wellbutrin, trazodone 100mg  QHS Current Anticonvulsant medications:  none Current anti-CGRP:  none Current Vitamins/Herbal/Supplements:  D Current Antihistamines/Decongestants:  none Other therapy:  none Hormone/birth control:  none Other medications:  alprazolam, Seroquel 300mg  QHS  Past NSAIDS/analgesics:  Excedrin Migraine Past abortive triptans:  Amerge Past abortive ergotamine:  none Past muscle relaxants:  none Past anti-emetic:  none Past antihypertensive medications:  none Past antidepressant medications:  Remeron Past anticonvulsant medications:  topiramate, gabapentin Past anti-CGRP:  none Past vitamins/Herbal/Supplements:  none Past antihistamines/decongestants:  Flonase Other past therapies:  ***  Caffeine:  *** Alcohol:  *** Smoker:  *** Diet:   *** Exercise:  *** Depression:  ***; Anxiety:  *** Other pain:  *** Sleep hygiene:  *** Family history of headache:  ***   Labs from June unremarkable (CBC, CMP, sed rate 11, CRP negative) except for low vit D of 10.   PAST MEDICAL HISTORY: Past Medical History:  Diagnosis Date   Alcoholic (HCC)    Anemia    [facility] 01/17/2020   Anxiety    Asthma    Bipolar 1 disorder (HCC)    Blood transfusion without reported diagnosis    [facility] 01/17/2020   Chronic headaches    Depression    [facility] 01/17/2020   Major depressive disorder    Substance abuse (HCC)    [facility] 01/17/2020    PAST SURGICAL HISTORY: Past Surgical History:  Procedure Laterality Date   CESAREAN SECTION N/A    [facility] 01/17/2020   DILATION AND CURETTAGE OF UTERUS     HAND SURGERY Right    WISDOM TOOTH EXTRACTION      MEDICATIONS: Current Outpatient Medications on File Prior to Visit  Medication Sig Dispense Refill   Vitamin D, Ergocalciferol, (DRISDOL) 1.25 MG (50000 UNIT) CAPS capsule Take 1 capsule (50,000 Units total) by mouth every 7 (seven) days. 12 capsule 0   ALPRAZolam (XANAX) 1 MG tablet Take 1 tablet (1 mg total) by mouth 3 (three) times daily as needed for sleep or anxiety. 90 tablet 0   buPROPion (WELLBUTRIN XL) 300 MG 24 hr tablet Take 1 tablet (300 mg total) by mouth every morning. 90 tablet 0   cloNIDine (CATAPRES) 0.1 MG tablet Take 1 tablet (0.1 mg total) by mouth 3 (three) times daily as needed (with alprazolam). 90 tablet 3   naproxen (NAPROSYN) 500 MG tablet Take 1 tablet (500 mg total) by mouth 2 (two) times daily. 30 tablet 0   omeprazole (  PRILOSEC) 40 MG capsule Take 1 capsule twice a day for 12 weeks then take 1 capsule daily 60 capsule 6   ondansetron (ZOFRAN) 4 MG tablet Take 1 tablet (4 mg total) by mouth every 8 (eight) hours as needed for nausea or vomiting. 50 tablet 0   propranolol (INDERAL) 20 MG tablet Take 1 tablet (20 mg total) by mouth daily. 90 tablet 0    QUEtiapine (SEROQUEL) 300 MG tablet Take 1 tablet (300 mg total) by mouth at bedtime. 90 tablet 0   traZODone (DESYREL) 100 MG tablet TAKE 1 TABLET(100 MG) BY MOUTH AT BEDTIME AS NEEDED FOR SLEEP 90 tablet 0   [DISCONTINUED] montelukast (SINGULAIR) 10 MG tablet Take 1 tablet (10 mg total) by mouth at bedtime. 30 tablet 3   No current facility-administered medications on file prior to visit.    ALLERGIES: Allergies  Allergen Reactions   Multiple Vitamins-Iron Swelling    States that vitamins and iron have caused swelling of tongue and gums    FAMILY HISTORY: Family History  Problem Relation Age of Onset   Hypertension Mother    Diabetes Mother    Stroke Mother    Depression Mother    Depression Sister    Irritable bowel syndrome Sister    Depression Brother    Alcohol abuse Maternal Grandfather    Alcohol abuse Paternal Grandfather    Diabetes Maternal Grandmother    Breast cancer Paternal Grandmother     Objective:  *** General: No acute distress.  Patient appears well-groomed.   Head:  Normocephalic/atraumatic Eyes:  fundi examined but not visualized Neck: supple, no paraspinal tenderness, full range of motion Back: No paraspinal tenderness Heart: regular rate and rhythm Lungs: Clear to auscultation bilaterally. Vascular: No carotid bruits. Neurological Exam: Mental status: alert and oriented to person, place, and time, recent and remote memory intact, fund of knowledge intact, attention and concentration intact, speech fluent and not dysarthric, language intact. Cranial nerves: CN I: not tested CN II: pupils equal, round and reactive to light, visual fields intact CN III, IV, VI:  full range of motion, no nystagmus, no ptosis CN V: facial sensation intact. CN VII: upper and lower face symmetric CN VIII: hearing intact CN IX, X: gag intact, uvula midline CN XI: sternocleidomastoid and trapezius muscles intact CN XII: tongue midline Bulk & Tone: normal, no  fasciculations. Motor:  muscle strength 5/5 throughout Sensation:  Pinprick, temperature and vibratory sensation intact. Deep Tendon Reflexes:  2+ throughout,  toes downgoing.   Finger to nose testing:  Without dysmetria.   Heel to shin:  Without dysmetria.   Gait:  Normal station and stride.  Romberg negative.    Thank you for allowing me to take part in the care of this patient.  Shon Millet, DO  CC: ***

## 2021-02-04 ENCOUNTER — Ambulatory Visit: Payer: 59 | Admitting: Neurology

## 2021-03-10 ENCOUNTER — Other Ambulatory Visit: Payer: Self-pay | Admitting: Gastroenterology

## 2021-03-10 DIAGNOSIS — R1084 Generalized abdominal pain: Secondary | ICD-10-CM

## 2021-03-10 DIAGNOSIS — R1114 Bilious vomiting: Secondary | ICD-10-CM

## 2021-04-12 ENCOUNTER — Other Ambulatory Visit: Payer: Self-pay | Admitting: Registered Nurse

## 2021-04-12 ENCOUNTER — Other Ambulatory Visit: Payer: Self-pay

## 2021-04-12 ENCOUNTER — Ambulatory Visit (INDEPENDENT_AMBULATORY_CARE_PROVIDER_SITE_OTHER): Payer: Self-pay | Admitting: Registered Nurse

## 2021-04-12 ENCOUNTER — Encounter: Payer: Self-pay | Admitting: Registered Nurse

## 2021-04-12 VITALS — BP 99/61 | HR 64 | Temp 98.3°F | Resp 18 | Ht 64.0 in | Wt 131.2 lb

## 2021-04-12 DIAGNOSIS — F411 Generalized anxiety disorder: Secondary | ICD-10-CM

## 2021-04-12 DIAGNOSIS — E559 Vitamin D deficiency, unspecified: Secondary | ICD-10-CM

## 2021-04-12 DIAGNOSIS — R6881 Early satiety: Secondary | ICD-10-CM

## 2021-04-12 DIAGNOSIS — R1114 Bilious vomiting: Secondary | ICD-10-CM

## 2021-04-12 DIAGNOSIS — R1084 Generalized abdominal pain: Secondary | ICD-10-CM

## 2021-04-12 DIAGNOSIS — F410 Panic disorder [episodic paroxysmal anxiety] without agoraphobia: Secondary | ICD-10-CM

## 2021-04-12 DIAGNOSIS — G43829 Menstrual migraine, not intractable, without status migrainosus: Secondary | ICD-10-CM

## 2021-04-12 DIAGNOSIS — Z7251 High risk heterosexual behavior: Secondary | ICD-10-CM

## 2021-04-12 LAB — CBC WITH DIFFERENTIAL/PLATELET
Basophils Absolute: 0.1 10*3/uL (ref 0.0–0.1)
Basophils Relative: 0.7 % (ref 0.0–3.0)
Eosinophils Absolute: 0.2 10*3/uL (ref 0.0–0.7)
Eosinophils Relative: 3.3 % (ref 0.0–5.0)
HCT: 37.7 % (ref 36.0–46.0)
Hemoglobin: 12.2 g/dL (ref 12.0–15.0)
Lymphocytes Relative: 31.2 % (ref 12.0–46.0)
Lymphs Abs: 2.3 10*3/uL (ref 0.7–4.0)
MCHC: 32.3 g/dL (ref 30.0–36.0)
MCV: 80.8 fl (ref 78.0–100.0)
Monocytes Absolute: 0.6 10*3/uL (ref 0.1–1.0)
Monocytes Relative: 8.2 % (ref 3.0–12.0)
Neutro Abs: 4.1 10*3/uL (ref 1.4–7.7)
Neutrophils Relative %: 56.6 % (ref 43.0–77.0)
Platelets: 277 10*3/uL (ref 150.0–400.0)
RBC: 4.67 Mil/uL (ref 3.87–5.11)
RDW: 14.1 % (ref 11.5–15.5)
WBC: 7.3 10*3/uL (ref 4.0–10.5)

## 2021-04-12 LAB — COMPREHENSIVE METABOLIC PANEL
ALT: 10 U/L (ref 0–35)
AST: 13 U/L (ref 0–37)
Albumin: 4.4 g/dL (ref 3.5–5.2)
Alkaline Phosphatase: 52 U/L (ref 39–117)
BUN: 13 mg/dL (ref 6–23)
CO2: 29 mEq/L (ref 19–32)
Calcium: 9.7 mg/dL (ref 8.4–10.5)
Chloride: 100 mEq/L (ref 96–112)
Creatinine, Ser: 0.92 mg/dL (ref 0.40–1.20)
GFR: 79.8 mL/min (ref >60.00)
Glucose, Bld: 83 mg/dL (ref 70–99)
Potassium: 3.9 mEq/L (ref 3.5–5.1)
Sodium: 139 mEq/L (ref 135–145)
Total Bilirubin: 1 mg/dL (ref 0.2–1.2)
Total Protein: 7.6 g/dL (ref 6.0–8.3)

## 2021-04-12 LAB — VITAMIN D 25 HYDROXY (VIT D DEFICIENCY, FRACTURES): VITD: 41.38 ng/mL (ref 30.00–100.00)

## 2021-04-12 LAB — POCT URINE PREGNANCY: Preg Test, Ur: NEGATIVE

## 2021-04-12 LAB — TSH: TSH: 1.16 u[IU]/mL (ref 0.35–5.50)

## 2021-04-12 NOTE — Patient Instructions (Signed)
° ° ° °  If you have lab work done today you will be contacted with your lab results within the next 2 weeks.  If you have not heard from us then please contact us. The fastest way to get your results is to register for My Chart. ° ° °IF you received an x-ray today, you will receive an invoice from [facility]. Please contact  Radiology at [phone] with questions or concerns regarding your invoice.  ° °IF you received labwork today, you will receive an invoice from [facility]. Please contact [facility] at [phone] with questions or concerns regarding your invoice.  ° °Our billing staff will not be able to assist you with questions regarding bills from these companies. ° °You will be contacted with the lab results as soon as they are available. The fastest way to get your results is to activate your My Chart account. Instructions are located on the last page of this paperwork. If you have not heard from us regarding the results in 2 weeks, please contact this office. °  ° ° ° °

## 2021-04-12 NOTE — Progress Notes (Signed)
Established Patient Office Visit  Subjective:  Patient ID: Kimberly Harper, female    DOB: 05/10/1986  Age: 35 y.o. MRN: [id number]  CC:  Chief Complaint  Patient presents with   Follow-up    Follow up for stomach issues and patient has also been exposed to mold at home    HPI Kimberly Harper presents for follow up   Stomach pain Particularly foul smelling stool On and off pain - cramping, discomfort with small portion sizes Loose stools more often - soft, not watery. No hx of chronic GI infection Metronidazole in the past year,   Vitamin D deficiency Supplemented after last visit Feeling better. Wants to recheck   Mold exposure Nausea on waking  Onset Aug 29 Vomiting twice with this nausea LMP: 03/29/21, normal quality and consistency Currently sexually active  Med refills As noted. No complaints Good effect, no AE  Past Medical History:  Diagnosis Date   Alcoholic (HCC)    Anemia    [facility] 01/17/2020   Anxiety    Asthma    Bipolar 1 disorder (HCC)    Blood transfusion without reported diagnosis    [facility] 01/17/2020   Chronic headaches    Depression    [facility] 01/17/2020   Major depressive disorder    Substance abuse (HCC)    [facility] 01/17/2020    Past Surgical History:  Procedure Laterality Date   CESAREAN SECTION N/A    [facility] 01/17/2020   DILATION AND CURETTAGE OF UTERUS     HAND SURGERY Right    WISDOM TOOTH EXTRACTION      Family History  Problem Relation Age of Onset   Hypertension Mother    Diabetes Mother    Stroke Mother    Depression Mother    Depression Sister    Irritable bowel syndrome Sister    Depression Brother    Alcohol abuse Maternal Grandfather    Alcohol abuse Paternal Grandfather    Diabetes Maternal Grandmother    Breast cancer Paternal Grandmother     Social History   Socioeconomic History   Marital status: Single    Spouse name: Not on file   Number of children: 1   Years of education:  Not on file   Highest education level: Not on file  Occupational History   Occupation: Academic librarian  Tobacco Use   Smoking status: Former    Types: Cigarettes    Quit date: 09/07/2006    Years since quitting: 14.6   Smokeless tobacco: Never  Vaping Use   Vaping Use: Never used  Substance and Sexual Activity   Alcohol use: Yes    Comment: socially and self destructively   Drug use: Not Currently    Types: Marijuana   Sexual activity: Yes    Birth control/protection: None  Other Topics Concern   Not on file  Social History Narrative   Not on file   Social Determinants of Health   Financial Resource Strain: Not on file  Food Insecurity: Not on file  Transportation Needs: Not on file  Physical Activity: Not on file  Stress: Not on file  Social Connections: Not on file  Intimate Partner Violence: Not on file    Outpatient Medications Prior to Visit  Medication Sig Dispense Refill   ALPRAZolam (XANAX) 1 MG tablet Take 1 tablet (1 mg total) by mouth 3 (three) times daily as needed for sleep or anxiety. 90 tablet 0   omeprazole (PRILOSEC) 40 MG capsule Take 1 capsule twice  a day for 12 weeks then take 1 capsule daily 60 capsule 6   ondansetron (ZOFRAN) 4 MG tablet Take 1 tablet (4 mg total) by mouth every 8 (eight) hours as needed for nausea or vomiting. 50 tablet 0   propranolol (INDERAL) 20 MG tablet Take 1 tablet (20 mg total) by mouth daily. 90 tablet 0   Vitamin D, Ergocalciferol, (DRISDOL) 1.25 MG (50000 UNIT) CAPS capsule Take 1 capsule (50,000 Units total) by mouth every 7 (seven) days. 12 capsule 0   buPROPion (WELLBUTRIN XL) 300 MG 24 hr tablet Take 1 tablet (300 mg total) by mouth every morning. (Patient not taking: Reported on 04/12/2021) 90 tablet 0   cloNIDine (CATAPRES) 0.1 MG tablet Take 1 tablet (0.1 mg total) by mouth 3 (three) times daily as needed (with alprazolam). 90 tablet 3   naproxen (NAPROSYN) 500 MG tablet Take 1 tablet (500 mg total) by mouth 2 (two)  times daily. (Patient not taking: Reported on 04/12/2021) 30 tablet 0   QUEtiapine (SEROQUEL) 300 MG tablet Take 1 tablet (300 mg total) by mouth at bedtime. (Patient not taking: Reported on 04/12/2021) 90 tablet 0   traZODone (DESYREL) 100 MG tablet TAKE 1 TABLET(100 MG) BY MOUTH AT BEDTIME AS NEEDED FOR SLEEP (Patient not taking: Reported on 04/12/2021) 90 tablet 0   No facility-administered medications prior to visit.    Allergies  Allergen Reactions   Multiple Vitamins-Iron Swelling    States that vitamins and iron have caused swelling of tongue and gums    ROS Review of Systems  Constitutional: Negative.   HENT: Negative.    Eyes: Negative.   Respiratory: Negative.    Cardiovascular: Negative.   Gastrointestinal:  Positive for diarrhea, nausea and vomiting. Negative for abdominal distention, abdominal pain, anal bleeding, blood in stool, constipation and rectal pain.  Endocrine: Negative.   Genitourinary: Negative.   Musculoskeletal: Negative.   Skin: Negative.   Allergic/Immunologic: Negative.   Neurological: Negative.   Hematological: Negative.   Psychiatric/Behavioral: Negative.    All other systems reviewed and are negative.    Objective:    Physical Exam Vitals and nursing note reviewed.  Constitutional:      General: She is not in acute distress.    Appearance: Normal appearance. She is normal weight. She is not ill-appearing, toxic-appearing or diaphoretic.  Cardiovascular:     Rate and Rhythm: Normal rate and regular rhythm.     Heart sounds: Normal heart sounds. No murmur heard.   No friction rub. No gallop.  Pulmonary:     Effort: Pulmonary effort is normal. No respiratory distress.     Breath sounds: Normal breath sounds. No stridor. No wheezing, rhonchi or rales.  Chest:     Chest wall: No tenderness.  Abdominal:     General: Abdomen is flat. Bowel sounds are normal.     Palpations: Abdomen is soft.  Skin:    General: Skin is warm and dry.   Neurological:     General: No focal deficit present.     Mental Status: She is alert and oriented to person, place, and time. Mental status is at baseline.  Psychiatric:        Mood and Affect: Mood normal.        Behavior: Behavior normal.        Thought Content: Thought content normal.        Judgment: Judgment normal.    BP 99/61   Pulse 64   Temp 98.3 F (36.8 C) (  Temporal)   Resp 18   Ht 5\' 4"  (1.626 m)   Wt 131 lb 3.2 oz (59.5 kg)   SpO2 99%   BMI 22.52 kg/m  Wt Readings from Last 3 Encounters:  04/12/21 131 lb 3.2 oz (59.5 kg)  01/20/21 128 lb 9.6 oz (58.3 kg)  01/04/21 143 lb (64.9 kg)     There are no preventive care reminders to display for this patient.  There are no preventive care reminders to display for this patient.  Lab Results  Component Value Date   TSH 0.50 01/20/2021   Lab Results  Component Value Date   WBC 9.7 01/20/2021   HGB 12.0 01/20/2021   HCT 36.8 01/20/2021   MCV 80.6 01/20/2021   PLT 366.0 01/20/2021   Lab Results  Component Value Date   NA 142 01/20/2021   K 3.8 01/20/2021   CO2 29 01/20/2021   GLUCOSE 83 01/20/2021   BUN 5 (L) 01/20/2021   CREATININE 0.92 01/20/2021   BILITOT 0.4 01/20/2021   ALKPHOS 43 01/20/2021   AST 11 01/20/2021   ALT 7 01/20/2021   PROT 7.3 01/20/2021   ALBUMIN 4.5 01/20/2021   CALCIUM 9.6 01/20/2021   ANIONGAP 9 03/15/2020   GFR 79.92 01/20/2021   Lab Results  Component Value Date   CHOL 189 01/19/2020   Lab Results  Component Value Date   HDL 47 01/19/2020   Lab Results  Component Value Date   LDLCALC 122 (H) 01/19/2020   Lab Results  Component Value Date   TRIG 109 01/19/2020   Lab Results  Component Value Date   CHOLHDL 4.0 01/19/2020   Lab Results  Component Value Date   HGBA1C 5.3 01/19/2020      Assessment & Plan:   Problem List Items Addressed This Visit   None Visit Diagnoses     Early satiety    -  Primary   Relevant Orders   CBC with Differential/Platelet    Comprehensive metabolic panel   TSH   Ambulatory referral to Gastroenterology   Stool Culture   Generalized abdominal pain       Relevant Orders   CBC with Differential/Platelet   Comprehensive metabolic panel   TSH   Ambulatory referral to Gastroenterology   Stool Culture   Bilious vomiting with nausea       Relevant Orders   Ambulatory referral to Gastroenterology   POCT Pregnancy, Urine   Unprotected sexual intercourse       Relevant Orders   POCT Pregnancy, Urine   POCT urine pregnancy (Completed)   Vitamin D deficiency       Relevant Orders   Vitamin D (25 hydroxy)       No orders of the defined types were placed in this encounter.   Follow-up: No follow-ups on file.   PLAN Stool culture collected, refer to GI for ongoing abdominal pain POCT pregnancy test collected - negative Labs collected. Will follow up with the patient as warranted. Patient encouraged to call clinic with any questions, comments, or concerns.  01/21/2020, NP

## 2021-04-13 ENCOUNTER — Encounter: Payer: Self-pay | Admitting: Registered Nurse

## 2021-04-13 ENCOUNTER — Other Ambulatory Visit: Payer: Self-pay

## 2021-04-13 DIAGNOSIS — F411 Generalized anxiety disorder: Secondary | ICD-10-CM

## 2021-04-13 DIAGNOSIS — G43829 Menstrual migraine, not intractable, without status migrainosus: Secondary | ICD-10-CM

## 2021-04-13 DIAGNOSIS — F410 Panic disorder [episodic paroxysmal anxiety] without agoraphobia: Secondary | ICD-10-CM

## 2021-04-13 MED ORDER — ONDANSETRON HCL 4 MG PO TABS: 4.0000 mg | ORAL_TABLET | Freq: Three times a day (TID) | ORAL | 0 refills | Status: DC | PRN

## 2021-04-13 MED ORDER — PROPRANOLOL HCL 20 MG PO TABS
20.0000 mg | ORAL_TABLET | Freq: Every day | ORAL | 0 refills | Status: DC
Start: 2021-04-13 — End: 2021-07-13

## 2021-04-13 NOTE — Telephone Encounter (Signed)
Patient is requesting a refill of the following medications: Requested Prescriptions   Pending Prescriptions Disp Refills   ondansetron (ZOFRAN) 4 MG tablet 50 tablet 0    Sig: Take 1 tablet (4 mg total) by mouth every 8 (eight) hours as needed for nausea or vomiting.    Date of patient request: 04/13/2021 Last office visit: 04/12/2021 Date of last refill: 01/17/2021 Last refill amount: 50 tablets Follow up time period per chart:

## 2021-04-13 NOTE — Telephone Encounter (Signed)
Requesting:Xanax 1mg  Contract: UDS: Last Visit:04/12/21 Next Visit:n/a Last Refill:01/20/21 90 tabs 0 refills  Please Advise

## 2021-04-16 ENCOUNTER — Other Ambulatory Visit: Payer: Self-pay | Admitting: Registered Nurse

## 2021-04-16 ENCOUNTER — Encounter: Payer: Self-pay | Admitting: Registered Nurse

## 2021-04-16 DIAGNOSIS — E559 Vitamin D deficiency, unspecified: Secondary | ICD-10-CM

## 2021-04-19 ENCOUNTER — Ambulatory Visit (INDEPENDENT_AMBULATORY_CARE_PROVIDER_SITE_OTHER): Payer: Self-pay

## 2021-04-19 ENCOUNTER — Other Ambulatory Visit: Payer: Self-pay

## 2021-04-19 ENCOUNTER — Encounter: Payer: Self-pay | Admitting: Emergency Medicine

## 2021-04-19 ENCOUNTER — Ambulatory Visit
Admission: EM | Admit: 2021-04-19 | Discharge: 2021-04-19 | Disposition: A | Discharge status: Home / Self Care | Payer: Self-pay | Attending: Urgent Care | Admitting: Urgent Care

## 2021-04-19 DIAGNOSIS — R42 Dizziness and giddiness: Secondary | ICD-10-CM

## 2021-04-19 DIAGNOSIS — J3089 Other allergic rhinitis: Secondary | ICD-10-CM

## 2021-04-19 DIAGNOSIS — J3489 Other specified disorders of nose and nasal sinuses: Secondary | ICD-10-CM

## 2021-04-19 DIAGNOSIS — R059 Cough, unspecified: Secondary | ICD-10-CM

## 2021-04-19 MED ORDER — LEVOCETIRIZINE DIHYDROCHLORIDE 5 MG PO TABS: 5.0000 mg | ORAL_TABLET | Freq: Every evening | ORAL | 0 refills | Status: DC

## 2021-04-19 MED ORDER — FLUTICASONE PROPIONATE 50 MCG/ACT NA SUSP: 2.0000 | Freq: Every day | NASAL | 0 refills | Status: DC

## 2021-04-19 MED ORDER — PSEUDOEPHEDRINE HCL 30 MG PO TABS: 30.0000 mg | ORAL_TABLET | Freq: Three times a day (TID) | ORAL | 0 refills | Status: DC | PRN

## 2021-04-19 NOTE — ED Provider Notes (Signed)
[facility]   MRN: [id number] DOB: [birth year]  Subjective:   Kimberly Harper is a 35 y.o. female presenting for 1 month history of persistent intermittent dizziness, coughing, nausea, vomiting, head/sinus pressure.  Symptoms are worse at night.  She was just found to have mold at home in her apartment.  Management is supposed to work on at this week.  Patient had an office visit with her regular care doctor 1 week ago.  Had extensive labs done.  Recommendation was to use allergy medications which she did not start.  Patient does have a history of this and asthma.  No confusion, weakness, neck pain, stiffness, numbness or tingling, vision changes, chest pain, shortness of breath.  However, she does endorse that she started to feel some rattling in the left lower side of her lungs.  Patient is not a smoker.  No current facility-administered medications for this encounter.  Current Outpatient Medications:    ALPRAZolam (XANAX) 1 MG tablet, Take 1 tablet (1 mg total) by mouth 3 (three) times daily as needed for sleep or anxiety., Disp: 90 tablet, Rfl: 0   buPROPion (WELLBUTRIN XL) 300 MG 24 hr tablet, Take 1 tablet (300 mg total) by mouth every morning. (Patient not taking: Reported on 04/12/2021), Disp: 90 tablet, Rfl: 0   cloNIDine (CATAPRES) 0.1 MG tablet, Take 1 tablet (0.1 mg total) by mouth 3 (three) times daily as needed (with alprazolam)., Disp: 90 tablet, Rfl: 3   naproxen (NAPROSYN) 500 MG tablet, Take 1 tablet (500 mg total) by mouth 2 (two) times daily. (Patient not taking: Reported on 04/12/2021), Disp: 30 tablet, Rfl: 0   omeprazole (PRILOSEC) 40 MG capsule, Take 1 capsule twice a day for 12 weeks then take 1 capsule daily, Disp: 60 capsule, Rfl: 6   ondansetron (ZOFRAN) 4 MG tablet, Take 1 tablet (4 mg total) by mouth every 8 (eight) hours as needed for nausea or vomiting., Disp: 50 tablet, Rfl: 0   propranolol (INDERAL) 20 MG tablet, Take 1 tablet (20 mg total) by  mouth daily., Disp: 90 tablet, Rfl: 0   QUEtiapine (SEROQUEL) 300 MG tablet, Take 1 tablet (300 mg total) by mouth at bedtime. (Patient not taking: Reported on 04/12/2021), Disp: 90 tablet, Rfl: 0   traZODone (DESYREL) 100 MG tablet, TAKE 1 TABLET(100 MG) BY MOUTH AT BEDTIME AS NEEDED FOR SLEEP (Patient not taking: Reported on 04/12/2021), Disp: 90 tablet, Rfl: 0   Vitamin D, Ergocalciferol, (DRISDOL) 1.25 MG (50000 UNIT) CAPS capsule, TAKE 1 CAPSULE BY MOUTH EVERY 7 DAYS, Disp: 12 capsule, Rfl: 0   Allergies  Allergen Reactions   Multiple Vitamins-Iron Swelling    States that vitamins and iron have caused swelling of tongue and gums    Past Medical History:  Diagnosis Date   Alcoholic (HCC)    Anemia    [facility] 01/17/2020   Anxiety    Asthma    Bipolar 1 disorder (HCC)    Blood transfusion without reported diagnosis    [facility] 01/17/2020   Chronic headaches    Depression    [facility] 01/17/2020   Major depressive disorder    Substance abuse (HCC)    [facility] 01/17/2020     Past Surgical History:  Procedure Laterality Date   CESAREAN SECTION N/A    [facility] 01/17/2020   DILATION AND CURETTAGE OF UTERUS     HAND SURGERY Right    WISDOM TOOTH EXTRACTION      Family History  Problem Relation Age of Onset  Hypertension Mother    Diabetes Mother    Stroke Mother    Depression Mother    Depression Sister    Irritable bowel syndrome Sister    Depression Brother    Alcohol abuse Maternal Grandfather    Alcohol abuse Paternal Grandfather    Diabetes Maternal Grandmother    Breast cancer Paternal Grandmother     Social History   Tobacco Use   Smoking status: Former    Types: Cigarettes    Quit date: 09/07/2006    Years since quitting: 14.6   Smokeless tobacco: Never  Vaping Use   Vaping Use: Never used  Substance Use Topics   Alcohol use: Yes    Comment: socially and self destructively   Drug use: Not Currently    Types: Marijuana    ROS   Objective:    Vitals: BP (!) 143/75 (BP Location: Left Arm)   Pulse 62   Temp 98.3 F (36.8 C) (Oral)   Resp 16   SpO2 99%   Physical Exam Constitutional:      General: She is not in acute distress.    Appearance: Normal appearance. She is well-developed. She is not ill-appearing, toxic-appearing or diaphoretic.  HENT:     Head: Normocephalic and atraumatic.     Right Ear: Tympanic membrane, ear canal and external ear normal. No drainage or tenderness. No middle ear effusion. There is no impacted cerumen. Tympanic membrane is not erythematous.     Left Ear: Tympanic membrane, ear canal and external ear normal. No drainage or tenderness.  No middle ear effusion. There is no impacted cerumen. Tympanic membrane is not erythematous.     Nose: Nose normal. No congestion or rhinorrhea.     Mouth/Throat:     Mouth: Mucous membranes are moist. No oral lesions.     Pharynx: No pharyngeal swelling, oropharyngeal exudate, posterior oropharyngeal erythema or uvula swelling.     Tonsils: No tonsillar exudate or tonsillar abscesses.  Eyes:     General: No scleral icterus.       Right eye: No discharge.        Left eye: No discharge.     Extraocular Movements: Extraocular movements intact.     Right eye: Normal extraocular motion.     Left eye: Normal extraocular motion.     Conjunctiva/sclera: Conjunctivae normal.     Pupils: Pupils are equal, round, and reactive to light.  Cardiovascular:     Rate and Rhythm: Normal rate and regular rhythm.     Pulses: Normal pulses.     Heart sounds: Normal heart sounds. No murmur heard.   No friction rub. No gallop.  Pulmonary:     Effort: Pulmonary effort is normal. No respiratory distress.     Breath sounds: Normal breath sounds. No stridor. No wheezing, rhonchi or rales.  Musculoskeletal:     Cervical back: Normal range of motion and neck supple. No rigidity or tenderness.  Lymphadenopathy:     Cervical: No cervical adenopathy.  Skin:    General: Skin is  warm and dry.     Findings: No rash.  Neurological:     General: No focal deficit present.     Mental Status: She is alert and oriented to person, place, and time.     Cranial Nerves: No cranial nerve deficit.     Motor: No weakness.     Coordination: Coordination normal.     Gait: Gait normal.     Deep Tendon Reflexes: Reflexes normal.  Psychiatric:        Mood and Affect: Mood normal.        Behavior: Behavior normal.        Thought Content: Thought content normal.        Judgment: Judgment normal.      Assessment and Plan :   PDMP not reviewed this encounter.  1. Allergic rhinitis due to fungal spores, unspecified seasonality   2. Sinus pressure   3. Cough   4. Dizziness     Suspect allergic rhinitis secondary to fungal spores.  Recommended triple medical therapy with Flonase, Xyzal, pseudoephedrine.  Plan is to have the mold work done this week.  Recommended supportive care otherwise.  As patient had concerns about hearing rattling in her left lower lungs we obtained a chest x-ray.  We will call her with results are abnormal otherwise will follow the treatment plan as discussed in clinic.  Follow-up with PCP as soon as possible.  Counseled patient on potential for adverse effects with medications prescribed/recommended today, ER and return-to-clinic precautions discussed, patient verbalized understanding.    Wallis Bamberg, [place] 04/19/21 [id number]

## 2021-04-19 NOTE — Discharge Instructions (Addendum)
Please make sure that you start the nasal spray, the antihistamine and decongestant for what I suspect is allergic rhinitis due to the mold in your home.  We will call and let you know if your chest x-ray results are positive for an illness that requires treatment.  Otherwise if you do not hear from [place], that means your chest x-ray was normal.  Please make sure you follow-up with your regular doctor if your symptoms persist.

## 2021-04-19 NOTE — ED Triage Notes (Addendum)
Nausea, vomiting, head pressure (not a headache) and dizziness on-going x 1 month. Believes its from the mold in her apartment. Coughing occasionally throughout the day and night and waking up at night coughing. States she has had negative pregnancy tests since then.

## 2021-04-25 NOTE — Progress Notes (Signed)
NEUROLOGY CONSULTATION NOTE  Kimberly Harper MRN: [id number] DOB: 10/10/1985  Referring provider: Janeece Agee, NP Primary care provider: Janeece Agee, NP  Reason for consult:  migraines  Assessment/Plan:   Migraine without aura, without status migrainosus, intractable  Due to lightheadedness and low blood pressure, I would not increase propranolol. She has already tried topiramate and several antidepressants (amitriptyline, venlafaxine), which I would not want to start due to possible effects on her Bipolar. Migraine prevention:  Emgality.  Will have her fill out forms for [employer] Migraine rescue:  Will give her samples of Ubrelvy - triptans cause anxiety due to chest discomfort. Limit use of pain relievers to no more than 2 days out of week to prevent risk of rebound or medication-overuse headache. Keep headache diary Follow up 6 months.    Subjective:  Kimberly Harper is a 35 year old female with asthma, Bipolar disorder, depression, anxiety and history of substance abuse who presents for migraines.  History supplemented by referring provider's note.  Onset:  She started having migraines in between 2008 and 2010.  She was on propranolol for a while and helped.  She stopped propranolol a couple of years later.  Migraines started to get worse in 2020. Location:  bi-temporal/frontal/periorbital (may be either side), sometimes occipital Quality:  pressure and throbbing Intensity:  10/10.  She denies new headache, thunderclap headache or severe headache that wakes her from sleep. Aura:  absent Prodrome:  absent Associated symptoms:  Photophobia, phonophobia, osmophobia, movement sensitivity.  She denies associated nausea, vomiting, visual disturbance, unilateral numbness or weakness. Duration:  1 to 5 days Frequency:  once a week to once every other week Started propranolol in June-July and they are now once a week (lasting up to 3 days).  However she reports feeling  dizziness, lightheaded and nausea.  However, she also found out she had black mold in her apartment. She had some of the mold removed but there was no ventilation or replacing the carpet.  An inspector is supposed to come this week to check air quality Frequency of abortive medication: only when she has a migraine Triggers:  emotional stress/anxiety, skipped meals, beginning or end of menses Relieving factors:  Ice packs Activity:  movement aggravates pain  Current NSAIDS/analgesics:  Excedrin Migraine, acetaminophen, ibuprofen Current triptans:  none Current ergotamine:  none Current anti-emetic:  promethazine 25mg  Current muscle relaxants:  none Current Antihypertensive medications:  propranolol 20mg  daily, clonidine  Current Antidepressant medications:  none Current Anticonvulsant medications:  none Current anti-CGRP:  none Current Vitamins/Herbal/Supplements:  none Current Antihistamines/Decongestants:  Sudafed, Flonase, Xyzal Other therapy:  none Hormone/birth control:  none  Past NSAIDS/analgesics:  naproxen Past abortive triptans:  naratriptan Past abortive ergotamine:  none Past muscle relaxants:  none Past anti-emetic:  none Past antihypertensive medications:  no Past antidepressant medications:  venlafaxine, amitriptyline, trazodone, bupropion, citalopram, mirtazapine Past anticonvulsant medications:  topiramate, gabapentin Past anti-CGRP:  no Past vitamins/Herbal/Supplements:  no Past antihistamines/decongestants:  Zyrtec, hydroxyzine Other past therapies:  no  Caffeine:  No - makes her dizzy Diet:  No soda.  Drinks a lot of water Exercise:  not routine Depression:  okay; Anxiety:  yes Other pain:  when laying down, notes back stiffness Sleep:  poor.  Able to fall asleep but wakes up frequently.   Family history of headache:  brother   CBC and CMP on 04/12/2021 reviewed were unremarkable.   PAST MEDICAL HISTORY: Past Medical History:  Diagnosis Date    Alcoholic (HCC)  Anemia    [facility] 01/17/2020   Anxiety    Asthma    Bipolar 1 disorder (HCC)    Blood transfusion without reported diagnosis    [facility] 01/17/2020   Chronic headaches    Depression    [facility] 01/17/2020   Major depressive disorder    Substance abuse (HCC)    [facility] 01/17/2020    PAST SURGICAL HISTORY: Past Surgical History:  Procedure Laterality Date   CESAREAN SECTION N/A    [facility] 01/17/2020   DILATION AND CURETTAGE OF UTERUS     HAND SURGERY Right    WISDOM TOOTH EXTRACTION      MEDICATIONS: Current Outpatient Medications on File Prior to Visit  Medication Sig Dispense Refill   buPROPion (WELLBUTRIN XL) 300 MG 24 hr tablet Take 1 tablet (300 mg total) by mouth every morning. (Patient not taking: Reported on 04/12/2021) 90 tablet 0   cloNIDine (CATAPRES) 0.1 MG tablet Take 1 tablet (0.1 mg total) by mouth 3 (three) times daily as needed (with alprazolam). 90 tablet 3   fluticasone (FLONASE) 50 MCG/ACT nasal spray Place 2 sprays into both nostrils daily. 16 g 0   levocetirizine (XYZAL) 5 MG tablet Take 1 tablet (5 mg total) by mouth every evening. 30 tablet 0   naproxen (NAPROSYN) 500 MG tablet Take 1 tablet (500 mg total) by mouth 2 (two) times daily. (Patient not taking: Reported on 04/12/2021) 30 tablet 0   omeprazole (PRILOSEC) 40 MG capsule Take 1 capsule twice a day for 12 weeks then take 1 capsule daily 60 capsule 6   ondansetron (ZOFRAN) 4 MG tablet Take 1 tablet (4 mg total) by mouth every 8 (eight) hours as needed for nausea or vomiting. 50 tablet 0   propranolol (INDERAL) 20 MG tablet Take 1 tablet (20 mg total) by mouth daily. 90 tablet 0   pseudoephedrine (SUDAFED) 30 MG tablet Take 1 tablet (30 mg total) by mouth every 8 (eight) hours as needed for congestion. 30 tablet 0   QUEtiapine (SEROQUEL) 300 MG tablet Take 1 tablet (300 mg total) by mouth at bedtime. (Patient not taking: Reported on 04/12/2021) 90 tablet 0   traZODone (DESYREL) 100  MG tablet TAKE 1 TABLET(100 MG) BY MOUTH AT BEDTIME AS NEEDED FOR SLEEP (Patient not taking: Reported on 04/12/2021) 90 tablet 0   Vitamin D, Ergocalciferol, (DRISDOL) 1.25 MG (50000 UNIT) CAPS capsule TAKE 1 CAPSULE BY MOUTH EVERY 7 DAYS 12 capsule 0   [DISCONTINUED] montelukast (SINGULAIR) 10 MG tablet Take 1 tablet (10 mg total) by mouth at bedtime. 30 tablet 3   No current facility-administered medications on file prior to visit.    ALLERGIES: Allergies  Allergen Reactions   Multiple Vitamins-Iron Swelling    States that vitamins and iron have caused swelling of tongue and gums    FAMILY HISTORY: Family History  Problem Relation Age of Onset   Hypertension Mother    Diabetes Mother    Stroke Mother    Depression Mother    Depression Sister    Irritable bowel syndrome Sister    Depression Brother    Alcohol abuse Maternal Grandfather    Alcohol abuse Paternal Grandfather    Diabetes Maternal Grandmother    Breast cancer Paternal Grandmother     Objective:  Blood pressure (!) 106/59, pulse 64, height 5\' 4"  (1.626 m), weight 135 lb (61.2 kg), SpO2 99 %. General: No acute distress.  Patient appears well-groomed.   Head:  Normocephalic/atraumatic Eyes:  fundi examined but not  visualized Neck: supple, no paraspinal tenderness, full range of motion Back: No paraspinal tenderness Heart: regular rate and rhythm Lungs: Clear to auscultation bilaterally. Vascular: No carotid bruits. Neurological Exam: Mental status: alert and oriented to person, place, and time, recent and remote memory intact, fund of knowledge intact, attention and concentration intact, speech fluent and not dysarthric, language intact. Cranial nerves: CN I: not tested CN II: pupils equal, round and reactive to light, visual fields intact CN III, IV, VI:  full range of motion, no nystagmus, no ptosis CN V: facial sensation intact. CN VII: upper and lower face symmetric CN VIII: hearing intact CN IX, X: gag  intact, uvula midline CN XI: sternocleidomastoid and trapezius muscles intact CN XII: tongue midline Bulk & Tone: normal, no fasciculations. Motor:  muscle strength 5/5 throughout Sensation:  Pinprick, temperature and vibratory sensation intact. Deep Tendon Reflexes:  2+ throughout,  toes downgoing.   Finger to nose testing:  Without dysmetria.   Heel to shin:  Without dysmetria.   Gait:  Normal station and stride.  Romberg negative.    Thank you for allowing me to take part in the care of this patient.  Shon Millet, DO  CC: Janeece Agee, NP

## 2021-04-26 ENCOUNTER — Other Ambulatory Visit: Payer: Self-pay

## 2021-04-26 ENCOUNTER — Encounter: Payer: Self-pay | Admitting: Neurology

## 2021-04-26 ENCOUNTER — Ambulatory Visit (INDEPENDENT_AMBULATORY_CARE_PROVIDER_SITE_OTHER): Payer: Self-pay | Admitting: Neurology

## 2021-04-26 VITALS — BP 106/59 | HR 64 | Ht 64.0 in | Wt 135.0 lb

## 2021-04-26 DIAGNOSIS — G43019 Migraine without aura, intractable, without status migrainosus: Secondary | ICD-10-CM

## 2021-04-26 MED ORDER — EMGALITY 120 MG/ML ~~LOC~~ SOAJ: 120.0000 mg | SUBCUTANEOUS | 5 refills | Status: DC

## 2021-04-26 MED ORDER — EMGALITY 120 MG/ML ~~LOC~~ SOAJ
240.0000 mg | Freq: Once | SUBCUTANEOUS | 0 refills | Status: AC
Start: 2021-04-26 — End: 2021-04-26

## 2021-04-26 NOTE — Patient Instructions (Signed)
  Start Emgality - first dose 2 injections but then just 1 injection every 28 days.  Keep in refrigerator and take out about 30 minutes before injecting.. Take Ubrelvy 100mg  at earliest onset of headache.  May repeat dose once in 2 hours if needed.  Maximum 2 tablets in 24 hours. Limit use of pain relievers to no more than 2 days out of the week.  These medications include acetaminophen, NSAIDs (ibuprofen/Advil/Motrin, naproxen/Aleve, triptans (Imitrex/sumatriptan), Excedrin, and narcotics.  This will help reduce risk of rebound headaches. Be aware of common food triggers:  - Caffeine:  coffee, black tea, cola, Mt. Dew  - Chocolate  - Dairy:  aged cheeses (brie, blue, cheddar, gouda, [place], provolone, [place], Swiss, etc), chocolate milk, buttermilk, sour cream, limit eggs and yogurt  - Nuts, peanut butter  - Alcohol  - Cereals/grains:  FRESH breads (fresh bagels, sourdough, doughnuts), yeast productions  - Processed/canned/aged/cured meats (pre-packaged deli meats, hotdogs)  - MSG/glutamate:  soy sauce, flavor enhancer, pickled/preserved/marinated foods  - Sweeteners:  aspartame (Equal, Nutrasweet).  Sugar and Splenda are okay  - Vegetables:  legumes (lima beans, lentils, snow peas, fava beans, pinto peans, peas, garbanzo beans), sauerkraut, onions, olives, pickles  - Fruit:  avocados, bananas, citrus fruit (orange, lemon, grapefruit), mango  - Other:  Frozen meals, macaroni and cheese Routine exercise Stay adequately hydrated (aim for 64 oz water daily) Keep headache diary Maintain proper stress management Maintain proper sleep hygiene Do not skip meals Consider supplements:  magnesium citrate 400mg  daily, riboflavin 400mg  daily, coenzyme Q10 100mg  three times daily.

## 2021-04-27 ENCOUNTER — Encounter: Payer: Self-pay | Admitting: Neurology

## 2021-04-28 ENCOUNTER — Telehealth: Payer: Self-pay

## 2021-04-28 NOTE — Telephone Encounter (Signed)
New message     Kimberly Harper Key: [id number] - PA Case ID: [id number] - Rx #: [id number] Need help? Call us at [phone] Status Sent to [facility] Emgality 120MG /ML auto-injectors (migraine) Form [employer] Electronic PA Form [phone] NCPDP)

## 2021-04-28 NOTE — Telephone Encounter (Signed)
F/u   Outcome Approved today  Your request has been approved  Drug Emgality 120MG /ML auto-injectors (migraine)  Form [employer] Electronic PA Form [phone] NCPDP) Original Claim Info

## 2021-05-19 ENCOUNTER — Ambulatory Visit (INDEPENDENT_AMBULATORY_CARE_PROVIDER_SITE_OTHER): Payer: Medicare Other | Admitting: Registered Nurse

## 2021-05-19 ENCOUNTER — Encounter: Payer: Self-pay | Admitting: Registered Nurse

## 2021-05-19 ENCOUNTER — Other Ambulatory Visit: Payer: Self-pay

## 2021-05-19 VITALS — BP 102/65 | HR 62 | Temp 98.3°F | Resp 18 | Ht 64.0 in | Wt 134.0 lb

## 2021-05-19 DIAGNOSIS — R0989 Other specified symptoms and signs involving the circulatory and respiratory systems: Secondary | ICD-10-CM

## 2021-05-19 MED ORDER — SUCRALFATE 1 GM/10ML PO SUSP: 1.0000 g | Freq: Three times a day (TID) | ORAL | 0 refills | Status: DC

## 2021-05-19 MED ORDER — FAMOTIDINE 20 MG PO TABS: 20.0000 mg | ORAL_TABLET | Freq: Two times a day (BID) | ORAL | 0 refills | Status: DC

## 2021-05-19 NOTE — Patient Instructions (Addendum)
Ms. Kimberly Harper to see you again. Sorry that you're still not feeling well.  I recommend you touch base with Dr. Orvan Falconer - reach out to [phone] to schedule.  Start famotidine once daily,sucralfate 4x daily - before meals and before going to sleep.  If no improvement, let me know  Thank you  Rich   If you have lab work done today you will be contacted with your lab results within the next 2 weeks.  If you have not heard from [place] then please contact us. The fastest way to get your results is to register for My Chart.   IF you received an x-ray today, you will receive an invoice from [facility]. Please contact [facility] at [phone] with questions or concerns regarding your invoice.   IF you received labwork today, you will receive an invoice from [facility]. Please contact [facility] at [phone] with questions or concerns regarding your invoice.   Our billing staff will not be able to assist you with questions regarding bills from these companies.  You will be contacted with the lab results as soon as they are available. The fastest way to get your results is to activate your My Chart account. Instructions are located on the last page of this paperwork. If you have not heard from [place] regarding the results in 2 weeks, please contact this office.

## 2021-05-19 NOTE — Progress Notes (Signed)
Established Patient Office Visit  Subjective:  Patient ID: Kimberly Harper, female    DOB: 1986-06-25  Age: 35 y.o. MRN: [id number]  CC:  Chief Complaint  Patient presents with   Follow-up    Patient states she is here for a follow up with waking up nauseous from the mold that was in her home      HPI Kimberly Harper presents for follow up  Had been waking up nauseous - suspected to be from mold in her home.  We had seen her for a similar concern on 04/12/21  Symptoms at that time included early satiety, nausea with vomiting.   Still having nausea on waking Stool is not foul smelling anymore  Denies diarrhea Mild constipation, still notes some early satiety. Weight is stable No difficulty breathing, cough, congestion.  Nausea every morning on waking. No vomiting. Had been taking probiotics and maca root, ran out a few days ago - no change since stopping these.  Notes last meal before bed is usually right before bed.  Usually going to bed at 10p-12a, but now bed at 530am-730am due to job, waking up at 9-12am.   Headaches stable, following with Dr. Everlena Cooper. Still occurring 3-4 times monthly.   Past Medical History:  Diagnosis Date   Alcoholic (HCC)    Anemia    [facility] 01/17/2020   Anxiety    Asthma    Bipolar 1 disorder (HCC)    Blood transfusion without reported diagnosis    [facility] 01/17/2020   Chronic headaches    Depression    [facility] 01/17/2020   Major depressive disorder    Substance abuse (HCC)    [facility] 01/17/2020    Past Surgical History:  Procedure Laterality Date   CESAREAN SECTION N/A    [facility] 01/17/2020   DILATION AND CURETTAGE OF UTERUS     HAND SURGERY Right    WISDOM TOOTH EXTRACTION      Family History  Problem Relation Age of Onset   Hypertension Mother    Diabetes Mother    Stroke Mother    Depression Mother    Depression Sister    Irritable bowel syndrome Sister    Migraines Brother    Depression Brother     Stroke Maternal Grandmother    Diabetes Maternal Grandmother    Alcohol abuse Maternal Grandfather    Stroke Paternal Grandmother    Breast cancer Paternal Grandmother    Alcohol abuse Paternal Grandfather     Social History   Socioeconomic History   Marital status: Single    Spouse name: Not on file   Number of children: 1   Years of education: Not on file   Highest education level: Not on file  Occupational History   Occupation: Academic librarian  Tobacco Use   Smoking status: Former    Types: Cigarettes    Quit date: 09/07/2006    Years since quitting: 14.7   Smokeless tobacco: Never  Vaping Use   Vaping Use: Never used  Substance and Sexual Activity   Alcohol use: Yes    Comment: now soically   Drug use: Not Currently    Types: Marijuana   Sexual activity: Yes    Birth control/protection: None  Other Topics Concern   Not on file  Social History Narrative   Right handed   Social Determinants of Health   Financial Resource Strain: Not on file  Food Insecurity: Not on file  Transportation Needs: Not on file  Physical Activity:  Not on file  Stress: Not on file  Social Connections: Not on file  Intimate Partner Violence: Not on file    Outpatient Medications Prior to Visit  Medication Sig Dispense Refill   levocetirizine (XYZAL) 5 MG tablet Take 1 tablet (5 mg total) by mouth every evening. 30 tablet 0   omeprazole (PRILOSEC) 40 MG capsule Take 1 capsule twice a day for 12 weeks then take 1 capsule daily 60 capsule 6   ondansetron (ZOFRAN) 4 MG tablet Take 1 tablet (4 mg total) by mouth every 8 (eight) hours as needed for nausea or vomiting. 50 tablet 0   propranolol (INDERAL) 20 MG tablet Take 1 tablet (20 mg total) by mouth daily. 90 tablet 0   pseudoephedrine (SUDAFED) 30 MG tablet Take 1 tablet (30 mg total) by mouth every 8 (eight) hours as needed for congestion. 30 tablet 0   traZODone (DESYREL) 100 MG tablet TAKE 1 TABLET(100 MG) BY MOUTH AT BEDTIME AS  NEEDED FOR SLEEP 90 tablet 0   Vitamin D, Ergocalciferol, (DRISDOL) 1.25 MG (50000 UNIT) CAPS capsule TAKE 1 CAPSULE BY MOUTH EVERY 7 DAYS 12 capsule 0   fluticasone (FLONASE) 50 MCG/ACT nasal spray Place 2 sprays into both nostrils daily. (Patient not taking: No sig reported) 16 g 0   Galcanezumab-gnlm (EMGALITY) 120 MG/ML SOAJ Inject 120 mg into the skin every 28 (twenty-eight) days. (Patient not taking: Reported on 05/19/2021) 1.12 mL 5   naproxen (NAPROSYN) 500 MG tablet Take 1 tablet (500 mg total) by mouth 2 (two) times daily. (Patient not taking: No sig reported) 30 tablet 0   ondansetron (ZOFRAN-ODT) 4 MG disintegrating tablet Take by mouth. (Patient not taking: No sig reported)     promethazine (PHENERGAN) 25 MG tablet Take 25 mg by mouth every 6 (six) hours as needed. (Patient not taking: No sig reported)     No facility-administered medications prior to visit.    Allergies  Allergen Reactions   Multiple Vitamins-Iron Swelling    States that vitamins and iron have caused swelling of tongue and gums    ROS Review of Systems  Constitutional:  Positive for fatigue. Negative for activity change, appetite change, chills, diaphoresis, fever and unexpected weight change.  HENT:  Positive for congestion.   Eyes: Negative.   Respiratory:  Positive for cough. Negative for apnea, choking, chest tightness, shortness of breath, wheezing and stridor.   Cardiovascular: Negative.   Gastrointestinal: Negative.   Genitourinary: Negative.   Musculoskeletal: Negative.   Skin: Negative.   Neurological:  Positive for headaches. Negative for dizziness, tremors, seizures, syncope, facial asymmetry, speech difficulty, weakness, light-headedness and numbness.  Psychiatric/Behavioral: Negative.    All other systems reviewed and are negative.    Objective:    Physical Exam  BP 102/65   Pulse 62   Temp 98.3 F (36.8 C) (Temporal)   Resp 18   Ht 5\' 4"  (1.626 m)   Wt 134 lb (60.8 kg)   SpO2  99%   BMI 23.00 kg/m  Wt Readings from Last 3 Encounters:  05/19/21 134 lb (60.8 kg)  04/27/21 135 lb (61.2 kg)  04/12/21 131 lb 3.2 oz (59.5 kg)     Health Maintenance Due  Topic Date Due   COVID-19 Vaccine (1) Never done   PAP SMEAR-Modifier  Never done    There are no preventive care reminders to display for this patient.  Lab Results  Component Value Date   TSH 1.16 04/12/2021   Lab Results  Component  Value Date   WBC 7.3 04/12/2021   HGB 12.2 04/12/2021   HCT 37.7 04/12/2021   MCV 80.8 04/12/2021   PLT 277.0 04/12/2021   Lab Results  Component Value Date   NA 139 04/12/2021   K 3.9 04/12/2021   CO2 29 04/12/2021   GLUCOSE 83 04/12/2021   BUN 13 04/12/2021   CREATININE 0.92 04/12/2021   BILITOT 1.0 04/12/2021   ALKPHOS 52 04/12/2021   AST 13 04/12/2021   ALT 10 04/12/2021   PROT 7.6 04/12/2021   ALBUMIN 4.4 04/12/2021   CALCIUM 9.7 04/12/2021   ANIONGAP 9 03/15/2020   GFR 79.80 04/12/2021   Lab Results  Component Value Date   CHOL 189 01/19/2020   Lab Results  Component Value Date   HDL 47 01/19/2020   Lab Results  Component Value Date   LDLCALC 122 (H) 01/19/2020   Lab Results  Component Value Date   TRIG 109 01/19/2020   Lab Results  Component Value Date   CHOLHDL 4.0 01/19/2020   Lab Results  Component Value Date   HGBA1C 5.3 01/19/2020      Assessment & Plan:   Problem List Items Addressed This Visit   None Visit Diagnoses     Globus sensation    -  Primary   Relevant Medications   famotidine (PEPCID) 20 MG tablet   sucralfate (CARAFATE) 1 GM/10ML suspension       Meds ordered this encounter  Medications   famotidine (PEPCID) 20 MG tablet    Sig: Take 1 tablet (20 mg total) by mouth 2 (two) times daily.    Dispense:  90 tablet    Refill:  0    Order Specific Question:   Supervising Provider    Answer:   Neva Seat, JEFFREY R [[id number]]   sucralfate (CARAFATE) 1 GM/10ML suspension    Sig: Take 10 mLs (1 g total) by mouth  4 (four) times daily -  with meals and at bedtime.    Dispense:  420 mL    Refill:  0    Order Specific Question:   Supervising Provider    Answer:   Neva Seat, JEFFREY R [[id number]]    Follow-up: No follow-ups on file.   PLAN Discussed possible etiologies to these symptoms. Given that she has not yet seen GI, would like her to be seen there for possible contributors. Additionally, seasonal allergies, shift work sleep disorder, complex migraines, anxiety and depression, and a number of other chronic concerns that may apply to her can cause these symptoms - without distinct symptoms of fungal infection, would need to work on controlling chronic conditions first. Will work on GERD with addition of famotidine and sucralfate.  Check in in 1-2 weeks with pt Patient encouraged to call clinic with any questions, comments, or concerns.  Janeece Agee, NP

## 2021-06-10 ENCOUNTER — Encounter: Payer: Self-pay | Admitting: Emergency Medicine

## 2021-06-10 ENCOUNTER — Other Ambulatory Visit: Payer: Self-pay

## 2021-06-10 ENCOUNTER — Ambulatory Visit
Admission: EM | Admit: 2021-06-10 | Discharge: 2021-06-10 | Disposition: A | Discharge status: Home / Self Care | Payer: Medicare Other | Attending: Physician Assistant | Admitting: Physician Assistant

## 2021-06-10 DIAGNOSIS — N760 Acute vaginitis: Secondary | ICD-10-CM | POA: Insufficient documentation

## 2021-06-10 MED ORDER — FLUCONAZOLE 150 MG PO TABS: 150.0000 mg | ORAL_TABLET | Freq: Every day | ORAL | 1 refills | Status: DC

## 2021-06-10 MED ORDER — METRONIDAZOLE 500 MG PO TABS: 500.0000 mg | ORAL_TABLET | Freq: Two times a day (BID) | ORAL | 0 refills | Status: AC

## 2021-06-10 NOTE — ED Triage Notes (Addendum)
Patient reports vaginal dryness and odor x 2 days. Requesting to be checked for STDs, BV, and yeast.

## 2021-06-10 NOTE — Discharge Instructions (Addendum)
Return if any problems.

## 2021-06-13 LAB — CERVICOVAGINAL ANCILLARY ONLY
Bacterial Vaginitis (gardnerella): POSITIVE — AB
Candida Glabrata: NEGATIVE
Candida Vaginitis: NEGATIVE
Chlamydia: NEGATIVE
Comment: NEGATIVE
Comment: NEGATIVE
Comment: NEGATIVE
Comment: NEGATIVE
Comment: NEGATIVE
Comment: NORMAL
Neisseria Gonorrhea: NEGATIVE
Trichomonas: NEGATIVE

## 2021-06-13 NOTE — ED Provider Notes (Signed)
[name]    CSN: [id number] Arrival date & time: 06/10/21  1301      History   Chief Complaint Chief Complaint  Patient presents with   Vaginitis    HPI Kimberly Harper is a 35 y.o. female.   The history is provided by the patient. No language interpreter was used.  Vaginal Discharge Quality:  Watery Severity:  Moderate Onset quality:  Gradual Duration:  2 days Timing:  Constant Progression:  Worsening Chronicity:  New Relieved by:  Nothing Worsened by:  Nothing Ineffective treatments:  None tried Associated symptoms: vaginal itching    Past Medical History:  Diagnosis Date   Alcoholic (HCC)    Anemia    [employer] 01/17/2020   Anxiety    Asthma    Bipolar 1 disorder (HCC)    Blood transfusion without reported diagnosis    [employer] 01/17/2020   Chronic headaches    Depression    [employer] 01/17/2020   Major depressive disorder    Substance abuse (HCC)    [employer] 01/17/2020    Patient Active Problem List   Diagnosis Date Noted   Panic disorder 04/12/2020   GAD (generalized anxiety disorder) 03/18/2020   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   Borderline personality disorder (HCC) 02/06/2020   Bipolar 2 disorder, major depressive episode (HCC) 12/06/2019    Past Surgical History:  Procedure Laterality Date   CESAREAN SECTION N/A    [employer] 01/17/2020   DILATION AND CURETTAGE OF UTERUS     HAND SURGERY Right    WISDOM TOOTH EXTRACTION      OB History   No obstetric history on file.      Home Medications    Prior to Admission medications   Medication Sig Start Date End Date Taking? Authorizing Provider  fluconazole (DIFLUCAN) 150 MG tablet Take 1 tablet (150 mg total) by mouth daily. 06/10/21  Yes Cheron Schaumann K, PA-C  metroNIDAZOLE (FLAGYL) 500 MG tablet Take 1 tablet (500 mg total) by mouth 2 (two) times daily for 7 days. 06/10/21 06/17/21 Yes Elson Areas, PA-C  famotidine (PEPCID) 20 MG tablet Take 1 tablet (20 mg  total) by mouth 2 (two) times daily. 05/19/21   Janeece Agee, NP  fluticasone (FLONASE) 50 MCG/ACT nasal spray Place 2 sprays into both nostrils daily. Patient not taking: No sig reported 04/19/21   Wallis Bamberg, PA-C  Galcanezumab-gnlm [facility]) 120 MG/ML SOAJ Inject 120 mg into the skin every 28 (twenty-eight) days. Patient not taking: Reported on 05/19/2021 04/26/21   Drema Dallas, DO  levocetirizine (XYZAL) 5 MG tablet Take 1 tablet (5 mg total) by mouth every evening. 04/19/21   Wallis Bamberg, PA-C  naproxen (NAPROSYN) 500 MG tablet Take 1 tablet (500 mg total) by mouth 2 (two) times daily. Patient not taking: No sig reported 12/05/20   Wieters, Hallie C, PA-C  omeprazole (PRILOSEC) 40 MG capsule Take 1 capsule twice a day for 12 weeks then take 1 capsule daily 01/07/21   Tressia Danas, MD  ondansetron (ZOFRAN) 4 MG tablet Take 1 tablet (4 mg total) by mouth every 8 (eight) hours as needed for nausea or vomiting. 04/13/21   Janeece Agee, NP  ondansetron (ZOFRAN-ODT) 4 MG disintegrating tablet Take by mouth. Patient not taking: No sig reported 01/17/21   [provider]  promethazine (PHENERGAN) 25 MG tablet Take 25 mg by mouth every 6 (six) hours as needed. Patient not taking: No sig reported 01/17/21   [provider]  propranolol (INDERAL) 20  MG tablet Take 1 tablet (20 mg total) by mouth daily. 04/13/21   Janeece Agee, NP  pseudoephedrine (SUDAFED) 30 MG tablet Take 1 tablet (30 mg total) by mouth every 8 (eight) hours as needed for congestion. 04/19/21   Wallis Bamberg, PA-C  sucralfate (CARAFATE) 1 GM/10ML suspension Take 10 mLs (1 g total) by mouth 4 (four) times daily -  with meals and at bedtime. 05/19/21   Janeece Agee, NP  traZODone (DESYREL) 100 MG tablet TAKE 1 TABLET(100 MG) BY MOUTH AT BEDTIME AS NEEDED FOR SLEEP 01/20/21   Janeece Agee, NP  Vitamin D, Ergocalciferol, (DRISDOL) 1.25 MG (50000 UNIT) CAPS capsule TAKE 1 CAPSULE BY MOUTH EVERY 7 DAYS 04/16/21    Janeece Agee, NP  montelukast (SINGULAIR) 10 MG tablet Take 1 tablet (10 mg total) by mouth at bedtime. 01/19/20 02/07/20  Janeece Agee, NP    Family History Family History  Problem Relation Age of Onset   Hypertension Mother    Diabetes Mother    Stroke Mother    Depression Mother    Depression Sister    Irritable bowel syndrome Sister    Migraines Brother    Depression Brother    Stroke Maternal Grandmother    Diabetes Maternal Grandmother    Alcohol abuse Maternal Grandfather    Stroke Paternal Grandmother    Breast cancer Paternal Grandmother    Alcohol abuse Paternal Grandfather     Social History Social History   Tobacco Use   Smoking status: Former    Types: Cigarettes    Quit date: 09/07/2006    Years since quitting: 14.7   Smokeless tobacco: Never  Vaping Use   Vaping Use: Never used  Substance Use Topics   Alcohol use: Yes    Comment: now soically   Drug use: Not Currently    Types: Marijuana     Allergies   Multiple vitamins-iron   Review of Systems Review of Systems  Genitourinary:  Positive for vaginal discharge.  All other systems reviewed and are negative.   Physical Exam Triage Vital Signs ED Triage Vitals  Enc Vitals Group     BP --      Pulse Rate 06/10/21 1519 (!) 53     Resp 06/10/21 1519 16     Temp 06/10/21 1519 98 F (36.7 C)     Temp Source 06/10/21 1519 Oral     SpO2 --      Weight --      Height --      Head Circumference --      Peak Flow --      Pain Score 06/10/21 1520 5     Pain Loc --      Pain Edu? --      Excl. in GC? --    No data found.  Updated Vital Signs Pulse (!) 53   Temp 98 F (36.7 C) (Oral)   Resp 16   Visual Acuity Right Eye Distance:   Left Eye Distance:   Bilateral Distance:    Right Eye Near:   Left Eye Near:    Bilateral Near:     Physical Exam Constitutional:      Appearance: She is well-developed.  HENT:     Head: Normocephalic and atraumatic.  Eyes:      Conjunctiva/sclera: Conjunctivae normal.     Pupils: Pupils are equal, round, and reactive to light.  Cardiovascular:     Rate and Rhythm: Normal rate.  Pulmonary:  Effort: Pulmonary effort is normal.  Abdominal:     General: Abdomen is flat.  Genitourinary:    Vagina: Vaginal discharge present.     Comments: Vaginal discharge,  Thick white,  Adnexa no masses,  Cervix nontender Musculoskeletal:        General: Normal range of motion.     Cervical back: Normal range of motion and neck supple.  Skin:    General: Skin is warm.  Neurological:     General: No focal deficit present.     UC Treatments / Results  Labs (all labs ordered are listed, but only abnormal results are displayed) Labs Reviewed  CERVICOVAGINAL ANCILLARY ONLY    EKG   Radiology No results found.  Procedures Procedures (including critical care time)  Medications Ordered in UC Medications - No data to display  Initial Impression / Assessment and Plan / UC Course  I have reviewed the triage vital signs and the nursing notes.  Pertinent labs & imaging results that were available during my care of the patient were reviewed by me and considered in my medical decision making (see chart for details).      Final Clinical Impressions(s) / UC Diagnoses   Final diagnoses:  Vaginitis and vulvovaginitis     Discharge Instructions      Return if any problems.   ED Prescriptions     Medication Sig Dispense Auth. Provider   fluconazole (DIFLUCAN) 150 MG tablet Take 1 tablet (150 mg total) by mouth daily. 1 tablet Amareon Phung K, PA-C   metroNIDAZOLE (FLAGYL) 500 MG tablet Take 1 tablet (500 mg total) by mouth 2 (two) times daily for 7 days. 14 tablet Elson Areas, [place]      An After Visit Summary was printed and given to the patient.  PDMP not reviewed this encounter. An After Visit Summary was printed and given to the patient.    Elson Areas, [place] 06/13/21 [phone]

## 2021-06-21 ENCOUNTER — Ambulatory Visit
Admission: EM | Admit: 2021-06-21 | Discharge: 2021-06-21 | Disposition: A | Discharge status: Home / Self Care | Payer: Medicare Other | Attending: Internal Medicine | Admitting: Internal Medicine

## 2021-06-21 ENCOUNTER — Other Ambulatory Visit: Payer: Self-pay

## 2021-06-21 DIAGNOSIS — N898 Other specified noninflammatory disorders of vagina: Secondary | ICD-10-CM | POA: Insufficient documentation

## 2021-06-21 LAB — POCT URINALYSIS DIP (MANUAL ENTRY)
Bilirubin, UA: NEGATIVE
Glucose, UA: NEGATIVE mg/dL
Ketones, POC UA: NEGATIVE mg/dL
Leukocytes, UA: NEGATIVE
Nitrite, UA: NEGATIVE
Protein Ur, POC: NEGATIVE mg/dL
Spec Grav, UA: 1.02 (ref 1.010–1.025)
Urobilinogen, UA: 0.2 E.U./dL
pH, UA: 6 (ref 5.0–8.0)

## 2021-06-21 MED ORDER — FLUCONAZOLE 150 MG PO TABS: 150.0000 mg | ORAL_TABLET | ORAL | 0 refills | Status: DC

## 2021-06-21 NOTE — Progress Notes (Addendum)
New Patient Note  RE: Kimberly Harper MRN: [id number] DOB: Jun 05, 1986 Date of Office Visit: 06/22/2021  Consult requested by: Janeece Agee, NP Primary care provider: Janeece Agee, NP  Chief Complaint: Allergies (Mold exposure from apartment- started in August- dizziness, head pressure, mucus production. Has been repaired. )  History of Present Illness: I had the pleasure of seeing [name] for initial evaluation at the [facility] on 06/22/2021. She is a 35 y.o. female, who is referred here by Janeece Agee, NP for the evaluation of mold allergy.  Patient had some mold exposure in her apartment which was fixed.   She reports symptoms of coughing with mucous production, PND, dizziness, head pressure in the mornings, nausea. Symptoms have been going on for 4 months. Other triggers include exposure to unknown. Anosmia: no. Headache: yes and patient has migraines (follows with neurology). She has used sudafed with some  improvement in symptoms. Used Flonase a few times with unknown benefit. She has been taking blackseed oil supplement to help with the mucous production. Sinus infections: no. Previous work up includes: no.  Previous ENT evaluation: not recently, no prior sinus surgery. Previous sinus imaging: no. History of nasal polyps: no. Last eye exam: this year. History of reflux: takes omeprazole BID with good benefit.  04/19/2021 UC visit: "[name] is a 35 y.o. female presenting for 1 month history of persistent intermittent dizziness, coughing, nausea, vomiting, head/sinus pressure.  Symptoms are worse at night.  She was just found to have mold at home in her apartment.  Management is supposed to work on at this week.  Patient had an office visit with her regular care doctor 1 week ago.  Had extensive labs done.  Recommendation was to use allergy medications which she did not start.  Patient does have a history of this and asthma.  No  confusion, weakness, neck pain, stiffness, numbness or tingling, vision changes, chest pain, shortness of breath.  However, she does endorse that she started to feel some rattling in the left lower side of her lungs.  Patient is not a smoker.  Suspect allergic rhinitis secondary to fungal spores.  Recommended triple medical therapy with Flonase, Xyzal, pseudoephedrine.  Plan is to have the mold work done this week.  Recommended supportive care otherwise.  As patient had concerns about hearing rattling in her left lower lungs we obtained a chest x-ray.  We will call her with results are abnormal otherwise will follow the treatment plan as discussed in clinic.  Follow-up with PCP as soon as possible.  Counseled patient on potential for adverse effects with medications prescribed/recommended today, ER and return-to-clinic precautions discussed, patient verbalized understanding."  Assessment and Plan: Kimberly Harper is a 35 y.o. female with: Other allergic rhinitis Coughing with mucus, postnasal drip, sinus pressure for the last 4 months.  Concerned about mold allergy as she had mold issues in the apartment which was remediated.  Used Flonase for a few times with unknown benefit.  History of migraines.  No prior allergy evaluation. Today's skin prick testing showed: Positive to grass, ragweed, weed, trees, dust mites, cat, dog. Negative to mold and cockroach. Negative to common foods. Discussed with patient that I'm not sure how much of the above allergens are causing her current symptoms.  Start environmental control measures as below. Start Xyzal (levocetirizine) daily as needed. May take twice a day during allergy flares.  Use Flonase (fluticasone) nasal spray 1 spray per nostril twice a day as  needed for nasal congestion.  Use azelastine nasal spray 1-2 sprays per nostril twice a day as needed for runny nose/drainage. Nasal saline spray (i.e., Simply Saline) or nasal saline lavage (i.e., NeilMed) is  recommended as needed and prior to medicated nasal sprays. If no improvement will refer to ENT next.   Asthma Asthma as a child and had a flare a few years ago.  Denies any active symptoms. Today spirometry showed some mild obstruction with 17% improvement in FEV1 post bronchodilator treatment.  Clinically feeling unchanged. Given her other clinical symptoms will do a 2 month trial of daily controller medication. Daily controller medication(s): START Flovent 2 puffs twice a day with spacer and rinse mouth afterwards. May use albuterol rescue inhaler 2 puffs every 4 to 6 hours as needed for shortness of breath, chest tightness, coughing, and wheezing. May use albuterol rescue inhaler 2 puffs 5 to 15 minutes prior to strenuous physical activities. Monitor frequency of use.  Get spirometry at next visit.  Heartburn Continue famotidine 20 mg twice a day. Continue Prilosec 40 mg once a day. See handout for lifestyle and dietary modifications.  Return in about 2 months (around 08/22/2021).  Meds ordered this encounter  Medications   fluticasone (FLOVENT HFA) 110 MCG/ACT inhaler    Sig: Inhale 2 puffs into the lungs in the morning and at bedtime. with spacer and rinse mouth afterwards.    Dispense:  1 each    Refill:  3   azelastine (ASTELIN) 0.1 % nasal spray    Sig: Place 1-2 sprays into both nostrils 2 (two) times daily as needed (nasal drainage). Use in each nostril as directed    Dispense:  30 mL    Refill:  5   fluticasone (FLONASE) 50 MCG/ACT nasal spray    Sig: Place 1 spray into both nostrils 2 (two) times daily as needed (nasal congestion).    Dispense:  16 g    Refill:  5   levocetirizine (XYZAL) 5 MG tablet    Sig: Take 1 tablet (5 mg total) by mouth every evening.    Dispense:  30 tablet    Refill:  3    Lab Orders  No laboratory test(s) ordered today    Other allergy screening: Asthma: yes As a child and had a flare a few years ago.  Food allergy:  no Medication allergy: yes Hymenoptera allergy: no Urticaria: no Eczema:no History of recurrent infections suggestive of immunodeficency: no  Diagnostics: Spirometry:  Tracings reviewed. Her effort: It was hard to get consistent efforts and there is a question as to whether this reflects a maximal maneuver. FVC: 5.53L FEV1: 2.25L, 80% predicted FEV1/FVC ratio: 41% Interpretation: Spirometry consistent with mild obstructive disease with 17% improvement in FEV1 post bronchodilator treatment. Clinically feeling the same.   Please see scanned spirometry results for details.  Skin Testing: Environmental allergy panel and select foods. Positive to grass, ragweed, weed, trees, dust mites, cat, dog. Negative to mold and cockroach. Negative to common foods. Results discussed with patient/family.  Airborne Adult Perc - 06/22/21 1500     Time Antigen Placed 1500    Allergen Manufacturer Greer    Location Back    Number of Test 59    1. Control-Buffer 50% Glycerol Negative    2. Control-Histamine 1 mg/ml 2+    3. Albumin saline Negative    4. Bahia 2+    5. [place] Negative    6. Johnson Negative    7. Kentucky Blue 4+  8. Meadow Fescue 3+    9. Perennial Rye 4+    10. Sweet Vernal 2+    11. Timothy 4+    12. Cocklebur Negative    13. Burweed Marshelder Negative    14. Ragweed, short 4+    15. Ragweed, Giant Negative    16. Plantain,  English Negative    17. Lamb's Quarters Negative    18. Sheep Sorrell Negative    19. Rough Pigweed Negative    20. Marsh Elder, Rough Negative    21. Mugwort, Common Negative    22. Ash mix Negative    23. Birch mix Negative    24. Beech American Negative    25. Box, Elder Negative    26. Cedar, red Negative    27. Cottonwood, Guinea-Bissau Negative    28. Elm mix Negative    29. Hickory 4+    30. Maple mix Negative    31. Oak, Guinea-[place]issau mix Negative    32. Pecan Pollen 2+    33. Pine mix 4+    34. Sycamore Eastern Negative    35. Walnut,  Black Pollen Negative    36. Alternaria alternata Negative    37. Cladosporium Herbarum Negative    38. Aspergillus mix Negative    39. Penicillium mix Negative    40. Bipolaris sorokiniana (Helminthosporium) Negative    41. Drechslera spicifera (Curvularia) Negative    42. Mucor plumbeus Negative    43. Fusarium moniliforme Negative    44. Aureobasidium pullulans (pullulara) Negative    45. Rhizopus oryzae Negative    46. Botrytis cinera Negative    47. Epicoccum nigrum Negative    48. Phoma betae Negative    49. Candida Albicans Negative    50. Trichophyton mentagrophytes Negative    51. Mite, D Farinae  5,000 AU/ml 4+    52. Mite, D Pteronyssinus  5,000 AU/ml Negative    53. Cat Hair 10,000 BAU/ml 4+    54.  Dog Epithelia Negative    55. Mixed Feathers Negative    56. Horse Epithelia Negative    57. Cockroach, German Negative    58. Mouse Negative    59. Tobacco Leaf Negative             Food Perc - 06/22/21 1500       Test Information   Time Antigen Placed 1500    Allergen Manufacturer Greer    Location Back    Number of allergen test 10      Food   1. Peanut Negative    2. Soybean food Negative    3. Wheat, whole Negative    4. Sesame Negative    5. Milk, cow Negative    6. Egg White, chicken Negative    7. Casein Negative    8. Shellfish mix Negative    9. Fish mix Negative    10. Cashew Negative             Intradermal - 06/22/21 1600     Time Antigen Placed 1610    Allergen Manufacturer Waynette Buttery    Location Back    Number of Test 10    Control Negative    [place] 2+    Johnson 2+    Weed mix 2+    Mold 1 Negative    Mold 2 Negative    Mold 3 Negative    Mold 4 Negative    Dog 3+    Cockroach Negative  Past Medical History: Patient Active Problem List   Diagnosis Date Noted   Other allergic rhinitis 06/22/2021   Asthma 06/22/2021   Heartburn 06/22/2021   Panic disorder 04/12/2020   GAD (generalized anxiety disorder)  03/18/2020   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   Borderline personality disorder (HCC) 02/06/2020   Bipolar 2 disorder, major depressive episode (HCC) 12/06/2019   Past Medical History:  Diagnosis Date   Alcoholic (HCC)    Anemia    [facility] 01/17/2020   Anxiety    Asthma    Bipolar 1 disorder (HCC)    Blood transfusion without reported diagnosis    [facility] 01/17/2020   Chronic headaches    Depression    [employer] 01/17/2020   Major depressive disorder    Substance abuse (HCC)    [employer] 01/17/2020   Past Surgical History: Past Surgical History:  Procedure Laterality Date   CESAREAN SECTION N/A    [facility] 01/17/2020   COLONOSCOPY     earlier in year 2022   DILATION AND CURETTAGE OF UTERUS     HAND SURGERY Right    WISDOM TOOTH EXTRACTION     Medication List:  Current Outpatient Medications  Medication Sig Dispense Refill   ALPRAZolam (XANAX) 1 MG tablet Take 1 mg by mouth 2 (two) times daily as needed.     azelastine (ASTELIN) 0.1 % nasal spray Place 1-2 sprays into both nostrils 2 (two) times daily as needed (nasal drainage). Use in each nostril as directed 30 mL 5   famotidine (PEPCID) 20 MG tablet Take 1 tablet (20 mg total) by mouth 2 (two) times daily. 90 tablet 0   fluconazole (DIFLUCAN) 150 MG tablet Take 1 tablet (150 mg total) by mouth every 3 (three) days. May take second pill in 3 days if no resolution of symptoms with first pill 2 tablet 0   fluticasone (FLONASE) 50 MCG/ACT nasal spray Place 1 spray into both nostrils 2 (two) times daily as needed (nasal congestion). 16 g 5   fluticasone (FLOVENT HFA) 110 MCG/ACT inhaler Inhale 2 puffs into the lungs in the morning and at bedtime. with spacer and rinse mouth afterwards. 1 each 3   levocetirizine (XYZAL) 5 MG tablet Take 1 tablet (5 mg total) by mouth every evening. 30 tablet 3   omeprazole (PRILOSEC) 40 MG capsule Take 1 capsule twice a day for 12 weeks then take 1 capsule daily 60 capsule 6   pramipexole  (MIRAPEX) 0.25 MG tablet Take by mouth.     propranolol (INDERAL) 20 MG tablet Take 1 tablet (20 mg total) by mouth daily. 90 tablet 0   pseudoephedrine (SUDAFED) 30 MG tablet Take 1 tablet (30 mg total) by mouth every 8 (eight) hours as needed for congestion. 30 tablet 0   traZODone (DESYREL) 100 MG tablet TAKE 1 TABLET(100 MG) BY MOUTH AT BEDTIME AS NEEDED FOR SLEEP 90 tablet 0   Vitamin D, Ergocalciferol, (DRISDOL) 1.25 MG (50000 UNIT) CAPS capsule TAKE 1 CAPSULE BY MOUTH EVERY 7 DAYS 12 capsule 0   Galcanezumab-gnlm (EMGALITY) 120 MG/ML SOAJ Inject 120 mg into the skin every 28 (twenty-eight) days. (Patient not taking: Reported on 05/19/2021) 1.12 mL 5   naproxen (NAPROSYN) 500 MG tablet Take 1 tablet (500 mg total) by mouth 2 (two) times daily. (Patient not taking: Reported on 04/12/2021) 30 tablet 0   ondansetron (ZOFRAN) 4 MG tablet Take 1 tablet (4 mg total) by mouth every 8 (eight) hours as needed for nausea or vomiting. (Patient not taking: Reported  on 06/22/2021) 50 tablet 0   ondansetron (ZOFRAN-ODT) 4 MG disintegrating tablet Take by mouth. (Patient not taking: Reported on 04/26/2021)     sucralfate (CARAFATE) 1 GM/10ML suspension Take 10 mLs (1 g total) by mouth 4 (four) times daily -  with meals and at bedtime. (Patient not taking: Reported on 06/22/2021) 420 mL 0   No current facility-administered medications for this visit.   Allergies: Allergies  Allergen Reactions   Multiple Vitamins-Iron Swelling    States that vitamins and iron have caused swelling of tongue and gums   Social History: Social History   Socioeconomic History   Marital status: Single    Spouse name: Not on file   Number of children: 1   Years of education: Not on file   Highest education level: Not on file  Occupational History   Occupation: Academic librarian  Tobacco Use   Smoking status: Former    Types: Cigarettes    Quit date: 09/07/2006    Years since quitting: 14.8   Smokeless tobacco: Never   Vaping Use   Vaping Use: Never used  Substance and Sexual Activity   Alcohol use: Yes    Comment: now soically   Drug use: Not Currently    Types: Marijuana   Sexual activity: Yes    Birth control/protection: None  Other Topics Concern   Not on file  Social History Narrative   Right handed   Social Determinants of Health   Financial Resource Strain: Not on file  Food Insecurity: Not on file  Transportation Needs: Not on file  Physical Activity: Not on file  Stress: Not on file  Social Connections: Not on file   Lives in an apartment. Smoking: denies Occupation: inbound team member  Environmental History: Immunologist in the house: yes Carpet in the family room: yes Carpet in the bedroom: yes Heating: gas Cooling: central Pet: yes 1 dog  x 6 yrs  Family History: Family History  Problem Relation Age of Onset   Hypertension Mother    Diabetes Mother    Stroke Mother    Depression Mother    Depression Sister    Irritable bowel syndrome Sister    Asthma Brother    Allergic rhinitis Brother    Migraines Brother    Depression Brother    Stroke Maternal Grandmother    Diabetes Maternal Grandmother    Alcohol abuse Maternal Grandfather    Stroke Paternal Grandmother    Breast cancer Paternal Grandmother    Alcohol abuse Paternal Grandfather    Lupus Half-Sister    Angioedema Neg Hx    Eczema Neg Hx    Immunodeficiency Neg Hx    Atopy Neg Hx    Review of Systems  Constitutional:  Positive for appetite change. Negative for chills, fever and unexpected weight change.  HENT:  Positive for postnasal drip and sinus pressure. Negative for congestion and rhinorrhea.   Eyes:  Negative for itching.  Respiratory:  Positive for cough. Negative for chest tightness, shortness of breath and wheezing.   Cardiovascular:  Negative for chest pain.  Gastrointestinal:  Negative for abdominal pain.  Genitourinary:  Negative for difficulty urinating.  Skin:  Negative for  rash.  Allergic/Immunologic: Positive for environmental allergies. Negative for food allergies.  Neurological:  Positive for headaches.   Objective: BP 102/66   Pulse 66   Temp 98.2 F (36.8 C) (Temporal)   Resp 20   Ht  (1.676 m)   Wt 136 lb (61.7 kg)  SpO2 99%   BMI 21.95 kg/m  Body mass index is 21.95 kg/m. Physical Exam Vitals and nursing note reviewed.  Constitutional:      Appearance: Normal appearance. She is well-developed.  HENT:     Head: Normocephalic and atraumatic.     Right Ear: Tympanic membrane and external ear normal.     Left Ear: Tympanic membrane and external ear normal.     Nose: Nose normal.     Mouth/Throat:     Mouth: Mucous membranes are moist.     Pharynx: Oropharynx is clear.  Eyes:     Conjunctiva/sclera: Conjunctivae normal.  Cardiovascular:     Rate and Rhythm: Normal rate and regular rhythm.     Heart sounds: Normal heart sounds. No murmur heard.   No friction rub. No gallop.  Pulmonary:     Effort: Pulmonary effort is normal.     Breath sounds: Normal breath sounds. No wheezing, rhonchi or rales.  Musculoskeletal:     Cervical back: Neck supple.  Skin:    General: Skin is warm.     Findings: No rash.  Neurological:     Mental Status: She is alert and oriented to person, place, and time.  Psychiatric:        Behavior: Behavior normal.   The plan was reviewed with the patient/family, and all questions/concerned were addressed.  It was my pleasure to see Amayia today and participate in her care. Please feel free to contact me with any questions or concerns.  Sincerely,  Wyline Mood, DO Allergy & Immunology  [facility] office: [phone] [facility] office: [phone]

## 2021-06-21 NOTE — ED Provider Notes (Signed)
[facility]    CSN: [id number] Arrival date & time: 06/21/21  0807      History   Chief Complaint Chief Complaint  Patient presents with   Vaginitis    HPI Kimberly Harper is a 35 y.o. female.   Patient presents with clear to white vaginal discharge.  Patient was seen on 06/10/2021 for vaginal discharge and discomfort.  Cervicovaginal swab was positive for bacterial vaginosis.  Patient was treated with metronidazole and took medication as prescribed.  Patient reports that vaginal odor has resolved but vaginal discharge has become "thicker" and has changed in color to more of a white color.  Patient is concerned for yeast infection that was caused by antibiotic.  Patient reports that she has had yeast infections before and they have presented similarly.  Patient denies any exposure to STD.  Denies urinary burning, urinary frequency, pelvic pain, back pain, fever, hematuria, irregular vaginal bleeding.  Patient does endorse 1 episode of lower abdominal cramping but attributes this to standing up too quickly.  Denies any current abdominal pain.  Last menstrual cycle was approximately 1 month ago per patient.  Patient also attributes abdominal cramping to start of menstrual cycle.    Past Medical History:  Diagnosis Date   Alcoholic ([place])    Anemia    [employer] 01/17/2020   Anxiety    Asthma    Bipolar 1 disorder ([place])    Blood transfusion without reported diagnosis    [employer] 01/17/2020   Chronic headaches    Depression    [employer] 01/17/2020   Major depressive disorder    Substance abuse ([place])    [employer] 01/17/2020    Patient Active Problem List   Diagnosis Date Noted   Panic disorder 04/12/2020   GAD (generalized anxiety disorder) 03/18/2020   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   Borderline personality disorder ([place]) 02/06/2020   Bipolar 2 disorder, major depressive episode ([place]) 12/06/2019    Past Surgical History:  Procedure Laterality Date    CESAREAN SECTION N/A    [employer] 01/17/2020   DILATION AND CURETTAGE OF UTERUS     HAND SURGERY Right    WISDOM TOOTH EXTRACTION      OB History   No obstetric history on file.      Home Medications    Prior to Admission medications   Medication Sig Start Date End Date Taking? Authorizing Provider  fluconazole (DIFLUCAN) 150 MG tablet Take 1 tablet (150 mg total) by mouth every 3 (three) days. May take second pill in 3 days if no resolution of symptoms with first pill 06/21/21  Yes , [place] E, FNP  famotidine (PEPCID) 20 MG tablet Take 1 tablet (20 mg total) by mouth 2 (two) times daily. 05/19/21   Maximiano Coss, NP  fluticasone (FLONASE) 50 MCG/ACT nasal spray Place 2 sprays into both nostrils daily. Patient not taking: No sig reported 04/19/21   Jaynee Eagles, PA-C  Galcanezumab-gnlm [facility]) 120 MG/ML SOAJ Inject 120 mg into the skin every 28 (twenty-eight) days. Patient not taking: Reported on 05/19/2021 04/26/21   Pieter Partridge, DO  levocetirizine (XYZAL) 5 MG tablet Take 1 tablet (5 mg total) by mouth every evening. 04/19/21   Jaynee Eagles, PA-C  naproxen (NAPROSYN) 500 MG tablet Take 1 tablet (500 mg total) by mouth 2 (two) times daily. Patient not taking: Reported on 04/12/2021 12/05/20   Wieters, Madelynn Done C, PA-C  omeprazole (PRILOSEC) 40 MG capsule Take 1 capsule twice a day for 12 weeks then take 1  capsule daily 01/07/21   Tressia Danas, MD  ondansetron (ZOFRAN) 4 MG tablet Take 1 tablet (4 mg total) by mouth every 8 (eight) hours as needed for nausea or vomiting. 04/13/21   Janeece Agee, NP  ondansetron (ZOFRAN-ODT) 4 MG disintegrating tablet Take by mouth. Patient not taking: No sig reported 01/17/21   [provider]  promethazine (PHENERGAN) 25 MG tablet Take 25 mg by mouth every 6 (six) hours as needed. Patient not taking: No sig reported 01/17/21   [provider]  propranolol (INDERAL) 20 MG tablet Take 1 tablet (20 mg total) by mouth daily.  04/13/21   Janeece Agee, NP  pseudoephedrine (SUDAFED) 30 MG tablet Take 1 tablet (30 mg total) by mouth every 8 (eight) hours as needed for congestion. 04/19/21   Wallis Bamberg, PA-C  sucralfate (CARAFATE) 1 GM/10ML suspension Take 10 mLs (1 g total) by mouth 4 (four) times daily -  with meals and at bedtime. 05/19/21   Janeece Agee, NP  traZODone (DESYREL) 100 MG tablet TAKE 1 TABLET(100 MG) BY MOUTH AT BEDTIME AS NEEDED FOR SLEEP 01/20/21   Janeece Agee, NP  Vitamin D, Ergocalciferol, (DRISDOL) 1.25 MG (50000 UNIT) CAPS capsule TAKE 1 CAPSULE BY MOUTH EVERY 7 DAYS 04/16/21   Janeece Agee, NP  montelukast (SINGULAIR) 10 MG tablet Take 1 tablet (10 mg total) by mouth at bedtime. 01/19/20 02/07/20  Janeece Agee, NP    Family History Family History  Problem Relation Age of Onset   Hypertension Mother    Diabetes Mother    Stroke Mother    Depression Mother    Depression Sister    Irritable bowel syndrome Sister    Migraines Brother    Depression Brother    Stroke Maternal Grandmother    Diabetes Maternal Grandmother    Alcohol abuse Maternal Grandfather    Stroke Paternal Grandmother    Breast cancer Paternal Grandmother    Alcohol abuse Paternal Grandfather     Social History Social History   Tobacco Use   Smoking status: Former    Types: Cigarettes    Quit date: 09/07/2006    Years since quitting: 14.7   Smokeless tobacco: Never  Vaping Use   Vaping Use: Never used  Substance Use Topics   Alcohol use: Yes    Comment: now soically   Drug use: Not Currently    Types: Marijuana     Allergies   Multiple vitamins-iron   Review of Systems Review of Systems Per HPI  Physical Exam Triage Vital Signs ED Triage Vitals [06/21/21 0830]  Enc Vitals Group     BP 132/82     Pulse Rate 65     Resp 18     Temp 98.1 F (36.7 C)     Temp Source Oral     SpO2 99 %     Weight      Height      Head Circumference      Peak Flow      Pain Score 0     Pain Loc       Pain Edu?      Excl. in GC?    No data found.  Updated Vital Signs BP 132/82 (BP Location: Left Arm)   Pulse 65   Temp 98.1 F (36.7 C) (Oral)   Resp 18   LMP  (Approximate)   SpO2 99%   Visual Acuity Right Eye Distance:   Left Eye Distance:   Bilateral Distance:  Right Eye Near:   Left Eye Near:    Bilateral Near:     Physical Exam Constitutional:      General: She is not in acute distress.    Appearance: Normal appearance. She is not toxic-appearing or diaphoretic.  HENT:     Head: Normocephalic and atraumatic.  Eyes:     Extraocular Movements: Extraocular movements intact.     Conjunctiva/sclera: Conjunctivae normal.  Cardiovascular:     Rate and Rhythm: Normal rate and regular rhythm.     Pulses: Normal pulses.     Heart sounds: Normal heart sounds.  Pulmonary:     Effort: Pulmonary effort is normal.     Breath sounds: Normal breath sounds.  Abdominal:     General: Abdomen is flat. Bowel sounds are normal. There is no distension.     Palpations: Abdomen is soft.     Tenderness: There is no abdominal tenderness.  Genitourinary:    Comments: Patient declined pelvic exam.  Self swab performed. Neurological:     General: No focal deficit present.     Mental Status: She is alert and oriented to person, place, and time. Mental status is at baseline.  Psychiatric:        Mood and Affect: Mood normal.        Behavior: Behavior normal.        Thought Content: Thought content normal.        Judgment: Judgment normal.     UC Treatments / Results  Labs (all labs ordered are listed, but only abnormal results are displayed) Labs Reviewed  POCT URINALYSIS DIP (MANUAL ENTRY) - Abnormal; Notable for the following components:      Result Value   Clarity, UA hazy (*)    Blood, UA trace-intact (*)    All other components within normal limits  CERVICOVAGINAL ANCILLARY ONLY    EKG   Radiology No results found.  Procedures Procedures (including critical care  time)  Medications Ordered in UC Medications - No data to display  Initial Impression / Assessment and Plan / UC Course  I have reviewed the triage vital signs and the nursing notes.  Pertinent labs & imaging results that were available during my care of the patient were reviewed by me and considered in my medical decision making (see chart for details).     Suggested pelvic exam to rule out pelvic inflammatory disease due to possibility of persistent bacterial vaginosis, but the patient declined.  Risks associated with not doing pelvic exam were discussed with patient.  There is low suspicion for pelvic inflammatory disease given that only symptom is a thicker vaginal discharge and patient does not have any pelvic pain, pain with intercourse, irregular vaginal bleeding, postcoital bleeding, abdominal pain.  Highly suspicious for vaginal yeast infection that resulted from recent antibiotic use.  Will treat with Diflucan.  Vaginal swab pending.  Discussed strict return precautions for patient.  Patient verbalized understanding and was agreeable with plan. Final Clinical Impressions(s) / UC Diagnoses   Final diagnoses:  Vaginal discharge     Discharge Instructions      Vaginal swab is pending.  We will call with results and treat as appropriate.  Please follow-up if symptoms worsen or persist.  You have been prescribed Diflucan to help treat possible yeast infection.    ED Prescriptions     Medication Sig Dispense Auth. Provider   fluconazole (DIFLUCAN) 150 MG tablet Take 1 tablet (150 mg total) by mouth every 3 (three) days.  May take second pill in 3 days if no resolution of symptoms with first pill 2 tablet We[place], Michele Rockers, [place]      PDMP not reviewed this encounter.   Teodora Medici, [place] 06/21/21 [phone]

## 2021-06-21 NOTE — Discharge Instructions (Signed)
Vaginal swab is pending.  We will call with results and treat as appropriate.  Please follow-up if symptoms worsen or persist.  You have been prescribed Diflucan to help treat possible yeast infection.

## 2021-06-21 NOTE — ED Triage Notes (Signed)
Pt c/o vaginal discharge (thick, clear), vaginal dryness, lower pelvic pain.   Denies vaginal odor, itching, burning, lower back pain, frequency, oliguria, hematuria.   Onset 2-3 days ago. States she recently had abx.

## 2021-06-22 ENCOUNTER — Encounter: Payer: Self-pay | Admitting: Allergy

## 2021-06-22 ENCOUNTER — Ambulatory Visit (INDEPENDENT_AMBULATORY_CARE_PROVIDER_SITE_OTHER): Payer: Medicare Other | Admitting: Allergy

## 2021-06-22 VITALS — BP 102/66 | HR 66 | Temp 98.2°F | Resp 20 | Ht 66.0 in | Wt 136.0 lb

## 2021-06-22 DIAGNOSIS — J45909 Unspecified asthma, uncomplicated: Secondary | ICD-10-CM | POA: Diagnosis not present

## 2021-06-22 DIAGNOSIS — J3089 Other allergic rhinitis: Secondary | ICD-10-CM | POA: Diagnosis not present

## 2021-06-22 DIAGNOSIS — R12 Heartburn: Secondary | ICD-10-CM | POA: Diagnosis not present

## 2021-06-22 HISTORY — DX: Unspecified asthma, uncomplicated: J45.909

## 2021-06-22 LAB — CERVICOVAGINAL ANCILLARY ONLY
Bacterial Vaginitis (gardnerella): NEGATIVE
Candida Glabrata: NEGATIVE
Candida Vaginitis: NEGATIVE
Comment: NEGATIVE
Comment: NEGATIVE
Comment: NEGATIVE

## 2021-06-22 MED ORDER — AZELASTINE HCL 0.1 % NA SOLN: 1.0000 | Freq: Two times a day (BID) | NASAL | 5 refills | Status: DC | PRN

## 2021-06-22 MED ORDER — LEVOCETIRIZINE DIHYDROCHLORIDE 5 MG PO TABS: 5.0000 mg | ORAL_TABLET | Freq: Every evening | ORAL | 3 refills | Status: DC

## 2021-06-22 MED ORDER — FLUTICASONE PROPIONATE 50 MCG/ACT NA SUSP: 1.0000 | Freq: Two times a day (BID) | NASAL | 5 refills | Status: DC | PRN

## 2021-06-22 MED ORDER — FLUTICASONE PROPIONATE HFA 110 MCG/ACT IN AERO: 2.0000 | INHALATION_SPRAY | Freq: Two times a day (BID) | RESPIRATORY_TRACT | 3 refills | Status: DC

## 2021-06-22 NOTE — Assessment & Plan Note (Signed)
   Continue famotidine 20 mg twice a day.  Continue Prilosec 40 mg once a day. See handout for lifestyle and dietary modifications.

## 2021-06-22 NOTE — Assessment & Plan Note (Signed)
Asthma as a child and had a flare a few years ago.  Denies any active symptoms.  Today spirometry showed some mild obstruction with 17% improvement in FEV1 post bronchodilator treatment.  Clinically feeling unchanged.  Given her other clinical symptoms will do a 2 month trial of daily controller medication. . Daily controller medication(s): START Flovent 2 puffs twice a day with spacer and rinse mouth afterwards. . May use albuterol rescue inhaler 2 puffs every 4 to 6 hours as needed for shortness of breath, chest tightness, coughing, and wheezing. May use albuterol rescue inhaler 2 puffs 5 to 15 minutes prior to strenuous physical activities. Monitor frequency of use.  . Get spirometry at next visit.

## 2021-06-22 NOTE — Assessment & Plan Note (Signed)
Coughing with mucus, postnasal drip, sinus pressure for the last 4 months.  Concerned about mold allergy as she had mold issues in the apartment which was remediated.  Used Flonase for a few times with unknown benefit.  History of migraines.  No prior allergy evaluation.  Today's skin prick testing showed: Positive to grass, ragweed, weed, trees, dust mites, cat, dog. Negative to mold and cockroach. Negative to common foods.  Discussed with patient that I'm not sure how much of the above allergens are causing her current symptoms.   Start environmental control measures as below.  Start Xyzal (levocetirizine) daily as needed. May take twice a day during allergy flares.  . Use Flonase (fluticasone) nasal spray 1 spray per nostril twice a day as needed for nasal congestion.   Use azelastine nasal spray 1-2 sprays per nostril twice a day as needed for runny nose/drainage.  Nasal saline spray (i.e., Simply Saline) or nasal saline lavage (i.e., NeilMed) is recommended as needed and prior to medicated nasal sprays.  If no improvement will refer to ENT next.

## 2021-06-22 NOTE — Patient Instructions (Addendum)
Today's skin prick testing showed: Positive to grass, ragweed, weed, trees, dust mites, cat, dog. Negative to mold and cockroach. Negative to common foods. Results given.  Environmental allergies Start environmental control measures as below. Start Xyzal (levocetirizine) daily as needed. May take twice a day during allergy flares.  Use Flonase (fluticasone) nasal spray 1 spray per nostril twice a day as needed for nasal congestion.  Use azelastine nasal spray 1-2 sprays per nostril twice a day as needed for runny nose/drainage. Nasal saline spray (i.e., Simply Saline) or nasal saline lavage (i.e., NeilMed) is recommended as needed and prior to medicated nasal sprays.  Asthma: Daily controller medication(s): START Flovent 2 puffs twice a day with spacer and rinse mouth afterwards. May use albuterol rescue inhaler 2 puffs every 4 to 6 hours as needed for shortness of breath, chest tightness, coughing, and wheezing. May use albuterol rescue inhaler 2 puffs 5 to 15 minutes prior to strenuous physical activities. Monitor frequency of use.  Asthma control goals:  Full participation in all desired activities (may need albuterol before activity) Albuterol use two times or less a week on average (not counting use with activity) Cough interfering with sleep two times or less a month Oral steroids no more than once a year No hospitalizations   Follow up in 2 months or sooner if needed.    Reducing Pollen Exposure Pollen seasons: trees (spring), grass (summer) and ragweed/weeds (fall). Keep windows closed in your home and car to lower pollen exposure.  Install air conditioning in the bedroom and throughout the house if possible.  Avoid going out in dry windy days - especially early morning. Pollen counts are highest between 5 - 10 AM and on dry, hot and windy days.  Save outside activities for late afternoon or after a heavy rain, when pollen levels are lower.  Avoid mowing of grass if you  have grass pollen allergy. Be aware that pollen can also be transported indoors on people and pets.  Dry your clothes in an automatic dryer rather than hanging them outside where they might collect pollen.  Rinse hair and eyes before bedtime. Control of House Dust Mite Allergen Dust mite allergens are a common trigger of allergy and asthma symptoms. While they can be found throughout the house, these microscopic creatures thrive in warm, humid environments such as bedding, upholstered furniture and carpeting. Because so much time is spent in the bedroom, it is essential to reduce mite levels there.  Encase pillows, mattresses, and box springs in special allergen-proof fabric covers or airtight, zippered plastic covers.  Bedding should be washed weekly in hot water (130 F) and dried in a hot dryer. Allergen-proof covers are available for comforters and pillows that can't be regularly washed.  Wash the allergy-proof covers every few months. Minimize clutter in the bedroom. Keep pets out of the bedroom.  Keep humidity less than 50% by using a dehumidifier or air conditioning. You can buy a humidity measuring device called a hygrometer to monitor this.  If possible, replace carpets with hardwood, linoleum, or washable area rugs. If that's not possible, vacuum frequently with a vacuum that has a HEPA filter. Remove all upholstered furniture and non-washable window drapes from the bedroom. Remove all non-washable stuffed toys from the bedroom.  Wash stuffed toys weekly. Pet Allergen Avoidance: Contrary to popular opinion, there are no "hypoallergenic" breeds of dogs or cats. That is because people are not allergic to an animal's hair, but to an allergen found in the animal's  saliva, dander (dead skin flakes) or urine. Pet allergy symptoms typically occur within minutes. For some people, symptoms can build up and become most severe 8 to 12 hours after contact with the animal. People with severe allergies  can experience reactions in public places if dander has been transported on the pet owners' clothing. Keeping an animal outdoors is only a partial solution, since homes with pets in the yard still have higher concentrations of animal allergens. Before getting a pet, ask your allergist to determine if you are allergic to animals. If your pet is already considered part of your family, try to minimize contact and keep the pet out of the bedroom and other rooms where you spend a great deal of time. As with dust mites, vacuum carpets often or replace carpet with a hardwood floor, tile or linoleum. High-efficiency particulate air (HEPA) cleaners can reduce allergen levels over time. While dander and saliva are the source of cat and dog allergens, urine is the source of allergens from rabbits, hamsters, mice and [place] pigs; so ask a non-allergic family member to clean the animal's cage. If you have a pet allergy, talk to your allergist about the potential for allergy immunotherapy (allergy shots). This strategy can often provide long-term relief.

## 2021-06-27 ENCOUNTER — Other Ambulatory Visit: Payer: Self-pay | Admitting: Registered Nurse

## 2021-06-27 DIAGNOSIS — E559 Vitamin D deficiency, unspecified: Secondary | ICD-10-CM

## 2021-06-28 ENCOUNTER — Encounter: Payer: Self-pay | Admitting: Gastroenterology

## 2021-06-28 ENCOUNTER — Ambulatory Visit (INDEPENDENT_AMBULATORY_CARE_PROVIDER_SITE_OTHER): Payer: Medicare Other | Admitting: Gastroenterology

## 2021-06-28 VITALS — BP 110/68 | HR 68 | Ht 66.0 in | Wt 136.8 lb

## 2021-06-28 DIAGNOSIS — R109 Unspecified abdominal pain: Secondary | ICD-10-CM

## 2021-06-28 DIAGNOSIS — R634 Abnormal weight loss: Secondary | ICD-10-CM

## 2021-06-28 NOTE — Patient Instructions (Signed)
If you are age 37 or younger, your body mass index should be between 19-25. Your Body mass index is 22.08 kg/m. If this is out of the aformentioned range listed, please consider follow up with your Primary Care Provider.  ________________________________________________________  The [facility] providers would like to encourage you to use [facility] to communicate with providers for non-urgent requests or questions.  Due to long hold times on the telephone, sending your provider a message by [facility] may be a faster and more efficient way to get a response.  Please allow 48 business hours for a response.  Please remember that this is for non-urgent requests.  _______________________________________________________  Kimberly Harper have been scheduled for a CT scan of the abdomen and pelvis at [facility], 1st floor Radiology. You are scheduled on 07/07/2021  at 4:30. You should arrive 15 minutes prior to your appointment time for registration.  Please pick up 2 bottles of contrast from [facility] at least 3 days prior to your scan. The solution may taste better if refrigerated, but do NOT add ice or any other liquid to this solution. Shake well before drinking.   Please follow the written instructions below on the day of your exam:   1) Do not eat anything after 12:30 pm (4 hours prior to your test)   2) Drink 1 bottle of contrast @ 2:30 pm (2 hours prior to your exam)  Remember to shake well before drinking and do NOT pour over ice.     Drink 1 bottle of contrast @ 3:30 pm (1 hour prior to your exam)   You may take any medications as prescribed with a small amount of water, if necessary. If you take any of the following medications: METFORMIN, GLUCOPHAGE, GLUCOVANCE, AVANDAMET, RIOMET, FORTAMET, [place] MET, JANUMET, GLUMETZA or METAGLIP, you MAY be asked to HOLD this medication 48 hours AFTER the exam.   The purpose of you drinking the oral contrast is to aid in the visualization of your intestinal  tract. The contrast solution may cause some diarrhea. Depending on your individual set of symptoms, you may also receive an intravenous injection of x-ray contrast/dye. Plan on being at [facility] for 45 minutes or longer, depending on the type of exam you are having performed.   If you have any questions regarding your exam or if you need to reschedule, you may call [facility] at [phone] between the hours of 8:00 am and 5:00 pm, Monday-Friday.   I did not change your medications today. I recommend that you continue to take omeprazole 40 mg twice daily and the famotidine 20 mg twice daily. I also recommend that you pick up your Carafate prescription and start using that four times daily.  Please continue to avoid all non-steroidal anti-inflammatory medications.   I have recommended a CT of the abd/pelvis to finish the evaluation of your GI tract for causes of nausea.  Treating your anxiety may also improve your nausea. I hope that you can work with your psychiatrist to adjust your medications to get you feeling better.

## 2021-06-28 NOTE — Progress Notes (Signed)
Referring Provider: Janeece Agee, NP Primary Care Physician:  Janeece Agee, NP  Chief complaint:  Abdominal pain, nausea, anorexia   IMPRESSION:  Abdominal pain, nausea, anorexia Unintentional weight loss Recent concerns about mold  GI symptoms may be manifestation of her anxiety or potentially worsened by medications. Not explained by ultrasound. EGD revealed duodenitis and she has been on PPI therapy since that time.   PLAN: - Obtain records from [facility] 2018 in [place], [place] - Continue omeprazole 40 BID - Continue to use famotidine 20 mg BID - Add Carafate 1 g QID - prescribed by her PCP but not yet filled - CT abd/pelvis with contrast - Follow-up with psychiatrist recommended as I still think that control of her anxiety may provide the best relief of GI symptoms  Please see the "Patient Instructions" section for addition details about the plan.  HPI: Kimberly Harper is a 35 y.o. female initially referred by NP [facility] for further evaluation of abdominal pain.   She has bipolar 1 and 2 disorder, major depressive disorder, generalized anxiety disorder, borderline personality disorder, panic disorder, history of suicidal ideation and attempted overdose overdose.  She also has a history of body dysmorphia.   GI symptoms initially developed in July and August during a time of poorly controlled anxiety that manifests as anorexia, nausea, post-prandial diarrhea, and some vomiting. Experienced abdominal cramping with any food intake.  Has been able to tolerated eating since starting Zofran. She specifically asked about having a parasite because she is also hungry immediately after eating.   Symptoms previously evaluated in [facility] including a colonoscopy, abdominal imaging and some stimulation tests in 2018. She doesn't remember getting a response or any conclusive results.   Labs 03/19/20 normal lipase, amylase, CMP, CK  Abdominal ultrasound 09/16/20 showed  mild increased liver parenchymal echogenicity, but no acute changes  EGD 01/04/21 showed fundic gland polyps, normal gastric biopsies, and duodenitis. Esophageal biopsies were normal.   She found that dicyclomine caused nausea. Ondansetron provides relief of nausea.   She returns today in follow-up. Taking omeprazole twice daily has improved her symptoms. Failed taper to QD dosing due to recurrent symptoms.  At the end of August she has had an extensive mold exposure and awakes with nausea and poor appetite. She doesn't find the famotidine is helpful.   Having a increase in frequency of headaches.  Notes an increase in anxiety and stress with a lot of significant unknowns in her life.  Her psychiatrist has prescribed Mirapex but she hasn't been taking it every day. She is afraid to take it for so long that her depression will come back. She is not sure the nausea is better with the new medications.      Past Medical History:  Diagnosis Date   Alcoholic (HCC)    Anemia    [facility] 01/17/2020   Anxiety    Asthma    Bipolar 1 disorder (HCC)    Blood transfusion without reported diagnosis    [facility] 01/17/2020   Chronic headaches    Depression    [facility] 01/17/2020   Major depressive disorder    Substance abuse (HCC)    [facility] 01/17/2020    Past Surgical History:  Procedure Laterality Date   CESAREAN SECTION N/A    [facility] 01/17/2020   COLONOSCOPY     earlier in year 2022   DILATION AND CURETTAGE OF UTERUS     HAND SURGERY Right    WISDOM TOOTH EXTRACTION  Current Outpatient Medications  Medication Sig Dispense Refill   ALPRAZolam (XANAX) 1 MG tablet Take 1 mg by mouth 2 (two) times daily as needed.     famotidine (PEPCID) 20 MG tablet Take 1 tablet (20 mg total) by mouth 2 (two) times daily. 90 tablet 0   levocetirizine (XYZAL) 5 MG tablet Take 1 tablet (5 mg total) by mouth every evening. 30 tablet 3   omeprazole (PRILOSEC) 40 MG capsule Take 1 capsule twice a  day for 12 weeks then take 1 capsule daily 60 capsule 6   ondansetron (ZOFRAN) 4 MG tablet Take 1 tablet (4 mg total) by mouth every 8 (eight) hours as needed for nausea or vomiting. 50 tablet 0   pramipexole (MIRAPEX) 0.25 MG tablet Take by mouth.     propranolol (INDERAL) 20 MG tablet Take 1 tablet (20 mg total) by mouth daily. 90 tablet 0   pseudoephedrine (SUDAFED) 30 MG tablet Take 1 tablet (30 mg total) by mouth every 8 (eight) hours as needed for congestion. 30 tablet 0   traZODone (DESYREL) 100 MG tablet TAKE 1 TABLET(100 MG) BY MOUTH AT BEDTIME AS NEEDED FOR SLEEP 90 tablet 0   azelastine (ASTELIN) 0.1 % nasal spray Place 1-2 sprays into both nostrils 2 (two) times daily as needed (nasal drainage). Use in each nostril as directed (Patient not taking: Reported on 06/28/2021) 30 mL 5   fluticasone (FLONASE) 50 MCG/ACT nasal spray Place 1 spray into both nostrils 2 (two) times daily as needed (nasal congestion). (Patient not taking: Reported on 06/28/2021) 16 g 5   fluticasone (FLOVENT HFA) 110 MCG/ACT inhaler Inhale 2 puffs into the lungs in the morning and at bedtime. with spacer and rinse mouth afterwards. (Patient not taking: Reported on 06/28/2021) 1 each 3   sucralfate (CARAFATE) 1 GM/10ML suspension Take 10 mLs (1 g total) by mouth 4 (four) times daily -  with meals and at bedtime. (Patient not taking: Reported on 06/22/2021) 420 mL 0   No current facility-administered medications for this visit.    Allergies as of 06/28/2021 - Review Complete 06/28/2021  Allergen Reaction Noted   Multiple vitamins-iron Swelling 12/07/2019      Physical Exam: General:   Alert,  well-nourished, pleasant and cooperative in NAD. She is waring her sunglasses because she finds the lights very bright in our clinic Weight today 136 pounds Weight 09/07/20 143 pounds Head:  Normocephalic and atraumatic. Eyes:  Sclera clear, no icterus.   Conjunctiva pink. Abdomen:  Soft, nontender, nondistended, normal  bowel sounds, no rebound or guarding. No hepatosplenomegaly.   Neurologic:  Alert and  oriented x4;  grossly nonfocal Skin:  Intact without significant lesions or rashes. Psych:  Alert and cooperative. Normal mood and affect.     Hendrixx Severin L. Orvan Falconer, MD, MPH 06/30/2021, 9:41 PM

## 2021-06-30 ENCOUNTER — Encounter: Payer: Self-pay | Admitting: Gastroenterology

## 2021-07-06 ENCOUNTER — Other Ambulatory Visit: Payer: Self-pay | Admitting: Registered Nurse

## 2021-07-06 DIAGNOSIS — R0989 Other specified symptoms and signs involving the circulatory and respiratory systems: Secondary | ICD-10-CM

## 2021-07-07 ENCOUNTER — Ambulatory Visit (HOSPITAL_COMMUNITY): Admission: RE | Admit: 2021-07-07 | Payer: Medicare Other | Source: Ambulatory Visit

## 2021-07-13 ENCOUNTER — Other Ambulatory Visit: Payer: Self-pay | Admitting: Registered Nurse

## 2021-07-13 DIAGNOSIS — F410 Panic disorder [episodic paroxysmal anxiety] without agoraphobia: Secondary | ICD-10-CM

## 2021-07-13 DIAGNOSIS — G43829 Menstrual migraine, not intractable, without status migrainosus: Secondary | ICD-10-CM

## 2021-07-13 DIAGNOSIS — F411 Generalized anxiety disorder: Secondary | ICD-10-CM

## 2021-07-15 ENCOUNTER — Ambulatory Visit (HOSPITAL_COMMUNITY): Admission: RE | Admit: 2021-07-15 | Payer: Medicare Other | Source: Ambulatory Visit

## 2021-07-16 ENCOUNTER — Encounter: Payer: Self-pay | Admitting: Registered Nurse

## 2021-07-26 ENCOUNTER — Telehealth (HOSPITAL_COMMUNITY): Payer: Self-pay | Admitting: Psychiatry

## 2021-07-26 NOTE — Telephone Encounter (Addendum)
D:  Patient phoned stating she was interested in re-enrolling into [facility].  A:  Re-oriented pt.  Answered her questions.  Encouraged pt to verify her benefits with her insurance company.  Patient is requesting to start next week (Jan. 9th).  R:  Pt receptive.

## 2021-07-29 ENCOUNTER — Ambulatory Visit (HOSPITAL_COMMUNITY)
Admission: RE | Admit: 2021-07-29 | Discharge: 2021-07-29 | Disposition: A | Discharge status: Home / Self Care | Payer: Medicare Other | Source: Ambulatory Visit | Attending: Gastroenterology | Admitting: Gastroenterology

## 2021-07-29 ENCOUNTER — Other Ambulatory Visit: Payer: Self-pay

## 2021-07-29 DIAGNOSIS — R109 Unspecified abdominal pain: Secondary | ICD-10-CM | POA: Insufficient documentation

## 2021-07-29 DIAGNOSIS — R634 Abnormal weight loss: Secondary | ICD-10-CM | POA: Diagnosis present

## 2021-07-29 MED ORDER — IOHEXOL 350 MG/ML SOLN
80.0000 mL | Freq: Once | INTRAVENOUS | Status: AC | PRN
  Administered 2021-07-29: 80 mL via INTRAVENOUS

## 2021-08-01 ENCOUNTER — Other Ambulatory Visit: Payer: Self-pay | Admitting: Registered Nurse

## 2021-08-01 ENCOUNTER — Encounter (HOSPITAL_COMMUNITY): Payer: Self-pay | Admitting: Licensed Clinical Social Worker

## 2021-08-01 ENCOUNTER — Other Ambulatory Visit (HOSPITAL_COMMUNITY): Payer: Medicare Other | Attending: Psychiatry | Admitting: Psychiatry

## 2021-08-01 ENCOUNTER — Other Ambulatory Visit: Payer: Self-pay

## 2021-08-01 DIAGNOSIS — F332 Major depressive disorder, recurrent severe without psychotic features: Secondary | ICD-10-CM | POA: Diagnosis present

## 2021-08-01 DIAGNOSIS — R1084 Generalized abdominal pain: Secondary | ICD-10-CM

## 2021-08-01 DIAGNOSIS — R1114 Bilious vomiting: Secondary | ICD-10-CM

## 2021-08-01 MED ORDER — ONDANSETRON HCL 4 MG PO TABS: 4.0000 mg | ORAL_TABLET | Freq: Three times a day (TID) | ORAL | 0 refills | Status: DC | PRN
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
## 2021-08-01 NOTE — Telephone Encounter (Signed)
Patient is requesting a refill of the following medications: Requested Prescriptions   Pending Prescriptions Disp Refills   ondansetron (ZOFRAN) 4 MG tablet 50 tablet 0    Sig: Take 1 tablet (4 mg total) by mouth every 8 (eight) hours as needed for nausea or vomiting.    Date of patient request: 08/01/21 Last office visit: 05/19/21 Date of last refill: 04/13/21 Last refill amount: 50

## 2021-08-02 ENCOUNTER — Other Ambulatory Visit (HOSPITAL_COMMUNITY): Payer: Medicare Other | Admitting: Licensed Clinical Social Worker

## 2021-08-02 ENCOUNTER — Other Ambulatory Visit: Payer: Self-pay

## 2021-08-02 DIAGNOSIS — R109 Unspecified abdominal pain: Secondary | ICD-10-CM

## 2021-08-02 DIAGNOSIS — F332 Major depressive disorder, recurrent severe without psychotic features: Secondary | ICD-10-CM

## 2021-08-02 DIAGNOSIS — K769 Liver disease, unspecified: Secondary | ICD-10-CM

## 2021-08-02 MED ORDER — LINACLOTIDE 145 MCG PO CAPS: 145.0000 ug | ORAL_CAPSULE | Freq: Every day | ORAL | 0 refills | Status: DC

## 2021-08-02 NOTE — Progress Notes (Signed)
Virtual Visit via Video Note   I connected with Clovis Fredrickson on 08/02/21 at  9:00 AM EDT by a video enabled telemedicine application and verified that I am speaking with the correct person using two identifiers.   At orientation to the IOP program, Case Manager discussed the limitations of evaluation and management by telemedicine and the availability of in person appointments. The patient expressed understanding and agreed to proceed with virtual visits throughout the duration of the program.   Location:  Patient: Patient Home Provider: [facility]   History of Present Illness: MDD    Observations/Objective: Check In: Case Manager checked in with all participants to review discharge dates, insurance authorizations, work-related documents and needs from the treatment team regarding medications. Harlean stated needs and engaged in discussion.    Initial Therapeutic Activity: Counselor facilitated a check-in with Masae to assess for safety, sobriety and medication compliance.  Counselor also inquired about Melonee's current emotional ratings, as well as any significant changes in thoughts, feelings or behavior since previous check in.  Zaiah presented for session on time and was alert, oriented x5, with no evidence or self-report of active SI/HI or A/V H.  Leshae reported compliance with medication and denied use of alcohol or illicit substances.  Annica reported scores of 5/10 for depression, 8/10 for anxiety, and 8/10 for anger/irritability.  Loyce denied any recent outbursts or panic attacks.  Lilyrose reported that a recent success was making it to work on time yesterday, stating It went fine.  Kasia reported that a recent struggle has been dealing with relationship problems, stating We talked about some things, but he always seems so busy.  Gennifer reported that her goal is to discuss engagement in couples counseling with her partner as a potential solution for ongoing  issues between them.      Second Therapeutic Activity: Counselor introduced [employer], [place] Chaplain to provide psychoeducation on topic of Grief and Loss with members today.  Marchelle Folks began discussion by checking in with the group about their baseline mood today, general thoughts on what grief means to them and how it has affected them personally in the past.  Marchelle Folks provided information on how the process of grief/loss can differ depending upon one's unique culture, and categories of loss one could experience (i.e. loss of a person, animal, relationship, job, identity, etc).  Marchelle Folks encouraged members to be mindful of how pervasive loss can be, and how to recognize signs which could indicate that this is having an impact on one's overall mental health and wellbeing.  Intervention was effective, as evidenced by Casia participating in discussion with speaker on the subject, reporting that she has experienced several types of loss over the past year which prompted her seeking group therapy, including loss of financial, mental, employment, and housing stability.  Robynn reported that she hopes time in [facility] will help her process these losses, explore realistic solutions to her current problems, and move forward with her life, stating I want the new me to be happier, not worry about medications anymore, and get back to work.    Third Therapeutic Activity: Counselor discussed topic of gratitude journaling with members as a form of self-care.  Counselor virtually shared a handout with the group today which explained the benefits of this practice, including reduction in stress, increased happiness, and self-esteem.  Tips were also provided to aid in practice, such as taking time with entries, writing about people one is grateful for, and setting goal for  two entries per week for at least 10-20 minutes at a time.  Counselor also provided group members with a variety of journaling prompts to choose from  today, and encouraged each member to take time to write about something they are grateful for, with examples such as Something beautiful I recently saw was., Something I can be proud of is, A reason to be excited for the future is and more.  Members were encouraged to share their entry with the group, along with their perspective on the activity and motivation level towards making this a habit.  Intervention was effective, as evidenced by Nyriah participating in journaling activity by choosing the prompts An unexpected good thing that happened and A reason to be excited for the future.  Fusaye reported that it was unexpected how well the conversation went with her partner yesterday evening, stating He was willing to talk with me and share his feelings for once.  I was able to go more in depth about how I feel too and whats been going on with me.  Tayia reported that she is excited for the future because she recently moved into a new place with a better layout, management, and opportunities to decorate it comfortably to suit her preferences.  Donella reported that she already has a journal, but hasn't been consistent with it, so she is more motivated today to make writing in it regularly a self-care activity.      Assessment and Plan: Counselor recommends that Tywanna remain in IOP treatment to better manage mental health symptoms, ensure stability and pursue completion of treatment plan goals. Counselor recommends adherence to crisis/safety plan, taking medications as prescribed, and following up with medical professionals if any issues arise.   Follow Up Instructions: Counselor will send Webex link for next session. Janaa was advised to call back or seek an in-person evaluation if the symptoms worsen or if the condition fails to improve as anticipated.   I provided 180 minutes of non-face-to-face time during this encounter.   Noralee Stain, LCSW, LCAS 08/02/21

## 2021-08-03 ENCOUNTER — Other Ambulatory Visit (HOSPITAL_COMMUNITY): Payer: Medicare Other | Admitting: Licensed Clinical Social Worker

## 2021-08-03 ENCOUNTER — Other Ambulatory Visit: Payer: Self-pay

## 2021-08-03 DIAGNOSIS — F3181 Bipolar II disorder: Secondary | ICD-10-CM

## 2021-08-03 DIAGNOSIS — F332 Major depressive disorder, recurrent severe without psychotic features: Secondary | ICD-10-CM | POA: Diagnosis not present

## 2021-08-03 NOTE — Progress Notes (Signed)
Virtual Visit via Telephone Note  I connected with Kimberly Harper on 08/03/21 at  9:00 AM EST by telephone and verified that I am speaking with the correct person using two identifiers.  Location: Patient: Home Provider: Office   I discussed the limitations, risks, security and privacy concerns of performing an evaluation and management service by telephone and the availability of in person appointments. I also discussed with the patient that there may be a patient responsible charge related to this service. The patient expressed understanding and agreed to proceed.    I discussed the assessment and treatment plan with the patient. The patient was provided an opportunity to ask questions and all were answered. The patient agreed with the plan and demonstrated an understanding of the instructions.   The patient was advised to call back or seek an in-person evaluation if the symptoms worsen or if the condition fails to improve as anticipated.  I provided 15 minutes of non-face-to-face time during this encounter.   Kimberly Rackanika N Sumaiyah Markert, NP    Psychiatric Initial Adult Assessment   Patient Identification: Kimberly Harper:  [id number] Date of Evaluation:  08/03/2021 Referral Source: self Chief Complaint:  Depression Visit Diagnosis:    ICD-10-CM   1. Severe episode of recurrent major depressive disorder, without psychotic features (HCC)  F33.2     2. Bipolar 2 disorder, major depressive episode (HCC)  F31.81       History of Present Illness:  Kimberly Harper is a 36 year old African-American female who presents due to worsening depression and anxiety.  Patient is very minimal throughout this assessment states she has been experiencing intense intrusive thoughts for self-harm.  She denied plan or intent.  States she was followed by the mood treatment center however due to financial constraints she has been able to follow-up for medication management.  States she was prescribed  Xanax for her anxiety and panic disorder.  She reports low motivation, symptoms of worry and worsening depression.  Does report financial concerns however did not elaborate on stressors.  Reports occasional alcohol use.  Denied illicit drug use or substance abuse history.   Reports she is currently followed by her primary care provider who has been prescribing her medications currently.  She is denying auditory or visual hallucinations.  Denied suicidal or homicidal ideations.  Patient to start intensive outpatient programming on 08/02/2021.  Per previous assessment note:  Kimberly Harper is a 36 year old female with a probable past psychiatric history significant for anxiety, depression, posttraumatic stress disorder and probable borderline personality disorder who [facility] as a direct evaluation.    Associated Signs/Symptoms: Depression Symptoms:  depressed mood, difficulty concentrating, anxiety, (Hypo) Manic Symptoms:  Distractibility, Anxiety Symptoms:  Excessive Worry, Psychotic Symptoms:  Hallucinations: None PTSD Symptoms: NA  Past Psychiatric History:   Previous Psychotropic Medications: No   Substance Abuse History in the last 12 months:  No.  Consequences of Substance Abuse: NA  Past Medical History:  Past Medical History:  Diagnosis Date   Alcoholic (HCC)    Anemia    [employer]reesia 01/17/2020   Anxiety    Asthma    Bipolar 1 disorder (HCC)    Blood transfusion without reported diagnosis    [employer] 01/17/2020   Chronic headaches    Depression    [employer]ia 01/17/2020   Major depressive disorder    Substance abuse (HCC)    [employer]rees[employer] 01/17/2020    Past Surgical History:  Procedure Laterality Date  CESAREAN SECTION N/A    [employer] 01/17/2020   COLONOSCOPY     earlier in year 2022   DILATION AND CURETTAGE OF UTERUS     HAND SURGERY Right    WISDOM TOOTH EXTRACTION      Family Psychiatric History:   Family History:   Family History  Problem Relation Age of Onset   Hypertension Mother    Diabetes Mother    Stroke Mother    Depression Mother    Depression Sister    Irritable bowel syndrome Sister    Asthma Brother    Allergic rhinitis Brother    Migraines Brother    Depression Brother    Stroke Maternal Grandmother    Diabetes Maternal Grandmother    Alcohol abuse Maternal Grandfather    Stroke Paternal Grandmother    Breast cancer Paternal Grandmother    Alcohol abuse Paternal Grandfather    Lupus Half-Sister    Angioedema Neg Hx    Eczema Neg Hx    Immunodeficiency Neg Hx    Atopy Neg Hx     Social History:   Social History   Socioeconomic History   Marital status: Single    Spouse name: Not on file   Number of children: 1   Years of education: Not on file   Highest education level: Not on file  Occupational History   Occupation: Academic librarian  Tobacco Use   Smoking status: Former    Types: Cigarettes    Quit date: 09/07/2006    Years since quitting: 14.9   Smokeless tobacco: Never  Vaping Use   Vaping Use: Never used  Substance and Sexual Activity   Alcohol use: Yes    Comment: now soically   Drug use: Not Currently    Types: Marijuana   Sexual activity: Yes    Birth control/protection: None  Other Topics Concern   Not on file  Social History Narrative   Right handed   Social Determinants of Health   Financial Resource Strain: Not on file  Food Insecurity: Not on file  Transportation Needs: Not on file  Physical Activity: Not on file  Stress: Not on file  Social Connections: Not on file    Additional Social History:   Allergies:   Allergies  Allergen Reactions   Multiple Vitamins-Iron Swelling    States that vitamins and iron have caused swelling of tongue and gums    Metabolic Disorder Labs: Lab Results  Component Value Date   HGBA1C 5.3 01/19/2020   MPG 105 12/07/2019   No results found for: PROLACTIN Lab Results  Component Value Date    CHOL 189 01/19/2020   TRIG 109 01/19/2020   HDL 47 01/19/2020   CHOLHDL 4.0 01/19/2020   VLDL 9 12/07/2019   LDLCALC 122 (H) 01/19/2020   LDLCALC 141 (H) 12/07/2019   Lab Results  Component Value Date   TSH 1.16 04/12/2021    Therapeutic Level Labs: No results found for: LITHIUM No results found for: CBMZ No results found for: VALPROATE  Current Medications: Current Outpatient Medications  Medication Sig Dispense Refill   ALPRAZolam (XANAX) 1 MG tablet Take 1 mg by mouth 2 (two) times daily as needed.     azelastine (ASTELIN) 0.1 % nasal spray Place 1-2 sprays into both nostrils 2 (two) times daily as needed (nasal drainage). Use in each nostril as directed 30 mL 5   famotidine (PEPCID) 20 MG tablet TAKE 1 TABLET(20 MG) BY MOUTH TWICE DAILY 180 tablet 0  fluticasone (FLONASE) 50 MCG/ACT nasal spray Place 1 spray into both nostrils 2 (two) times daily as needed (nasal congestion). 16 g 5   fluticasone (FLOVENT HFA) 110 MCG/ACT inhaler Inhale 2 puffs into the lungs in the morning and at bedtime. with spacer and rinse mouth afterwards. 1 each 3   levocetirizine (XYZAL) 5 MG tablet Take 1 tablet (5 mg total) by mouth every evening. 30 tablet 3   linaclotide (LINZESS) 145 MCG CAPS capsule Take 1 capsule (145 mcg total) by mouth daily before breakfast. If stools become loose, reduce 1/2 tablet daily); MUST CALL TO SCHEDULE APPT IN 1 MONTH 30 capsule 0   omeprazole (PRILOSEC) 40 MG capsule Take 1 capsule twice a day for 12 weeks then take 1 capsule daily 60 capsule 6   ondansetron (ZOFRAN) 4 MG tablet Take 1 tablet (4 mg total) by mouth every 8 (eight) hours as needed for nausea or vomiting. 50 tablet 0   pramipexole (MIRAPEX) 0.25 MG tablet Take by mouth.     propranolol (INDERAL) 20 MG tablet TAKE 1 TABLET(20 MG) BY MOUTH DAILY 90 tablet 0   pseudoephedrine (SUDAFED) 30 MG tablet Take 1 tablet (30 mg total) by mouth every 8 (eight) hours as needed for congestion. 30 tablet 0   sucralfate  (CARAFATE) 1 GM/10ML suspension Take 10 mLs (1 g total) by mouth 4 (four) times daily -  with meals and at bedtime. 420 mL 0   traZODone (DESYREL) 100 MG tablet TAKE 1 TABLET(100 MG) BY MOUTH AT BEDTIME AS NEEDED FOR SLEEP 90 tablet 0   No current facility-administered medications for this visit.    Musculoskeletal: Strength & Muscle Tone: within normal limits Gait & Station: normal Patient leans: N/A  Psychiatric Specialty Exam: Review of Systems  There were no vitals taken for this visit.There is no height or weight on file to calculate BMI.  General Appearance: Casual  Eye Contact:  Good  Speech:  Clear and Coherent  Volume:  Normal  Mood:  Anxious and Depressed  Affect:  Congruent  Thought Process:  Coherent  Orientation:  Full (Time, Place, and Person)  Thought Content:  Logical  Suicidal Thoughts:  No  Homicidal Thoughts:  No  Memory:  Immediate;   Good Recent;   Good  Judgement:  Good  Insight:  Good  Psychomotor Activity:  Normal  Concentration:  Concentration: Good  Recall:  Good  Fund of Knowledge:Good  Language: Good  Akathisia:  No  Handed:  Right  AIMS (if indicated):  done  Assets:  Communication Skills Resilience Social Support  ADL's:  Intact  Cognition: WNL  Sleep:  Fair   Screenings: AIMS    Flowsheet Row [facility]Dis[facility]  AIMS Total Score 0      AUDIT    Flowsheet Row Admission (Discharged) [facility]  Alcohol Use Disorder Identification Test Final Score (AUDIT) 1      GAD-7    Flowsheet Row [facility] Visit [facility]2020 in [facility] Office Visit from 01/19/2020 [facility]  Total GAD-7 Score 16 10      PHQ2-9    Flowsheet Row Counselor [facility] from [facility]12/2021 in [facility] Office Visit from [facility]/20[facility]2021 in [facility] Visit from [facility]/30/2020 in [facility] Visit from 03/19/2020 in [facility] Care at [facility] Total Score 4 1 0 6 0  PHQ-9 Total Score 20 8 -- 22 --      Flowsheet Row Counselor from 08/01/2021 in [facility] ED from 06/21/2021 in [facility] at [facility]  ED from 06/10/2021 in [facility]   C-SSRS RISK CATEGORY Error: Question 6 not populated No Risk No Risk       Assessment and Plan:  Patient to start intensive outpatient programming Denied that she is currently prescribed any psychotropic medication, patient is seeking to be restarted on Xanax only -Case management to follow-up with additional outpatient resources -Patient to consider follow-up at [facility] urgent care  Treatment plan was reviewed and agreed upon by NP T. Melvyn Neth and patient Kimberly Harper need for group services   Kimberly Rack, NP 1/11/202310:18 AM

## 2021-08-03 NOTE — Progress Notes (Signed)
Virtual Visit via Video Note   I connected with Clovis Fredrickson on 08/03/21 at  9:00 AM EDT by a video enabled telemedicine application and verified that I am speaking with the correct person using two identifiers.   At orientation to the IOP program, Case Manager discussed the limitations of evaluation and management by telemedicine and the availability of in person appointments. The patient expressed understanding and agreed to proceed with virtual visits throughout the duration of the program.   Location:  Patient: Patient Home Provider: [facility] Office   History of Present Illness: MDD    Observations/Objective: Check In: Case Manager checked in with all participants to review discharge dates, insurance authorizations, work-related documents and needs from the treatment team regarding medications. Amori stated needs and engaged in discussion.    Initial Therapeutic Activity: Counselor facilitated a check-in with Airi to assess for safety, sobriety and medication compliance.  Counselor also inquired about Lekeya's current emotional ratings, as well as any significant changes in thoughts, feelings or behavior since previous check in.  Alvah presented for session on time and was alert, oriented x5, with no evidence or self-report of active SI/HI or A/V H.  Timmie reported compliance with medication and denied use of alcohol or illicit substances.  Maurita reported scores of 0/10 for depression, 5/10 for anxiety, and 2/10 for anger/irritability.  Michaella denied any recent panic attacks.  Rhema reported that a recent success was going out on a date last night.  Mckensie reported that a recent struggle was having an outburst this morning due to her son taking too much time getting ready for school.  Keliyah reported that her goal is to take a nap for self-care.        Second Therapeutic Activity: Counselor introduced Peggye Fothergill, [employer] Pharmacist, to provide psychoeducation on topic  of medication compliance with members today.  Michelle Nasuti provided psychoeducation on classes of medications such as antidepressants, antipsychotics, what symptoms they are intended to treat, and any side effects one might encounter while on a particular prescription.  Time was allowed for clients to ask any questions they might have of [facility] regarding this specialty.  Intervention was effective, as evidenced by Shanaia participating in discussion with speaker on the subject, and inquiring about whether a medication she used in the past was ineffective due to not being on it long enough.  Aeralyn reported that she was prescribed hydroxyzine, but didn't notice tangible benefits and abruptly stopped taking it as a result.  Eureka was receptive to feedback from pharmacist on the science behind how this medication works, and alternatives that could prove more effective in addressing current needs.   Third Therapeutic Activity: Counselor provided demonstration of relaxation technique known as mindful breathing to help members increase sense of calm, resiliency, and control.  Counselor guided members through process of getting comfortable, achieving a relaxed breathing rhythm, and focusing on this for several minutes, allowing troubling thoughts and feelings to come and go without rumination.  Counselor processed effectiveness of activity afterward in discussion with members, including how this impacted their mental state, whether it was difficult to stay focused, and if they plan to include it in self-care routine to improve day-to-day coping.  Intervention was effective, as evidenced by Khamya participating in exercise and reporting that she believes it could be helpful for self-care if she wasn't distracted by an upset stomach, stating Its been bothering me all day and made it hard to focus.    Assessment and Plan: Counselor  recommends that Haydyn remain in IOP treatment to better manage mental health  symptoms, ensure stability and pursue completion of treatment plan goals. Counselor recommends adherence to crisis/safety plan, taking medications as prescribed, and following up with medical professionals if any issues arise.   Follow Up Instructions: Counselor will send Webex link for next session. Courtlyn was advised to call back or seek an in-person evaluation if the symptoms worsen or if the condition fails to improve as anticipated.   I provided 180 minutes of non-face-to-face time during this encounter.   Noralee Stain, [place], LCAS 08/03/21

## 2021-08-04 ENCOUNTER — Other Ambulatory Visit (HOSPITAL_COMMUNITY): Payer: Medicare Other | Admitting: Licensed Clinical Social Worker

## 2021-08-04 ENCOUNTER — Telehealth: Payer: Medicare Other | Admitting: Registered Nurse

## 2021-08-04 DIAGNOSIS — F332 Major depressive disorder, recurrent severe without psychotic features: Secondary | ICD-10-CM

## 2021-08-04 NOTE — Progress Notes (Signed)
Virtual Visit via Video Note   I connected with Kimberly Harper on 08/04/21 at  9:00 AM EDT by a video enabled telemedicine application and verified that I am speaking with the correct person using two identifiers.   At orientation to the IOP program, Case Manager discussed the limitations of evaluation and management by telemedicine and the availability of in person appointments. The patient expressed understanding and agreed to proceed with virtual visits throughout the duration of the program.   Location:  Patient: Patient Home Provider: [facility] [place] [facility]   History of Present Illness: MDD    Observations/Objective: Check In: Case Manager checked in with all participants to review discharge dates, insurance authorizations, work-related documents and needs from the treatment team regarding medications. Kimberly Harper stated needs and engaged in discussion.    Initial Therapeutic Activity: Counselor facilitated a check-in with Kimberly Harper to assess for safety, sobriety and medication compliance.  Counselor also inquired about Kimberly Harper's current emotional ratings, as well as any significant changes in thoughts, feelings or behavior since previous check in.  Kimberly Harper presented for session on time and was alert, oriented x5, with no evidence or self-report of active SI/HI or A/V H.  Kimberly Harper reported compliance with medication and denied use of alcohol or illicit substances.  Kimberly Harper reported scores of 3/10 for depression, 9/10 for anxiety, and 0/10 for anger/irritability.  Kimberly Harper denied any recent outbursts or panic attacks.  Kimberly Harper reported that a recent success was taking off work to rest and cook a nice meal.  Kimberly Harper reported that a recent struggle has been continuing to deal with stomach pain which has been going on for the past year without any relief, although she reported she will continue to follow up with specialists to seek answers.  Kimberly Harper reported that her goal today is to attend a virtual  doctor's appointment.          Second Therapeutic Activity: Counselor introduced topic of building social support network today.  Counselor explained how this can be defined as having a having a group of healthy people in one's life you can talk to, spend time with, and get help from to improve both mental and physical health.  Counselor noted that some barriers can make it difficult to connect with other people, including the presence of anxiety or depression, or moving to an unfamiliar area.  Group members were asked to assess the current state of their support network, and identify ways that this could be improved.  Tips were given on how to address previously noted barriers, such as strengthening social skills, using relaxation techniques to reduce anxiety, scheduling social time each week, and/or exploring social events nearby which could increase chances of meeting new supports.  Members were also encouraged to consider getting closer to people they already know through suggestions such as outreaching someone by text, email or phone call if they haven't spoken in awhile, doing something nice for a friend/family member unexpectedly, and/or inviting someone over for a game/movie/dinner night.  Intervention was effective, as evidenced by Kimberly Harper participating in discussion on subject and reporting that some of her barriers to socialization include not staying in touch with people she meets, or experiencing negative thoughts which affect her self-esteem and make her believe people wouldn't want to spend time with her.  Kimberly Harper stated Its really hard for me to cultivate good relationships honestly.  Kimberly Harper reported that she will make an effort to find social events where she can comfortably connect with new people that have common  interests, such as small business owners classes, or volunteer with organizations like [employer] that support causes like mental health awareness, since this is important to her.  She  reported that she would also work to improve her nonverbal communication style, noting that she will often look like she is scowling and doesn't want to be bothered.    Assessment and Plan: Counselor recommends that Kimberly Harper remain in IOP treatment to better manage mental health symptoms, ensure stability and pursue completion of treatment plan goals. Counselor recommends adherence to crisis/safety plan, taking medications as prescribed, and following up with medical professionals if any issues arise.   Follow Up Instructions: Counselor will send Webex link for next session. Kimberly Harper was advised to call back or seek an in-person evaluation if the symptoms worsen or if the condition fails to improve as anticipated.   I provided 180 minutes of non-face-to-face time during this encounter.   Shade Flood, , LCAS 08/04/21

## 2021-08-05 ENCOUNTER — Encounter: Payer: Self-pay | Admitting: Registered Nurse

## 2021-08-05 ENCOUNTER — Telehealth (HOSPITAL_COMMUNITY): Payer: Self-pay | Admitting: Psychiatry

## 2021-08-05 ENCOUNTER — Other Ambulatory Visit: Payer: Self-pay

## 2021-08-05 ENCOUNTER — Telehealth (INDEPENDENT_AMBULATORY_CARE_PROVIDER_SITE_OTHER): Payer: Medicare Other | Admitting: Registered Nurse

## 2021-08-05 ENCOUNTER — Other Ambulatory Visit (HOSPITAL_COMMUNITY): Payer: Medicare Other | Admitting: Psychiatry

## 2021-08-05 VITALS — BP 100/60 | Ht 65.0 in | Wt 130.0 lb

## 2021-08-05 DIAGNOSIS — F332 Major depressive disorder, recurrent severe without psychotic features: Secondary | ICD-10-CM | POA: Diagnosis not present

## 2021-08-05 DIAGNOSIS — F411 Generalized anxiety disorder: Secondary | ICD-10-CM

## 2021-08-05 MED ORDER — VORTIOXETINE HBR 20 MG PO TABS: ORAL_TABLET | ORAL | 0 refills | Status: DC

## 2021-08-05 NOTE — Telephone Encounter (Signed)
D:  Group leader requested that I reach out to pt d/t self-injurious behaviors yesterday.  A:  Placed call, but she didn't answer.  Left vm for pt to call the case manager back.  Will inform Hillery Jacks, NP. Also, will attempt to call pt again.

## 2021-08-05 NOTE — Progress Notes (Signed)
Telemedicine Encounter- SOAP NOTE Established Patient  This video encounter was conducted with the patient's (or proxy's) verbal consent via audio telecommunications: yes/no: Yes Patient was instructed to have this encounter in a suitably private space; and to only have persons present to whom they give permission to participate. In addition, patient identity was confirmed by use of name plus two identifiers (DOB and address).  I discussed the limitations, risks, security and privacy concerns of performing an evaluation and management service by telephone and the availability of in person appointments. I also discussed with the patient that there may be a patient responsible charge related to this service. The patient expressed understanding and agreed to proceed.  I spent a total of 16 minutes talking with the patient or their proxy.  Patient at home Provider in office  Participants: Jari Sportsman, NP and [name]  Chief Complaint  Patient presents with   Abdominal Pain    Past few days, feeling nauseas and having trouble eating. Pain located in the "middle of stomach."    Subjective   Kimberly Harper is a 36 y.o. established patient. Video visit today for abdominal pain  HPI Ongoing for a few days - had taken linzess and dulcolax with improvement Notes ongoing nausea and weakness.  Nausea has been persistent, daily.   Appetite has been poor   Using zofran without effect.   Last time she ate was a few hours ago. Had mashed potatoes, string beans, and chicken.   Notes increased belching - feels like she "wants to throw up". Has thrown up despite taking zofran in the past.   Suggested to be anxiety by Dr. Orvan Falconer   Has been on alprazolam with some effect In the past buspar, hydroxyzine, sertraline, fluoxetine, celexa, venlafaxine  and propranolol with limited effect.    Patient Active Problem List   Diagnosis Date Noted   Other allergic rhinitis  06/22/2021   Asthma 06/22/2021   Heartburn 06/22/2021   Panic disorder 04/12/2020   GAD (generalized anxiety disorder) 03/18/2020   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   Borderline personality disorder (HCC) 02/06/2020   Bipolar 2 disorder, major depressive episode (HCC) 12/06/2019    Past Medical History:  Diagnosis Date   Alcoholic (HCC)    Anemia    [facility] 01/17/2020   Anxiety    Asthma    Bipolar 1 disorder (HCC)    Blood transfusion without reported diagnosis    [facility] 01/17/2020   Chronic headaches    Depression    [facility] 01/17/2020   Major depressive disorder    Substance abuse (HCC)    [facility] 01/17/2020    Current Outpatient Medications  Medication Sig Dispense Refill   ALPRAZolam (XANAX) 1 MG tablet Take 1 mg by mouth 2 (two) times daily as needed.     famotidine (PEPCID) 20 MG tablet TAKE 1 TABLET(20 MG) BY MOUTH TWICE DAILY 180 tablet 0   fluticasone (FLONASE) 50 MCG/ACT nasal spray Place 1 spray into both nostrils 2 (two) times daily as needed (nasal congestion). 16 g 5   omeprazole (PRILOSEC) 40 MG capsule Take 1 capsule twice a day for 12 weeks then take 1 capsule daily 60 capsule 6   ondansetron (ZOFRAN) 4 MG tablet Take 1 tablet (4 mg total) by mouth every 8 (eight) hours as needed for nausea or vomiting. 50 tablet 0   propranolol (INDERAL) 20 MG tablet TAKE 1 TABLET(20 MG) BY MOUTH DAILY 90 tablet 0   vortioxetine HBr (TRINTELLIX) 20  MG TABS tablet Take 0.5 tablets (10 mg total) by mouth daily for 14 days, THEN 1 tablet (20 mg total) daily. 83 tablet 0   linaclotide (LINZESS) 145 MCG CAPS capsule Take 1 capsule (145 mcg total) by mouth daily before breakfast. If stools become loose, reduce 1/2 tablet daily); MUST CALL TO SCHEDULE APPT IN 1 MONTH (Patient not taking: Reported on 08/05/2021) 30 capsule 0   pramipexole (MIRAPEX) 0.25 MG tablet Take by mouth. (Patient not taking: Reported on 08/05/2021)     sucralfate (CARAFATE) 1 GM/10ML suspension Take 10 mLs  (1 g total) by mouth 4 (four) times daily -  with meals and at bedtime. (Patient not taking: Reported on 08/05/2021) 420 mL 0   No current facility-administered medications for this visit.    Allergies  Allergen Reactions   Multiple Vitamins-Iron Swelling    States that vitamins and iron have caused swelling of tongue and gums    Social History   Socioeconomic History   Marital status: Single    Spouse name: Not on file   Number of children: 1   Years of education: Not on file   Highest education level: Not on file  Occupational History   Occupation: Academic librarianpackage handler  Tobacco Use   Smoking status: Former    Types: Cigarettes    Quit date: 09/07/2006    Years since quitting: 14.9   Smokeless tobacco: Never  Vaping Use   Vaping Use: Never used  Substance and Sexual Activity   Alcohol use: Yes    Comment: now soically   Drug use: Not Currently    Types: Marijuana   Sexual activity: Yes    Birth control/protection: None  Other Topics Concern   Not on file  Social History Narrative   Right handed   Social Determinants of Health   Financial Resource Strain: Not on file  Food Insecurity: Not on file  Transportation Needs: Not on file  Physical Activity: Not on file  Stress: Not on file  Social Connections: Not on file  Intimate Partner Violence: Not on file    Review of Systems  Constitutional: Negative.   HENT: Negative.    Eyes: Negative.   Respiratory: Negative.    Cardiovascular: Negative.   Gastrointestinal:  Positive for abdominal pain, constipation, nausea and vomiting.  Genitourinary: Negative.   Musculoskeletal: Negative.   Skin: Negative.   Neurological: Negative.   Endo/Heme/Allergies: Negative.   Psychiatric/Behavioral:  Positive for depression. The patient is nervous/anxious.   All other systems reviewed and are negative.  Objective   Vitals as reported by the patient: Today's Vitals   08/05/21 1051  BP: 100/60  Weight: 130 lb (59 kg)   Height: 5\' 5"  (1.651 m)  PainSc: 4     Kimberly Harper was seen today for abdominal pain.  Diagnoses and all orders for this visit:  GAD (generalized anxiety disorder) -     Ambulatory referral to Psychiatry -     vortioxetine HBr (TRINTELLIX) 20 MG TABS tablet; Take 0.5 tablets (10 mg total) by mouth daily for 14 days, THEN 1 tablet (20 mg total) daily.    PLAN Start vortioxetine 10mg  po qd. Then increase to 20mg  po qd after 2 weeks if tolerating well. Refer to psychiatry Patient encouraged to call clinic with any questions, comments, or concerns.  I discussed the assessment and treatment plan with the patient. The patient was provided an opportunity to ask questions and all were answered. The patient agreed with the plan and demonstrated  an understanding of the instructions.   The patient was advised to call back or seek an in-person evaluation if the symptoms worsen or if the condition fails to improve as anticipated.  I provided 16 minutes of face-to-face time during this encounter.  Janeece Agee, NP

## 2021-08-05 NOTE — Telephone Encounter (Signed)
D:  Placed another call to check on pt, but there was no answer.  A:  Left vm for her to go to [facility] or [facility] or call [phone] if crisis arise.  Informed Ricky Ala, NP.

## 2021-08-05 NOTE — Progress Notes (Signed)
Virtual Visit via Video Note   I connected with Kimberly Harper on 08/05/21 at  9:00 AM EDT by a video enabled telemedicine application and verified that I am speaking with the correct person using two identifiers.   At orientation to the IOP program, Case Manager discussed the limitations of evaluation and management by telemedicine and the availability of in person appointments. The patient expressed understanding and agreed to proceed with virtual visits throughout the duration of the program.   Location:  Patient: [facility] Home Provider: [facility]   History of Present Illness: MDD    Observations/Objective: Check In: Case Manager checked in with all participants to review discharge dates, insurance authorizations, work-related documents and needs from the treatment team regarding medications. Kimberly Harper stated needs and engaged in discussion.    Initial Therapeutic Activity: Counselor facilitated a check-in with Kimberly Harper to assess for safety, sobriety and medication compliance.  Counselor also inquired about Kimberly Harper's current emotional ratings, as well as any significant changes in thoughts, feelings or behavior since previous check in.  Kimberly Harper presented for session on time and was alert, oriented x5, with no evidence or self-report of active SI/HI or A/V H.  Kimberly Harper reported compliance with medication and denied use of alcohol or illicit substances.  Kimberly Harper reported scores of 5/10 for depression, 8/10 for anxiety, and 9/10 for anger/irritability.  Kimberly Harper denied any recent panic attacks.  Kimberly Harper reported that a recent struggle was experiencing a 'breakdown' yesterday, which led to crying spells, an outburst, and self-harm involving cutting her arm.  Kimberly Harper reported that she did not injure herself enough to necessitate going to be checked out by a doctor, and has treated the wound.  Kimberly Harper denied any thoughts, intent or plan to self-harm today, and was agreeable to implementation of  safety plan to keep herself safe should urge to harm self return.  Counselor discussed thought stopping techniques with Kimberly Harper that could be used as part of safety plan, and she agreed to visit the hospital for safety should these prove ineffective in a crisis.      Second Therapeutic Activity: Counselor introduced topic of assertive communication today.  Counselor shared various handouts with members virtually in group to read along with on the subject.  These handouts defined assertive communication as a communication style in which a person stands up for their own needs and wants, while also taking into consideration the needs and wants of others, without behaving in a passive or aggressive way.  Traits of assertive communicators were highlighted such as using appropriate speaking volume, maintaining eye contact, using confident language, and avoiding interruption.  Members were also provided with tips on how to improve communication, including respecting oneself, expressing thoughts and feelings calmly, and saying No when necessary.  Members were given a variety of scenarios where they could practice using these tips to respond in an assertive manner.  Intervention was effective, as evidenced by Kimberly Harper participating in discussion on subject and reporting that she flucatuates between passive and aggressive communication often, as she struggles to express her wants and needs to people, and lacks confidence, but when she can no longer compartmentalize her distress, she can become easily frustrated, loud, is unwilling to compromise, critical, and disrespectful.  Kimberly Harper reported that she is motivated to work on Sales promotion account executive style, but was unable to practice roleplay activities today, as she had to attend a doctor's appointment and left group at 11:30am.    Third Therapeutic Activity:  Psycho-educational portion of group was provided  by Virgina Evener, director of community education with  [employer].  Kimberly Harper provided information on history of her local agency, mission statement, and the variety of unique services offered which group members might find beneficial to engage in, including both virtual and in-person support groups, as well as peer support program for mentoring.  Kimberly Harper offered time to answer member's questions regarding services and encouraged them to consider utilizing these services to assist in working towards their individual wellness goals.  Intervention effectiveness could not be measured due to Kimberly Harper leaving early to attend doctor's appointment.    Assessment and Plan: Counselor recommends that Kimberly Harper remain in IOP treatment to better manage mental health symptoms, ensure stability and pursue completion of treatment plan goals. Counselor recommends adherence to crisis/safety plan, taking medications as prescribed, and following up with medical professionals if any issues arise.   Follow Up Instructions: Counselor will send Webex link for next session. Kimberly Harper was advised to call back or seek an in-person evaluation if the symptoms worsen or if the condition fails to improve as anticipated.   I provided 150 minutes of non-face-to-face time during this encounter.   Noralee Stain, [place], LCAS 08/05/21

## 2021-08-08 ENCOUNTER — Telehealth (HOSPITAL_COMMUNITY): Payer: Self-pay | Admitting: Psychiatry

## 2021-08-08 ENCOUNTER — Ambulatory Visit (HOSPITAL_COMMUNITY): Payer: Self-pay | Admitting: Psychiatry

## 2021-08-08 NOTE — Telephone Encounter (Signed)
D:  Pt sent an email stating that she wouldn't be attending virtual MH-IOP today d/t feeling sleepy.  "I took Trazodone to help me sleep lastnight and I woke up feeling dizzy and my head feeling heavy.  I'm also very sleepy and I don't want to fall asleep during class." A:  Informed treatment team and Ricky Ala, NP.

## 2021-08-09 ENCOUNTER — Other Ambulatory Visit: Payer: Self-pay

## 2021-08-09 ENCOUNTER — Other Ambulatory Visit (HOSPITAL_COMMUNITY): Payer: Medicare Other | Admitting: Licensed Clinical Social Worker

## 2021-08-09 DIAGNOSIS — F332 Major depressive disorder, recurrent severe without psychotic features: Secondary | ICD-10-CM

## 2021-08-09 NOTE — Progress Notes (Signed)
Virtual Visit via Video Note   I connected with Ernest Pine on 08/09/21 at  9:00 AM EDT by a video enabled telemedicine application and verified that I am speaking with the correct person using two identifiers.   At orientation to the IOP program, Case Manager discussed the limitations of evaluation and management by telemedicine and the availability of in person appointments. The patient expressed understanding and agreed to proceed with virtual visits throughout the duration of the program.   Location:  Patient: Patient Home Provider: [facility]   History of Present Illness: MDD    Observations/Objective: Check In: Case Manager checked in with all participants to review discharge dates, insurance authorizations, work-related documents and needs from the treatment team regarding medications. Taliana stated needs and engaged in discussion.    Initial Therapeutic Activity: Counselor facilitated a check-in with Margi to assess for safety, sobriety and medication compliance.  Counselor also inquired about Dionna's current emotional ratings, as well as any significant changes in thoughts, feelings or behavior since previous check in.  Pallavi presented for session on time and was alert, oriented x5, with no evidence or self-report of active SI/HI or A/V H.  Najmo reported compliance with medication and denied use of alcohol or illicit substances.  Efrat reported scores of 4/10 for depression, 7/10 for anxiety, and 2/10 for anger/irritability.  Latrica denied any recent outbursts or panic attacks.  Shakthi reported that a recent success was completing a class she discovered online for administering 'emotional CPR' to people in need.  Shinika reported that a recent struggle was taking Trazodone the night before yesterdays group, stating I felt horrible physically because my head hurt and I missed group because of that.  Natascha reported that her goal today is to complete her taxes  this afternoon.       Second Therapeutic Activity: Counselor introduced [employer], [place] Chaplain to provide psychoeducation on topic of Grief and Loss with members today.  Estill Bamberg began discussion by checking in with the group about their baseline mood today, general thoughts on what grief means to them and how it has affected them personally in the past.  Estill Bamberg provided information on how the process of grief/loss can differ depending upon one's unique culture, and categories of loss one could experience (i.e. loss of a person, animal, relationship, job, identity, etc).  Estill Bamberg encouraged members to be mindful of how pervasive loss can be, and how to recognize signs which could indicate that this is having an impact on one's overall mental health and wellbeing.  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  She stated Its kind of like a culture shock once its over. Like what do I do with myself now?  She reported that its helpful to include things that bring her joy in her routine to cope with this sense of loss, such as making beaded jewelry.    Third Therapeutic Activity: Counselor introduced topic of self-care today.  Counselor explained how this can be defined as the things one does to maintain good health and improve well-being.  Counselor provided members with a self-care assessment form to complete.  This handout featured various sub-categories of self-care, including physical, psychological/emotional, social, spiritual, and  professional.  Members were asked to rank their engagement in the activities listed for each dimension on a scale of 1-3, with 1 indicating 'Poor', 2 indicating 'Ok', and 3  indicating 'Well'.  Counselor invited members to share results of their assessment, and inquired about which areas of self-care they are doing well in, as well as areas that require attention, and how they plan to begin addressing this during treatment. Intervention was effective, as evidenced by Dennis successfully completing initial 2 sections of assessment and actively engaging in discussion on subject, reporting that she is excelling in areas such as attending medical appointments, taking time off work, doing comforting activities, and finding reasons to laugh, but would benefit from focusing more on areas such as exercising regularly, eating regular daily meals, participating in fun activities, getting enough sleep, participating in hobbies, expressing feelings in healthy ways, talking about problems, taking day trips, and recognizing her strengths.  Sheyli reported that she would work to improve self-care deficits by establishing an exercise routine with goal of working out at the gym x1 per week for 1 hour, using sleep hygiene techniques to establish a balanced sleep schedule, exploring active hobbies such as tennis, baseball, or swimming, and finding outlets to express feelings outside of group, such as journaling or taking a course to explore artistic skills.     Assessment and Plan: Counselor recommends that Yasamin remain in IOP treatment to better manage mental health symptoms, ensure stability and pursue completion of treatment plan goals. Counselor recommends adherence to crisis/safety plan, taking medications as prescribed, and following up with medical professionals if any issues arise.   Follow Up Instructions: Counselor will send Webex link for next session. Yoseline was advised to call back or seek an in-person evaluation if the symptoms worsen or if the condition fails to improve as anticipated.   I provided 180 minutes of non-face-to-face time during this encounter.   Shade Flood,  [address], LCAS 08/09/21

## 2021-08-10 ENCOUNTER — Other Ambulatory Visit: Payer: Self-pay

## 2021-08-10 ENCOUNTER — Other Ambulatory Visit (HOSPITAL_COMMUNITY): Payer: Medicare Other | Admitting: Licensed Clinical Social Worker

## 2021-08-10 DIAGNOSIS — F332 Major depressive disorder, recurrent severe without psychotic features: Secondary | ICD-10-CM

## 2021-08-10 NOTE — Progress Notes (Signed)
Virtual Visit via Video Note   I connected with Clovis Fredrickson on 08/10/21 at  9:00 AM EDT by a video enabled telemedicine application and verified that I am speaking with the correct person using two identifiers.   At orientation to the IOP program, Case Manager discussed the limitations of evaluation and management by telemedicine and the availability of in person appointments. The patient expressed understanding and agreed to proceed with virtual visits throughout the duration of the program.   Location:  Patient: [facility] Home Provider: [facility]   History of Present Illness: MDD    Observations/Objective: Check In: Case Manager checked in with all participants to review discharge dates, insurance authorizations, work-related documents and needs from the treatment team regarding medications. Kimberly Harper stated needs and engaged in discussion.    Initial Therapeutic Activity: Counselor facilitated a check-in with Kimberly Harper to assess for safety, sobriety and medication compliance.  Counselor also inquired about Kimberly Harper's current emotional ratings, as well as any significant changes in thoughts, feelings or behavior since previous check in.  Kimberly Harper presented for session on time and was alert, oriented x5, with no evidence or self-report of active SI/HI or A/V H.  Kimberly Harper reported compliance with medication and denied use of alcohol or illicit substances.  Kimberly Harper reported scores of 7/10 for depression, 9/10 for anxiety, and 9/10 for anger/irritability.  Kimberly Harper denied any recent outbursts or panic attacks.  Kimberly Harper reported that a recent struggle was losing her job yesterday, stating They're letting people go and its really short notice.  Kimberly Harper reported that she is trying to think positively about the situation, noting that there are very toxic staff members and her complaints have continued to fall on deaf ears, stating I know I went above and beyond while I was there.  Kimberly Harper  reported that a goal will be to spend some time this week looking for a new job that has more positive qualities and consider connecting with vocational rehab.       Second Therapeutic Activity: Counselor offered to teach group members an ACT relaxation technique today to aid in managing difficult thoughts, feelings, urges, and sensations.  Counselor guided members through process of getting comfortable, achieving relaxing breathing rhythm, and then maintaining this throughout activity.  Counselor invited members to imagine a gently flowing stream in their mind with leaves floating upon it, and when any thoughts, feelings, urges, or sensations arose, good or bad, they were instructed to visualize placing them on these passing leaves over course of 10 minutes practice.  Intervention effectiveness could not be measured, as Kimberly Harper reported that she did not participate.  She reported that she received news during break that she would not receive a refund from a moving company she was having trouble with, and could not focus on activity as a result.    Third Therapeutic Activity: Psycho-educational portion of group was co-facilitated by wellness director (David Stall, MS, MPH, [facility]) focused on self-care in daily life. Facilitator and group members discussed presented materials regarding importance of sleep, diet, and exercise. Group members discussed any changes they are willing to make to improve an area of self-care in their lives (physical, psychological, emotional, spiritual, relationship, professional) to improve overall mental health as they continue with treatment.  Intervention was effective, as evidenced by Kimberly Harper participating in discussion with speaker on the subject, reporting that an aspect of wellness she would benefit from focusing more on would be including exercise in weekly routine. Kimberly Harper reported that she used to run  on the treadmill a lot, noting that the endorphin rush was good for her  mood, gave her more confidence in herself and her abilities.  Kimberly Harper also reported interest in starting a daily stretch routine again to address muscle tension, and start dancing again since this could improve cardio and mood.     Assessment and Plan: Counselor recommends that Kimberly Harper remain in IOP treatment to better manage mental health symptoms, ensure stability and pursue completion of treatment plan goals. Counselor recommends adherence to crisis/safety plan, taking medications as prescribed, and following up with medical professionals if any issues arise.   Follow Up Instructions: Counselor will send Webex link for next session. [name] was advised to call back or seek an in-person evaluation if the symptoms worsen or if the condition fails to improve as anticipated.   I provided 180 minutes of non-face-to-face time during this encounter.   Noralee Stain, [place], LCAS 08/10/21

## 2021-08-11 ENCOUNTER — Other Ambulatory Visit: Payer: Self-pay

## 2021-08-11 ENCOUNTER — Other Ambulatory Visit (HOSPITAL_COMMUNITY): Payer: Medicare Other | Admitting: Licensed Clinical Social Worker

## 2021-08-11 DIAGNOSIS — F332 Major depressive disorder, recurrent severe without psychotic features: Secondary | ICD-10-CM

## 2021-08-11 NOTE — Progress Notes (Signed)
Virtual Visit via Video Note   I connected with Kimberly Harper on 08/11/21 at  9:00 AM EDT by a video enabled telemedicine application and verified that I am speaking with the correct person using two identifiers.   At orientation to the IOP program, Case Manager discussed the limitations of evaluation and management by telemedicine and the availability of in person appointments. The patient expressed understanding and agreed to proceed with virtual visits throughout the duration of the program.   Location:  Patient: Patient Home Provider: [facility]   History of Present Illness: MDD    Observations/Objective: Check In: Case Manager checked in with all participants to review discharge dates, insurance authorizations, work-related documents and needs from the treatment team regarding medications. Kimberly Harper stated needs and engaged in discussion.    Initial Therapeutic Activity: Counselor facilitated a check-in with Kimberly Harper to assess for safety, sobriety and medication compliance.  Counselor also inquired about Kimberly Harper's current emotional ratings, as well as any significant changes in thoughts, feelings or behavior since previous check in.  Kimberly Harper presented for session on time and was alert, oriented x5, with no evidence or self-report of active SI/HI or A/V H.  Kimberly Harper reported compliance with medication and denied use of alcohol or illicit substances.  Kimberly Harper reported scores of 3/10 for depression, 8/10 for anxiety, and 1/10 for anger/irritability.  Kimberly Harper denied any recent outbursts or panic attacks.  Kimberly Harper reported that a recent success was building a piece of furniture yesterday to be productive and feel a sense of accomplishment.  Kimberly Harper reported that her present struggle is dealing with relationship issues, stating That's causing me some anxiety.  Kimberly Harper reported that her goal today is to work on her taxes and visit the bank after group.         Second Therapeutic  Activity: Counselor covered topic of core beliefs with group today.  Counselor virtually shared a handout on the subject, which explained how everyone looks at the world differently, and two people can have the same experience, but have different interpretations of what happened.  Members were encouraged to think of these like sunglasses with different shades influencing perception towards positive or negative outcomes.  Examples of negative core beliefs were provided, such as I'm unlovable, I'm not good enough, and I'm a bad person.  Members were asked to share which one(s) they could relate to, and then identify evidence which contradicts these beliefs.  Counselor also provided psychoeducation on positive affirmations today.  Counselor explained how these are positive statements which can be spoken out loud or recited mentally to challenge negative thoughts and/or core beliefs to improve mood and outlook each day.  Counselor shared a comprehensive list of affirmations virtually to members with different categories, including ones for health, confidence, success, and happiness.  Counselor invited members to look through this list and identify any which resonated with them, and practice saying them out loud with sincerity.  Intervention was effective, as evidenced by Kimberly Harper successfully participating in discussion on the subject and reporting that she could relate to several negative core beliefs listed on the handout, such as I'm a failure, I'm worthless and I'm unlovable.  Kimberly Harper stated A lot of these examples sound exactly how I think.  If I make a mistake, I beat myself up over it and feel like everything is my fault.   Kimberly Harper was able to show progress in challenging negative core beliefs by identifying evidence to contradict the belief I am worthless, such as several  recent events where she has helped supports financially and emotionally with problems.   Kimberly Harper also reported that she  liked several of the positive affirmations listed, such as I love myself deeply and completely, I matter and what I have to offer to this world also matters, and Failure is great feedback.   Kimberly Harper stated I'm definitely going to implement some of these in my daily routine so I can keep working on my core beliefs.    Third Therapeutic Activity: Counselor invited members to engage in a voluntary guided imagery activity today for relaxation in order to improve mood and continue to increase available coping skills.  Counselor informed members beforehand that they could discontinue activity at any time should they become distressed.  Counselor guided members through process of getting comfortable, achieving relaxed breathing pattern, and then narrated visualization of walking across a beach setting, including various pleasant sensory details to enhance experience (I.e. sand under foot, smell of salt in air, sound of crashing waves, etc).  Counselor inquired about effectiveness of exercise afterward with each member, including their ability to visualize the scene, focus on narration, and impact this practice had upon mood.  Intervention effectiveness could not be measured, as Kimberly Harper reported that she had to take care of several tasks while this activity was going on and couldn't focus.    Assessment and Plan: Counselor recommends that Kimberly Harper remain in IOP treatment to better manage mental health symptoms, ensure stability and pursue completion of treatment plan goals. Counselor recommends adherence to crisis/safety plan, taking medications as prescribed, and following up with medical professionals if any issues arise.   Follow Up Instructions: Counselor will send Webex link for next session. Kimberly Harper was advised to call back or seek an in-person evaluation if the symptoms worsen or if the condition fails to improve as anticipated.   I provided 140 minutes of non-face-to-face time during this  encounter.   Noralee Stain, LCSW, LCAS 08/11/21

## 2021-08-12 ENCOUNTER — Other Ambulatory Visit: Payer: Self-pay

## 2021-08-12 ENCOUNTER — Other Ambulatory Visit (HOSPITAL_COMMUNITY): Payer: Medicare Other | Admitting: Licensed Clinical Social Worker

## 2021-08-12 DIAGNOSIS — F332 Major depressive disorder, recurrent severe without psychotic features: Secondary | ICD-10-CM

## 2021-08-12 NOTE — Progress Notes (Signed)
Virtual Visit via Video Note   I connected with Kimberly Harper on 08/12/21 at  9:00 AM EDT by a video enabled telemedicine application and verified that I am speaking with the correct person using two identifiers.   At orientation to the IOP program, Case Manager discussed the limitations of evaluation and management by telemedicine and the availability of in person appointments. The patient expressed understanding and agreed to proceed with virtual visits throughout the duration of the program.   Location:  Patient: Patient Home Provider: [facility]   History of Present Illness: MDD    Observations/Objective: Check In: Case Manager checked in with all participants to review discharge dates, insurance authorizations, work-related documents and needs from the treatment team regarding medications. Kimberly Harper stated needs and engaged in discussion.    Initial Therapeutic Activity: Counselor facilitated a check-in with Kimberly Harper to assess for safety, sobriety and medication compliance.  Counselor also inquired about Kimberly Harper's current emotional ratings, as well as any significant changes in thoughts, feelings or behavior since previous check in.  Kimberly Harper presented for session on time and was alert, oriented x5, with no evidence or self-report of active SI/HI or A/V H.  Kimberly Harper reported compliance with medication and denied use of alcohol or illicit substances.  Kimberly Harper reported scores of 0/10 for depression, 4/10 for anxiety, and 0/10 for anger/irritability.  Kimberly Harper denied any recent outbursts or panic attacks.  Kimberly Harper reported that a recent success was getting things done around the home, stating My mood has changed because of the medicine, so its helping me focus more.  Kimberly Harper denied any present struggles.  Kimberly Harper reported that her goal is to stay productive over the weekend, stating I have plenty to do and that should keep me busy.       Second Therapeutic Activity: Counselor  discussed topic of distress tolerance today with group.  Counselor shared virtual handout with members that offered a DBT approach represented by the acronym ACCEPTS, and outlined strategies for distracting oneself from distressing emotions, allowing appropriate time for these emotions to lesson in intensity and eventually fade away.  Strategies offered included engaging in positive activities, contributing to the wellbeing of others, comparing one's present situation to a previously difficult one to highlight resilience, using mental imagery, and physical grounding.  Counselor assisted members in creating their own realistic ACCEPTS plan for tackling a distressing emotion of their choice.  Intervention was effective, as evidenced by Kimberly Harper participating in creation of the plan, choosing 'anxiety' as her emotion of focus, and identifying several helpful approaches for distraction, such as listening to upbeat music or dancing to reggae or Latino songs, volunteering with a non-profit, comparing her past living space to the current, nicer one to highlight positive changes she has made, watching funny videos, using guided imagery to imagine the beach, or getting out of the home and taking a walk by a lake.  Assessment and Plan: Counselor recommends that Kimberly Harper remain in IOP treatment to better manage mental health symptoms, ensure stability and pursue completion of treatment plan goals. Counselor recommends adherence to crisis/safety plan, taking medications as prescribed, and following up with medical professionals if any issues arise.   Follow Up Instructions: Counselor will send Webex link for next session. Kimberly Harper was advised to call back or seek an in-person evaluation if the symptoms worsen or if the condition fails to improve as anticipated.   I provided 180 minutes of non-face-to-face time during this encounter.   Noralee Stain, LCSW, LCAS 08/12/21

## 2021-08-14 ENCOUNTER — Ambulatory Visit
Admission: EM | Admit: 2021-08-14 | Discharge: 2021-08-14 | Disposition: A | Discharge status: Home / Self Care | Payer: Medicare Other | Attending: Physician Assistant | Admitting: Physician Assistant

## 2021-08-14 ENCOUNTER — Other Ambulatory Visit: Payer: Self-pay

## 2021-08-14 DIAGNOSIS — N898 Other specified noninflammatory disorders of vagina: Secondary | ICD-10-CM | POA: Diagnosis not present

## 2021-08-14 DIAGNOSIS — Z202 Contact with and (suspected) exposure to infections with a predominantly sexual mode of transmission: Secondary | ICD-10-CM

## 2021-08-14 DIAGNOSIS — Z113 Encounter for screening for infections with a predominantly sexual mode of transmission: Secondary | ICD-10-CM | POA: Diagnosis not present

## 2021-08-14 LAB — POCT URINALYSIS DIP (MANUAL ENTRY)
Bilirubin, UA: NEGATIVE
Glucose, UA: NEGATIVE mg/dL
Ketones, POC UA: NEGATIVE mg/dL
Leukocytes, UA: NEGATIVE
Nitrite, UA: NEGATIVE
Spec Grav, UA: >=1.03 — AB (ref 1.010–1.025)
Urobilinogen, UA: 0.2 E.U./dL
pH, UA: 6 (ref 5.0–8.0)

## 2021-08-14 LAB — POCT URINE PREGNANCY: Preg Test, Ur: NEGATIVE

## 2021-08-14 MED ORDER — FLUCONAZOLE 150 MG PO TABS: 150.0000 mg | ORAL_TABLET | Freq: Once | ORAL | 0 refills | Status: AC

## 2021-08-14 MED ORDER — METRONIDAZOLE 500 MG PO TABS: 500.0000 mg | ORAL_TABLET | Freq: Two times a day (BID) | ORAL | 0 refills | Status: DC

## 2021-08-14 NOTE — ED Triage Notes (Signed)
2 day h/o vaginal discharge and odor. Denies vaginal itching and irritation. No meds taken.

## 2021-08-14 NOTE — ED Provider Notes (Signed)
Kimberly Harper    CSN: [id number] Arrival date & time: 08/14/21  1013      History   Chief Complaint Chief Complaint  Patient presents with   Vaginal Discharge    HPI Kimberly Harper is a 36 y.o. female.   Patient here today for evaluation of vaginal discharge with associated odor. She feels her symptoms are consistent with prior BV infections. She has not had any vaginal itching or irritation. She denies any genital lesions or rash. No known exposure to STD. She has not tried any treatment for symptoms.   The history is provided by the patient.  Vaginal Discharge Associated symptoms: no abdominal pain, no fever, no nausea and no vomiting    Past Medical History:  Diagnosis Date   Alcoholic ([place])    Anemia    [facility] 01/17/2020   Anxiety    Asthma    Bipolar 1 disorder (HCC)    Blood transfusion without reported diagnosis    [facility] 01/17/2020   Chronic headaches    Depression    [facility] 01/17/2020   Major depressive disorder    Substance abuse ([place])    [facility] 01/17/2020    Patient Active Problem List   Diagnosis Date Noted   Other allergic rhinitis 06/22/2021   Asthma 06/22/2021   Heartburn 06/22/2021   Panic disorder 04/12/2020   GAD (generalized anxiety disorder) 03/18/2020   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      Home Medications    Prior to Admission medications   Medication Sig Start Date End Date Taking? Authorizing Provider  fluconazole (DIFLUCAN) 150 MG tablet Take 1 tablet (150 mg total) by mouth once for 1 dose. 08/14/21 08/14/21 Yes  Francene Finders, PA-C  metroNIDAZOLE (FLAGYL) 500 MG tablet Take 1 tablet (500 mg total) by mouth 2 (two) times daily. 08/14/21  Yes Francene Finders, PA-C  ALPRAZolam Duanne Moron) 1 MG tablet Take 1 mg by mouth 2 (two) times daily as needed. 05/11/21   [provider]  famotidine (PEPCID) 20 MG tablet TAKE 1 TABLET(20 MG) BY MOUTH TWICE DAILY 07/06/21   Maximiano Coss, NP  fluticasone [facility]) 50 MCG/ACT nasal spray Place 1 spray into both nostrils 2 (two) times daily as needed (nasal congestion). 06/22/21   Garnet Sierras, DO  linaclotide (LINZESS) 145 MCG CAPS capsule Take 1 capsule (145 mcg total) by mouth daily before breakfast. If stools become loose, reduce 1/2 tablet daily); MUST CALL TO SCHEDULE APPT IN 1 MONTH Patient not taking: Reported on 08/05/2021 08/02/21   Thornton Park, MD  omeprazole (PRILOSEC) 40 MG capsule Take 1 capsule twice a day for 12 weeks then take 1 capsule daily 01/07/21   Thornton Park, MD  ondansetron (ZOFRAN) 4 MG tablet Take 1 tablet (4 mg total) by mouth every 8 (eight) hours as needed for nausea or vomiting. 08/01/21   Maximiano Coss, NP  pramipexole (MIRAPEX) 0.25 MG tablet Take by mouth. Patient not taking: Reported on 08/05/2021 05/11/21   [provider]  propranolol (INDERAL) 20  MG tablet TAKE 1 TABLET(20 MG) BY MOUTH DAILY 07/13/21   Janeece Agee, NP  sucralfate (CARAFATE) 1 GM/10ML suspension Take 10 mLs (1 g total) by mouth 4 (four) times daily -  with meals and at bedtime. Patient not taking: Reported on 08/05/2021 05/19/21   Janeece Agee, NP  vortioxetine HBr (TRINTELLIX) 20 MG TABS tablet Take 0.5 tablets (10 mg total) by mouth daily for 14 days, THEN 1 tablet (20 mg total) daily. 08/05/21 11/03/21  Janeece Agee, NP  montelukast (SINGULAIR) 10 MG tablet Take 1 tablet (10 mg total) by mouth at bedtime. 01/19/20 02/07/20  Janeece Agee, NP    Family History Family History  Problem Relation Age of Onset   Hypertension Mother     Diabetes Mother    Stroke Mother    Depression Mother    Depression Sister    Irritable bowel syndrome Sister    Asthma Brother    Allergic rhinitis Brother    Migraines Brother    Depression Brother    Stroke Maternal Grandmother    Diabetes Maternal Grandmother    Alcohol abuse Maternal Grandfather    Stroke Paternal Grandmother    Breast cancer Paternal Grandmother    Alcohol abuse Paternal Grandfather    Lupus Half-Sister    Angioedema Neg Hx    Eczema Neg Hx    Immunodeficiency Neg Hx    Atopy Neg Hx     Social History Social History   Tobacco Use   Smoking status: Former    Types: Cigarettes    Quit date: 09/07/2006    Years since quitting: 14.9   Smokeless tobacco: Never  Vaping Use   Vaping Use: Never used  Substance Use Topics   Alcohol use: Yes    Comment: now soically   Drug use: Not Currently    Types: Marijuana     Allergies   Multiple vitamins-iron   Review of Systems Review of Systems  Constitutional:  Positive for chills. Negative for fever.  Eyes:  Negative for discharge and redness.  Respiratory:  Negative for shortness of breath.   Gastrointestinal:  Negative for abdominal pain, nausea and vomiting.  Genitourinary:  Positive for vaginal discharge. Negative for genital sores.    Physical Exam Triage Vital Signs ED Triage Vitals  Enc Vitals Group     BP 08/14/21 1024 135/82     Pulse Rate 08/14/21 1024 63     Resp 08/14/21 1024 18     Temp 08/14/21 1024 98.2 F (36.8 C)     Temp Source 08/14/21 1024 Oral     SpO2 08/14/21 1024 98 %     Weight --      Height --      Head Circumference --      Peak Flow --      Pain Score 08/14/21 1025 3     Pain Loc --      Pain Edu? --      Excl. in GC? --    No data found.  Updated Vital Signs BP 135/82 (BP Location: Left Arm)    Pulse 63    Temp 98.2 F (36.8 C) (Oral)    Resp 18    LMP 08/10/2021 (Exact Date)    SpO2 98%      Physical Exam Vitals and nursing note reviewed.   Constitutional:      General: She is not in acute distress.    Appearance: Normal appearance. She is not ill-appearing.  HENT:  Head: Normocephalic and atraumatic.  Eyes:     Conjunctiva/sclera: Conjunctivae normal.  Cardiovascular:     Rate and Rhythm: Normal rate.  Pulmonary:     Effort: Pulmonary effort is normal.  Neurological:     Mental Status: She is alert.  Psychiatric:        Mood and Affect: Mood normal.        Behavior: Behavior normal.        Thought Content: Thought content normal.     UC Treatments / Results  Labs (all labs ordered are listed, but only abnormal results are displayed) Labs Reviewed  POCT URINALYSIS DIP (MANUAL ENTRY) - Abnormal; Notable for the following components:      Result Value   Spec Grav, UA >=1.030 (*)    Blood, UA trace-intact (*)    Protein Ur, POC trace (*)    All other components within normal limits  POCT URINE PREGNANCY  CERVICOVAGINAL ANCILLARY ONLY    EKG   Radiology No results found.  Procedures Procedures (including critical Harper time)  Medications Ordered in UC Medications - No data to display  Initial Impression / Assessment and Plan / UC Course  I have reviewed the triage vital signs and the nursing notes.  Pertinent labs & imaging results that were available during my Harper of the patient were reviewed by me and considered in my medical decision making (see chart for details).   UA without concerning findings. Will order metronidazole at patient's request while awaiting other screening results. Patient also reports typical yeast infection after taking metronidazole so will send in diflucan if needed. Encouraged follow up with any further concerns.   Final Clinical Impressions(s) / UC Diagnoses   Final diagnoses:  Screening for STD (sexually transmitted disease)   Discharge Instructions   None    ED Prescriptions     Medication Sig Dispense Auth. Provider   metroNIDAZOLE (FLAGYL) 500 MG tablet  Take 1 tablet (500 mg total) by mouth 2 (two) times daily. 14 tablet Ewell Poe F, PA-C   fluconazole (DIFLUCAN) 150 MG tablet Take 1 tablet (150 mg total) by mouth once for 1 dose. 1 tablet Francene Finders, PA-C      PDMP not reviewed this encounter.   Francene Finders, PA-C 08/14/21 1059

## 2021-08-15 ENCOUNTER — Other Ambulatory Visit (HOSPITAL_COMMUNITY): Payer: Medicare Other | Admitting: Licensed Clinical Social Worker

## 2021-08-15 DIAGNOSIS — F332 Major depressive disorder, recurrent severe without psychotic features: Secondary | ICD-10-CM

## 2021-08-15 LAB — CERVICOVAGINAL ANCILLARY ONLY
Bacterial Vaginitis (gardnerella): POSITIVE — AB
Candida Glabrata: NEGATIVE
Candida Vaginitis: NEGATIVE
Chlamydia: NEGATIVE
Comment: NEGATIVE
Comment: NEGATIVE
Comment: NEGATIVE
Comment: NEGATIVE
Comment: NEGATIVE
Comment: NORMAL
Neisseria Gonorrhea: NEGATIVE
Trichomonas: NEGATIVE
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
## 2021-08-16 ENCOUNTER — Other Ambulatory Visit: Payer: Self-pay

## 2021-08-16 ENCOUNTER — Other Ambulatory Visit (HOSPITAL_COMMUNITY): Payer: Medicare Other | Admitting: Psychiatry

## 2021-08-16 DIAGNOSIS — F332 Major depressive disorder, recurrent severe without psychotic features: Secondary | ICD-10-CM

## 2021-08-16 NOTE — Progress Notes (Signed)
Virtual Visit via Video Note   I connected with Kimberly Harper on 08/16/21 at  9:00 AM EDT by a video enabled telemedicine application and verified that I am speaking with the correct person using two identifiers.   At orientation to the IOP program, Case Manager discussed the limitations of evaluation and management by telemedicine and the availability of in person appointments. The patient expressed understanding and agreed to proceed with virtual visits throughout the duration of the program.   Location:  Patient: Patient Home Provider: [facility]   History of Present Illness: MDD    Observations/Objective: Check In: Case Manager checked in with all participants to review discharge dates, insurance authorizations, work-related documents and needs from the treatment team regarding medications. Kimberly Harper stated needs and engaged in discussion.    Initial Therapeutic Activity: Counselor facilitated a check-in with Kimberly Harper to assess for safety, sobriety and medication compliance.  Counselor also inquired about Kimberly Harper's current emotional ratings, as well as any significant changes in thoughts, feelings or behavior since previous check in.  Kimberly Harper presented for session on time and was alert, oriented x5, with no evidence or self-report of active SI/HI or A/V H.  Kimberly Harper reported compliance with medication and denied use of alcohol or illicit substances.  Kimberly Harper reported scores of 5/10 for depression, 7/10 for anxiety, and 0/10 for anger/irritability.  Kimberly Harper denied any recent outbursts or panic attacks.  Kimberly Harper reported that a recent success was taking time to rest yesterday for self-care, stating My memory has been 'choppy' over the past couple of days, so I felt like I needed to take a break.  Kimberly Harper reported that her current struggle is finding a balance between rest and work each day.  Kimberly Harper reported that her goal today is to set up a new doorbell after group, and research  disability approval process.        Second Therapeutic Activity: Counselor introduced [employer], [place] Chaplain to provide psychoeducation on topic of Grief and Loss with members today.  Kimberly Harper began discussion by checking in with the group about their baseline mood today, general thoughts on what grief means to them and how it has affected them personally in the past.  Kimberly Harper provided information on how the process of grief/loss can differ depending upon one's unique culture, and categories of loss one could experience (i.e. loss of a person, animal, relationship, job, identity, etc).  Kimberly Harper encouraged members to be mindful of how pervasive loss can be, and how to recognize signs which could indicate that this is having an impact on one's overall mental health and wellbeing.  Intervention was effective, as evidenced by Kimberly Harper participating in discussion with speaker on the subject, reporting that although she is not currently grieving, she would deal with loss by ensuring time for activities that bring her joy, such as spending time with her son, playing with her dog, watching funny videos or movies, and spending time outdoors.    Third Therapeutic Activity: Counselor introduced topic of grounding skills today.  Counselor defined these as simple strategies one can use to help detach from difficult thoughts or feelings temporarily by focusing on something else.  Counselor noted that grounding will not solve the problem at hand, but can provide the practitioner with time to regain control over their thoughts and/or feelings and prevent the situation from getting worse (i.e. interrupting a panic attack).  Counselor divided these into three categories (mental, physical, and soothing) and then provided examples of each which group  members could practice during session.  Some of these included describing one's environment in detail or playing a categories game with oneself for mental category, taking a hot  bath/shower, stretching, or carrying a grounding object for physical category, and saying kind statements, or visualizing people one cares about for soothing category.  Counselor inquired about which techniques members have used with success in the past, or will commit to learning, practicing, and applying now to improve coping abilities.  Intervention was effective, as evidenced by Kimberly Harper participating in discussion on the subject, trying out several of the techniques during session, and expressing interest in adding several to her available coping skills, such as describing her environment in detail, playing a categories game involving listing reggae artists, deep breathing, visualizing a visit to the beach, watching funny social media videos to make her laugh, progressive muscle relaxation, touching nearby objects to physically distract herself, starting a yoga routine, thinking about her loved ones, and saying kind statements to herself such as You're a strong person and will get through this.    Assessment and Plan: Counselor recommends that Kimberly Harper remain in IOP treatment to better manage mental health symptoms, ensure stability and pursue completion of treatment plan goals. Counselor recommends adherence to crisis/safety plan, taking medications as prescribed, and following up with medical professionals if any issues arise.   Follow Up Instructions: Counselor will send Webex link for next session. Kimberly Harper was advised to call back or seek an in-person evaluation if the symptoms worsen or if the condition fails to improve as anticipated.   I provided 180 minutes of non-face-to-face time during this encounter.   Noralee Stain, [place], LCAS 08/16/21

## 2021-08-17 ENCOUNTER — Ambulatory Visit (HOSPITAL_COMMUNITY): Payer: Medicare Other

## 2021-08-17 ENCOUNTER — Telehealth (HOSPITAL_COMMUNITY): Payer: Self-pay | Admitting: Psychiatry

## 2021-08-17 NOTE — Telephone Encounter (Signed)
D: Patient typed in the chat area that she wasn't feeling well today and logged off.  A:  Placed call to check on patient but there was no answer.  Left vm requesting pt to call the case manager back if she like or case mgr will see her in virtual group tomorrow.  Inform Hillery Jacks, NP.

## 2021-08-18 ENCOUNTER — Ambulatory Visit (INDEPENDENT_AMBULATORY_CARE_PROVIDER_SITE_OTHER): Payer: Medicare Other | Admitting: Nurse Practitioner

## 2021-08-18 ENCOUNTER — Other Ambulatory Visit: Payer: Self-pay

## 2021-08-18 ENCOUNTER — Other Ambulatory Visit (HOSPITAL_COMMUNITY): Payer: Medicare Other | Admitting: Psychiatry

## 2021-08-18 ENCOUNTER — Encounter: Payer: Self-pay | Admitting: Nurse Practitioner

## 2021-08-18 VITALS — BP 96/60 | HR 64 | Ht 64.0 in | Wt 130.0 lb

## 2021-08-18 DIAGNOSIS — R0989 Other specified symptoms and signs involving the circulatory and respiratory systems: Secondary | ICD-10-CM

## 2021-08-18 DIAGNOSIS — F332 Major depressive disorder, recurrent severe without psychotic features: Secondary | ICD-10-CM | POA: Diagnosis not present

## 2021-08-18 DIAGNOSIS — R110 Nausea: Secondary | ICD-10-CM | POA: Diagnosis not present

## 2021-08-18 DIAGNOSIS — R1033 Periumbilical pain: Secondary | ICD-10-CM | POA: Diagnosis not present

## 2021-08-18 MED ORDER — SUCRALFATE 1 GM/10ML PO SUSP: 1.0000 g | Freq: Two times a day (BID) | ORAL | 3 refills | Status: DC

## 2021-08-18 MED ORDER — HYOSCYAMINE SULFATE 0.125 MG SL SUBL: 0.1250 mg | SUBLINGUAL_TABLET | Freq: Three times a day (TID) | SUBLINGUAL | 0 refills | Status: DC | PRN

## 2021-08-18 NOTE — Patient Instructions (Signed)
D:  Patient will complete MH-IOP tomorrow.  A:  Patient is requesting to follow up with PHP.  Encouraged support groups.  R:  Pt receptive.

## 2021-08-18 NOTE — Patient Instructions (Signed)
We have scheduled you a follow up with Dr. Orvan Falconer on 09/15/21 at 3:20 pm.  We have given you samples of the following medication to take:  Linzess 72 mcg. Take 1 capsule every morning before breakfast. If this works well for you, contact us so we can send in a prescription.  Stop Linzess .  MEDICATION: We have sent the following medication to your pharmacy for you to pick up at your convenience: Hyoscyamine (LEVSIN SL) 0.125 MG SL tablet: Place 1 tablet (0.125 mg total) under the tongue every 8 hours as needed. Carafate 1 gram before breakfast and dinner.  It was great seeing you today! Thank you for entrusting me with your care and choosing [facility].  Willette Cluster, NP  The [facility] providers would like to encourage you to use [facility] to communicate with providers for non-urgent requests or questions.  Due to long hold times on the telephone, sending your provider a message by [facility] may be faster and more efficient way to get a response. Please allow 48 business hours for a response.  Please remember that this is for non-urgent requests/questions.  If you are age 72 or older, your body mass index should be between 23-30. Your Body mass index is 22.31 kg/m. If this is out of the aforementioned range listed, please consider follow up with your Primary Care Provider.  If you are age 25 or younger, your body mass index should be between 19-25. Your Body mass index is 22.31 kg/m. If this is out of the aformentioned range listed, please consider follow up with your Primary Care Provider.

## 2021-08-18 NOTE — Progress Notes (Signed)
Virtual Visit via Video Note   I connected with Kimberly Harper on 08/18/21 at  9:00 AM EDT by a video enabled telemedicine application and verified that I am speaking with the correct person using two identifiers.   At orientation to the IOP program, Case Manager discussed the limitations of evaluation and management by telemedicine and the availability of in person appointments. The patient expressed understanding and agreed to proceed with virtual visits throughout the duration of the program.   Location:  Patient: Patient Home Provider: [facility]   History of Present Illness: MDD    Observations/Objective: Check In: Case Manager checked in with all participants to review discharge dates, insurance authorizations, work-related documents and needs from the treatment team regarding medications. Kimberly Harper stated needs and engaged in discussion.    Initial Therapeutic Activity: Counselor facilitated a check-in with Kimberly Harper to assess for safety, sobriety and medication compliance.  Counselor also inquired about Kimberly Harper's current emotional ratings, as well as any significant changes in thoughts, feelings or behavior since previous check in.  Kimberly Harper presented for session on time and was alert, oriented x5, with no evidence or self-report of active SI/HI or A/V H.  Kimberly Harper reported compliance with medication and denied use of alcohol or illicit substances.  Kimberly Harper reported scores of 7/10 for depression, 7/10 for anxiety, and 7/10 for anger/irritability.  Kimberly Harper denied any recent outbursts or panic attacks.  Kimberly Harper reported that a current struggle today was trying to focus on group while assisting her son with attendance to a medical visit.  She declined offer to be excused from group to focus fully on this other appointment.      Second Therapeutic Activity: Counselor introduced topic of anger management today.  Counselor virtually shared a handout with members on this subject featuring a  variety of coping skills, and facilitated discussion on these approaches.  Examples included raising awareness of anger triggers, practicing deep breathing, keeping an anger log to better understand episodes, using diversion activities to distract oneself for 30 minutes, taking a time out when necessary, and being mindful of warning signs tied to thoughts or behavior.  Counselor inquired about which techniques group members have used before, what has proved to be helpful, what their unique warning signs might be, as well as what they will try out in the future to assist with de-escalation.  Intervention was effective, as evidenced by Kimberly Harper actively participating in discussion on subject, reporting that she considers anger management to be an important topic for her, since she has experienced numerous consequences in the past as a result of uncontrolled anger, including frequent headaches, sleep disruption, damaging property, punching walls, and arrests.  Kimberly Harper reported that she tends to wear an emotional mask to hide difficult feelings that contribute to her anger, such as loneliness, insecurity, disappointment, and frustration.  Kimberly Harper stated I don't want people to see me cry so I turn it into anger.  Kimberly Harper reported that at one point in time, she attempted to use alcohol to numb feelings like this, but acknowledged that this only led to more problems.  Kimberly Harper reported that she would be open to creating an anger management plan to cope more effectively with daily triggers, including documenting significant events/outbursts in an 'anger journal', as well as establishing firmer boundaries with people that are not respectful towards her needs and feelings.     Assessment and Plan: Counselor recommends that Kimberly Harper remain in IOP treatment to better manage mental health symptoms, ensure stability and pursue  completion of treatment plan goals. Counselor recommends adherence to crisis/safety plan, taking  medications as prescribed, and following up with medical professionals if any issues arise.   Follow Up Instructions: Counselor will send Webex link for next session. Kimberly Harper was advised to call back or seek an in-person evaluation if the symptoms worsen or if the condition fails to improve as anticipated.   I provided 180 minutes of non-face-to-face time during this encounter.   Noralee Stain, [place], LCAS 08/18/21

## 2021-08-18 NOTE — Progress Notes (Signed)
ASSESSMENT AND PLAN    #36 yo female with chronic nausea and post-prandial periumbilical cramping. Temporarily got better with BID PPI, now with recurrent periumbilical cramping. Unclear etiology. Workup has included [place], EGD, CT scan, labs, colonoscopy  ( and other abdominal imaging) several years back in [place] . She is frustrated by lack of answers.  --Dicyclomine caused nausea. She is agreeable to a trial of Levsin SL Q 8 hours prn cramping -- She never did get the prescription of Carafate called in by her PCP. Start 1 gram ac and HS.  --She stopped Famotidine, didn't feel it was helping.  --Continue BID Omeprazole as symptoms worse on daily dosing --She has a schedule appt with Dr. Tarri Glenn on 2/10. Doesn't need to be seen back this soon, will look for something late February or early March  # ? Constipation. Stool throughout colon on CT scan. Linzess 145 mcg causing diarrhea, she wants a lower dose.  --Will try lower dose of Linzess (72 mcg) on empty stomach. Samples given  #Subcentimeter right hepatic lobe hypodensity too small to characterize --For MRI in ~ 6 months to further evaluate the lesion   HISTORY OF PRESENT ILLNESS    Chief Complaint :  periumbilical pain, nausea  Kimberly Harper is a 36 y.o. female with a past medical history of anxiety, major depressive disorder, borderline personality disorder, panic disorder, suicidal ideation and attempted overdose, body dysmorphia.  Additional medical history as listed in [place] .   Patient is known to Dr.  Tarri Glenn, she has been seen here several times for evaluation of nausea, abdominal pain  06/28/21 office visit for follow-up on abdominal pain, nausea, anorexia, unintentional weight loss, recent concerns about mold. Twice daily omeprazole improved symptoms. She was unable to taper to daily dosing due to recurrent symptoms. She cannot tolerate Dicyclomine- causes nausea.  Having increased anxiety and stress.  Psychiatrist had  prescribed Mirapex but she was not taking it on a daily basis.  GI symptoms thought to possibly be a manifestation of her anxiety.  Ultrasound nor EGD were revealing.  Plan was to continue omeprazole twice daily, famotidine 20 mg twice daily, add Carafate 1 g 4 times daily, obtain CT scan of the abdomen and pelvis with contrast, and follow-up with psychiatry.  CT scan suggested constipation and showed small liver lesion. Rx for Linzess called in . MRI will be done in 6 months to evaluate liver lesions.   INTERVAL HISTORY:   She stopped famotidine last month, found it to be useless. She never did fill the Rx for carafate prescribed by PCP. She did start the linzess 145 mg daily but it gives her watery diarrhea so he isn't taking it on a daily basis. She is interested in a lower dose.   The cramping had improved after starting Omeprazole in June but now with recurrent symptoms after meals. This occurs with almost every meal but some foods are worse than others such as pizza.  The cramping last about an hour. She wants to find out what is going on, not just cover it up with medication. Currently taking flagyl prescribed by GYN    LABORATORY DATA  Hepatic Function Latest Ref Rng & Units 04/12/2021 01/20/2021 03/19/2020  Total Protein 6.0 - 8.3 g/dL 7.6 7.3 7.5  Albumin 3.5 - 5.2 g/dL 4.4 4.5 4.7  AST 0 - 37 U/L _0 ALT 0 - 35 U/L _1 Alk Phosphatase 39 - 117 U/L 52  43 60  Total Bilirubin 0.2 - 1.2 mg/dL 1.0 0.4 0.6    CBC Latest Ref Rng & Units 04/12/2021 01/20/2021 12/15/2020  WBC 4.0 - 10.5 K/uL 7.3 9.7 -  Hemoglobin 12.0 - 15.0 g/dL 12.2 12.0 12.2  Hematocrit 36.0 - 46.0 % 37.7 36.8 36.0  Platelets 150.0 - 400.0 K/uL 277.0 366.0 -    Lab Results  Component Value Date   LIPASE 42 03/19/2020     PREVIOUS IMAGING   Abdominal ultrasound 09/16/20  mild increased liver parenchymal echogenicity, but no acute changes.  [facility] Kitchen   CTAP w/ contrast -- Constipation --Subcentimeter right  hepatic lobe hypodensity too small to characterize   PREVIOUS ENDOSCOPIC EVALUATIONS    EGD 01/04/21 showed fundic gland polyps, normal gastric biopsies, and duodenitis. Esophageal biopsies were normal.    Past Medical History:  Diagnosis Date   Alcoholic ([place])    Anemia    [employer] 01/17/2020   Anxiety    Asthma    Bipolar 1 disorder ([place])    Blood transfusion without reported diagnosis    [employer] 01/17/2020   Chronic headaches    Depression    [employer] 01/17/2020   Major depressive disorder    Substance abuse ([place])    [employer] 01/17/2020    Past Surgical History:  Procedure Laterality Date   CESAREAN SECTION N/A    [employer] 01/17/2020   COLONOSCOPY     earlier in year 2022   DILATION AND CURETTAGE OF UTERUS     HAND SURGERY Right    WISDOM TOOTH EXTRACTION        Current Medications, Allergies, Family History and Social History were reviewed in [facility] record.     Current Outpatient Medications  Medication Sig Dispense Refill   ALPRAZolam (XANAX) 1 MG tablet Take 1 mg by mouth 2 (two) times daily as needed.     linaclotide (LINZESS) 145 MCG CAPS capsule Take 1 capsule (145 mcg total) by mouth daily before breakfast. If stools become loose, reduce 1/2 tablet daily); MUST CALL TO SCHEDULE APPT IN 1 MONTH 30 capsule 0   metroNIDAZOLE (FLAGYL) 500 MG tablet Take 1 tablet (500 mg total) by mouth 2 (two) times daily. 14 tablet 0   omeprazole (PRILOSEC) 40 MG capsule Take 1 capsule twice a day for 12 weeks then take 1 capsule daily 60 capsule 6   ondansetron (ZOFRAN) 4 MG tablet Take 1 tablet (4 mg total) by mouth every 8 (eight) hours as needed for nausea or vomiting. 50 tablet 0   pramipexole (MIRAPEX) 0.25 MG tablet Take by mouth.     propranolol (INDERAL) 20 MG tablet TAKE 1 TABLET(20 MG) BY MOUTH DAILY 90 tablet 0   No current facility-administered medications for this visit.    Review of Systems: No chest pain. No shortness of breath.  No urinary complaints.   PHYSICAL EXAM :    Wt Readings from Last 3 Encounters:  08/18/21 130 lb (59 kg)  08/05/21 130 lb (59 kg)  06/28/21 136 lb 12.8 oz (62.1 kg)    BP 96/60 (BP Location: Left Arm, Patient Position: Sitting, Cuff Size: Normal)    Pulse 64    Ht _0  (1.626 m)    Wt 130 lb (59 kg)    LMP 08/10/2021 (Exact Date)    BMI 22.31 kg/m  Constitutional:  Generally well appearing female in no acute distress. Psychiatric: Normal mood and affect. Behavior is normal. EENT: Pupils normal.  Conjunctivae are normal. No scleral icterus.  Neck supple.  Cardiovascular: Normal rate, regular rhythm. No edema Pulmonary/chest: Effort normal and breath sounds normal. No wheezing, rales or rhonchi. Abdominal: Soft, nondistended, nontender. Bowel sounds active throughout. There are no masses palpable. No hepatomegaly. Neurological: Alert and oriented to person place and time. Skin: Skin is warm and dry. No rashes noted.  Tye Savoy, NP  08/18/2021, 2:19 PM

## 2021-08-19 ENCOUNTER — Other Ambulatory Visit (HOSPITAL_BASED_OUTPATIENT_CLINIC_OR_DEPARTMENT_OTHER): Payer: Medicare Other | Admitting: Family

## 2021-08-19 DIAGNOSIS — F332 Major depressive disorder, recurrent severe without psychotic features: Secondary | ICD-10-CM | POA: Diagnosis not present

## 2021-08-19 NOTE — Progress Notes (Signed)
Virtual Visit via Telephone Note  I connected with Kimberly Harper on @TODAY @ at  9:00 AM EST by telephone and verified that I am speaking with the correct person using two identifiers.  Location: Patient: at home Provider: at home office   I discussed the limitations, risks, security and privacy concerns of performing an evaluation and management service by telephone and the availability of in person appointments. I also discussed with the patient that there may be a patient responsible charge related to this service. The patient expressed understanding and agreed to proceed.  I discussed the assessment and treatment plan with the patient. The patient was provided an opportunity to ask questions and all were answered. The patient agreed with the plan and demonstrated an understanding of the instructions.   The patient was advised to call back or seek an in-person evaluation if the symptoms worsen or if the condition fails to improve as anticipated.  I provided 20 minutes of non-face-to-face time during this encounter.   , M.Ed,CNA   Patient ID: Kimberly Harper, female   DOB: Nov 12, 1985, 36 y.o.   MRN: [id number] CC previous CCA; pt previously in MH-IOP in June 2021. Pt was inpt in 2021 due to increased SI. Pt reported multiple hospitalizations- about 1 per year since [age]yr old due to SI. Pt reports she is having a mixed episode of mania and depression. Pt admits to SI and gestures.  Pt admits to superficial cuts to her left arm; most recent 2 1/2 weeks ago.  Denies any stitches.  Pt reports the following stressors: 1) On Christmas Eve; recently moved from one place to another.  Reports having a dispute with the new place over a charge.  2) Strained finances:  "I owe debt to several companies; including my psychiatrist and therapist office."  3) Relationship/trust issues.  Reports going through a bad relationship last yr.  4) No support system.  5)  35 yr old last yr in  school  He struggles with anxiety, ADHD, and depression.  Also vapes.  "He doesn't want me to get him any help with any of this." Patient attended MH-IOP back in June 2021; transitioned from [facility] (attended only one day).  Attended all scheduled days, except for three days.  Towards the end of her attending, pt c/o GI issues.  Pt struggled with mood lability while in MH-IOP.     Pt attended 13 days out of the 15 which were authorized.  Pt again struggled with mood lability.  Difficulty focusing on the group; had a lot of distractions.  Every morning whenever pt wold log on she would be in her car transporting her son to school; therefore she would state that she couldn't talk/share b/c he was in the car.  Once pt dropped him off she was then driving back home.  Whenever pt settled down at home, she was a very active participant then. Pt is asking if she can be referred to [facility] now.  Explained to pt d/t insurance purposes one can't just decide on their last day that they need a higher level of care.  When asked why did pt feel that she needed a higher level?  Pt responded that she feels like her moods are "up and down."  "I feel a little rough.  I feel manicky."  Pt denies SI at this moment.  "I still have relationship stuff going on.  I have no support."  Expressed the importance of pt getting back in with  her therapist and psychiatrist at [facility] as soon as possible.  On a scale of 1-10 (10 being the worst) pt rates her depression and anxiety at a 7.  Denies HI or A/V hallucinations.  A:  Discussed safety options at length with pt.  Encouraged pt to go to [facility] when in a crisis or call suicide hotline [phone] if needed.  Provided pt with [facility] information if she wants to explore PHP option further.  D/C pt today.  F/U with [facility].  Strongly recommended [employer] [facility].  R:  Patient receptive.  Jeri Modena, M.Ed, CNA

## 2021-08-19 NOTE — Plan of Care (Signed)
Pt will improve her mood as evidenced by being happy again, managing her mood and coping with daily stressors for 5 out of 7 days for 60 days. °

## 2021-08-19 NOTE — Progress Notes (Signed)
Virtual Visit via Video Note   I connected with Clovis Fredrickson on 08/19/21 at  9:00 AM EDT by a video enabled telemedicine application and verified that I am speaking with the correct person using two identifiers.   At orientation to the IOP program, Case Manager discussed the limitations of evaluation and management by telemedicine and the availability of in person appointments. The patient expressed understanding and agreed to proceed with virtual visits throughout the duration of the program.   Location:  Patient: Patient Home Provider: [facility] Office   History of Present Illness: MDD    Observations/Objective: Check In: Case Manager checked in with all participants to review discharge dates, insurance authorizations, work-related documents and needs from the treatment team regarding medications. [facility] stated needs and engaged in discussion.    Initial Therapeutic Activity: Counselor facilitated a check-in with Karmin to assess for safety, sobriety and medication compliance.  Counselor also inquired about Kyle's current emotional ratings, as well as any significant changes in thoughts, feelings or behavior since previous check in.  Sanaia presented for session on time and was alert, oriented x5, with no evidence or self-report of active SI/HI or A/V H.  Yasmeen reported compliance with medication and denied use of alcohol or illicit substances.  Michalene reported scores of 7/10 for depression, 7/10 for anxiety, and 4/10 for anger/irritability.  Eutha denied any recent outbursts or panic attacks.  Sidra reported that a recent struggle was dealing with high levels of anger yesterday, stating This person I'm dating is emotionally disconnected.  I have been feeling up and down about that, and hinted that I wasn't okay to see if he would ask about how I was feeling.  Aaleigha reported that they spoke about this argument again this morning and smoothed things over, but she is now  considering if the relationship is sustainable.  Aesha was receptive to feedback and support from the group.      Second Therapeutic Activity: Counselor discussed topic of gratitude journaling with members as a form of self-care.  Counselor virtually shared a handout with the group today which explained the benefits of this practice, including reduction in stress, increased happiness, and self-esteem.  Tips were also provided to aid in practice, such as taking time with entries, writing about people one is grateful for, and setting goal for two entries per week for at least 10-20 minutes at a time.  Counselor also provided group members with a variety of journaling prompts to choose from today, and encouraged each member to take time to write about something they are grateful for, with examples such as Something beautiful I recently saw was., Something I can be proud of is, A reason to be excited for the future is and more.  Members were encouraged to share their entry with the group, along with their perspective on the activity and motivation level towards making this a habit.  Intervention was effective, as evidenced by [employer] participating in journaling activity, and choosing the prompt An experience I feel lucky to have had and A valuable lesson I learned was...  Kimeka expressed gratitude for having the opportunity to try tasty foods from other cultures, such as [place], in addition to learning valuable lessons about managing money and building credit in order to ensure financial stability for the future.   Iva reported that it can be difficult for her to journal regularly, so this was a helpful reminder to get back into the habit to ensure healthy emotional outlet.  She reported that she would plan to set reminders and place the journal somewhere visible each morning.    Third Therapeutic Activity: Psycho-educational portion of group was provided by Virgina Evener, Interior and spatial designer of  community education with [employer].  [name] provided information on history of her local agency, mission statement, and the variety of unique services offered which group members might find beneficial to engage in, including both virtual and in-person support groups, as well as peer support program for mentoring.  [name] offered time to answer member's questions regarding services and encouraged them to consider utilizing these services to assist in working towards their individual wellness goals.  Intervention was effective, as evidenced by Itzy participating in discussion with speaker on the subject, and reporting that several of the support groups are appealing to her, so she will plan to follow up with [facility] to begin services upon discharge from [facility] today.    Assessment and Plan: Avalee has completed [facility] at this time.  Counselor recommends adherence to crisis/safety plan, taking medications as prescribed, and following up with medical professionals if any issues arise.   Follow Up Instructions: Meher was advised to call back or seek an in-person evaluation if the symptoms worsen or if the condition fails to improve as anticipated.   I provided 180 minutes of non-face-to-face time during this encounter.   Noralee Stain, [place], LCAS 08/19/21

## 2021-08-19 NOTE — Progress Notes (Signed)
Virtual Visit via Video Note  I connected with Kimberly Harper on 08/21/21 at  9:00 AM EST by a video enabled telemedicine application and verified that I am speaking with the correct person using two identifiers.  Location: Patient: Home Provider: Office   I discussed the limitations of evaluation and management by telemedicine and the availability of in person appointments. The patient expressed understanding and agreed to proceed.   I discussed the assessment and treatment plan with the patient. The patient was provided an opportunity to ask questions and all were answered. The patient agreed with the plan and demonstrated an understanding of the instructions.   The patient was advised to call back or seek an in-person evaluation if the symptoms worsen or if the condition fails to improve as anticipated.  I provided 15 minutes of non-face-to-face time during this encounter.   Kimberly Rack, NP   [facility] Intensive Outpatient Program Discharge Summary  Kimberly Harper [id number]  Admission date: 08/03/2021 Discharge date: 08/21/2021  Reason for admission: per admission assessment note: Kimberly Harper is a 36 year old African-American female who presents due to worsening depression and anxiety.  Patient is very minimal throughout this assessment states she has been experiencing intense intrusive thoughts for self-harm.  She denied plan or intent.  States she was followed by the mood treatment center however due to financial constraints she has been able to follow-up for medication management.  States she was prescribed Xanax for her anxiety and panic disorder.  She reports low motivation, symptoms of worry and worsening depression.  Does report financial concerns however did not elaborate on stressors.  Reports occasional alcohol use.  Denied illicit drug use or substance abuse history.    Progress in Program Toward Treatment Goals: Ongoing, patient attended  treatment session with active and engaged participation.  Denying suicidal or homicidal ideation.  Denies auditory or visual hallucinations.  Reports overall her mood has been stabilized since attending intensive outpatient programming.  It was reported patient is requesting to step up to partial hospitalization programming.   Progress (rationale):  Kimberly Harper reported she was able to clear her death with mood treatment center and is now able to return for additional outpatient services.  Case management provided additional outpatient resources for group treatment.   Take all medications as prescribed. Keep all follow-up appointments as scheduled.  Do not consume alcohol or use illegal drugs while on prescription medications. Report any adverse effects from your medications to your primary care provider promptly.  In the event of recurrent symptoms or worsening symptoms, call 911, a crisis hotline, or go to the nearest emergency department for evaluation.    Kimberly Rack, NP 08/19/2021

## 2021-08-22 ENCOUNTER — Ambulatory Visit (HOSPITAL_COMMUNITY): Payer: Self-pay

## 2021-08-23 ENCOUNTER — Encounter (HOSPITAL_COMMUNITY): Payer: Self-pay | Admitting: Family

## 2021-08-23 ENCOUNTER — Other Ambulatory Visit: Payer: Self-pay | Admitting: Gastroenterology

## 2021-08-25 ENCOUNTER — Other Ambulatory Visit: Payer: Self-pay

## 2021-08-25 DIAGNOSIS — R109 Unspecified abdominal pain: Secondary | ICD-10-CM

## 2021-08-25 MED ORDER — OMEPRAZOLE 40 MG PO CPDR: 40.0000 mg | DELAYED_RELEASE_CAPSULE | Freq: Two times a day (BID) | ORAL | 2 refills | Status: DC

## 2021-08-28 NOTE — Progress Notes (Signed)
Reviewed and agree with management plans. ? ?Kaytlynn Kochan L. Aislee Landgren, MD, MPH  ?

## 2021-08-30 ENCOUNTER — Other Ambulatory Visit: Payer: Self-pay | Admitting: Gastroenterology

## 2021-08-30 DIAGNOSIS — R109 Unspecified abdominal pain: Secondary | ICD-10-CM

## 2021-09-02 ENCOUNTER — Ambulatory Visit: Payer: Medicare Other | Admitting: Gastroenterology

## 2021-09-14 ENCOUNTER — Other Ambulatory Visit: Payer: Self-pay | Admitting: Nurse Practitioner

## 2021-09-14 DIAGNOSIS — R110 Nausea: Secondary | ICD-10-CM

## 2021-09-15 ENCOUNTER — Ambulatory Visit: Payer: Medicare Other | Admitting: Gastroenterology

## 2021-09-19 ENCOUNTER — Other Ambulatory Visit: Payer: Self-pay | Admitting: Registered Nurse

## 2021-09-21 ENCOUNTER — Other Ambulatory Visit: Payer: Self-pay | Admitting: Registered Nurse

## 2021-09-21 DIAGNOSIS — F411 Generalized anxiety disorder: Secondary | ICD-10-CM

## 2021-09-21 MED ORDER — ALPRAZOLAM 1 MG PO TABS: 1.0000 mg | ORAL_TABLET | Freq: Two times a day (BID) | ORAL | 0 refills | Status: DC | PRN

## 2021-09-21 MED ORDER — PRAMIPEXOLE DIHYDROCHLORIDE 0.25 MG PO TABS: 0.2500 mg | ORAL_TABLET | Freq: Three times a day (TID) | ORAL | 0 refills | Status: DC

## 2021-09-21 NOTE — Telephone Encounter (Signed)
Patient is requesting a refill of the following medications: ?Requested Prescriptions  ? ?Pending Prescriptions Disp Refills  ? ALPRAZolam (XANAX) 1 MG tablet 30 tablet   ?  Sig: Take 1 tablet (1 mg total) by mouth 2 (two) times daily as needed.  ? pramipexole (MIRAPEX) 0.25 MG tablet    ?  Sig: Take by mouth.  ? ? ?Date of patient request: 09/19/2021 ?Last office visit: 05/19/2021 ?Date of last refill: 10/19/202 ?Last refill amount:  ?Follow up time period per chart:  ? ?

## 2021-09-22 ENCOUNTER — Other Ambulatory Visit: Payer: Self-pay | Admitting: Nurse Practitioner

## 2021-09-22 DIAGNOSIS — R110 Nausea: Secondary | ICD-10-CM

## 2021-09-30 ENCOUNTER — Other Ambulatory Visit: Payer: Self-pay | Admitting: Nurse Practitioner

## 2021-09-30 DIAGNOSIS — R110 Nausea: Secondary | ICD-10-CM

## 2021-10-07 ENCOUNTER — Other Ambulatory Visit: Payer: Self-pay

## 2021-10-07 ENCOUNTER — Ambulatory Visit
Admission: EM | Admit: 2021-10-07 | Discharge: 2021-10-07 | Disposition: A | Discharge status: Home / Self Care | Payer: Medicare Other | Attending: Physician Assistant | Admitting: Physician Assistant

## 2021-10-07 DIAGNOSIS — N898 Other specified noninflammatory disorders of vagina: Secondary | ICD-10-CM | POA: Diagnosis present

## 2021-10-07 LAB — POCT URINALYSIS DIP (MANUAL ENTRY)
Bilirubin, UA: NEGATIVE
Blood, UA: NEGATIVE
Glucose, UA: NEGATIVE mg/dL
Ketones, POC UA: NEGATIVE mg/dL
Leukocytes, UA: NEGATIVE
Nitrite, UA: NEGATIVE
Protein Ur, POC: NEGATIVE mg/dL
Spec Grav, UA: 1.01 (ref 1.010–1.025)
Urobilinogen, UA: 0.2 E.U./dL
pH, UA: 5 (ref 5.0–8.0)

## 2021-10-07 LAB — POCT URINE PREGNANCY: Preg Test, Ur: NEGATIVE

## 2021-10-07 MED ORDER — FLUCONAZOLE 150 MG PO TABS: 150.0000 mg | ORAL_TABLET | Freq: Once | ORAL | 0 refills | Status: AC

## 2021-10-07 NOTE — ED Provider Notes (Signed)
?[name] ? ? ? ?CSN: [id number] ?Arrival date & time: 10/07/21  1035 ? ? ?  ? ?History   ?Chief Complaint ?Chief Complaint  ?Patient presents with  ? Vaginal Itching  ? SEXUALLY TRANSMITTED DISEASE  ? ? ?HPI ?Kimberly Estelle GrumblesJenelle Harper is a 36 y.o. female.  ? ?Patient here today for evaluation of vaginal itching for the last 3 days.  She reports minimal discharge.  She would like STD screening.  She does have history of BV and yeast. ? ?On initial questioning patient does admit to suicidal ideation at times.  She is currently under the care of a psychiatrist and denies any current suicidal intention or plans. ? ?The history is provided by the patient.  ? ?Past Medical History:  ?Diagnosis Date  ? Alcoholic (HCC)   ? Anemia   ? [employer] 01/17/2020  ? Anxiety   ? Asthma   ? Bipolar 1 disorder (HCC)   ? Blood transfusion without reported diagnosis   ? [employer] 01/17/2020  ? Chronic headaches   ? Depression   ? [employer] 01/17/2020  ? Major depressive disorder   ? Substance abuse (HCC)   ? [employer] 01/17/2020  ? ? ?Patient Active Problem List  ? Diagnosis Date Noted  ? Other allergic rhinitis 06/22/2021  ? Asthma 06/22/2021  ? Heartburn 06/22/2021  ? Panic disorder 04/12/2020  ? GAD (generalized anxiety disorder) 03/18/2020  ? 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 Borderline personality disorder (HCC) 02/06/2020  ? Bipolar 2 disorder, major depressive episode (HCC) 12/06/2019  ? ? ?Past Surgical History:  ?Procedure Laterality Date  ? CESAREAN SECTION N/A   ? [employer] 01/17/2020  ? COLONOSCOPY    ? earlier in year 2022  ? DILATION AND CURETTAGE OF UTERUS    ? HAND SURGERY Right   ? WISDOM TOOTH EXTRACTION    ? ? ?OB History   ?No obstetric history on file. ?  ? ? ? ?Home Medications   ? ?Prior to Admission medications   ?Medication Sig Start Date End Date Taking? Authorizing Provider  ?fluconazole (DIFLUCAN) 150 MG tablet Take 1 tablet (150 mg total) by mouth once for 1 dose. 10/07/21 10/07/21 Yes Tomi BambergerMyers, Kayden Amend F,  PA-C  ?ALPRAZolam (XANAX) 1 MG tablet Take 1 tablet (1 mg total) by mouth 2 (two) times daily as needed. 09/21/21   Janeece AgeeMorrow, Richard, NP  ?hyoscyamine (LEVSIN SL) 0.125 MG SL tablet DISSOLVE 1 TABLET(0.125 MG) UNDER THE TONGUE EVERY 8 HOURS AS NEEDED FOR CRAMPING 09/30/21   Meredith PelGuenther, Paula M, NP  ?metroNIDAZOLE (FLAGYL) 500 MG tablet Take 1 tablet (500 mg total) by mouth 2 (two) times daily. 08/14/21   Tomi BambergerMyers, Kire Ferg F, PA-C  ?omeprazole (PRILOSEC) 40 MG capsule Take 1 capsule (40 mg total) by mouth 2 (two) times daily. 08/25/21   Tressia DanasBeavers, Kimberly, MD  ?ondansetron (ZOFRAN) 4 MG tablet Take 1 tablet (4 mg total) by mouth every 8 (eight) hours as needed for nausea or vomiting. 08/01/21   Janeece AgeeMorrow, Richard, NP  ?pramipexole (MIRAPEX) 0.25 MG tablet Take 1 tablet (0.25 mg total) by mouth 3 (three) times daily. 09/21/21   Janeece AgeeMorrow, Richard, NP  ?propranolol (INDERAL) 20 MG tablet TAKE 1 TABLET(20 MG) BY MOUTH DAILY 07/13/21   Janeece AgeeMorrow, Richard, NP  ?sucralfate (CARAFATE) 1 GM/10ML suspension Take 10 mLs (1 g total) by mouth 2 (two) times daily. 08/18/21   Meredith PelGuenther, Paula M, NP  ?montelukast (SINGULAIR) 10 MG tablet Take 1 tablet (10 mg total) by mouth at bedtime. 01/19/20 02/07/20  Kateri PlummerMorrow,  Richard, NP  ? ? ?Family History ?Family History  ?Problem Relation Age of Onset  ? Hypertension Mother   ? Diabetes Mother   ? Stroke Mother   ? Depression Mother   ? Depression Sister   ? Irritable bowel syndrome Sister   ? Asthma Brother   ? Allergic rhinitis Brother   ? Migraines Brother   ? Depression Brother   ? Stroke Maternal Grandmother   ? Diabetes Maternal Grandmother   ? Alcohol abuse Maternal Grandfather   ? Stroke Paternal Grandmother   ? Breast cancer Paternal Grandmother   ? Alcohol abuse Paternal Grandfather   ? Lupus Half-Sister   ? Angioedema Neg Hx   ? Eczema Neg Hx   ? Immunodeficiency Neg Hx   ? Atopy Neg Hx   ? ? ?Social History ?Social History  ? ?Tobacco Use  ? Smoking status: Former  ?  Types: Cigarettes  ?  Quit date:  09/07/2006  ?  Years since quitting: 15.0  ? Smokeless tobacco: Never  ?Vaping Use  ? Vaping Use: Never used  ?Substance Use Topics  ? Alcohol use: Yes  ?  Comment: now soically  ? Drug use: Not Currently  ?  Types: Marijuana  ? ? ? ?Allergies   ?Multiple vitamins-iron ? ? ?Review of Systems ?Review of Systems  ?Constitutional:  Negative for chills and fever.  ?Eyes:  Negative for discharge and redness.  ?Gastrointestinal:  Negative for abdominal pain, nausea and vomiting.  ?Genitourinary:  Positive for vaginal discharge. Negative for vaginal bleeding.  ? ? ?Physical Exam ?Triage Vital Signs ?ED Triage Vitals  ?Enc Vitals Group  ?   BP   ?   Pulse   ?   Resp   ?   Temp   ?   Temp src   ?   SpO2   ?   Weight   ?   Height   ?   Head Circumference   ?   Peak Flow   ?   Pain Score   ?   Pain Loc   ?   Pain Edu?   ?   Excl. in GC?   ? ?No data found. ? ?Updated Vital Signs ?BP 121/78 (BP Location: Right Arm)   Pulse 62   Temp 98.5 ?F (36.9 ?C) (Oral)   Resp 16   LMP 09/05/2021   SpO2 98%  ?   ? ?Physical Exam ?Vitals and nursing note reviewed.  ?Constitutional:   ?   General: She is not in acute distress. ?   Appearance: Normal appearance. She is not ill-appearing.  ?HENT:  ?   Head: Normocephalic and atraumatic.  ?Eyes:  ?   Conjunctiva/sclera: Conjunctivae normal.  ?Cardiovascular:  ?   Rate and Rhythm: Normal rate.  ?Pulmonary:  ?   Effort: Pulmonary effort is normal.  ?Neurological:  ?   Mental Status: She is alert.  ?Psychiatric:     ?   Mood and Affect: Mood normal.     ?   Behavior: Behavior normal.     ?   Thought Content: Thought content normal.  ? ? ? ?UC Treatments / Results  ?Labs ?(all labs ordered are listed, but only abnormal results are displayed) ?Labs Reviewed  ?POCT URINALYSIS DIP (MANUAL ENTRY)  ?POCT URINE PREGNANCY  ?CERVICOVAGINAL ANCILLARY ONLY  ? ? ?EKG ? ? ?Radiology ?No results found. ? ?Procedures ?Procedures (including critical care time) ? ?Medications Ordered in UC ?Medications - No  data to  display ? ?Initial Impression / Assessment and Plan / UC Course  ?I have reviewed the triage vital signs and the nursing notes. ? ?Pertinent labs & imaging results that were available during my care of the patient were reviewed by me and considered in my medical decision making (see chart for details). ? ?  ?We will treat with Diflucan to cover yeast, and will order STD screening as requested.  Will await results further recommendation.  Encouraged follow-up with any further concerns. ? ?Patient contracts that she is not currently suicidal and does have appropriate follow-up and contacts should she need urgent access to mental health. ? ?Final Clinical Impressions(s) / UC Diagnoses  ? ?Final diagnoses:  ?Vaginal itching  ? ?Discharge Instructions   ?None ?  ? ?ED Prescriptions   ? ? Medication Sig Dispense Auth. Provider  ? fluconazole (DIFLUCAN) 150 MG tablet Take 1 tablet (150 mg total) by mouth once for 1 dose. 1 tablet Tomi Bamberger, PA-C  ? ?  ? ?PDMP not reviewed this encounter. ?  ?Tomi Bamberger, PA-C ?10/07/21 1138 ? ?

## 2021-10-07 NOTE — ED Notes (Signed)
Provider notified of patients SI screening response.  ?

## 2021-10-07 NOTE — ED Triage Notes (Signed)
Patient presents to Urgent Care with complaints of vaginal rash x 3 days. Pt requesting STD panel. Pt states she has a hx of bv/yeast.  ?

## 2021-10-08 ENCOUNTER — Other Ambulatory Visit: Payer: Self-pay | Admitting: Nurse Practitioner

## 2021-10-08 DIAGNOSIS — R110 Nausea: Secondary | ICD-10-CM

## 2021-10-10 LAB — CERVICOVAGINAL ANCILLARY ONLY
Bacterial Vaginitis (gardnerella): NEGATIVE
Candida Glabrata: NEGATIVE
Candida Vaginitis: POSITIVE — AB
Chlamydia: NEGATIVE
Comment: NEGATIVE
Comment: NEGATIVE
Comment: NEGATIVE
Comment: NEGATIVE
Comment: NEGATIVE
Comment: NORMAL
Neisseria Gonorrhea: NEGATIVE
Trichomonas: NEGATIVE

## 2021-10-11 ENCOUNTER — Telehealth: Payer: Self-pay | Admitting: Emergency Medicine

## 2021-10-11 MED ORDER — FLUCONAZOLE 150 MG PO TABS: 150.0000 mg | ORAL_TABLET | Freq: Every day | ORAL | 0 refills | Status: AC

## 2021-10-14 ENCOUNTER — Ambulatory Visit
Admission: EM | Admit: 2021-10-14 | Discharge: 2021-10-14 | Disposition: A | Discharge status: Home / Self Care | Payer: Medicare Other | Attending: Physician Assistant | Admitting: Physician Assistant

## 2021-10-14 ENCOUNTER — Other Ambulatory Visit: Payer: Self-pay

## 2021-10-14 DIAGNOSIS — N898 Other specified noninflammatory disorders of vagina: Secondary | ICD-10-CM | POA: Insufficient documentation

## 2021-10-14 DIAGNOSIS — K602 Anal fissure, unspecified: Secondary | ICD-10-CM | POA: Diagnosis present

## 2021-10-14 MED ORDER — NITROGLYCERIN 0.4 % RE OINT: 1.0000 "application " | TOPICAL_OINTMENT | Freq: Two times a day (BID) | RECTAL | 0 refills | Status: DC

## 2021-10-14 NOTE — ED Provider Notes (Signed)
?[name] ? ? ? ?CSN: [id number] ?Arrival date & time: 10/14/21  0840 ? ? ?  ? ?History   ?Chief Complaint ?Chief Complaint  ?Patient presents with  ? situation/stuff  ? ? ?HPI ?Kimberly Harper is a 36 y.o. female.  ? ?Patient presents today with a 10-day history of vaginal irritation.  She reports associated pruritus as well as skin lesions around her anus.  She denies any significant vaginal discharge but is currently on her menstrual cycle.  Denies pelvic pain, abdominal pain, fever, nausea, vomiting.  She is currently on her menstrual cycle and confident that she is not pregnant.  She is sexually active but does not have anal intercourse.  She is in with the same partner for the past several months and has no concern for specific STI but is open to testing.  She was seen by our clinic on 10/07/2021 with similar symptoms at which point she was prescribed Diflucan.  Her STI swab did grow Candida but despite a total of 3 doses she continues to have symptoms.  Denies any changes to personal hygiene products including soaps or detergents.  She is interested in HPV testing today. ? ? ?Past Medical History:  ?Diagnosis Date  ? Alcoholic (HCC)   ? Anemia   ? Phreesia 01/17/2020  ? Anxiety   ? Asthma   ? Bipolar 1 disorder (HCC)   ? Blood transfusion without reported diagnosis   ? Phreesia 01/17/2020  ? Chronic headaches   ? Depression   ? Phreesia 01/17/2020  ? Major depressive disorder   ? Substance abuse (HCC)   ? Phreesia 01/17/2020  ? ? ?Patient Active Problem List  ? Diagnosis Date Noted  ? Other allergic rhinitis 06/22/2021  ? Asthma 06/22/2021  ? Heartburn 06/22/2021  ? Panic disorder 04/12/2020  ? GAD (generalized anxiety disorder) 03/18/2020  ? 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 Borderline personality disorder (HCC) 02/06/2020  ? Bipolar 2 disorder, major depressive episode (HCC) 12/06/2019  ? ? ?Past Surgical History:  ?Procedure Laterality Date  ? CESAREAN SECTION N/A   ? Phreesia  01/17/2020  ? COLONOSCOPY    ? earlier in year 2022  ? DILATION AND CURETTAGE OF UTERUS    ? HAND SURGERY Right   ? WISDOM TOOTH EXTRACTION    ? ? ?OB History   ?No obstetric history on file. ?  ? ? ? ?Home Medications   ? ?Prior to Admission medications   ?Medication Sig Start Date End Date Taking? Authorizing Provider  ?Nitroglycerin 0.4 % OINT Place 1 application. rectally in the morning and at bedtime. 10/14/21  Yes Oluwafemi Villella, Noberto Retort, PA-C  ?ALPRAZolam (XANAX) 1 MG tablet Take 1 tablet (1 mg total) by mouth 2 (two) times daily as needed. 09/21/21   Janeece Agee, NP  ?hyoscyamine (LEVSIN SL) 0.125 MG SL tablet DISSOLVE 1 TABLET(0.125 MG) UNDER THE TONGUE EVERY 8 HOURS AS NEEDED FOR CRAMPING 09/30/21   Meredith Pel, NP  ?metroNIDAZOLE (FLAGYL) 500 MG tablet Take 1 tablet (500 mg total) by mouth 2 (two) times daily. 08/14/21   Tomi Bamberger, PA-C  ?omeprazole (PRILOSEC) 40 MG capsule Take 1 capsule (40 mg total) by mouth 2 (two) times daily. 08/25/21   Tressia Danas, MD  ?ondansetron (ZOFRAN) 4 MG tablet Take 1 tablet (4 mg total) by mouth every 8 (eight) hours as needed for nausea or vomiting. 08/01/21   Janeece Agee, NP  ?pramipexole (MIRAPEX) 0.25 MG tablet Take 1 tablet (0.25 mg total) by  mouth 3 (three) times daily. 09/21/21   Janeece Agee, NP  ?propranolol (INDERAL) 20 MG tablet TAKE 1 TABLET(20 MG) BY MOUTH DAILY 07/13/21   Janeece Agee, NP  ?sucralfate (CARAFATE) 1 GM/10ML suspension Take 10 mLs (1 g total) by mouth 2 (two) times daily. 08/18/21   Meredith Pel, NP  ?montelukast (SINGULAIR) 10 MG tablet Take 1 tablet (10 mg total) by mouth at bedtime. 01/19/20 02/07/20  Janeece Agee, NP  ? ? ?Family History ?Family History  ?Problem Relation Age of Onset  ? Hypertension Mother   ? Diabetes Mother   ? Stroke Mother   ? Depression Mother   ? Depression Sister   ? Irritable bowel syndrome Sister   ? Asthma Brother   ? Allergic rhinitis Brother   ? Migraines Brother   ? Depression Brother   ?  Stroke Maternal Grandmother   ? Diabetes Maternal Grandmother   ? Alcohol abuse Maternal Grandfather   ? Stroke Paternal Grandmother   ? Breast cancer Paternal Grandmother   ? Alcohol abuse Paternal Grandfather   ? Lupus Half-Sister   ? Angioedema Neg Hx   ? Eczema Neg Hx   ? Immunodeficiency Neg Hx   ? Atopy Neg Hx   ? ? ?Social History ?Social History  ? ?Tobacco Use  ? Smoking status: Former  ?  Types: Cigarettes  ?  Quit date: 09/07/2006  ?  Years since quitting: 15.1  ? Smokeless tobacco: Never  ?Vaping Use  ? Vaping Use: Never used  ?Substance Use Topics  ? Alcohol use: Yes  ?  Comment: now soically  ? Drug use: Not Currently  ?  Types: Marijuana  ? ? ? ?Allergies   ?Multiple vitamins-iron ? ? ?Review of Systems ?Review of Systems  ?Constitutional:  Positive for activity change. Negative for appetite change, fatigue and fever.  ?Respiratory:  Negative for cough and shortness of breath.   ?Cardiovascular:  Negative for chest pain.  ?Gastrointestinal:  Negative for abdominal pain, diarrhea, nausea and vomiting.  ?Genitourinary:  Positive for genital sores, vaginal bleeding and vaginal pain (Irritation). Negative for dysuria, frequency, menstrual problem, pelvic pain, urgency and vaginal discharge.  ?Neurological:  Negative for dizziness, light-headedness and headaches.  ? ? ?Physical Exam ?Triage Vital Signs ?ED Triage Vitals  ?Enc Vitals Group  ?   BP 10/14/21 0940 109/76  ?   Pulse Rate 10/14/21 0940 90  ?   Resp 10/14/21 0940 18  ?   Temp 10/14/21 0940 98.7 ?F (37.1 ?C)  ?   Temp Source 10/14/21 0940 Oral  ?   SpO2 10/14/21 0940 99 %  ?   Weight --   ?   Height --   ?   Head Circumference --   ?   Peak Flow --   ?   Pain Score 10/14/21 0941 0  ?   Pain Loc --   ?   Pain Edu? --   ?   Excl. in GC? --   ? ?No data found. ? ?Updated Vital Signs ?BP 109/76 (BP Location: Left Arm)   Pulse 90   Temp 98.7 ?F (37.1 ?C) (Oral)   Resp 18   SpO2 99%  ? ?Visual Acuity ?Right Eye Distance:   ?Left Eye Distance:    ?Bilateral Distance:   ? ?Right Eye Near:   ?Left Eye Near:    ?Bilateral Near:    ? ?Physical Exam ?Vitals reviewed.  ?Constitutional:   ?   General: She is awake.  She is not in acute distress. ?   Appearance: Normal appearance. She is well-developed. She is not ill-appearing.  ?   Comments: Very pleasant female appears stated age in no acute distress sitting comfortably in exam room  ?HENT:  ?   Head: Normocephalic and atraumatic.  ?Cardiovascular:  ?   Rate and Rhythm: Normal rate and regular rhythm.  ?   Heart sounds: Normal heart sounds, S1 normal and S2 normal. No murmur heard. ?Pulmonary:  ?   Effort: Pulmonary effort is normal.  ?   Breath sounds: Normal breath sounds. No wheezing, rhonchi or rales.  ?   Comments: Clear to auscultation bilaterally ?Abdominal:  ?   General: Bowel sounds are normal.  ?   Palpations: Abdomen is soft.  ?   Tenderness: There is no abdominal tenderness. There is no right CVA tenderness, left CVA tenderness, guarding or rebound.  ?Genitourinary: ?   Labia:     ?   Right: No rash or tenderness.     ?   Left: No rash or tenderness.   ?   Comments: 2 anal fissures noted at 11:00 and 1 o'clock position. ?Psychiatric:     ?   Behavior: Behavior is cooperative.  ? ? ? ?UC Treatments / Results  ?Labs ?(all labs ordered are listed, but only abnormal results are displayed) ?Labs Reviewed  ?HSV CULTURE AND TYPING  ?CERVICOVAGINAL ANCILLARY ONLY  ? ? ?EKG ? ? ?Radiology ?No results found. ? ?Procedures ?Procedures (including critical care time) ? ?Medications Ordered in UC ?Medications - No data to display ? ?Initial Impression / Assessment and Plan / UC Course  ?I have reviewed the triage vital signs and the nursing notes. ? ?Pertinent labs & imaging results that were available during my care of the patient were reviewed by me and considered in my medical decision making (see chart for details). ? ?  ? ?Unclear etiology of symptoms.  Discussed that rectal irritation is likely related to  fissure noted on exam.  She was started on nitroglycerin ointment to be used twice daily for minimum of several weeks.  Also discussed conservative treatment measures including avoiding constipation and using sitz bath for sym

## 2021-10-14 NOTE — ED Triage Notes (Signed)
Pt c/o vaginal irritation, pruritis, dysuria, skin lesions to anus, vaginal discharge  ? ?Denies frequency, hematuria, pelvic pain, new lower back pain,  ? ?Onset ~ last Wednesday  ? ?Requests HPV testing. States has been seen by UC and tried 3 tablets of diflucan without relief.  ?

## 2021-10-14 NOTE — Discharge Instructions (Addendum)
You have a tear in the rectal tissue known as anal fissure.  Please apply ointment twice daily to help with healing.  Avoid constipation.  Use sitz bath as we discussed.  Make sure to wear loosefitting cotton underwear and use hypoallergenic soaps and detergents.  We will contact you if need to range additional treatment based on your swab results.  Please follow-up with OB/GYN for further evaluation and management if symptoms persist. ?

## 2021-10-17 LAB — CERVICOVAGINAL ANCILLARY ONLY
Bacterial Vaginitis (gardnerella): NEGATIVE
Candida Glabrata: NEGATIVE
Candida Vaginitis: NEGATIVE
Chlamydia: NEGATIVE
Comment: NEGATIVE
Comment: NEGATIVE
Comment: NEGATIVE
Comment: NEGATIVE
Comment: NEGATIVE
Comment: NORMAL
Neisseria Gonorrhea: NEGATIVE
Trichomonas: NEGATIVE

## 2021-10-17 LAB — HSV CULTURE AND TYPING

## 2021-10-21 ENCOUNTER — Ambulatory Visit
Admission: EM | Admit: 2021-10-21 | Discharge: 2021-10-21 | Disposition: A | Discharge status: Home / Self Care | Payer: Medicare Other | Attending: Physician Assistant | Admitting: Physician Assistant

## 2021-10-21 ENCOUNTER — Encounter: Payer: Self-pay | Admitting: Physician Assistant

## 2021-10-21 DIAGNOSIS — N898 Other specified noninflammatory disorders of vagina: Secondary | ICD-10-CM | POA: Insufficient documentation

## 2021-10-21 MED ORDER — METRONIDAZOLE 500 MG PO TABS: 500.0000 mg | ORAL_TABLET | Freq: Two times a day (BID) | ORAL | 0 refills | Status: DC

## 2021-10-21 MED ORDER — FLUCONAZOLE 150 MG PO TABS
150.0000 mg | ORAL_TABLET | Freq: Once | ORAL | 0 refills | Status: AC
Start: 2021-10-21 — End: 2021-10-21

## 2021-10-21 NOTE — ED Triage Notes (Signed)
Pt presents with vaginal discharge and odor X 3 days. ?

## 2021-10-21 NOTE — ED Provider Notes (Signed)
?[name] ? ? ? ?CSN: [id number] ?Arrival date & time: 10/21/21  [id number] ? ? ?  ? ?History   ?Chief Complaint ?Chief Complaint  ?Patient presents with  ? Vaginal Discharge  ? ? ?HPI ?Kimberly Estelle GrumblesJenelle Harper is a 36 y.o. female.  ? ?Patient here today for evaluation of vaginal discharge and odor that started 3 days ago. She was recently on her menstrual period and states that after she developed discharge and odor. She has not had any other symptoms.  ? ?The history is provided by the patient.  ?Vaginal Discharge ?Associated symptoms: no abdominal pain, no fever, no nausea and no vomiting   ? ?Past Medical History:  ?Diagnosis Date  ? Alcoholic (HCC)   ? Anemia   ? Phreesia 01/17/2020  ? Anxiety   ? Asthma   ? Bipolar 1 disorder (HCC)   ? Blood transfusion without reported diagnosis   ? Phreesia 01/17/2020  ? Chronic headaches   ? Depression   ? Phreesia 01/17/2020  ? Major depressive disorder   ? Substance abuse (HCC)   ? Phreesia 01/17/2020  ? ? ?Patient Active Problem List  ? Diagnosis Date Noted  ? Other allergic rhinitis 06/22/2021  ? Asthma 06/22/2021  ? Heartburn 06/22/2021  ? Panic disorder 04/12/2020  ? GAD (generalized anxiety disorder) 03/18/2020  ? 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 Borderline personality disorder (HCC) 02/06/2020  ? Bipolar 2 disorder, major depressive episode (HCC) 12/06/2019  ? ? ?Past Surgical History:  ?Procedure Laterality Date  ? CESAREAN SECTION N/A   ? Phreesia 01/17/2020  ? COLONOSCOPY    ? earlier in year 2022  ? DILATION AND CURETTAGE OF UTERUS    ? HAND SURGERY Right   ? WISDOM TOOTH EXTRACTION    ? ? ?OB History   ?No obstetric history on file. ?  ? ? ? ?Home Medications   ? ?Prior to Admission medications   ?Medication Sig Start Date End Date Taking? Authorizing Provider  ?fluconazole (DIFLUCAN) 150 MG tablet Take 1 tablet (150 mg total) by mouth once for 1 dose. 10/21/21 10/21/21 Yes Tomi BambergerMyers, Bonner Larue F, PA-C  ?metroNIDAZOLE (FLAGYL) 500 MG tablet Take 1 tablet (500 mg  total) by mouth 2 (two) times daily. 10/21/21  Yes Tomi BambergerMyers, Kortny Lirette F, PA-C  ?ALPRAZolam (XANAX) 1 MG tablet Take 1 tablet (1 mg total) by mouth 2 (two) times daily as needed. 09/21/21   Janeece AgeeMorrow, Richard, NP  ?hyoscyamine (LEVSIN SL) 0.125 MG SL tablet DISSOLVE 1 TABLET(0.125 MG) UNDER THE TONGUE EVERY 8 HOURS AS NEEDED FOR CRAMPING 09/30/21   Meredith PelGuenther, Paula M, NP  ?Nitroglycerin 0.4 % OINT Place 1 application. rectally in the morning and at bedtime. 10/14/21   Raspet, Noberto RetortErin K, PA-C  ?omeprazole (PRILOSEC) 40 MG capsule Take 1 capsule (40 mg total) by mouth 2 (two) times daily. 08/25/21   Tressia DanasBeavers, Kimberly, MD  ?ondansetron (ZOFRAN) 4 MG tablet Take 1 tablet (4 mg total) by mouth every 8 (eight) hours as needed for nausea or vomiting. 08/01/21   Janeece AgeeMorrow, Richard, NP  ?pramipexole (MIRAPEX) 0.25 MG tablet Take 1 tablet (0.25 mg total) by mouth 3 (three) times daily. 09/21/21   Janeece AgeeMorrow, Richard, NP  ?propranolol (INDERAL) 20 MG tablet TAKE 1 TABLET(20 MG) BY MOUTH DAILY 07/13/21   Janeece AgeeMorrow, Richard, NP  ?sucralfate (CARAFATE) 1 GM/10ML suspension Take 10 mLs (1 g total) by mouth 2 (two) times daily. 08/18/21   Meredith PelGuenther, Paula M, NP  ?montelukast (SINGULAIR) 10 MG tablet Take 1 tablet (10 mg  total) by mouth at bedtime. 01/19/20 02/07/20  Janeece Agee, NP  ? ? ?Family History ?Family History  ?Problem Relation Age of Onset  ? Hypertension Mother   ? Diabetes Mother   ? Stroke Mother   ? Depression Mother   ? Depression Sister   ? Irritable bowel syndrome Sister   ? Asthma Brother   ? Allergic rhinitis Brother   ? Migraines Brother   ? Depression Brother   ? Stroke Maternal Grandmother   ? Diabetes Maternal Grandmother   ? Alcohol abuse Maternal Grandfather   ? Stroke Paternal Grandmother   ? Breast cancer Paternal Grandmother   ? Alcohol abuse Paternal Grandfather   ? Lupus Half-Sister   ? Angioedema Neg Hx   ? Eczema Neg Hx   ? Immunodeficiency Neg Hx   ? Atopy Neg Hx   ? ? ?Social History ?Social History  ? ?Tobacco Use  ? Smoking  status: Former  ?  Types: Cigarettes  ?  Quit date: 09/07/2006  ?  Years since quitting: 15.1  ? Smokeless tobacco: Never  ?Vaping Use  ? Vaping Use: Never used  ?Substance Use Topics  ? Alcohol use: Yes  ?  Comment: now soically  ? Drug use: Not Currently  ?  Types: Marijuana  ? ? ? ?Allergies   ?Multiple vitamins-iron ? ? ?Review of Systems ?Review of Systems  ?Constitutional:  Negative for chills and fever.  ?Eyes:  Negative for discharge and redness.  ?Respiratory:  Negative for shortness of breath.   ?Gastrointestinal:  Negative for abdominal pain, nausea and vomiting.  ?Genitourinary:  Positive for vaginal discharge. Negative for vaginal bleeding.  ? ? ?Physical Exam ?Triage Vital Signs ?ED Triage Vitals [10/21/21 0936]  ?Enc Vitals Group  ?   BP 114/77  ?   Pulse Rate 66  ?   Resp 18  ?   Temp 98.1 ?F (36.7 ?C)  ?   Temp Source Oral  ?   SpO2 99 %  ?   Weight   ?   Height   ?   Head Circumference   ?   Peak Flow   ?   Pain Score 1  ?   Pain Loc   ?   Pain Edu?   ?   Excl. in GC?   ? ?No data found. ? ?Updated Vital Signs ?BP 114/77 (BP Location: Left Arm)   Pulse 66   Temp 98.1 ?F (36.7 ?C) (Oral)   Resp 18   LMP 10/11/2021   SpO2 99%  ?   ? ?Physical Exam ?Vitals and nursing note reviewed.  ?Constitutional:   ?   General: She is not in acute distress. ?   Appearance: Normal appearance. She is not ill-appearing.  ?HENT:  ?   Head: Normocephalic and atraumatic.  ?Eyes:  ?   Conjunctiva/sclera: Conjunctivae normal.  ?Cardiovascular:  ?   Rate and Rhythm: Normal rate.  ?Pulmonary:  ?   Effort: Pulmonary effort is normal.  ?Neurological:  ?   Mental Status: She is alert.  ?Psychiatric:     ?   Mood and Affect: Mood normal.     ?   Behavior: Behavior normal.     ?   Thought Content: Thought content normal.  ? ? ? ?UC Treatments / Results  ?Labs ?(all labs ordered are listed, but only abnormal results are displayed) ?Labs Reviewed  ?CERVICOVAGINAL ANCILLARY ONLY  ? ? ?EKG ? ? ?Radiology ?No results  found. ? ?Procedures ?  Procedures (including critical care time) ? ?Medications Ordered in UC ?Medications - No data to display ? ?Initial Impression / Assessment and Plan / UC Course  ?I have reviewed the triage vital signs and the nursing notes. ? ?Pertinent labs & imaging results that were available during my care of the patient were reviewed by me and considered in my medical decision making (see chart for details). ? ? Flagyl and diflucan prescribed to cover yeast and BV as she has had issues with same in the past. Vaginal swab ordered for further screening. Patient declines pregnancy testing. Encouraged follow up with GYN given frequency of vaginal issues. Patient expresses understanding.  ? ? ?Final Clinical Impressions(s) / UC Diagnoses  ? ?Final diagnoses:  ?Vaginal discharge  ? ?Discharge Instructions   ?None ?  ? ?ED Prescriptions   ? ? Medication Sig Dispense Auth. Provider  ? metroNIDAZOLE (FLAGYL) 500 MG tablet Take 1 tablet (500 mg total) by mouth 2 (two) times daily. 14 tablet Erma Pinto F, PA-C  ? fluconazole (DIFLUCAN) 150 MG tablet Take 1 tablet (150 mg total) by mouth once for 1 dose. 1 tablet Tomi Bamberger, PA-C  ? ?  ? ?PDMP not reviewed this encounter. ?  ?Tomi Bamberger, PA-C ?10/21/21 1020 ? ?

## 2021-10-24 LAB — CERVICOVAGINAL ANCILLARY ONLY
Bacterial Vaginitis (gardnerella): POSITIVE — AB
Candida Glabrata: NEGATIVE
Candida Vaginitis: NEGATIVE
Chlamydia: NEGATIVE
Comment: NEGATIVE
Comment: NEGATIVE
Comment: NEGATIVE
Comment: NEGATIVE
Comment: NEGATIVE
Comment: NORMAL
Neisseria Gonorrhea: NEGATIVE
Trichomonas: NEGATIVE

## 2021-10-30 ENCOUNTER — Other Ambulatory Visit: Payer: Self-pay | Admitting: Registered Nurse

## 2021-10-30 DIAGNOSIS — G43829 Menstrual migraine, not intractable, without status migrainosus: Secondary | ICD-10-CM

## 2021-10-30 DIAGNOSIS — F411 Generalized anxiety disorder: Secondary | ICD-10-CM

## 2021-10-30 DIAGNOSIS — F410 Panic disorder [episodic paroxysmal anxiety] without agoraphobia: Secondary | ICD-10-CM

## 2021-10-31 MED ORDER — PROPRANOLOL HCL 20 MG PO TABS: 20.0000 mg | ORAL_TABLET | Freq: Every day | ORAL | 0 refills | Status: DC

## 2021-11-01 ENCOUNTER — Telehealth: Payer: Self-pay | Admitting: Gastroenterology

## 2021-11-01 NOTE — Telephone Encounter (Signed)
error 

## 2021-11-02 ENCOUNTER — Encounter: Payer: Self-pay | Admitting: Registered Nurse

## 2021-11-02 ENCOUNTER — Ambulatory Visit (INDEPENDENT_AMBULATORY_CARE_PROVIDER_SITE_OTHER): Payer: Medicare Other | Admitting: Registered Nurse

## 2021-11-02 VITALS — BP 109/78 | HR 90 | Temp 98.0°F | Resp 18 | Ht 64.0 in | Wt 121.4 lb

## 2021-11-02 DIAGNOSIS — R634 Abnormal weight loss: Secondary | ICD-10-CM

## 2021-11-02 DIAGNOSIS — G8929 Other chronic pain: Secondary | ICD-10-CM

## 2021-11-02 DIAGNOSIS — E559 Vitamin D deficiency, unspecified: Secondary | ICD-10-CM | POA: Diagnosis not present

## 2021-11-02 DIAGNOSIS — R109 Unspecified abdominal pain: Secondary | ICD-10-CM

## 2021-11-02 DIAGNOSIS — R110 Nausea: Secondary | ICD-10-CM

## 2021-11-02 LAB — CBC WITH DIFFERENTIAL/PLATELET
Basophils Absolute: 0 10*3/uL (ref 0.0–0.1)
Basophils Relative: 0.4 % (ref 0.0–3.0)
Eosinophils Absolute: 0.2 10*3/uL (ref 0.0–0.7)
Eosinophils Relative: 2.2 % (ref 0.0–5.0)
HCT: 39.2 % (ref 36.0–46.0)
Hemoglobin: 12.7 g/dL (ref 12.0–15.0)
Lymphocytes Relative: 47.4 % — ABNORMAL HIGH (ref 12.0–46.0)
Lymphs Abs: 3.8 10*3/uL (ref 0.7–4.0)
MCHC: 32.4 g/dL (ref 30.0–36.0)
MCV: 79.6 fl (ref 78.0–100.0)
Monocytes Absolute: 0.8 10*3/uL (ref 0.1–1.0)
Monocytes Relative: 9.4 % (ref 3.0–12.0)
Neutro Abs: 3.3 10*3/uL (ref 1.4–7.7)
Neutrophils Relative %: 40.6 % — ABNORMAL LOW (ref 43.0–77.0)
Platelets: 283 10*3/uL (ref 150.0–400.0)
RBC: 4.93 Mil/uL (ref 3.87–5.11)
RDW: 15.1 % (ref 11.5–15.5)
WBC: 8 10*3/uL (ref 4.0–10.5)

## 2021-11-02 LAB — COMPREHENSIVE METABOLIC PANEL
ALT: 15 U/L (ref 0–35)
AST: 14 U/L (ref 0–37)
Albumin: 4.7 g/dL (ref 3.5–5.2)
Alkaline Phosphatase: 45 U/L (ref 39–117)
BUN: 10 mg/dL (ref 6–23)
CO2: 28 mEq/L (ref 19–32)
Calcium: 9.5 mg/dL (ref 8.4–10.5)
Chloride: 102 mEq/L (ref 96–112)
Creatinine, Ser: 0.85 mg/dL (ref 0.40–1.20)
GFR: 87.4 mL/min (ref >60.00)
Glucose, Bld: 83 mg/dL (ref 70–99)
Potassium: 4.1 mEq/L (ref 3.5–5.1)
Sodium: 138 mEq/L (ref 135–145)
Total Bilirubin: 1 mg/dL (ref 0.2–1.2)
Total Protein: 7.8 g/dL (ref 6.0–8.3)

## 2021-11-02 LAB — URINALYSIS, ROUTINE W REFLEX MICROSCOPIC
Bilirubin Urine: NEGATIVE
Hgb urine dipstick: NEGATIVE
Ketones, ur: NEGATIVE
Leukocytes,Ua: NEGATIVE
Nitrite: NEGATIVE
RBC / HPF: NONE SEEN (ref 0–?)
Specific Gravity, Urine: 1.01 (ref 1.000–1.030)
Total Protein, Urine: NEGATIVE
Urine Glucose: NEGATIVE
Urobilinogen, UA: 0.2 (ref 0.0–1.0)
WBC, UA: NONE SEEN (ref 0–?)
pH: 7 (ref 5.0–8.0)

## 2021-11-02 LAB — VITAMIN D 25 HYDROXY (VIT D DEFICIENCY, FRACTURES): VITD: 32.64 ng/mL (ref 30.00–100.00)

## 2021-11-02 LAB — AMYLASE: Amylase: 70 U/L (ref 27–131)

## 2021-11-02 LAB — B12 AND FOLATE PANEL
Folate: >24.2 ng/mL (ref >5.9)
Vitamin B-12: 330 pg/mL (ref 211–911)

## 2021-11-02 LAB — LIPASE: Lipase: 42 U/L (ref 11.0–59.0)

## 2021-11-02 NOTE — Patient Instructions (Addendum)
Ms. Sawtelle -  ? ?Great to see you ? ?I'll let you know how labs look ? ?One thing you could try is daily antihistamine like cetirizine (available OTC) and see if that helps at all. ? ?If labs are all normal, we can consider further abd imaging like MRI which can provider higher definition. ? ?Thanks, ? ?Rich ? ? ? ?If you have lab work done today you will be contacted with your lab results within the next 2 weeks.  If you have not heard from [place] then please contact us. The fastest way to get your results is to register for My Chart. ? ? ?IF you received an x-ray today, you will receive an invoice from [facility]. Please contact [facility] at [phone] with questions or concerns regarding your invoice.  ? ?IF you received labwork today, you will receive an invoice from [place]. Please contact [facility] at [phone] with questions or concerns regarding your invoice.  ? ?Our billing staff will not be able to assist you with questions regarding bills from these companies. ? ?You will be contacted with the lab results as soon as they are available. The fastest way to get your results is to activate your My Chart account. Instructions are located on the last page of this paperwork. If you have not heard from [place] regarding the results in 2 weeks, please contact this office. ?  ? ? ?

## 2021-11-02 NOTE — Progress Notes (Signed)
? ?Established Patient Office Visit ? ?Subjective:  ?Patient ID: Kimberly Harper, female    DOB: 1985/08/05  Age: 36 y.o. MRN: [id number] ? ?CC:  ?Chief Complaint  ?Patient presents with  ? GI Problem  ?  Patient states she is still having some problems with her stomach. Patient states when she smells food she get nauseous, cramping after eating , and having to make a bowel movement right after eating.  ? ? ?HPI ?Kimberly Harper presents for ongoing GI concerns ? ?She has had nausea, vomiting, diarrhea, abd cramping for years.  ?Has had work up with local GI and GI back in [place] where she is from ?She did have EGD with Dr. Orvan Falconer on 01/04/21 that was unremarkable and negative for H Pylori. ?Did have abd/pel CT w contrast on 07/29/21 that showed constipation, subcentimeter hepatic hypodensity too small to characterize, otherwise no abnormality. ?Most recently seen by Willette Cluster NP with Dr. Orvan Falconer on 08/18/21 ?She was given Levsin SL q8h, carafate 1g po qid, advised to continue omeprazole bid dosing. ? ?Her symptoms include nausea worse with strong smells, abdominal cramping after eating, and bowel urgency soon after eating. These BM are generally firm and without blood or mucus.  ?She has lost some weight over the past month due to poor intake.  ? ?Notes frequently dry skin mostly on hands. No calcium deposits in skin. No raynauds phenomenom ?Does have fam hx of autoimmune processes - SLE in half sister. Does not recall ANA or other autoimmune testing in past.  ? ?She does have follow up with Dr. Orvan Falconer on Nov 30, 2021.  ?Outpatient Medications Prior to Visit  ?Medication Sig Dispense Refill  ? ALPRAZolam (XANAX) 1 MG tablet Take 1 tablet (1 mg total) by mouth 2 (two) times daily as needed. 30 tablet 0  ? hyoscyamine (LEVSIN SL) 0.125 MG SL tablet DISSOLVE 1 TABLET(0.125 MG) UNDER THE TONGUE EVERY 8 HOURS AS NEEDED FOR CRAMPING 30 tablet 0  ? Nitroglycerin 0.4 % OINT Place 1 application. rectally in the  morning and at bedtime. 30 g 0  ? omeprazole (PRILOSEC) 40 MG capsule Take 1 capsule (40 mg total) by mouth 2 (two) times daily. 60 capsule 2  ? ondansetron (ZOFRAN) 4 MG tablet Take 1 tablet (4 mg total) by mouth every 8 (eight) hours as needed for nausea or vomiting. 50 tablet 0  ? pramipexole (MIRAPEX) 0.25 MG tablet Take 1 tablet (0.25 mg total) by mouth 3 (three) times daily. 270 tablet 0  ? propranolol (INDERAL) 20 MG tablet Take 1 tablet (20 mg total) by mouth daily. 30 tablet 0  ? metroNIDAZOLE (FLAGYL) 500 MG tablet Take 1 tablet (500 mg total) by mouth 2 (two) times daily. (Patient not taking: Reported on 11/02/2021) 14 tablet 0  ? sucralfate (CARAFATE) 1 GM/10ML suspension Take 10 mLs (1 g total) by mouth 2 (two) times daily. (Patient not taking: Reported on 11/02/2021) 120 mL 3  ? ?No facility-administered medications prior to visit.  ? ? ?Review of Systems ?Per hpi  ? ?  ?Objective:  ?  ? ?BP 109/78   Pulse 90   Temp 98 ?F (36.7 ?C) (Temporal)   Resp 18   Ht 5\' 4"  (1.626 m)   Wt 121 lb 6.4 oz (55.1 kg)   LMP 10/11/2021   SpO2 100%   BMI 20.84 kg/m?  ? ?Wt Readings from Last 3 Encounters:  ?11/02/21 121 lb 6.4 oz (55.1 kg)  ?08/18/21 130 lb (59 kg)  ?  08/05/21 130 lb (59 kg)  ? ?Physical Exam ?Vitals and nursing note reviewed.  ?Constitutional:   ?   General: She is not in acute distress. ?   Appearance: Normal appearance. She is normal weight. She is not ill-appearing, toxic-appearing or diaphoretic.  ?Cardiovascular:  ?   Rate and Rhythm: Normal rate and regular rhythm.  ?   Heart sounds: Normal heart sounds. No murmur heard. ?  No friction rub. No gallop.  ?Pulmonary:  ?   Effort: Pulmonary effort is normal. No respiratory distress.  ?   Breath sounds: Normal breath sounds. No stridor. No wheezing, rhonchi or rales.  ?Chest:  ?   Chest wall: No tenderness.  ?Skin: ?   General: Skin is warm and dry.  ?Neurological:  ?   General: No focal deficit present.  ?   Mental Status: She is alert and  oriented to person, place, and time. Mental status is at baseline.  ?Psychiatric:     ?   Mood and Affect: Mood normal.     ?   Behavior: Behavior normal.     ?   Thought Content: Thought content normal.     ?   Judgment: Judgment normal.  ? ? ?No results found for any visits on 11/02/21. ? ? ? ?The ASCVD Risk score (Arnett DK, et al., 2019) failed to calculate for the following reasons: ?  The 2019 ASCVD risk score is only valid for ages 61 to 71 ? ?  ?Assessment & Plan:  ? ?Problem List Items Addressed This Visit   ?None ?Visit Diagnoses   ? ? Chronic abdominal pain    -  Primary  ? Relevant Orders  ? CBC with Differential/Platelet  ? Comprehensive metabolic panel  ? B12 and Folate Panel  ? Amylase  ? Lipase  ? ANA,IFA RA Diag Pnl w/rflx Tit/Patn  ? Urinalysis, Routine w reflex microscopic  ? Ambulatory referral to Gastroenterology  ? Nausea without vomiting      ? Relevant Orders  ? CBC with Differential/Platelet  ? Comprehensive metabolic panel  ? B12 and Folate Panel  ? Amylase  ? Lipase  ? ANA,IFA RA Diag Pnl w/rflx Tit/Patn  ? Urinalysis, Routine w reflex microscopic  ? Ambulatory referral to Gastroenterology  ? Abnormal weight loss      ? Relevant Orders  ? CBC with Differential/Platelet  ? Comprehensive metabolic panel  ? B12 and Folate Panel  ? Amylase  ? Lipase  ? ANA,IFA RA Diag Pnl w/rflx Tit/Patn  ? Urinalysis, Routine w reflex microscopic  ? Ambulatory referral to Gastroenterology  ? Vitamin D deficiency      ? Relevant Orders  ? Vitamin D (25 hydroxy)  ? ?  ? ? ?No orders of the defined types were placed in this encounter. ? ? ?Return if symptoms worsen or fail to improve.  ? ?PLAN ?Labs as above. Follow up as indicated. ?Will seek second opinion with other GI per pt request. Referral placed. ?Patient encouraged to call clinic with any questions, comments, or concerns. ? ? ?Janeece Agee, NP ?

## 2021-11-04 LAB — ANA,IFA RA DIAG PNL W/RFLX TIT/PATN
Anti Nuclear Antibody (ANA): NEGATIVE
Cyclic Citrullin Peptide Ab: <16 UNITS
Rheumatoid fact SerPl-aCnc: <14 IU/mL (ref <14)

## 2021-11-13 ENCOUNTER — Ambulatory Visit
Admission: EM | Admit: 2021-11-13 | Discharge: 2021-11-13 | Disposition: A | Discharge status: Home / Self Care | Payer: Medicare Other | Attending: Urgent Care | Admitting: Urgent Care

## 2021-11-13 ENCOUNTER — Encounter: Payer: Self-pay | Admitting: Emergency Medicine

## 2021-11-13 ENCOUNTER — Other Ambulatory Visit: Payer: Self-pay

## 2021-11-13 DIAGNOSIS — N760 Acute vaginitis: Secondary | ICD-10-CM

## 2021-11-13 DIAGNOSIS — N94819 Vulvodynia, unspecified: Secondary | ICD-10-CM | POA: Diagnosis not present

## 2021-11-13 DIAGNOSIS — Z3201 Encounter for pregnancy test, result positive: Secondary | ICD-10-CM

## 2021-11-13 LAB — POCT URINE PREGNANCY: Preg Test, Ur: POSITIVE — AB

## 2021-11-13 NOTE — ED Triage Notes (Signed)
Patient c/o vaginal rash x 4 days, no new detergents, lesions, tears possibly from intercourse.  Vaginal discharge creamy white in color, itchy. ?

## 2021-11-13 NOTE — ED Provider Notes (Signed)
?[name] ? ? ? ?CSN: [id number] ?Arrival date & time: 11/13/21  1124 ? ? ?  ? ?History   ?Chief Complaint ?Chief Complaint  ?Patient presents with  ? Rash  ? ? ?HPI ?Kimberly Harper is a 36 y.o. female.  ? ?Pleasant 36 year old female presents today with a 4 to 5-day history of itching, vaginal discharge, and "paper cut like lesions" to the vagina and anal region.  She states she has a history of recurrent issues, and has very sensitive skin.  She notes that certain soaps and tampons, lotions, or even tissues can cause her to break out in a rash and cause vaginal irritation.  She states she is monogamous with 1 partner.  She was recently treated for both yeast and BV, states she took all of her medications as prescribed and symptoms resolved.  She reports that she was previously actively trying to achieve pregnancy, is requesting a pregnancy test today.  States her menstrual period is supposed to start tomorrow.  States her last sexual encounter was on Tuesday.  Denies pelvic pain, hematuria, dysuria, fever.  She denies anal intercourse. ? ? ?Rash ? ?Past Medical History:  ?Diagnosis Date  ? Alcoholic (HCC)   ? Anemia   ? [employer]rees[employer] 01/17/2020  ? Anxiety   ? Asthma   ? Bipolar 1 disorder (HCC)   ? Blood transfusion without reported diagnosis   ? [employer] 01/17/2020  ? Chronic headaches   ? Depression   ? [employer] 01/17/2020  ? Major depressive disorder   ? Substance abuse (HCC)   ? [employer] 01/17/2020  ? ? ?Patient Active Problem List  ? Diagnosis Date Noted  ? Other allergic rhinitis 06/22/2021  ? Asthma 06/22/2021  ? Heartburn 06/22/2021  ? Panic disorder 04/12/2020  ? GAD (generalized anxiety disorder) 03/18/2020  ? 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 Borderline personality disorder (HCC) 02/06/2020  ? Bipolar 2 disorder, major depressive episode (HCC) 12/06/2019  ? ? ?Past Surgical History:  ?Procedure Laterality Date  ? CESAREAN SECTION N/A   ? [employer] 01/17/2020  ? COLONOSCOPY    ? earlier  in year 2022  ? DILATION AND CURETTAGE OF UTERUS    ? HAND SURGERY Right   ? WISDOM TOOTH EXTRACTION    ? ? ?OB History   ?No obstetric history on file. ?  ? ? ? ?Home Medications   ? ?Prior to Admission medications   ?Medication Sig Start Date End Date Taking? Authorizing Provider  ?ALPRAZolam (XANAX) 1 MG tablet Take 1 tablet (1 mg total) by mouth 2 (two) times daily as needed. 09/21/21  Yes Janeece Agee, NP  ?pramipexole (MIRAPEX) 0.25 MG tablet Take 1 tablet (0.25 mg total) by mouth 3 (three) times daily. 09/21/21  Yes Janeece Agee, NP  ?propranolol (INDERAL) 20 MG tablet Take 1 tablet (20 mg total) by mouth daily. 10/31/21  Yes Janeece Agee, NP  ?montelukast (SINGULAIR) 10 MG tablet Take 1 tablet (10 mg total) by mouth at bedtime. 01/19/20 02/07/20  Janeece Agee, NP  ? ? ?Family History ?Family History  ?Problem Relation Age of Onset  ? Hypertension Mother   ? Diabetes Mother   ? Stroke Mother   ? Depression Mother   ? Depression Sister   ? Irritable bowel syndrome Sister   ? Asthma Brother   ? Allergic rhinitis Brother   ? Migraines Brother   ? Depression Brother   ? Stroke Maternal Grandmother   ? Diabetes Maternal Grandmother   ? Alcohol abuse Maternal  Grandfather   ? Stroke Paternal Grandmother   ? Breast cancer Paternal Grandmother   ? Alcohol abuse Paternal Grandfather   ? Lupus Half-Sister   ? Angioedema Neg Hx   ? Eczema Neg Hx   ? Immunodeficiency Neg Hx   ? Atopy Neg Hx   ? ? ?Social History ?Social History  ? ?Tobacco Use  ? Smoking status: Former  ?  Types: Cigarettes  ?  Quit date: 09/07/2006  ?  Years since quitting: 15.1  ? Smokeless tobacco: Never  ?Vaping Use  ? Vaping Use: Never used  ?Substance Use Topics  ? Alcohol use: Yes  ?  Comment: now soically  ? Drug use: Not Currently  ?  Types: Marijuana  ? ? ? ?Allergies   ?Multiple vitamins-iron ? ? ?Review of Systems ?Review of Systems  ?Skin:  Positive for rash.  ?As per hpi ? ?Physical Exam ?Triage Vital Signs ?ED Triage Vitals  ?Enc Vitals  Group  ?   BP   ?   Pulse   ?   Resp   ?   Temp   ?   Temp src   ?   SpO2   ?   Weight   ?   Height   ?   Head Circumference   ?   Peak Flow   ?   Pain Score   ?   Pain Loc   ?   Pain Edu?   ?   Excl. in GC?   ? ?No data found. ? ?Updated Vital Signs ?BP 106/60 (BP Location: Right Arm)   Pulse 74   Temp 98.5 ?F (36.9 ?C) (Oral)   Resp 18   Ht 5\' 5"  (1.651 m)   Wt 120 lb (54.4 kg)   LMP 10/11/2021   SpO2 98%   BMI 19.97 kg/m?  ? ?Visual Acuity ?Right Eye Distance:   ?Left Eye Distance:   ?Bilateral Distance:   ? ?Right Eye Near:   ?Left Eye Near:    ?Bilateral Near:    ? ?Physical Exam ?Vitals and nursing note reviewed. Chaperone present: deferred.  ?Constitutional:   ?   Appearance: She is well-developed and normal weight.  ?HENT:  ?   Head: Normocephalic.  ?Cardiovascular:  ?   Rate and Rhythm: Normal rate.  ?Pulmonary:  ?   Effort: Pulmonary effort is normal. No respiratory distress.  ?Abdominal:  ?   General: Abdomen is flat. Bowel sounds are normal. There is no distension. There are no signs of injury.  ?   Palpations: Abdomen is soft. There is no shifting dullness, fluid wave, hepatomegaly, splenomegaly, mass or pulsatile mass.  ?   Tenderness: There is no abdominal tenderness. There is no right CVA tenderness, left CVA tenderness, guarding or rebound. Negative signs include Murphy's sign, Rovsing's sign, McBurney's sign, psoas sign and obturator sign.  ?   Hernia: No hernia is present.  ?Genitourinary: ?   General: Normal vulva.  ?   Pubic Area: No rash or pubic lice.   ?   Labia:     ?   Right: No rash, tenderness, lesion or injury.     ?   Left: No rash, tenderness, lesion or injury.   ?   Urethra: No prolapse, urethral pain, urethral swelling or urethral lesion.  ?   Vagina: No signs of injury and foreign body. Vaginal discharge (thick, yellow) present. No erythema, tenderness, bleeding, lesions or prolapsed vaginal walls.  ?   Cervix: No cervical  motion tenderness, discharge, friability, lesion,  erythema, cervical bleeding or eversion.  ?   Uterus: Normal. Not deviated, not enlarged, not fixed, not tender and no uterine prolapse.   ?   Adnexa: Right adnexa normal and left adnexa normal.    ?   Right: No mass, tenderness or fullness.      ?   Left: No mass, tenderness or fullness.    ?   Rectum: Normal.  ? ? ?   Comments: Above shows areas of fissures "paper cuts" per pt ?Neurological:  ?   Mental Status: She is alert.  ? ? ? ?UC Treatments / Results  ?Labs ?(all labs ordered are listed, but only abnormal results are displayed) ?Labs Reviewed  ?POCT URINE PREGNANCY - Abnormal; Notable for the following components:  ?    Result Value  ? Preg Test, Ur Positive (*)   ? All other components within normal limits  ?HSV CULTURE AND TYPING  ?CERVICOVAGINAL ANCILLARY ONLY  ? ? ?EKG ? ? ?Radiology ?No results found. ? ?Procedures ?Procedures (including critical care time) ? ?Medications Ordered in UC ?Medications - No data to display ? ?Initial Impression / Assessment and Plan / UC Course  ?I have reviewed the triage vital signs and the nursing notes. ? ?Pertinent labs & imaging results that were available during my care of the patient were reviewed by me and considered in my medical decision making (see chart for details). ? ?  ? ?Vaginitis -patient does have vaginal discharge, given positive pregnancy test, uncertain if this is physiologic secondary to hormonal changes.  Patient was recently treated for BV and yeast.  Will await results of Aptima swab. ?Vulvodynia -the small fissures were swabbed to assess for HSV.  Recommended patient use topical Vaseline. ?Positive pregnancy test -discussed with patient.  Will need to establish with OB. Start OTC prenatal vitamins. Call PCP to discuss home medications, must avoid benzos while pregnant. ? ?Final Clinical Impressions(s) / UC Diagnoses  ? ?Final diagnoses:  ?Vaginitis and vulvovaginitis  ?Vulvodynia  ?Positive pregnancy test  ? ? ? ?Discharge Instructions   ? ?   ?Your pregnancy test is positive. ?Please start taking prenatal vitamin immediately. ?You must avoid taking alprazolam or Xanax while pregnant.  This is contraindicated. ?Please discuss your Mirapex and Inderal wit

## 2021-11-13 NOTE — Discharge Instructions (Addendum)
Your pregnancy test is positive. ?Please start taking prenatal vitamin immediately. ?You must avoid taking alprazolam or Xanax while pregnant.  This is contraindicated. ?Please discuss your Mirapex and Inderal with your PCP. ?You must establish care with an OB. ?We will call with the results of your Aptima swab and HSV swab once results received.  We will initiate treatment (if indicated) based on results, and choose an option that is safe for pregnancy. ?

## 2021-11-14 LAB — CERVICOVAGINAL ANCILLARY ONLY
Bacterial Vaginitis (gardnerella): POSITIVE — AB
Candida Glabrata: NEGATIVE
Candida Vaginitis: POSITIVE — AB
Chlamydia: NEGATIVE
Comment: NEGATIVE
Comment: NEGATIVE
Comment: NEGATIVE
Comment: NEGATIVE
Comment: NEGATIVE
Comment: NORMAL
Neisseria Gonorrhea: NEGATIVE
Trichomonas: NEGATIVE

## 2021-11-15 ENCOUNTER — Telehealth (HOSPITAL_COMMUNITY): Payer: Self-pay | Admitting: Emergency Medicine

## 2021-11-15 MED ORDER — METRONIDAZOLE 0.75 % VA GEL: 1.0000 | Freq: Every day | VAGINAL | 0 refills | Status: AC

## 2021-11-15 MED ORDER — CLOTRIMAZOLE 1 % VA CREA
1.0000 | TOPICAL_CREAM | Freq: Every day | VAGINAL | 0 refills | Status: DC
Start: 2021-11-15 — End: 2021-11-30

## 2021-11-16 ENCOUNTER — Encounter: Payer: Self-pay | Admitting: Registered Nurse

## 2021-11-16 ENCOUNTER — Ambulatory Visit (INDEPENDENT_AMBULATORY_CARE_PROVIDER_SITE_OTHER): Payer: Medicare Other | Admitting: Registered Nurse

## 2021-11-16 ENCOUNTER — Other Ambulatory Visit: Payer: Self-pay

## 2021-11-16 ENCOUNTER — Ambulatory Visit: Payer: Medicare Other | Admitting: Neurology

## 2021-11-16 VITALS — BP 118/64 | HR 60 | Temp 98.2°F | Resp 18 | Ht 65.0 in | Wt 121.2 lb

## 2021-11-16 DIAGNOSIS — N926 Irregular menstruation, unspecified: Secondary | ICD-10-CM | POA: Diagnosis not present

## 2021-11-16 DIAGNOSIS — F411 Generalized anxiety disorder: Secondary | ICD-10-CM | POA: Diagnosis not present

## 2021-11-16 LAB — POCT URINE PREGNANCY: Preg Test, Ur: POSITIVE — AB

## 2021-11-16 LAB — HSV CULTURE AND TYPING

## 2021-11-16 MED ORDER — SERTRALINE HCL 100 MG PO TABS: ORAL_TABLET | ORAL | 0 refills | Status: DC

## 2021-11-16 NOTE — Patient Instructions (Addendum)
Kimberly Harper -  ? ?Great to see you ? ?These conversations are tough sometimes, but like I said, based on your last period, you are under no obligation to make a decision imminently.  ? ?Further discussions on termination can happen at [facility], [facility], or [facility] ? ?[facility] ?[address] ?[phone] ? ?[facility] ?[address] ?[phone] ?[facility] [place] ?[address] ?[phone] ? ?[facility] [place] ?[address] ? [phone] ? ? ?For further info on maintenance, let me know and I can get you set up with an obstetrician as soon as possible. If you do decide to maintain, start a prenatal vitamin. ? ?Continue to hold alprazolam and Mirapex. Start sertraline 50mg  daily. Increase to 100mg  daily after two weeks. Let's touch base in the next 6-8 weeks ? ?Thanks, ? ?[name]  ?If you have lab work done today you will be contacted with your lab results within the next 2 weeks.  If you have not heard from then please contact . The fastest way to get your results is to register for My Chart. ? ? ?IF you received an x-ray today, you will receive an invoice from [facility]. Please contact [facility] at [phone] with questions or concerns regarding your invoice.  ? ?IF you received labwork today, you will receive an invoice from [facility]. Please contact [facility] at [phone] with questions or concerns regarding your invoice.  ? ?Our billing staff will not be able to assist you with questions regarding bills from these companies. ? ?You will be contacted with the lab results as soon as they are available. The fastest way to get your results is to activate your My Chart account. Instructions are located on the last page of this paperwork. If you have not heard from [facility] regarding the results in 2 weeks, please contact this office. ?  ? ? ?

## 2021-11-16 NOTE — Telephone Encounter (Signed)
Issues was resolved. ?

## 2021-11-16 NOTE — Progress Notes (Signed)
? ?Established Patient Office Visit ? ?Subjective:  ?Patient ID: Kimberly Harper, female    DOB: [birth year]  Age: 36 y.o. MRN: [id number] ? ?CC:  ?Chief Complaint  ?Patient presents with  ? Possible Pregnancy  ?  Patient states she wants to discuss possible pregnancy.  ? ? ?HPI ?Kimberly Harper presents for pregnancy test ? ?LMP: 10/11/21 ? ?Does not desire pregnancy. She is concerned about her age, her mental health, and an undetermined GI issue that we have yet to be able to diagnose ?She is concerned about pressure she'd face from her partner, but she does not feel like she is at risk for harm ? ?She has had multiple miscarriages in the past. She did have retained contents of uterus leading to chronic uterine bleeding and severe anemia.  ? ?Outpatient Medications Prior to Visit  ?Medication Sig Dispense Refill  ? ALPRAZolam (XANAX) 1 MG tablet Take 1 tablet (1 mg total) by mouth 2 (two) times daily as needed. 30 tablet 0  ? clotrimazole (GYNE-LOTRIMIN) 1 % vaginal cream Place 1 Applicatorful vaginally at bedtime. 45 g 0  ? metroNIDAZOLE (METROGEL VAGINAL) 0.75 % vaginal gel Place 1 Applicatorful vaginally at bedtime for 5 days. 50 g 0  ? pramipexole (MIRAPEX) 0.25 MG tablet Take 1 tablet (0.25 mg total) by mouth 3 (three) times daily. 270 tablet 0  ? propranolol (INDERAL) 20 MG tablet Take 1 tablet (20 mg total) by mouth daily. 30 tablet 0  ? ?No facility-administered medications prior to visit.  ? ? ?Review of Systems  ?Constitutional: Negative.   ?HENT: Negative.    ?Eyes: Negative.   ?Respiratory: Negative.    ?Cardiovascular: Negative.   ?Gastrointestinal: Negative.   ?Genitourinary: Negative.   ?Musculoskeletal: Negative.   ?Skin: Negative.   ?Neurological: Negative.   ?Psychiatric/Behavioral: Negative.    ?All other systems reviewed and are negative. ? ?  ?Objective:  ?  ? ?BP 118/64   Pulse 60   Temp 98.2 ?F (36.8 ?C) (Temporal)   Resp 18   Ht 5\' 5"  (1.651 m)   Wt 121 lb 3.2 oz (55 kg)    LMP 10/11/2021   BMI 20.17 kg/m?  ? ?Wt Readings from Last 3 Encounters:  ?11/16/21 121 lb 3.2 oz (55 kg)  ?11/13/21 120 lb (54.4 kg)  ?11/02/21 121 lb 6.4 oz (55.1 kg)  ? ?Physical Exam ?Vitals and nursing note reviewed.  ?Constitutional:   ?   General: She is not in acute distress. ?   Appearance: Normal appearance. She is normal weight. She is not ill-appearing, toxic-appearing or diaphoretic.  ?Cardiovascular:  ?   Rate and Rhythm: Normal rate and regular rhythm.  ?   Heart sounds: Normal heart sounds. No murmur heard. ?  No friction rub. No gallop.  ?Pulmonary:  ?   Effort: Pulmonary effort is normal. No respiratory distress.  ?   Breath sounds: Normal breath sounds. No stridor. No wheezing, rhonchi or rales.  ?Chest:  ?   Chest wall: No tenderness.  ?Skin: ?   General: Skin is warm and dry.  ?Neurological:  ?   General: No focal deficit present.  ?   Mental Status: She is alert and oriented to person, place, and time. Mental status is at baseline.  ?Psychiatric:     ?   Mood and Affect: Mood normal.     ?   Behavior: Behavior normal.     ?   Thought Content: Thought content normal.     ?  Judgment: Judgment normal.  ? ? ?Results for orders placed or performed in visit on 11/16/21  ?POCT urine pregnancy  ?Result Value Ref Range  ? Preg Test, Ur Positive (A) Negative  ? ? ? ? ?The ASCVD Risk score (Arnett DK, et al., 2019) failed to calculate for the following reasons: ?  The 2019 ASCVD risk score is only valid for ages 67 to 31 ? ?  ?Assessment & Plan:  ? ?Problem List Items Addressed This Visit   ? ?  ? Other  ? GAD (generalized anxiety disorder)  ? Relevant Medications  ? sertraline (ZOLOFT) 100 MG tablet  ? ?Other Visit Diagnoses   ? ? Missed menses    -  Primary  ? Relevant Orders  ? POCT urine pregnancy (Completed)  ? ?  ? ? ?Meds ordered this encounter  ?Medications  ? sertraline (ZOLOFT) 100 MG tablet  ?  Sig: Take 0.5 tablets (50 mg total) by mouth daily for 14 days, THEN 1 tablet (100 mg total)  daily.  ?  Dispense:  83 tablet  ?  Refill:  0  ?  Order Specific Question:   Supervising Provider  ?  Answer:   Carlota Raspberry, JEFFREY R [[id number]]  ? ? ?Return in about 6 weeks (around 12/28/2021) for med check.  ? ?PLAN ?Provided resources for termination and maintenance of pregnancy. Can refer to OBGYN if pt desires to maintain ?She will stop Mirapex and alprazolam. We will start sertraline 50mg  po qd x two weeks then increase to 100mg  for another four weeks+, med check in 6-8 weeks. ?Reviewed risks, benefits, and side effects, pt voices understanding. ?Patient encouraged to call clinic with any questions, comments, or concerns. ? ? ?Maximiano Coss, NP ?

## 2021-11-17 ENCOUNTER — Emergency Department (HOSPITAL_COMMUNITY)
Admission: EM | Admit: 2021-11-17 | Discharge: 2021-11-17 | Disposition: A | Discharge status: Home / Self Care | Payer: Medicare Other | Attending: Emergency Medicine | Admitting: Emergency Medicine

## 2021-11-17 ENCOUNTER — Other Ambulatory Visit: Payer: Self-pay | Admitting: Registered Nurse

## 2021-11-17 ENCOUNTER — Other Ambulatory Visit: Payer: Self-pay

## 2021-11-17 ENCOUNTER — Emergency Department (HOSPITAL_COMMUNITY): Payer: Medicare Other

## 2021-11-17 ENCOUNTER — Encounter (HOSPITAL_COMMUNITY): Payer: Self-pay | Admitting: Emergency Medicine

## 2021-11-17 DIAGNOSIS — O09521 Supervision of elderly multigravida, first trimester: Secondary | ICD-10-CM | POA: Diagnosis not present

## 2021-11-17 DIAGNOSIS — O3481 Maternal care for other abnormalities of pelvic organs, first trimester: Secondary | ICD-10-CM | POA: Diagnosis not present

## 2021-11-17 DIAGNOSIS — Z3A01 Less than 8 weeks gestation of pregnancy: Secondary | ICD-10-CM | POA: Insufficient documentation

## 2021-11-17 DIAGNOSIS — O209 Hemorrhage in early pregnancy, unspecified: Secondary | ICD-10-CM | POA: Insufficient documentation

## 2021-11-17 DIAGNOSIS — O09291 Supervision of pregnancy with other poor reproductive or obstetric history, first trimester: Secondary | ICD-10-CM | POA: Insufficient documentation

## 2021-11-17 DIAGNOSIS — O3680X0 Pregnancy with inconclusive fetal viability, not applicable or unspecified: Secondary | ICD-10-CM | POA: Diagnosis not present

## 2021-11-17 DIAGNOSIS — N83201 Unspecified ovarian cyst, right side: Secondary | ICD-10-CM | POA: Insufficient documentation

## 2021-11-17 DIAGNOSIS — F411 Generalized anxiety disorder: Secondary | ICD-10-CM

## 2021-11-17 DIAGNOSIS — O0281 Inappropriate change in quantitative human chorionic gonadotropin (hCG) in early pregnancy: Secondary | ICD-10-CM | POA: Insufficient documentation

## 2021-11-17 DIAGNOSIS — O4690 Antepartum hemorrhage, unspecified, unspecified trimester: Secondary | ICD-10-CM

## 2021-11-17 LAB — URINALYSIS, ROUTINE W REFLEX MICROSCOPIC
Bilirubin Urine: NEGATIVE
Glucose, UA: NEGATIVE mg/dL
Hgb urine dipstick: NEGATIVE
Ketones, ur: NEGATIVE mg/dL
Leukocytes,Ua: NEGATIVE
Nitrite: NEGATIVE
Protein, ur: NEGATIVE mg/dL
Specific Gravity, Urine: 1.001 — ABNORMAL LOW (ref 1.005–1.030)
pH: 8 (ref 5.0–8.0)

## 2021-11-17 LAB — CBC WITH DIFFERENTIAL/PLATELET
Abs Immature Granulocytes: 0.03 10*3/uL (ref 0.00–0.07)
Basophils Absolute: 0.1 10*3/uL (ref 0.0–0.1)
Basophils Relative: 1 %
Eosinophils Absolute: 0.2 10*3/uL (ref 0.0–0.5)
Eosinophils Relative: 2 %
HCT: 35.8 % — ABNORMAL LOW (ref 36.0–46.0)
Hemoglobin: 11.6 g/dL — ABNORMAL LOW (ref 12.0–15.0)
Immature Granulocytes: 0 %
Lymphocytes Relative: 37 %
Lymphs Abs: 4 10*3/uL (ref 0.7–4.0)
MCH: 26.7 pg (ref 26.0–34.0)
MCHC: 32.4 g/dL (ref 30.0–36.0)
MCV: 82.5 fL (ref 80.0–100.0)
Monocytes Absolute: 1.2 10*3/uL — ABNORMAL HIGH (ref 0.1–1.0)
Monocytes Relative: 11 %
Neutro Abs: 5.5 10*3/uL (ref 1.7–7.7)
Neutrophils Relative %: 49 %
Platelets: 305 10*3/uL (ref 150–400)
RBC: 4.34 MIL/uL (ref 3.87–5.11)
RDW: 14.7 % (ref 11.5–15.5)
WBC: 10.9 10*3/uL — ABNORMAL HIGH (ref 4.0–10.5)
nRBC: 0 % (ref 0.0–0.2)

## 2021-11-17 LAB — WET PREP, GENITAL
Clue Cells Wet Prep HPF POC: NONE SEEN
Sperm: NONE SEEN
Trich, Wet Prep: NONE SEEN
WBC, Wet Prep HPF POC: <10 (ref <10)
Yeast Wet Prep HPF POC: NONE SEEN

## 2021-11-17 LAB — COMPREHENSIVE METABOLIC PANEL
ALT: 13 U/L (ref 0–44)
AST: 15 U/L (ref 15–41)
Albumin: 4.3 g/dL (ref 3.5–5.0)
Alkaline Phosphatase: 39 U/L (ref 38–126)
Anion gap: 7 (ref 5–15)
BUN: 8 mg/dL (ref 6–20)
CO2: 26 mmol/L (ref 22–32)
Calcium: 9.3 mg/dL (ref 8.9–10.3)
Chloride: 105 mmol/L (ref 98–111)
Creatinine, Ser: 0.71 mg/dL (ref 0.44–1.00)
GFR, Estimated: >60 mL/min (ref >60)
Glucose, Bld: 101 mg/dL — ABNORMAL HIGH (ref 70–99)
Potassium: 3.6 mmol/L (ref 3.5–5.1)
Sodium: 138 mmol/L (ref 135–145)
Total Bilirubin: 1.1 mg/dL (ref 0.3–1.2)
Total Protein: 8 g/dL (ref 6.5–8.1)

## 2021-11-17 LAB — ABO/RH: ABO/RH(D): O POS

## 2021-11-17 LAB — I-STAT BETA HCG BLOOD, ED (MC, WL, AP ONLY): I-stat hCG, quantitative: 556.3 m[IU]/mL — ABNORMAL HIGH (ref <5)

## 2021-11-17 MED ORDER — ACETAMINOPHEN 500 MG PO TABS
1000.0000 mg | ORAL_TABLET | Freq: Once | ORAL | Status: AC
  Administered 2021-11-17: 500 mg via ORAL
  Filled 2021-11-17: qty 2

## 2021-11-17 MED ORDER — HYDROXYZINE HCL 10 MG PO TABS: 10.0000 mg | ORAL_TABLET | Freq: Three times a day (TID) | ORAL | 0 refills | Status: DC | PRN

## 2021-11-17 NOTE — ED Provider Notes (Signed)
?[facility]-EMERGENCY DEPT ?Provider Note ? ? ?CSN: [id number] ?Arrival date & time: 11/17/21  1740 ? ?  ? ?History ? ?Chief Complaint  ?Patient presents with  ? Anxiety  ? Vaginal Bleeding  ? ? ?Kimberly Estelle GrumblesJenelle Harper is a 36 y.o. female. ? ?The history is provided by the patient and medical records. No language interpreter was used.  ?Anxiety ? ?Vaginal Bleeding ? ?This is a 36 year old female [id number] who is approximately [redacted] weeks pregnant presenting with multiple complaints.  Patient mention since finding out that she is pregnant she has been avoiding using her Xanax for anxiety.  Because of it and she is having more bouts of anxiety within the past several weeks.  Felt shaky and anxious.  She was seen at an urgent care center 4 days ago and had a pelvic exam and was diagnosed with yeast and BV.  She was given medications including suppositories and cream and when inserting the suppository she noted some trace of blood which concerns her.  He also endorsed some abdominal cramping, feeling hungry but afraid to eat, and overall not feeling well.  She has not had a formal ultrasound.  Her abdominal cramping is minimal.  Her bleeding is minimal ? ?Home Medications ?Prior to Admission medications   ?Medication Sig Start Date End Date Taking? Authorizing Provider  ?ALPRAZolam (XANAX) 1 MG tablet Take 1 tablet (1 mg total) by mouth 2 (two) times daily as needed. 09/21/21   Janeece AgeeMorrow, Richard, NP  ?clotrimazole (GYNE-LOTRIMIN) 1 % vaginal cream Place 1 Applicatorful vaginally at bedtime. 11/15/21   Lamptey, Britta MccreedyPhilip O, MD  ?hydrOXYzine (ATARAX) 10 MG tablet Take 1 tablet (10 mg total) by mouth 3 (three) times daily as needed (Use in place of alprazolam). 11/17/21   Janeece AgeeMorrow, Richard, NP  ?metroNIDAZOLE (METROGEL VAGINAL) 0.75 % vaginal gel Place 1 Applicatorful vaginally at bedtime for 5 days. 11/15/21 11/20/21  Merrilee JanskyLamptey, Philip O, MD  ?pramipexole (MIRAPEX) 0.25 MG tablet Take 1 tablet (0.25 mg total) by mouth 3  (three) times daily. 09/21/21   Janeece AgeeMorrow, Richard, NP  ?propranolol (INDERAL) 20 MG tablet Take 1 tablet (20 mg total) by mouth daily. 10/31/21   Janeece AgeeMorrow, Richard, NP  ?sertraline (ZOLOFT) 100 MG tablet Take 0.5 tablets (50 mg total) by mouth daily for 14 days, THEN 1 tablet (100 mg total) daily. 11/16/21 02/14/22  Janeece AgeeMorrow, Richard, NP  ?montelukast (SINGULAIR) 10 MG tablet Take 1 tablet (10 mg total) by mouth at bedtime. 01/19/20 02/07/20  Janeece AgeeMorrow, Richard, NP  ?   ? ?Allergies    ?Multiple vitamins-iron   ? ?Review of Systems   ?Review of Systems  ?Genitourinary:  Positive for vaginal bleeding.  ?All other systems reviewed and are negative. ? ?Physical Exam ?Updated Vital Signs ?BP 126/83 (BP Location: Left Arm)   Pulse 78   Temp 98.3 ?F (36.8 ?C) (Oral)   Resp 17   LMP 10/11/2021   SpO2 100%  ?Physical Exam ?Vitals and nursing note reviewed.  ?Constitutional:   ?   General: She is not in acute distress. ?   Appearance: She is well-developed.  ?HENT:  ?   Head: Atraumatic.  ?Eyes:  ?   Conjunctiva/sclera: Conjunctivae normal.  ?Pulmonary:  ?   Effort: Pulmonary effort is normal.  ?Abdominal:  ?   Palpations: Abdomen is soft.  ?   Tenderness: There is no abdominal tenderness.  ?Genitourinary: ?   Comments: Please refer to procedure note  ?Musculoskeletal:  ?   Cervical back: Neck supple.  ?  Skin: ?   Findings: No rash.  ?Neurological:  ?   Mental Status: She is alert.  ?Psychiatric:     ?   Mood and Affect: Mood normal.  ? ? ?ED Results / Procedures / Treatments   ?Labs ?(all labs ordered are listed, but only abnormal results are displayed) ?Labs Reviewed  ?CBC WITH DIFFERENTIAL/PLATELET - Abnormal; Notable for the following components:  ?    Result Value  ? WBC 10.9 (*)   ? Hemoglobin 11.6 (*)   ? HCT 35.8 (*)   ? Monocytes Absolute 1.2 (*)   ? All other components within normal limits  ?COMPREHENSIVE METABOLIC PANEL - Abnormal; Notable for the following components:  ? Glucose, Bld 101 (*)   ? All other components within  normal limits  ?URINALYSIS, ROUTINE W REFLEX MICROSCOPIC - Abnormal; Notable for the following components:  ? Color, Urine COLORLESS (*)   ? Specific Gravity, Urine 1.001 (*)   ? All other components within normal limits  ?I-STAT BETA HCG BLOOD, ED ([facility], [facility], [facility] ONLY) - Abnormal; Notable for the following components:  ? I-stat hCG, quantitative 556.3 (*)   ? All other components within normal limits  ?WET PREP, GENITAL  ?RPR  ?I-STAT BETA HCG BLOOD, ED ([facility], [facility], [facility] ONLY)  ?ABO/RH  ?GC/CHLAMYDIA PROBE AMP ([place]) NOT AT [facility]  ? ? ?EKG ?None ? ?Radiology ?US OB Comp < 14 Wks ? ?Result Date: 11/17/2021 ?CLINICAL DATA:  Pregnant patient with vaginal bleeding and right adnexal tenderness. Gestational age based on LMP 5 weeks 2 days. Beta HCG 556. EXAM: OBSTETRIC <14 WK [place] AND TRANSVAGINAL OB US TECHNIQUE: Both transabdominal and transvaginal ultrasound examinations were performed for complete evaluation of the gestation as well as the maternal uterus, adnexal regions, and pelvic cul-de-sac. Transvaginal technique was performed to assess early pregnancy. COMPARISON:  None. FINDINGS: Intrauterine gestational sac: No intrauterine gestational sac. There is a small amount of ill-defined fluid in the endometrial canal. Yolk sac:  Not Visualized. Embryo:  Not Visualized. Cardiac Activity: Not Visualized. Maternal uterus/adnexae: There is no intrauterine gestational sac. Small amount of non organized fluid is seen in the endometrial canal. Endometrium measures 14 mm. There is a 3 mm echogenic focus within the uterine myometrium, likely a calcification within the right ovary is a complex cyst measuring 3.6 x 2.3 x 2.8 cm, peripheral low-level echoes. There is peripheral blood flow, as well as parenchymal blood flow. The left ovary is visualized and normal. There is no adnexal mass or pelvic free fluid. IMPRESSION: 1. No intrauterine pregnancy. Small amount of ill-defined fluid in the endometrial canal but no definite  gestational sac. 2. Complex cyst in the right ovary is presumably a hemorrhagic or corpus luteal cyst. No adnexal mass or pelvic free fluid to suggest ectopic. 3. Findings are consistent with pregnancy of unknown location and may reflect early intrauterine pregnancy not yet visualized sonographically, occult ectopic pregnancy, or failed pregnancy. Recommend trending of beta HCG and sonographic follow-up in 7-10 days, at which time the right ovarian cyst can be reassessed. Electronically Signed   By: Narda Rutherford M.D.   On: 11/17/2021 20:54  ? ?US OB Transvaginal ? ?Result Date: 11/17/2021 ?CLINICAL DATA:  Pregnant patient with vaginal bleeding and right adnexal tenderness. Gestational age based on LMP 5 weeks 2 days. Beta HCG 556. EXAM: OBSTETRIC <14 WK [place] AND TRANSVAGINAL OB US TECHNIQUE: Both transabdominal and transvaginal ultrasound examinations were performed for complete evaluation of the gestation as well as the  maternal uterus, adnexal regions, and pelvic cul-de-sac. Transvaginal technique was performed to assess early pregnancy. COMPARISON:  None. FINDINGS: Intrauterine gestational sac: No intrauterine gestational sac. There is a small amount of ill-defined fluid in the endometrial canal. Yolk sac:  Not Visualized. Embryo:  Not Visualized. Cardiac Activity: Not Visualized. Maternal uterus/adnexae: There is no intrauterine gestational sac. Small amount of non organized fluid is seen in the endometrial canal. Endometrium measures 14 mm. There is a 3 mm echogenic focus within the uterine myometrium, likely a calcification within the right ovary is a complex cyst measuring 3.6 x 2.3 x 2.8 cm, peripheral low-level echoes. There is peripheral blood flow, as well as parenchymal blood flow. The left ovary is visualized and normal. There is no adnexal mass or pelvic free fluid. IMPRESSION: 1. No intrauterine pregnancy. Small amount of ill-defined fluid in the endometrial canal but no definite gestational sac. 2.  Complex cyst in the right ovary is presumably a hemorrhagic or corpus luteal cyst. No adnexal mass or pelvic free fluid to suggest ectopic. 3. Findings are consistent with pregnancy of unknown location and may reflect ea

## 2021-11-17 NOTE — ED Triage Notes (Signed)
Pt reports being [redacted] weeks pregnant and noticed some blood when she wipes. Pt also has not had anxiety meds since last Wednesday so she has been having anxiety attacks. Pt also c/o abd discomfort  ?

## 2021-11-17 NOTE — ED Notes (Signed)
Urine specimen obtained, placed in triage urine collection bin, holding for order.  

## 2021-11-17 NOTE — Discharge Instructions (Signed)
You have been evaluated for your symptoms.  An ultrasound was performed but no obvious findings were noted as it may be too early for ultrasound to detect any pregnancy.  Please follow-up with an OB/GYN in the next 7 to 10 days to have a repeat ultrasound and repeat quantitative beta-hCG for further assessment. ?

## 2021-11-18 LAB — RPR: RPR Ser Ql: NONREACTIVE

## 2021-11-18 LAB — GC/CHLAMYDIA PROBE AMP (~~LOC~~) NOT AT ARMC
Chlamydia: NEGATIVE
Comment: NEGATIVE
Comment: NORMAL
Neisseria Gonorrhea: NEGATIVE

## 2021-11-21 DIAGNOSIS — O3680X0 Pregnancy with inconclusive fetal viability, not applicable or unspecified: Secondary | ICD-10-CM

## 2021-11-21 HISTORY — DX: Pregnancy with inconclusive fetal viability, not applicable or unspecified: O36.80X0

## 2021-11-24 ENCOUNTER — Telehealth: Payer: Medicare Other

## 2021-11-24 ENCOUNTER — Telehealth (INDEPENDENT_AMBULATORY_CARE_PROVIDER_SITE_OTHER): Payer: Medicare Other | Admitting: Registered Nurse

## 2021-11-24 ENCOUNTER — Encounter: Payer: Self-pay | Admitting: Registered Nurse

## 2021-11-24 DIAGNOSIS — O4690 Antepartum hemorrhage, unspecified, unspecified trimester: Secondary | ICD-10-CM

## 2021-11-24 DIAGNOSIS — R102 Pelvic and perineal pain: Secondary | ICD-10-CM

## 2021-11-24 NOTE — Telephone Encounter (Signed)
Patient called in stating that she believes she has had a a miscarriage. Kimberly Harper says she isnt having a lot of bleeding but feels like something if off. Scheduled an appointment for Monday, didn't know if this can be discussed her or if she would benefit from going to see an OBGYN.  ?

## 2021-11-24 NOTE — Progress Notes (Signed)
? ? ?Telemedicine Encounter- SOAP NOTE Established Patient ? ?This telephone encounter was conducted with the patient's (or proxy's) verbal consent via audio telecommunications: yes/no: Yes ?Patient was instructed to have this encounter in a suitably private space; and to only have persons present to whom they give permission to participate. In addition, patient identity was confirmed by use of name plus two identifiers (DOB and address).  I discussed the limitations, risks, security and privacy concerns of performing an evaluation and management service by telephone and the availability of in person appointments. I also discussed with the patient that there may be a patient responsible charge related to this service. The patient expressed understanding and agreed to proceed. ? ?I spent a total of TIME; 0 MIN TO 60 MIN: 15 minutes talking with the patient or their proxy. ? ?Patient at home ?Provider in office ? ?Participants: Jari Sportsman, NP and [name] ? ?No chief complaint on file. ? ? ?Subjective  ? ?Kimberly Harper is a 36 y.o. established patient. Telephone visit today for vaginal bleeding ? ?HPI ?Seen in office by me on 11/16/21 with missed menses, positive pregnancy test with EGA around 4-6 weeks based on LMP. Given information on maintaining and terminating and pt yet to have made decision. ? ?She noted on 11/21/21 that she had started bleeding. Some clots and midline pelvic pain. ?Went to ED, beta quant of 522, transabdominal US noted intrauterine pregnancy unable to be noted on transvaginal US.  ? ?She was advised to follow up in 48-72 hours for repeat beta quant ? ?Unfortunately her car has broken down. Interested in determining next steps. ? ?She feels much better. Some darkish brown bleeding/spotting but fairly light. Less cramping. Feels stronger. No worsening symptoms. No fevers, chills, nvd.  ? ?Patient Active Problem List  ? Diagnosis Date Noted  ? Other allergic rhinitis  06/22/2021  ? Asthma 06/22/2021  ? Heartburn 06/22/2021  ? Panic disorder 04/12/2020  ? GAD (generalized anxiety disorder) 03/18/2020  ? 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 Borderline personality disorder (HCC) 02/06/2020  ? Bipolar 2 disorder, major depressive episode (HCC) 12/06/2019  ? ? ?Past Medical History:  ?Diagnosis Date  ? Alcoholic (HCC)   ? Anemia   ? [facility] 01/17/2020  ? Anxiety   ? Asthma   ? Bipolar 1 disorder (HCC)   ? Blood transfusion without reported diagnosis   ? [facility] 01/17/2020  ? Chronic headaches   ? Depression   ? [facility] 01/17/2020  ? Major depressive disorder   ? Substance abuse (HCC)   ? [facility] 01/17/2020  ? ? ?Current Outpatient Medications  ?Medication Sig Dispense Refill  ? ALPRAZolam (XANAX) 1 MG tablet Take 1 tablet (1 mg total) by mouth 2 (two) times daily as needed. 30 tablet 0  ? clotrimazole (GYNE-LOTRIMIN) 1 % vaginal cream Place 1 Applicatorful vaginally at bedtime. 45 g 0  ? hydrOXYzine (ATARAX) 10 MG tablet Take 1 tablet (10 mg total) by mouth 3 (three) times daily as needed (Use in place of alprazolam). 30 tablet 0  ? propranolol (INDERAL) 20 MG tablet Take 1 tablet (20 mg total) by mouth daily. 30 tablet 0  ? pramipexole (MIRAPEX) 0.25 MG tablet Take 1 tablet (0.25 mg total) by mouth 3 (three) times daily. (Patient not taking: Reported on 11/24/2021) 270 tablet 0  ? sertraline (ZOLOFT) 100 MG tablet Take 0.5 tablets (50 mg total) by mouth daily for 14 days, THEN 1 tablet (100 mg total) daily. (Patient not taking: Reported on  11/24/2021) 83 tablet 0  ? ?No current facility-administered medications for this visit.  ? ? ?Allergies  ?Allergen Reactions  ? Multiple Vitamins-Iron Swelling  ?  States that vitamins, folic acid and iron have caused swelling of tongue and gums  ? ? ?Social History  ? ?Socioeconomic History  ? Marital status: Single  ?  Spouse name: Not on file  ? Number of children: 1  ? Years of education: Not on file  ? Highest education level: Not on file   ?Occupational History  ? Occupation: Academic librarian  ?Tobacco Use  ? Smoking status: Former  ?  Types: Cigarettes  ?  Quit date: 09/07/2006  ?  Years since quitting: 15.2  ? Smokeless tobacco: Never  ?Vaping Use  ? Vaping Use: Never used  ?Substance and Sexual Activity  ? Alcohol use: Yes  ?  Comment: now soically  ? Drug use: Not Currently  ?  Types: Marijuana  ? Sexual activity: Yes  ?  Birth control/protection: None  ?Other Topics Concern  ? Not on file  ?Social History Narrative  ? Right handed  ? ?Social Determinants of Health  ? ?Financial Resource Strain: Not on file  ?Food Insecurity: Not on file  ?Transportation Needs: Not on file  ?Physical Activity: Not on file  ?Stress: Not on file  ?Social Connections: Not on file  ?Intimate Partner Violence: Not on file  ? ? ?ROS ?Per hpi ? ?Objective  ? ?Vitals as reported by the patient: ?There were no vitals filed for this visit. ? ?Diagnoses and all orders for this visit: ? ?Vaginal bleeding during pregnancy ?-     Ambulatory referral to Obstetrics / Gynecology ?-     hCG, quantitative, pregnancy; Future ?-     US Pelvic Complete With Transvaginal; Future ? ?Pelvic pain ?-     Ambulatory referral to Obstetrics / Gynecology ?-     hCG, quantitative, pregnancy; Future ?-     US Pelvic Complete With Transvaginal; Future ? ? ?PLAN ?Advised she repeat beta quant today. We will try to help her with transportation but hoping she can get this through medicare.  ?Have ordered ultrasounds as well to confirm evacuation of uterus or intrauterine pregnancy to correlate to beta quant findings.  ?Referral to obgyn placed. ? ? ? ?I discussed the assessment and treatment plan with the patient. The patient was provided an opportunity to ask questions and all were answered. The patient agreed with the plan and demonstrated an understanding of the instructions. ?  ?The patient was advised to call back or seek an in-person evaluation if the symptoms worsen or if the condition fails to  improve as anticipated. ? ?I provided 15 minutes of non-face-to-face time during this encounter. ? ?Janeece Agee, NP ?

## 2021-11-24 NOTE — Telephone Encounter (Signed)
Called and spoke with patient to let her know she should reach out to her OB and she responded that the last time she was told she would be unable to be seen unless she paid her bill. Due to the urgency of this matter I moved her appt that was scheduled for Monday to today virtually with Delfino Lovett so she can see someone and receive some help and advice. I messaged Richard to let him know.  ?

## 2021-11-24 NOTE — Telephone Encounter (Signed)
Patient was scheduled a Mychart visit this morning ?

## 2021-11-25 NOTE — Progress Notes (Signed)
Attempted to call patient LVM to return call at the office ?

## 2021-11-28 ENCOUNTER — Ambulatory Visit: Payer: Medicare Other | Admitting: Registered Nurse

## 2021-11-30 ENCOUNTER — Encounter: Payer: Self-pay | Admitting: Gastroenterology

## 2021-11-30 ENCOUNTER — Ambulatory Visit (INDEPENDENT_AMBULATORY_CARE_PROVIDER_SITE_OTHER): Payer: Medicare Other | Admitting: Gastroenterology

## 2021-11-30 VITALS — BP 100/64 | HR 62 | Ht 65.0 in | Wt 124.4 lb

## 2021-11-30 DIAGNOSIS — R112 Nausea with vomiting, unspecified: Secondary | ICD-10-CM | POA: Diagnosis not present

## 2021-11-30 NOTE — Patient Instructions (Signed)
It was my pleasure to provide care to you today. Based on our discussion, I am providing you with my recommendations below: ? ?RECOMMENDATION(S):  ? ?I am recommending a trial of FDGard - 2 capsules taken twice daily for 4 weeks.  Please take FDGard 30 to 60 minutes before meals with water.  There is no distinct pattern of side effects with FDGard, but, sometimes patients experience a mild tingling sensation in the gut within the first 30 minutes. This sensation should subsequently subside.   ? ?I have recommended a HIDA scan to follow-up on your gallbladder.  ? ?HIDA SCAN: ? ?You have been scheduled for a small bowel follow thru at [facility] Radiology (1st floor of the hospital) on Wednesday 12/07/21 at 1 pm. Please arrive @ 12:30 pm for registration.  ? ?PREP:  ? ?Please DO NOT eat or drink anything 6 hours prior to your test.  ? ?NEED TO RESCHEDULE?  ? ?Please call radiology at [phone]. ? ?WHY ARE YOU HAVING THIS EXAM? ? ?Hepatobiliary (HIDA) scan is an imaging procedure used to diagnose problems in the liver, gallbladder and bile ducts. In the HIDA scan, a radioactive chemical or tracer is injected into a vein in your arm. The tracer is handled by the liver like bile. Bile is a fluid produced and excreted by your liver that helps your digestive system break down fats in the foods you eat. Bile is stored in your gallbladder and the gallbladder releases the bile when you eat a meal. A special nuclear medicine scanner (gamma camera) tracks the flow of the tracer from your liver into your gallbladder and small intestine.  ? ?WHAT CAN I EXPECT DURING THE EXAM? ?  ?You'll be asked to change into a hospital gown before your HIDA scan begins. Your health care team will position you on a table, usually on your back. The radioactive tracer is then injected into a vein in your arm.The tracer travels through your bloodstream to your liver, where it's taken up by the bile-producing cells. The radioactive tracer  travels with the bile from your liver into your gallbladder and through your bile ducts to your small intestine.You may feel some pressure while the radioactive tracer is injected into your vein. As you lie on the table, a special gamma camera is positioned over your abdomen taking pictures of the tracer as it moves through your body. The gamma camera takes pictures continually for about an hour. You'll need to keep still during the HIDA scan. This can become uncomfortable, but you may find that you can lessen the discomfort by taking deep breaths and thinking about other things. Tell your health care team if you're uncomfortable. The radiologist will watch on a computer the progress of the radioactive tracer through your body. The HIDA scan may be stopped when the radioactive tracer is seen in the gallbladder and enters your small intestine. This typically takes about an hour. In some cases extra imaging will be performed if original images aren't satisfactory, if morphine is given to help visualize the gallbladder or if the medication CCK is given to look at the contraction of the gallbladder. This test typically takes 2 hours to complete. ? ? ? ?FOLLOW UP: ? ?After your procedure, you will receive a call from my office staff regarding my recommendation for follow up. ? ?BMI: ? ?If you are age 38 or older, your body mass index should be between 23-30. Your Body mass index is 20.7 kg/m?Marland Kitchen If this is out  of the aforementioned range listed, please consider follow up with your Primary Care Provider. ? ?If you are age 38 or younger, your body mass index should be between 19-25. Your Body mass index is 20.7 kg/m?Marland Kitchen If this is out of the aformentioned range listed, please consider follow up with your Primary Care Provider.  ? ?MY CHART: ? ?The [facility] providers would like to encourage you to use [facility] to communicate with providers for non-urgent requests or questions.  Due to long hold times on the telephone, sending  your provider a message by [facility] may be a faster and more efficient way to get a response.  Please allow 48 business hours for a response.  Please remember that this is for non-urgent requests.  ? ?Thank you for trusting me with your gastrointestinal care!   ? ?Thornton Park, MD, MPH ? ?

## 2021-11-30 NOTE — Progress Notes (Signed)
? ?Referring Provider: Janeece Agee, NP ?Primary Care Physician:  Janeece Agee, NP ? ?Chief complaint:  Abdominal pain, nausea, anorexia ? ? ?IMPRESSION:  ?Chronic nausea, anorexia and postprandial cramping ?   - not exclude by ultrasound, EGD, CT scan, prior colonoscopy ?   - previously evaluated in [place] ?   - not relieved despite multiple treatment trials as outlined below ?Unintentional weight loss ?Anxiety - not currently followed by psychiatry ?Recently lost a pregnancy ? ?Etiology for ongoing dyspepsia is unclear. Her weight loss is concerning. Anxiety may be contributing. Must consider biliary pain, gastroparesis, pancreatitis, carbohydrate malabsorption, bacterial overgrowth, and functional dyspepsia.  ? ? ?PLAN: ?- HIDA with CCK ?- Trial of FDGuard (samples provided today) - discussed potential exacerbation of reflux ?- Consider GES if HIDA is negative ?- Continue PPI and H2Blocker ?- Avoid foods that trigger symptoms ?- Continue smaller, more frequent meals to minimize gastric distension and to facilitate emptying ?- Work to improve anxiety as this may ultimately improve her GI ?- Consider stress reduction techniques ?- Consider referral to [facility] for a second opinion ? ?Please see the "Patient Instructions" section for addition details about the plan. ? ?HPI: Kimberly Harper is a 36 y.o. female initially referred by NP [facility] for further evaluation of abdominal pain.  She was most recently seen in the office by Willette Cluster 08/18/21.  She has bipolar 1 and 2 disorder, major depressive disorder, generalized anxiety disorder, borderline personality disorder, panic disorder, history of suicidal ideation and attempted overdose overdose.  She also has a history of body dysmorphia.  Today she is very tearful because she recently lost a pregnancy.  ? ?Longstanding history of GI symptoms including anorexia, nausea, post-prandial diarrhea, abdominal cramping, and intermittent vomiting. She has been  unable to eat and is losing weight, although she thinks she may have gained some weight back last week.  She is unable to eat a full meal. Feels like her stomach is regecting what she wants to eat. She forces herself to eat. Then she feels nauseated. She is hungry but feels like she can't eat.  Has bowel movement every day to every other day. No change in appetite and ability to eat based on bowel habits.  ? ?Syptoms started with anxiety and she is wondering if this did something to her digestive tract that she has damaged the lining of her digestive tract.  ? ?Anxiety continues to fluctuate.  ? ?Recently lost a pregnancy and she is concerned that she wasn't eating well during that time.  ? ?Symptoms previously evaluated in [place] including a colonoscopy, abdominal imaging and some stimulation tests in 2018. She doesn't remember getting a response or any conclusive results.  ? ?More recent testing includes:  ?- Labs 03/19/20 normal lipase, amylase, CMP, CK ?- Abdominal ultrasound 09/16/20 showed mild increased liver parenchymal echogenicity, but no acute changes ?- EGD 01/04/21 showed fundic gland polyps, normal gastric biopsies, and duodenitis. Esophageal biopsies were normal.  ?- CT abd/pelvis 07/29/21 showed constipation, subcentimenter right hepatic lobe density that is too small to characterize ? ?Treatment trials include: ?   - Dicyclomine and Levsin caused nausea.  ?   - Ondansetron provides relief of nausea.  ?   - hyoscyamine was not helpful ?   - Carafate did not improve her symptoms ?   - some improvement with omeprazole BID, but not sustained rlief ?   - famotidine was not helpful  ?   - Linzess 145 mcg caused diarrhea ?   -  Linzess 72 mcg improved constipation but did not change her other symptoms ?   - She has tried to eat smaller meals throughout the day but she finds the frequency very disruptive to life.  ?   - ginger is not tolerated ? ?The most recent records from Dr. Kateri Plummer show that she wished  to have a second opinion and see a different gastroenterologist. However, this appointment was scheduled. I offered to have her reschedule this appointment today or see a different provider but she declined.  ? ? ? ?Past Medical History:  ?Diagnosis Date  ? Alcoholic (HCC)   ? Anemia   ? [facility] 01/17/2020  ? Anxiety   ? Asthma   ? Bipolar 1 disorder (HCC)   ? Blood transfusion without reported diagnosis   ? [facility] 01/17/2020  ? Chronic headaches   ? Depression   ? [facility] 01/17/2020  ? Major depressive disorder   ? Substance abuse (HCC)   ? [facility] 01/17/2020  ? ? ?Past Surgical History:  ?Procedure Laterality Date  ? CESAREAN SECTION N/A   ? [facility] 01/17/2020  ? COLONOSCOPY    ? earlier in year 2022  ? DILATION AND CURETTAGE OF UTERUS    ? HAND SURGERY Right   ? WISDOM TOOTH EXTRACTION    ? ? ?Current Outpatient Medications  ?Medication Sig Dispense Refill  ? ALPRAZolam (XANAX) 1 MG tablet Take 1 tablet (1 mg total) by mouth 2 (two) times daily as needed. 30 tablet 0  ? propranolol (INDERAL) 20 MG tablet Take 1 tablet (20 mg total) by mouth daily. 30 tablet 0  ? pramipexole (MIRAPEX) 0.25 MG tablet Take 1 tablet (0.25 mg total) by mouth 3 (three) times daily. (Patient not taking: Reported on 11/30/2021) 270 tablet 0  ? sertraline (ZOLOFT) 100 MG tablet Take 0.5 tablets (50 mg total) by mouth daily for 14 days, THEN 1 tablet (100 mg total) daily. (Patient not taking: Reported on 11/30/2021) 83 tablet 0  ? ?No current facility-administered medications for this visit.  ? ? ?Allergies as of 11/30/2021 - Review Complete 11/30/2021  ?Allergen Reaction Noted  ? Prednisone Other (See Comments) 11/30/2021  ? Multiple vitamins-iron Swelling 12/07/2019  ? ? ? ? ?Physical Exam: ?General:   Alert,  well-nourished, pleasant and cooperative in NAD. She is waring her sunglasses because she finds the lights very bright in our clinic ?Weight today 136 pounds ?Weight 09/07/20 143 pounds ?Weight 08/18/21: 130 pounds ?Weight today  124 pounds ? ?Head:  Normocephalic and atraumatic. ?Eyes:  Sclera clear, no icterus.   Conjunctiva pink. ?Abdomen:  Soft, nontender, nondistended, normal bowel sounds, no rebound or guarding. No hepatosplenomegaly.   ?Neurologic:  Alert and  oriented x4;  grossly nonfocal ?Skin:  Intact without significant lesions or rashes. ?Psych:  Alert and cooperative. Normal mood and affect. ? ? ? ? ?Haydee Jabbour L. Orvan Falconer, MD, MPH ?11/30/2021, 4:18 PM ? ? ? ? ?

## 2021-12-04 ENCOUNTER — Other Ambulatory Visit: Payer: Self-pay | Admitting: Registered Nurse

## 2021-12-04 DIAGNOSIS — F411 Generalized anxiety disorder: Secondary | ICD-10-CM

## 2021-12-04 DIAGNOSIS — F410 Panic disorder [episodic paroxysmal anxiety] without agoraphobia: Secondary | ICD-10-CM

## 2021-12-04 DIAGNOSIS — G43829 Menstrual migraine, not intractable, without status migrainosus: Secondary | ICD-10-CM

## 2021-12-07 ENCOUNTER — Ambulatory Visit (HOSPITAL_COMMUNITY): Payer: Medicare Other

## 2021-12-12 ENCOUNTER — Other Ambulatory Visit: Payer: Self-pay | Admitting: Gastroenterology

## 2021-12-12 DIAGNOSIS — R109 Unspecified abdominal pain: Secondary | ICD-10-CM

## 2021-12-13 ENCOUNTER — Other Ambulatory Visit: Payer: Self-pay | Admitting: Registered Nurse

## 2021-12-13 DIAGNOSIS — R1013 Epigastric pain: Secondary | ICD-10-CM

## 2021-12-13 DIAGNOSIS — F411 Generalized anxiety disorder: Secondary | ICD-10-CM

## 2021-12-13 MED ORDER — ALPRAZOLAM 1 MG PO TABS: 1.0000 mg | ORAL_TABLET | Freq: Two times a day (BID) | ORAL | 0 refills | Status: DC | PRN

## 2021-12-13 MED ORDER — OMEPRAZOLE 20 MG PO CPDR: 20.0000 mg | DELAYED_RELEASE_CAPSULE | Freq: Two times a day (BID) | ORAL | 3 refills | Status: DC

## 2021-12-13 NOTE — Telephone Encounter (Signed)
Patient is requesting a refill of the following medications: Requested Prescriptions   Pending Prescriptions Disp Refills   ALPRAZolam (XANAX) 1 MG tablet 30 tablet 0    Sig: Take 1 tablet (1 mg total) by mouth 2 (two) times daily as needed.    Date of patient request: 12/13/2021 Last office visit: 11/24/2021 Date of last refill: 09/21/2021 Last refill amount: 30 tablets  Follow up time period per chart: none

## 2021-12-21 ENCOUNTER — Ambulatory Visit
Admission: EM | Admit: 2021-12-21 | Discharge: 2021-12-21 | Disposition: A | Discharge status: Home / Self Care | Payer: Medicare Other | Attending: Urgent Care | Admitting: Urgent Care

## 2021-12-21 DIAGNOSIS — B3731 Acute candidiasis of vulva and vagina: Secondary | ICD-10-CM | POA: Diagnosis present

## 2021-12-21 MED ORDER — FLUCONAZOLE 150 MG PO TABS: ORAL_TABLET | ORAL | 2 refills | Status: DC

## 2021-12-21 NOTE — Discharge Instructions (Signed)
You were tested today for gonorrhea, chlamydia, trichomonas, BV, and yeast. We will call you with the results of your test once received. Given your symptoms and history of yeast infections, please start the diflucan today. May repeat in 3 days if needed. Please follow up with gynecology as discussed.  If your aptima swab is positive for BV or tricuh, Tinidazole would be the treatment of choice

## 2021-12-21 NOTE — ED Triage Notes (Signed)
Pt c/o "another yeast infection" states has "white clumpy" vaginal discharge x 1 week.

## 2021-12-21 NOTE — ED Provider Notes (Signed)
[name]    CSN: [id number] Arrival date & time: 12/21/21  0941      History   Chief Complaint Chief Complaint  Patient presents with   Vaginal Discharge    HPI Kimberly Harper is a 36 y.o. female.   Pleasant 36 year old female presents today with a 1 week history of vaginal discharge and white clumping.  She has a known history of a very "sensitive private area".  Was seen on 4/23 and was positive for BV and yeast at that time.  Since her last visit with us, she sustained a spontaneous miscarriage.  She has a follow-up with gynecology scheduled in the next couple weeks.  She has not had any further bleeding or pelvic pain, other than what was induced when trying over-the-counter Monistat vaginal applicator.   Vaginal Discharge  Past Medical History:  Diagnosis Date   Alcoholic (HCC)    Anemia    [facility]reesia 01/17/2020   Anxiety    Asthma    Bipolar 1 disorder (HCC)    Blood transfusion without reported diagnosis    [facility]reesia 01/17/2020   Chronic headaches    Depression    Phreesia 01/17/2020   Major depressive disorder    Substance abuse (HCC)    Phreesia 01/17/2020    Patient Active Problem List   Diagnosis Date Noted   Other allergic rhinitis 06/22/2021   Asthma 06/22/2021   Heartburn 06/22/2021   Panic disorder 04/12/2020   GAD (generalized anxiety disorder) 03/18/2020   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   Borderline personality disorder (HCC) 02/06/2020   Bipolar 2 disorder, major depressive episode (HCC) 12/06/2019    Past Surgical History:  Procedure Laterality Date   CESAREAN SECTION N/A    Phreesia 01/17/2020   COLONOSCOPY     earlier in year 2022   DILATION AND CURETTAGE OF UTERUS     HAND SURGERY Right    WISDOM TOOTH EXTRACTION      OB History     Gravida  1   Para      Term      Preterm      AB      Living         SAB      IAB      Ectopic      Multiple      Live Births               Home  Medications    Prior to Admission medications   Medication Sig Start Date End Date Taking? Authorizing Provider  fluconazole (DIFLUCAN) 150 MG tablet Take one PO today. May repeat in 72 hours as needed 12/21/21  Yes Danyeal Akens L, PA  pramipexole (MIRAPEX) 0.25 MG tablet Take 1 tablet (0.25 mg total) by mouth 3 (three) times daily. 09/21/21  Yes Janeece AgeeMorrow, Richard, NP  ALPRAZolam Prudy Feeler(XANAX) 1 MG tablet Take 1 tablet (1 mg total) by mouth 2 (two) times daily as needed. 12/13/21   Janeece AgeeMorrow, Richard, NP  omeprazole (PRILOSEC) 20 MG capsule Take 1 capsule (20 mg total) by mouth 2 (two) times daily before a meal. 12/13/21   Janeece AgeeMorrow, Richard, NP  propranolol (INDERAL) 20 MG tablet TAKE 1 TABLET(20 MG) BY MOUTH DAILY 12/04/21   Janeece AgeeMorrow, Richard, NP  sertraline (ZOLOFT) 100 MG tablet Take 0.5 tablets (50 mg total) by mouth daily for 14 days, THEN 1 tablet (100 mg total) daily. Patient not taking: Reported on 11/30/2021 11/16/21 02/14/22  Janeece AgeeMorrow, Richard, NP  Family History Family History  Problem Relation Age of Onset   Hypertension Mother    Diabetes Mother    Stroke Mother    Depression Mother    Depression Sister    Irritable bowel syndrome Sister    Asthma Brother    Allergic rhinitis Brother    Migraines Brother    Depression Brother    Stroke Maternal Grandmother    Diabetes Maternal Grandmother    Alcohol abuse Maternal Grandfather    Stroke Paternal Grandmother    Breast cancer Paternal Grandmother    Alcohol abuse Paternal Grandfather    Lupus Half-Sister    Angioedema Neg Hx    Eczema Neg Hx    Immunodeficiency Neg Hx    Atopy Neg Hx     Social History Social History   Tobacco Use   Smoking status: Former    Types: Cigarettes    Quit date: 09/07/2006    Years since quitting: 15.2   Smokeless tobacco: Never  Vaping Use   Vaping Use: Never used  Substance Use Topics   Alcohol use: Yes    Comment: now soically   Drug use: Not Currently    Types: Marijuana     Allergies    Prednisone and Multiple vitamins-iron   Review of Systems Review of Systems  Genitourinary:  Positive for vaginal discharge.    Physical Exam Triage Vital Signs ED Triage Vitals [12/21/21 0952]  Enc Vitals Group     BP 119/76     Pulse Rate 72     Resp 18     Temp 98.5 F (36.9 C)     Temp Source Oral     SpO2 98 %     Weight      Height      Head Circumference      Peak Flow      Pain Score 0     Pain Loc      Pain Edu?      Excl. in GC?    No data found.  Updated Vital Signs BP 119/76 (BP Location: Left Arm)   Pulse 72   Temp 98.5 F (36.9 C) (Oral)   Resp 18   LMP 10/11/2021   SpO2 98%   Breastfeeding No   Visual Acuity Right Eye Distance:   Left Eye Distance:   Bilateral Distance:    Right Eye Near:   Left Eye Near:    Bilateral Near:     Physical Exam Vitals and nursing note reviewed.  Constitutional:      General: She is not in acute distress.    Appearance: Normal appearance. She is well-developed and normal weight. She is not ill-appearing, toxic-appearing or diaphoretic.  HENT:     Head: Normocephalic and atraumatic.  Eyes:     Conjunctiva/sclera: Conjunctivae normal.  Cardiovascular:     Rate and Rhythm: Normal rate and regular rhythm.     Heart sounds: No murmur heard. Pulmonary:     Effort: Pulmonary effort is normal. No respiratory distress.     Breath sounds: Normal breath sounds.  Abdominal:     General: There is no distension.     Palpations: Abdomen is soft.     Tenderness: There is no abdominal tenderness. There is no guarding or rebound.  Genitourinary:    Comments: Pelvic exam offered, declined Musculoskeletal:        General: No swelling.     Cervical back: Neck supple.  Skin:    General: Skin  is warm and dry.     Capillary Refill: Capillary refill takes less than 2 seconds.     Findings: No rash.  Neurological:     Mental Status: She is alert.  Psychiatric:        Mood and Affect: Mood normal.     UC  Treatments / Results  Labs (all labs ordered are listed, but only abnormal results are displayed) Labs Reviewed  CERVICOVAGINAL ANCILLARY ONLY    EKG   Radiology No results found.  Procedures Procedures (including critical care time)  Medications Ordered in UC Medications - No data to display  Initial Impression / Assessment and Plan / UC Course  I have reviewed the triage vital signs and the nursing notes.  Pertinent labs & imaging results that were available during my care of the patient were reviewed by me and considered in my medical decision making (see chart for details).     Vaginitis - likely candidal. Diflucan x 1 today, may repeat in 72 hours if needed. Given hx of BV in the past, if positive upon receipt of Aptima results, pt may benefit from 2g tinidazole x 2 days in place of flagyl. F/U with gyne as discussed.  Final Clinical Impressions(s) / UC Diagnoses   Final diagnoses:  Candida vaginitis     Discharge Instructions      You were tested today for gonorrhea, chlamydia, trichomonas, BV, and yeast. We will call you with the results of your test once received. Given your symptoms and history of yeast infections, please start the diflucan today. May repeat in 3 days if needed. Please follow up with gynecology as discussed.  If your aptima swab is positive for BV or tricuh, Tinidazole would be the treatment of choice   ED Prescriptions     Medication Sig Dispense Auth. Provider   fluconazole (DIFLUCAN) 150 MG tablet Take one PO today. May repeat in 72 hours as needed 2 tablet Nathanel Tallman, [place] L, [place]      PDMP not reviewed this encounter.   Maretta Bees, [place] 12/21/21 1026

## 2021-12-22 LAB — CERVICOVAGINAL ANCILLARY ONLY
Bacterial Vaginitis (gardnerella): NEGATIVE
Candida Glabrata: NEGATIVE
Candida Vaginitis: NEGATIVE
Chlamydia: NEGATIVE
Comment: NEGATIVE
Comment: NEGATIVE
Comment: NEGATIVE
Comment: NEGATIVE
Comment: NEGATIVE
Comment: NORMAL
Neisseria Gonorrhea: NEGATIVE
Trichomonas: NEGATIVE

## 2021-12-26 ENCOUNTER — Ambulatory Visit (HOSPITAL_COMMUNITY): Admission: RE | Admit: 2021-12-26 | Payer: Medicare Other | Source: Ambulatory Visit

## 2021-12-26 NOTE — Telephone Encounter (Signed)
I called and spoke with pharmacy and she states that patient picked up enough to last her for about 15 days and she will send another prior authorization

## 2021-12-28 NOTE — Telephone Encounter (Signed)
Kimberly Harper has just completed the form that was sent from the pharmacy to be faxed back.

## 2022-01-05 ENCOUNTER — Ambulatory Visit (HOSPITAL_COMMUNITY)
Admission: RE | Admit: 2022-01-05 | Discharge: 2022-01-05 | Disposition: A | Discharge status: Home / Self Care | Payer: Medicare Other | Source: Ambulatory Visit | Attending: Gastroenterology | Admitting: Gastroenterology

## 2022-01-05 DIAGNOSIS — R112 Nausea with vomiting, unspecified: Secondary | ICD-10-CM | POA: Insufficient documentation

## 2022-01-05 MED ORDER — TECHNETIUM TC 99M MEBROFENIN IV KIT
5.1000 | PACK | Freq: Once | INTRAVENOUS | Status: AC | PRN
  Administered 2022-01-05: 5.1 via INTRAVENOUS

## 2022-01-06 ENCOUNTER — Other Ambulatory Visit: Payer: Self-pay

## 2022-01-06 DIAGNOSIS — R109 Unspecified abdominal pain: Secondary | ICD-10-CM

## 2022-01-06 DIAGNOSIS — R634 Abnormal weight loss: Secondary | ICD-10-CM

## 2022-01-06 DIAGNOSIS — R112 Nausea with vomiting, unspecified: Secondary | ICD-10-CM

## 2022-01-10 ENCOUNTER — Ambulatory Visit (INDEPENDENT_AMBULATORY_CARE_PROVIDER_SITE_OTHER): Payer: Medicare Other | Admitting: Obstetrics & Gynecology

## 2022-01-10 ENCOUNTER — Encounter: Payer: Self-pay | Admitting: Obstetrics & Gynecology

## 2022-01-10 VITALS — BP 109/68 | HR 54 | Ht 65.0 in | Wt 126.0 lb

## 2022-01-10 DIAGNOSIS — O039 Complete or unspecified spontaneous abortion without complication: Secondary | ICD-10-CM | POA: Diagnosis not present

## 2022-01-10 NOTE — Progress Notes (Signed)
Patient ID: Kimberly Harper, female   DOB: [birth year], 36 y.o.   MRN: [id number]  Chief Complaint  Patient presents with   New Patient (Initial Visit)    HPI Kimberly Harper is a 36 y.o. female.  S/p complete miscarriage early May with HCG<30 last check. Patient's last menstrual period was 12/26/2021. She does not request BCM today HPI  Past Medical History:  Diagnosis Date   Alcoholic (HCC)    Anemia    Phreesia 01/17/2020   Anxiety    Asthma    Bipolar 1 disorder (HCC)    Blood transfusion without reported diagnosis    [facility]reesia 01/17/2020   Chronic headaches    Depression    Phreesia 01/17/2020   Major depressive disorder    Substance abuse (HCC)    Phreesia 01/17/2020    Past Surgical History:  Procedure Laterality Date   CESAREAN SECTION N/A    Phreesia 01/17/2020   COLONOSCOPY     earlier in year 2022   DILATION AND CURETTAGE OF UTERUS     HAND SURGERY Right    WISDOM TOOTH EXTRACTION      Family History  Problem Relation Age of Onset   Hypertension Mother    Diabetes Mother    Stroke Mother    Depression Mother    Depression Sister    Irritable bowel syndrome Sister    Asthma Brother    Allergic rhinitis Brother    Migraines Brother    Depression Brother    Stroke Maternal Grandmother    Diabetes Maternal Grandmother    Alcohol abuse Maternal Grandfather    Stroke Paternal Grandmother    Breast cancer Paternal Grandmother    Alcohol abuse Paternal Grandfather    Lupus Half-Sister    Angioedema Neg Hx    Eczema Neg Hx    Immunodeficiency Neg Hx    Atopy Neg Hx     Social History Social History   Tobacco Use   Smoking status: Former    Types: Cigarettes    Quit date: 09/07/2006    Years since quitting: 15.3   Smokeless tobacco: Never  Vaping Use   Vaping Use: Never used  Substance Use Topics   Alcohol use: Yes    Comment: now soically   Drug use: Not Currently    Types: Marijuana    Allergies  Allergen Reactions    Prednisone Other (See Comments)    Altered mental status   Multiple Vitamins-Iron Swelling    States that vitamins, folic acid and iron have caused swelling of tongue and gums    Current Outpatient Medications  Medication Sig Dispense Refill   ALPRAZolam (XANAX) 1 MG tablet Take 1 tablet (1 mg total) by mouth 2 (two) times daily as needed. 30 tablet 0   cholecalciferol (VITAMIN D3) 25 MCG (1000 UNIT) tablet Take 1,000 Units by mouth daily.     fluconazole (DIFLUCAN) 150 MG tablet Take one PO today. May repeat in 72 hours as needed 2 tablet 2   MACA ROOT PO Take by mouth.     omeprazole (PRILOSEC) 20 MG capsule Take 1 capsule (20 mg total) by mouth 2 (two) times daily before a meal. 180 capsule 3   pramipexole (MIRAPEX) 0.25 MG tablet Take 1 tablet (0.25 mg total) by mouth 3 (three) times daily. 270 tablet 0   propranolol (INDERAL) 20 MG tablet TAKE 1 TABLET(20 MG) BY MOUTH DAILY 90 tablet 3   sertraline (ZOLOFT) 100 MG tablet Take 0.5 tablets (50  mg total) by mouth daily for 14 days, THEN 1 tablet (100 mg total) daily. (Patient not taking: Reported on 11/30/2021) 83 tablet 0   No current facility-administered medications for this visit.    Review of Systems Review of Systems  Gastrointestinal:  Positive for diarrhea.  Genitourinary:  Negative for pelvic Harper, vaginal bleeding and vaginal discharge.    Blood pressure 109/68, pulse (!) 54, height 5\' 5"  (1.651 m), weight 126 lb (57.2 kg), last menstrual period 12/26/2021.  Physical Exam Physical Exam Vitals and nursing note reviewed.  Constitutional:      Appearance: Normal appearance.  Pulmonary:     Effort: Pulmonary effort is normal.  Neurological:     General: No focal deficit present.     Mental Status: She is alert.  Psychiatric:        Mood and Affect: Mood normal.        Behavior: Behavior normal.     Data Reviewed Labs from [facility]  Assessment Complete SAB  Plan Recommend PNV and notify 02/25/2022 early of positive  pregnancy test or if she wants [facility]    [facility] 01/10/2022, 4:46 PM

## 2022-01-10 NOTE — Progress Notes (Signed)
Pt is in office for f/u SAB - pt states was first of May.  Pt is still having some pain.  Pt states she gets BV and yeast frequently.

## 2022-01-12 ENCOUNTER — Telehealth: Payer: Self-pay

## 2022-01-12 NOTE — Telephone Encounter (Signed)
-----   Message from April H Pait sent at 01/12/2022 12:16 PM EDT -----  ----- Message ----- From: Lorenza Cambridge Sent: 01/12/2022  10:29 AM EDT To: April H Pait  Patient declined ----- Message ----- From: Sinda Du H Sent: 01/09/2022   7:29 AM EDT To: Lorenza Cambridge   ----- Message ----- From: Lucky Cowboy, RN Sent: 01/06/2022   2:24 PM EDT To: April H Pait; Marylynn Pearson  Pt needs to be scheduled for gastric emptying scan. Orders are in. Thanks!

## 2022-01-17 ENCOUNTER — Other Ambulatory Visit: Payer: Self-pay | Admitting: Registered Nurse

## 2022-01-17 DIAGNOSIS — F411 Generalized anxiety disorder: Secondary | ICD-10-CM

## 2022-01-24 ENCOUNTER — Other Ambulatory Visit: Payer: Self-pay | Admitting: Registered Nurse

## 2022-01-24 DIAGNOSIS — F411 Generalized anxiety disorder: Secondary | ICD-10-CM

## 2022-01-25 ENCOUNTER — Other Ambulatory Visit: Payer: Self-pay | Admitting: Registered Nurse

## 2022-01-25 ENCOUNTER — Ambulatory Visit (HOSPITAL_COMMUNITY): Admission: RE | Admit: 2022-01-25 | Payer: Medicare Other | Source: Ambulatory Visit

## 2022-01-25 ENCOUNTER — Telehealth: Payer: Self-pay

## 2022-01-25 DIAGNOSIS — F411 Generalized anxiety disorder: Secondary | ICD-10-CM

## 2022-01-25 NOTE — Telephone Encounter (Signed)
Patient is requesting a refill of the following medications: Requested Prescriptions   Pending Prescriptions Disp Refills   ALPRAZolam (XANAX) 1 MG tablet [Pharmacy Med Name: ALPRAZOLAM 1MG  TABLETS] 30 tablet     Sig: TAKE 1 TABLET(1 MG) BY MOUTH TWICE DAILY AS NEEDED    Date of patient request: 01/25/22 Last office visit: 11/24/21 Date of last refill: 12/13/21 Last refill amount: 30

## 2022-01-25 NOTE — Telephone Encounter (Signed)
Pt called in stating she put in a request for Xanax refill several weeks ago and was calling upset it had not been filled I informed the pt I did not receive the previous request but I can see the request that was just sent by the pharmacy and that I have sent this to the provider I did explain Gerlene Burdock is not in the office today and that there would likely be a delay, pt expressed dissatisfaction about this and stated she needs her medication I told her I understand but the providers are given 48-72 hours to answer refill requests and I apologized that the original request did not make it to [place] but at this time I cannot rush the provider and the pt then hung up without other notice.

## 2022-01-26 NOTE — Telephone Encounter (Signed)
Will provide 1 mo refill for this patient. Have advised she re-establish with psychiatry, particularly as I am leaving this office.  PDMP consulted, last fill on 12/15/21 for #30 (15 day supply, PRN use).  Jari Sportsman, NP

## 2022-01-30 ENCOUNTER — Ambulatory Visit: Payer: Medicare Other | Admitting: Registered Nurse

## 2022-02-03 ENCOUNTER — Telehealth: Payer: Self-pay

## 2022-02-03 ENCOUNTER — Ambulatory Visit
Admission: EM | Admit: 2022-02-03 | Discharge: 2022-02-03 | Disposition: A | Discharge status: Home / Self Care | Payer: Medicare Other | Attending: Student | Admitting: Student

## 2022-02-03 DIAGNOSIS — N760 Acute vaginitis: Secondary | ICD-10-CM | POA: Diagnosis present

## 2022-02-03 MED ORDER — METRONIDAZOLE 500 MG PO TABS: 500.0000 mg | ORAL_TABLET | Freq: Two times a day (BID) | ORAL | 0 refills | Status: DC

## 2022-02-03 NOTE — ED Triage Notes (Signed)
Pt c/o vaginal odor, concerned for BV after menstrual cycle.

## 2022-02-03 NOTE — ED Provider Notes (Signed)
[name]    CSN: [id number] Arrival date & time: 02/03/22  1446      History   Chief Complaint Chief Complaint  Patient presents with   Vaginitis    HPI Kimberly Harper is a 36 y.o. female presenting with vaginitis. History BV.  States she recently completed her period, now experiencing odor, discomfort.  Crampy lower abdominal pain. Denies hematuria, dysuria, frequency, urgency, back pain, n/v/d, fevers/chills, abdnormal vaginal rashes/lesions. Denies new partners or STI risk. States she is not pregnant or breastfeeding.  HPI  Past Medical History:  Diagnosis Date   Alcoholic (HCC)    Anemia    Phreesia 01/17/2020   Anxiety    Asthma    Bipolar 1 disorder (HCC)    Blood transfusion without reported diagnosis    [facility]reesia 01/17/2020   Chronic headaches    Depression    Phreesia 01/17/2020   Major depressive disorder    Substance abuse (HCC)    Phreesia 01/17/2020    Patient Active Problem List   Diagnosis Date Noted   Other allergic rhinitis 06/22/2021   Asthma 06/22/2021   Heartburn 06/22/2021   Panic disorder 04/12/2020   GAD (generalized anxiety disorder) 03/18/2020   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   Borderline personality disorder (HCC) 02/06/2020   Bipolar 2 disorder, major depressive episode (HCC) 12/06/2019    Past Surgical History:  Procedure Laterality Date   CESAREAN SECTION N/A    Phreesia 01/17/2020   COLONOSCOPY     earlier in year 2022   DILATION AND CURETTAGE OF UTERUS     HAND SURGERY Right    WISDOM TOOTH EXTRACTION      OB History     Gravida  4   Para  1   Term      Preterm  1   AB  3   Living  1      SAB  3   IAB      Ectopic      Multiple      Live Births  1            Home Medications    Prior to Admission medications   Medication Sig Start Date End Date Taking? Authorizing Provider  metroNIDAZOLE (FLAGYL) 500 MG tablet Take 1 tablet (500 mg total) by mouth 2 (two) times  daily. Avoid alcohol while taking this medication and for 2 days after 02/03/22  Yes Cheree Ditto, Lyman Speller, PA-C  ALPRAZolam Prudy Feeler) 1 MG tablet TAKE 1 TABLET(1 MG) BY MOUTH TWICE DAILY AS NEEDED 01/26/22   Janeece Agee, NP  cholecalciferol (VITAMIN D3) 25 MCG (1000 UNIT) tablet Take 1,000 Units by mouth daily.    [provider]  fluconazole (DIFLUCAN) 150 MG tablet Take one PO today. May repeat in 72 hours as needed 12/21/21   Crain, Whitney L, PA  MACA ROOT PO Take by mouth.    [provider]  omeprazole (PRILOSEC) 20 MG capsule Take 1 capsule (20 mg total) by mouth 2 (two) times daily before a meal. 12/13/21   Janeece Agee, NP  pramipexole (MIRAPEX) 0.25 MG tablet TAKE 1 TABLET(0.25 MG) BY MOUTH THREE TIMES DAILY 01/17/22   Janeece Agee, NP  propranolol (INDERAL) 20 MG tablet TAKE 1 TABLET(20 MG) BY MOUTH DAILY 12/04/21   Janeece Agee, NP  sertraline (ZOLOFT) 100 MG tablet Take 0.5 tablets (50 mg total) by mouth daily for 14 days, THEN 1 tablet (100 mg total) daily. Patient not taking: Reported on 11/30/2021 11/16/21  02/14/22  Janeece Agee, NP    Family History Family History  Problem Relation Age of Onset   Hypertension Mother    Diabetes Mother    Stroke Mother    Depression Mother    Depression Sister    Irritable bowel syndrome Sister    Asthma Brother    Allergic rhinitis Brother    Migraines Brother    Depression Brother    Stroke Maternal Grandmother    Diabetes Maternal Grandmother    Alcohol abuse Maternal Grandfather    Stroke Paternal Grandmother    Breast cancer Paternal Grandmother    Alcohol abuse Paternal Grandfather    Lupus Half-Sister    Angioedema Neg Hx    Eczema Neg Hx    Immunodeficiency Neg Hx    Atopy Neg Hx     Social History Social History   Tobacco Use   Smoking status: Former    Types: Cigarettes    Quit date: 09/07/2006    Years since quitting: 15.4   Smokeless tobacco: Never  Vaping Use   Vaping Use: Never used   Substance Use Topics   Alcohol use: Yes    Comment: now soically   Drug use: Not Currently    Types: Marijuana     Allergies   Prednisone and Multiple vitamins-iron   Review of Systems Review of Systems  Constitutional:  Negative for chills and fever.  HENT:  Negative for sore throat.   Eyes:  Negative for pain and redness.  Respiratory:  Negative for shortness of breath.   Cardiovascular:  Negative for chest pain.  Gastrointestinal:  Negative for abdominal pain, diarrhea, nausea and vomiting.  Genitourinary:  Positive for vaginal discharge. Negative for decreased urine volume, difficulty urinating, dysuria, flank pain, frequency, genital sores, hematuria and urgency.  Musculoskeletal:  Negative for back pain.  Skin:  Negative for rash.  All other systems reviewed and are negative.    Physical Exam Triage Vital Signs ED Triage Vitals  Enc Vitals Group     BP 02/03/22 1512 107/65     Pulse Rate 02/03/22 1512 60     Resp 02/03/22 1512 18     Temp 02/03/22 1512 98 F (36.7 C)     Temp Source 02/03/22 1512 Oral     SpO2 02/03/22 1512 98 %     Weight --      Height --      Head Circumference --      Peak Flow --      Pain Score 02/03/22 1513 0     Pain Loc --      Pain Edu? --      Excl. in GC? --    No data found.  Updated Vital Signs BP 107/65 (BP Location: Left Arm)   Pulse 60   Temp 98 F (36.7 C) (Oral)   Resp 18   LMP 12/26/2021   SpO2 98%   Visual Acuity Right Eye Distance:   Left Eye Distance:   Bilateral Distance:    Right Eye Near:   Left Eye Near:    Bilateral Near:     Physical Exam Vitals reviewed.  Constitutional:      General: She is not in acute distress.    Appearance: Normal appearance. She is not ill-appearing.  HENT:     Head: Normocephalic and atraumatic.     Mouth/Throat:     Mouth: Mucous membranes are moist.     Comments: Moist mucous membranes Eyes:  Extraocular Movements: Extraocular movements intact.     Pupils:  Pupils are equal, round, and reactive to light.  Cardiovascular:     Rate and Rhythm: Normal rate and regular rhythm.     Heart sounds: Normal heart sounds.  Pulmonary:     Effort: Pulmonary effort is normal.     Breath sounds: Normal breath sounds. No wheezing, rhonchi or rales.  Abdominal:     General: Bowel sounds are normal. There is no distension.     Palpations: Abdomen is soft. There is no mass.     Tenderness: There is no abdominal tenderness. There is no right CVA tenderness, left CVA tenderness, guarding or rebound.  Genitourinary:    Comments: deferred Skin:    General: Skin is warm.     Capillary Refill: Capillary refill takes less than 2 seconds.     Comments: Good skin turgor  Neurological:     General: No focal deficit present.     Mental Status: She is alert and oriented to person, place, and time.  Psychiatric:        Mood and Affect: Mood normal.        Behavior: Behavior normal.      UC Treatments / Results  Labs (all labs ordered are listed, but only abnormal results are displayed) Labs Reviewed  CERVICOVAGINAL ANCILLARY ONLY    EKG   Radiology No results found.  Procedures Procedures (including critical care time)  Medications Ordered in UC Medications - No data to display  Initial Impression / Assessment and Plan / UC Course  I have reviewed the triage vital signs and the nursing notes.  Pertinent labs & imaging results that were available during my care of the patient were reviewed by me and considered in my medical decision making (see chart for details).     This patient is a very pleasant 36 y.o. year old female presenting with vaginitis - suspect BV. Afebrile, nontachycardic, no reproducible abd pain or CVAT.  Will send self-swab for G/C, trich, yeast, BV testing. Declines HIV, RPR. Safe sex precautions.  States she is not pregnant or breastfeeding.  Flagyl sent.   ED return precautions discussed. Patient verbalizes understanding  and agreement.   Final Clinical Impressions(s) / UC Diagnoses   Final diagnoses:  Vaginitis and vulvovaginitis     Discharge Instructions      -For bacterial vaginosis, start the antibiotic-Flagyl (metronidazole), 2 pills daily for 7 days.  You can take this with food if you have a sensitive stomach.  Avoid alcohol while taking this medication and for 2 days after as this will cause severe nausea and vomiting.    ED Prescriptions     Medication Sig Dispense Auth. Provider   metroNIDAZOLE (FLAGYL) 500 MG tablet Take 1 tablet (500 mg total) by mouth 2 (two) times daily. Avoid alcohol while taking this medication and for 2 days after 14 tablet Rhys Martini, PA-C      PDMP not reviewed this encounter.   Rhys Martini, PA-C 02/03/22 1601

## 2022-02-03 NOTE — Discharge Instructions (Addendum)
-  For bacterial vaginosis, start the antibiotic-Flagyl (metronidazole), 2 pills daily for 7 days.  You can take this with food if you have a sensitive stomach.  Avoid alcohol while taking this medication and for 2 days after as this will cause severe nausea and vomiting.  

## 2022-02-06 LAB — CERVICOVAGINAL ANCILLARY ONLY
Bacterial Vaginitis (gardnerella): POSITIVE — AB
Candida Glabrata: NEGATIVE
Candida Vaginitis: NEGATIVE
Chlamydia: NEGATIVE
Comment: NEGATIVE
Comment: NEGATIVE
Comment: NEGATIVE
Comment: NEGATIVE
Comment: NEGATIVE
Comment: NORMAL
Neisseria Gonorrhea: NEGATIVE
Trichomonas: NEGATIVE

## 2022-02-08 ENCOUNTER — Encounter: Payer: Self-pay | Admitting: Registered Nurse

## 2022-02-08 ENCOUNTER — Ambulatory Visit (INDEPENDENT_AMBULATORY_CARE_PROVIDER_SITE_OTHER): Payer: Medicare Other | Admitting: Registered Nurse

## 2022-02-08 ENCOUNTER — Other Ambulatory Visit: Payer: Self-pay

## 2022-02-08 VITALS — BP 118/64 | HR 80 | Temp 97.8°F | Resp 18 | Ht 65.0 in | Wt 125.0 lb

## 2022-02-08 DIAGNOSIS — R1013 Epigastric pain: Secondary | ICD-10-CM | POA: Diagnosis not present

## 2022-02-08 MED ORDER — PANTOPRAZOLE SODIUM 40 MG PO TBEC: 40.0000 mg | DELAYED_RELEASE_TABLET | Freq: Every day | ORAL | 3 refills | Status: DC

## 2022-02-08 MED ORDER — CAPSAICIN 0.033 % EX CREA: 1.0000 | TOPICAL_CREAM | Freq: Every day | CUTANEOUS | 0 refills | Status: DC

## 2022-02-08 MED ORDER — OMEPRAZOLE 40 MG PO CPDR: 40.0000 mg | DELAYED_RELEASE_CAPSULE | Freq: Every day | ORAL | 3 refills | Status: DC

## 2022-02-08 NOTE — Assessment & Plan Note (Signed)
Restart omeprazole at 40mg  po qd.  Add topical capsaicin Call if worsening or failing to improve.

## 2022-02-08 NOTE — Patient Instructions (Addendum)
Ms. Meinders -   Kimberly Hew, MD Kimberly Doe, MD Kimberly Moynahan Early, NP Jiles Prows, DNP  Those are the names I recommend for a new PCP - all are fairly local.  We can restart omeprazole at 40mg  and use capsaicin daily on the skin of the abdomen. If omeprazole doesn't work, we can try an alternative like pantoprazole, lansoprazole, dexlansoprazole, rabeprazole, etc.   I'll be in touch!  Thanks,  Rich   If you have lab work done today you will be contacted with your lab results within the next 2 weeks.  If you have not heard from then please contact us. The fastest way to get your results is to register for My Chart.   IF you received an x-ray today, you will receive an invoice from [facility]. Please contact [facility] at [phone] with questions or concerns regarding your invoice.   IF you received labwork today, you will receive an invoice from [facility]. Please contact [facility] at [phone] with questions or concerns regarding your invoice.   Our billing staff will not be able to assist you with questions regarding bills from these companies.  You will be contacted with the lab results as soon as they are available. The fastest way to get your results is to activate your My Chart account. Instructions are located on the last page of this paperwork. If you have not heard from [phone] regarding the results in 2 weeks, please contact this office.

## 2022-02-08 NOTE — Progress Notes (Signed)
Established Patient Office Visit  Subjective:  Patient ID: Kimberly Harper, female    DOB: 1986-07-06  Age: 36 y.o. MRN: [id number]  CC:  Chief Complaint  Patient presents with   Follow-up    Patient states she is still having stomach issues.    HPI Kimberly Harper presents for follow up   Continues to have stomach issues. Pain after eating, poor appetite. Weight is fairly low at BMI of 20.80. down from 08/18/21 but fairly consistent since. She did complete emptying studies - normal gallbladder emptying with Dr. Orvan Falconer, normal gastric emptying with [facility].   She did have a miscarriage in early May, beta hcg trended down appropriately, normal menses in June.   She did decrease her omeprazole to 20mg  po qd   Outpatient Medications Prior to Visit  Medication Sig Dispense Refill   ALPRAZolam (XANAX) 1 MG tablet TAKE 1 TABLET(1 MG) BY MOUTH TWICE DAILY AS NEEDED 30 tablet 0   cholecalciferol (VITAMIN D3) 25 MCG (1000 UNIT) tablet Take 1,000 Units by mouth daily.     fluconazole (DIFLUCAN) 150 MG tablet Take one PO today. May repeat in 72 hours as needed 2 tablet 2   MACA ROOT PO Take by mouth.     metroNIDAZOLE (FLAGYL) 500 MG tablet Take 1 tablet (500 mg total) by mouth 2 (two) times daily. Avoid alcohol while taking this medication and for 2 days after 14 tablet 0   pramipexole (MIRAPEX) 0.25 MG tablet TAKE 1 TABLET(0.25 MG) BY MOUTH THREE TIMES DAILY 270 tablet 0   propranolol (INDERAL) 20 MG tablet TAKE 1 TABLET(20 MG) BY MOUTH DAILY 90 tablet 3   sertraline (ZOLOFT) 100 MG tablet Take 0.5 tablets (50 mg total) by mouth daily for 14 days, THEN 1 tablet (100 mg total) daily. 83 tablet 0   omeprazole (PRILOSEC) 20 MG capsule Take 1 capsule (20 mg total) by mouth 2 (two) times daily before a meal. 180 capsule 3   No facility-administered medications prior to visit.    Review of Systems  Constitutional: Negative.   HENT: Negative.    Eyes: Negative.   Respiratory:  Negative.    Cardiovascular: Negative.   Gastrointestinal:  Positive for abdominal pain. Negative for abdominal distention, anal bleeding, blood in stool, constipation, diarrhea, nausea, rectal pain and vomiting.  Genitourinary: Negative.   Musculoskeletal: Negative.   Skin: Negative.   Neurological: Negative.   Psychiatric/Behavioral: Negative.    All other systems reviewed and are negative.     Objective:     BP 118/64   Pulse 80   Temp 97.8 F (36.6 C) (Temporal)   Resp 18   Ht 5\' 5"  (1.651 m)   Wt 125 lb (56.7 kg)   LMP 12/26/2021   SpO2 98%   BMI 20.80 kg/m   Wt Readings from Last 3 Encounters:  02/08/22 125 lb (56.7 kg)  01/10/22 126 lb (57.2 kg)  11/30/21 124 lb 6.4 oz (56.4 kg)   Physical Exam Vitals and nursing note reviewed.  Constitutional:      General: She is not in acute distress.    Appearance: Normal appearance. She is normal weight. She is not ill-appearing, toxic-appearing or diaphoretic.  Cardiovascular:     Rate and Rhythm: Normal rate and regular rhythm.     Heart sounds: Normal heart sounds. No murmur heard.    No friction rub. No gallop.  Pulmonary:     Effort: Pulmonary effort is normal. No respiratory distress.  Breath sounds: Normal breath sounds. No stridor. No wheezing, rhonchi or rales.  Chest:     Chest wall: No tenderness.  Skin:    General: Skin is warm and dry.  Neurological:     General: No focal deficit present.     Mental Status: She is alert and oriented to person, place, and time. Mental status is at baseline.  Psychiatric:        Mood and Affect: Mood normal.        Behavior: Behavior normal.        Thought Content: Thought content normal.        Judgment: Judgment normal.     No results found for any visits on 02/08/22.    The ASCVD Risk score (Arnett DK, et al., 2019) failed to calculate for the following reasons:   The 2019 ASCVD risk score is only valid for ages 26 to 36    Assessment & Plan:   Problem  List Items Addressed This Visit       Other   Epigastric pain - Primary    Restart omeprazole at 40mg  po qd.  Add topical capsaicin Call if worsening or failing to improve.      Relevant Medications   Capsaicin 0.033 % CREA   omeprazole (PRILOSEC) 40 MG capsule    Meds ordered this encounter  Medications   DISCONTD: pantoprazole (PROTONIX) 40 MG tablet    Sig: Take 1 tablet (40 mg total) by mouth daily.    Dispense:  30 tablet    Refill:  3    Order Specific Question:   Supervising Provider    Answer:   , JEFFREY R [[id number]]   Capsaicin 0.033 % CREA    Sig: Apply 1 Application topically daily.    Dispense:  56.6 g    Refill:  0    Order Specific Question:   Supervising Provider    Answer:   Neva Seat, JEFFREY R [[id number]]   omeprazole (PRILOSEC) 40 MG capsule    Sig: Take 1 capsule (40 mg total) by mouth daily.    Dispense:  90 capsule    Refill:  3    Order Specific Question:   Supervising Provider    Answer:   Neva Seat, JEFFREY R [[id number]]    Return if symptoms worsen or fail to improve.    Neva Seat, NP

## 2022-02-09 ENCOUNTER — Ambulatory Visit: Payer: Medicare Other | Admitting: Registered Nurse

## 2022-02-09 NOTE — Telephone Encounter (Signed)
This concern has been previously addressed by myself and/or another provider.  If they patient has ongoing concerns, they can contact me at their convenience.  Thank you,  Rich Leisha Trinkle, NP 

## 2022-02-15 ENCOUNTER — Ambulatory Visit: Payer: Medicare Other | Admitting: Registered Nurse

## 2022-02-16 ENCOUNTER — Ambulatory Visit
Admission: EM | Admit: 2022-02-16 | Discharge: 2022-02-16 | Disposition: A | Discharge status: Home / Self Care | Payer: Medicare Other | Attending: Physician Assistant | Admitting: Physician Assistant

## 2022-02-16 DIAGNOSIS — N760 Acute vaginitis: Secondary | ICD-10-CM | POA: Insufficient documentation

## 2022-02-16 MED ORDER — FLUCONAZOLE 150 MG PO TABS: 150.0000 mg | ORAL_TABLET | Freq: Every day | ORAL | 0 refills | Status: DC

## 2022-02-16 MED ORDER — IBREXAFUNGERP CITRATE 150 MG PO TABS
2.0000 | ORAL_TABLET | Freq: Two times a day (BID) | ORAL | 0 refills | Status: AC
Start: 2022-02-16 — End: 2022-02-17

## 2022-02-16 NOTE — ED Provider Notes (Signed)
[name]    CSN: [id number] Arrival date & time: 02/16/22  1657      History   Chief Complaint Chief Complaint  Patient presents with   vaginal rash    HPI Kimberly Harper is a 36 y.o. female.   Patient here today for evaluation of continued yeast infection. She reports white discharge. She is agreeable to repeat STD screening as she may have had additional exposure since last testing two weeks ago. She reports she took her metronidazole as directed as well as two doses of diflucan three days apart without resolution of symptoms.   The history is provided by the patient.    Past Medical History:  Diagnosis Date   Alcoholic (HCC)    Anemia    [employer] 01/17/2020   Anxiety    Asthma    Bipolar 1 disorder (HCC)    Blood transfusion without reported diagnosis    [employer] 01/17/2020   Chronic headaches    Depression    [employer]reesia 01/17/2020   Major depressive disorder    Substance abuse (HCC)    [employer]reesia 01/17/2020    Patient Active Problem List   Diagnosis Date Noted   Epigastric pain 02/08/2022   Other allergic rhinitis 06/22/2021   Asthma 06/22/2021   Heartburn 06/22/2021   Panic disorder 04/12/2020   GAD (generalized anxiety disorder) 03/18/2020   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   Borderline personality disorder (HCC) 02/06/2020   Bipolar 2 disorder, major depressive episode (HCC) 12/06/2019    Past Surgical History:  Procedure Laterality Date   CESAREAN SECTION N/A    [employer] 01/17/2020   COLONOSCOPY     earlier in year 2022   DILATION AND CURETTAGE OF UTERUS     HAND SURGERY Right    WISDOM TOOTH EXTRACTION      OB History     Gravida  4   Para  1   Term      Preterm  1   AB  3   Living  1      SAB  3   IAB      Ectopic      Multiple      Live Births  1            Home Medications    Prior to Admission medications   Medication Sig Start Date End Date Taking? Authorizing Provider  Ibrexafungerp  Citrate 150 MG TABS Take 2 tablets by mouth in the morning and at bedtime for 1 day. 02/16/22 02/17/22 Yes Tomi Bamberger, PA-C  ALPRAZolam Prudy Feeler) 1 MG tablet TAKE 1 TABLET(1 MG) BY MOUTH TWICE DAILY AS NEEDED 01/26/22   Janeece Agee, NP  Capsaicin 0.033 % CREA Apply 1 Application topically daily. 02/08/22   Janeece Agee, NP  cholecalciferol (VITAMIN D3) 25 MCG (1000 UNIT) tablet Take 1,000 Units by mouth daily.    [provider]  MACA ROOT PO Take by mouth.    [provider]  metroNIDAZOLE (FLAGYL) 500 MG tablet Take 1 tablet (500 mg total) by mouth 2 (two) times daily. Avoid alcohol while taking this medication and for 2 days after 02/03/22   Rhys Martini, PA-C  omeprazole (PRILOSEC) 40 MG capsule Take 1 capsule (40 mg total) by mouth daily. 02/08/22   Janeece Agee, NP  pramipexole (MIRAPEX) 0.25 MG tablet TAKE 1 TABLET(0.25 MG) BY MOUTH THREE TIMES DAILY 01/17/22   Janeece Agee, NP  propranolol (INDERAL) 20 MG tablet TAKE 1 TABLET(20 MG) BY MOUTH  DAILY 12/04/21   Janeece Agee, NP  sertraline (ZOLOFT) 100 MG tablet Take 0.5 tablets (50 mg total) by mouth daily for 14 days, THEN 1 tablet (100 mg total) daily. 11/16/21 02/14/22  Janeece Agee, NP    Family History Family History  Problem Relation Age of Onset   Hypertension Mother    Diabetes Mother    Stroke Mother    Depression Mother    Depression Sister    Irritable bowel syndrome Sister    Asthma Brother    Allergic rhinitis Brother    Migraines Brother    Depression Brother    Stroke Maternal Grandmother    Diabetes Maternal Grandmother    Alcohol abuse Maternal Grandfather    Stroke Paternal Grandmother    Breast cancer Paternal Grandmother    Alcohol abuse Paternal Grandfather    Lupus Half-Sister    Angioedema Neg Hx    Eczema Neg Hx    Immunodeficiency Neg Hx    Atopy Neg Hx     Social History Social History   Tobacco Use   Smoking status: Former    Types: Cigarettes    Quit date:  09/07/2006    Years since quitting: 15.4   Smokeless tobacco: Never  Vaping Use   Vaping Use: Never used  Substance Use Topics   Alcohol use: Yes    Comment: now soically   Drug use: Not Currently    Types: Marijuana     Allergies   Prednisone and Multiple vitamins-iron   Review of Systems Review of Systems  Constitutional:  Negative for chills and fever.  Eyes:  Negative for discharge and redness.  Gastrointestinal:  Negative for nausea and vomiting.  Genitourinary:  Positive for vaginal discharge.     Physical Exam Triage Vital Signs ED Triage Vitals  Enc Vitals Group     BP      Pulse      Resp      Temp      Temp src      SpO2      Weight      Height      Head Circumference      Peak Flow      Pain Score      Pain Loc      Pain Edu?      Excl. in GC?    No data found.  Updated Vital Signs BP 124/79   Pulse 64   Temp 98.5 F (36.9 C)   Resp 18   SpO2 99%   Physical Exam Vitals and nursing note reviewed.  Constitutional:      General: She is not in acute distress.    Appearance: Normal appearance. She is not ill-appearing.  HENT:     Head: Normocephalic and atraumatic.  Eyes:     Conjunctiva/sclera: Conjunctivae normal.  Cardiovascular:     Rate and Rhythm: Normal rate.  Pulmonary:     Effort: Pulmonary effort is normal.  Neurological:     Mental Status: She is alert.  Psychiatric:        Mood and Affect: Mood normal.        Behavior: Behavior normal.        Thought Content: Thought content normal.      UC Treatments / Results  Labs (all labs ordered are listed, but only abnormal results are displayed) Labs Reviewed  CERVICOVAGINAL ANCILLARY ONLY    EKG   Radiology No results found.  Procedures Procedures (including critical care  time)  Medications Ordered in UC Medications - No data to display  Initial Impression / Assessment and Plan / UC Course  I have reviewed the triage vital signs and the nursing  notes.  Pertinent labs & imaging results that were available during my care of the patient were reviewed by me and considered in my medical decision making (see chart for details).    Discussed trial of another course of diflucan vs topical treatment, patient requests different treatment as she states diflucan oral and topical antifungal is not helpful. Will trial ibrexafungerp. STD screening ordered. Encouraged follow up with any concerns.   Final Clinical Impressions(s) / UC Diagnoses   Final diagnoses:  Vaginitis and vulvovaginitis   Discharge Instructions   None    ED Prescriptions     Medication Sig Dispense Auth. Provider   Ibrexafungerp Citrate 150 MG TABS Take 2 tablets by mouth in the morning and at bedtime for 1 day. 4 tablet Tomi Bamberger, PA-C      PDMP not reviewed this encounter.   Tomi Bamberger, PA-C 02/16/22 1742

## 2022-02-16 NOTE — ED Triage Notes (Signed)
Pt presents post yeast tx with co of yeast still present.

## 2022-02-17 ENCOUNTER — Telehealth: Payer: Self-pay

## 2022-02-17 LAB — CERVICOVAGINAL ANCILLARY ONLY
Bacterial Vaginitis (gardnerella): NEGATIVE
Candida Glabrata: NEGATIVE
Candida Vaginitis: NEGATIVE
Chlamydia: NEGATIVE
Comment: NEGATIVE
Comment: NEGATIVE
Comment: NEGATIVE
Comment: NEGATIVE
Comment: NEGATIVE
Comment: NORMAL
Neisseria Gonorrhea: NEGATIVE
Trichomonas: NEGATIVE

## 2022-02-17 NOTE — Telephone Encounter (Signed)
Pt is calling to get scheduled with Jiles Prows, NP since Janeece Agee, NP is leaving the office.  I advised the pt upon approval I would call her back to get her scheduled.   Please advise

## 2022-02-20 ENCOUNTER — Other Ambulatory Visit: Payer: Self-pay | Admitting: Registered Nurse

## 2022-02-20 DIAGNOSIS — F411 Generalized anxiety disorder: Secondary | ICD-10-CM

## 2022-02-20 NOTE — Telephone Encounter (Signed)
Ok by me! Thanks,  Rich

## 2022-02-21 MED ORDER — ALPRAZOLAM 1 MG PO TABS: 1.0000 mg | ORAL_TABLET | Freq: Two times a day (BID) | ORAL | 0 refills | Status: DC | PRN

## 2022-02-21 NOTE — Telephone Encounter (Signed)
Last fill 01/26/22 for #30 no refills (15 day supply) Last visit 11/16/21  PDMP consulted  Jari Sportsman, NP

## 2022-02-23 NOTE — Telephone Encounter (Signed)
Called back and she stated that she would be out of medication before the first TOC appt 04/14/22. I advise the pt that she may want to check with the other 3 providers that he recommended or see if an additional refill could be given to make it to 04/14/22. I gave her [facility] number and transferred to check for their availability.

## 2022-03-01 ENCOUNTER — Encounter: Payer: Self-pay | Admitting: Internal Medicine

## 2022-03-01 ENCOUNTER — Ambulatory Visit (INDEPENDENT_AMBULATORY_CARE_PROVIDER_SITE_OTHER): Payer: Medicare Other | Admitting: Internal Medicine

## 2022-03-01 ENCOUNTER — Other Ambulatory Visit: Payer: Self-pay

## 2022-03-01 VITALS — BP 90/66 | HR 60 | Temp 98.1°F | Resp 14 | Ht 65.0 in | Wt 124.8 lb

## 2022-03-01 DIAGNOSIS — E739 Lactose intolerance, unspecified: Secondary | ICD-10-CM

## 2022-03-01 DIAGNOSIS — R1013 Epigastric pain: Secondary | ICD-10-CM

## 2022-03-01 DIAGNOSIS — F3181 Bipolar II disorder: Secondary | ICD-10-CM

## 2022-03-01 DIAGNOSIS — R12 Heartburn: Secondary | ICD-10-CM

## 2022-03-01 DIAGNOSIS — R110 Nausea: Secondary | ICD-10-CM

## 2022-03-01 DIAGNOSIS — R0789 Other chest pain: Secondary | ICD-10-CM

## 2022-03-01 DIAGNOSIS — R079 Chest pain, unspecified: Secondary | ICD-10-CM | POA: Insufficient documentation

## 2022-03-01 DIAGNOSIS — F603 Borderline personality disorder: Secondary | ICD-10-CM

## 2022-03-01 DIAGNOSIS — R21 Rash and other nonspecific skin eruption: Secondary | ICD-10-CM

## 2022-03-01 HISTORY — DX: Lactose intolerance, unspecified: E73.9

## 2022-03-01 HISTORY — DX: Chest pain, unspecified: R07.9

## 2022-03-01 HISTORY — DX: Nausea: R11.0

## 2022-03-01 MED ORDER — ONDANSETRON HCL 4 MG PO TABS: 4.0000 mg | ORAL_TABLET | Freq: Three times a day (TID) | ORAL | 5 refills | Status: DC | PRN

## 2022-03-01 NOTE — Progress Notes (Signed)
Phone: [phone]  New patient visit  Visit Date: 03/01/2022 Patient: Kimberly Harper   DOB: 1986/06/23   36 y.o. Female  MRN: [id number]   Assessment and Plan:   This is a very kind lady who presents distressed and tearful because of several years of severe unrelenting dyspepsia/gerd/heartburn.. now progressed to a week of severe chest pain that is worsened by food.  Situation is complicated by loss of follow up to psych and working 2 jobs and recent pregnancy loss.   In my medical opinion, the dyspepsia/chest pain/gerd seems to be a severe esophageal pain that is not connected significantly to her stress/anxiety- but probably is eosinophilic esophagitis or other rare painful esophageal condition (despite last years egd not being revealing).  So the main focus was to palliate her nausea and get her to try some other things while trying to get back in with GI for further workup (and I think repeat EGD likely)  [name] was seen today for transfer of care.Marland Kitchen  Epigastric pain Assessment & Plan: egd prior neg- Full dose ppi failing Encouraged pepcid complete and maalox trials on top of omeprazole Encouraged r/s with Dr. Orvan Falconer or alt GI for possible repeat egd and other testing I do not think the primary cause of this is anxiety, although anxiety might worsen it. .   Reviewed extensive plan per Dr. Orvan Falconer    Orders: -     H. pylori antigen, stool  Heartburn Assessment & Plan: Stable, not controlled, despite omeprazole Pain in chest feels like heartburn Had endoscopy earlier 12/2020 Dr. Orvan Falconer was negative.    Orders: -     H. pylori antigen, stool  Nausea Assessment & Plan: Stable, intermittent When eating, esp with strong flavor/odor She request zofran   Orders: -     Ondansetron HCl; Take 1 tablet (4 mg total) by mouth every 8 (eight) hours as needed for nausea or vomiting.  Dispense: 90 tablet; Refill: 5  Other chest pain Overview: 03/2021 xr negative. And  only affected by food.  Per Dr. Orvan Falconer: Symptoms previously evaluated in [facility] including a colonoscopy, abdominal imaging and some stimulation tests in 2018. She doesn't remember getting a response or any conclusive results.    labs 03/19/20 normal lipase, amylase, CMP, CK - Abdominal ultrasound 09/16/20 showed mild increased liver parenchymal echogenicity, but no acute changes - EGD 01/04/21 showed fundic gland polyps, normal gastric biopsies, and duodenitis. Esophageal biopsies were normal.  - CT abd/pelvis 07/29/21 showed constipation, subcentimenter right hepatic lobe density that is too small to characterize   Treatment trials include:    - Dicyclomine and Levsin caused nausea.     - Ondansetron provides relief of nausea.     - hyoscyamine was not helpful    - Carafate did not improve her symptoms    - some improvement with omeprazole BID, but not sustained rlief    - famotidine was not helpful     - Linzess 145 mcg caused diarrhea    - Linzess 72 mcg improved constipation but did not change her other symptoms    - She has tried to eat smaller meals throughout the day but she finds the frequency very disruptive to life.     - ginger is not tolerated  Assessment & Plan: Started 02/21/2022 A/w rare use of hot dog and soda. Its not a/w short of breath, only eating, so we arent doing another xr Recommend trial add pepcid complete and/or maalox, let me know if it helps.  Reviewed extensive plan per Dr. Orvan Falconer 11/2021 notes-  Reordered h.pylori testing I offered repeat labwork but she feels ok at the moment so she declined for now.    Borderline personality disorder (HCC) -     Ambulatory referral to Psychiatry -     Ambulatory referral to Psychology  Bipolar 2 disorder, major depressive episode (HCC) Assessment & Plan: Off all meds Declines restart Lithium didn't work  agrrees to psych and bh  Orders: -     Ambulatory referral to Psychiatry -     Ambulatory referral to  Psychology  Lactose intolerance Overview: Patient reported Still occasionally does cheese   Assessment & Plan: Encouraged lactaid and carbmaster products if going to drink milk.    Rash Overview: She reports occasional outbreak wrists, behind ears   Assessment & Plan: Encouraged her to photo and upload to [place] Suspect eczema     Health Maintenance  Topic Date Due   Hepatitis C Screening  Never done   TETANUS/TDAP  01/14/2024   PAP SMEAR-Modifier  10/12/2024   HIV Screening  Completed   HPV VACCINES  Aged Out   INFLUENZA VACCINE  Discontinued   COVID-19 Vaccine  Discontinued       Recommended follow up: 6 months, sooner if flaring No follow-ups on file. Future Appointments  Date Time Provider Department Center  03/13/2022  2:50 PM Leftwich-Kirby, Wilmer Floor, CNM [facility] None       Subjective:   Chief Complaint  Patient presents with   Transfer of care.    Pt here for transfer of care coming from Dr.Morrow. Concerns are appetite is not the best, heart burn and chest pains for about a week.   She would like help with dyspepsia with secondary food aggravated chest pain for last week.  She is out of zofran.  Has poor appetite/weight loss/severe dyspepsia.   For history taking, I took a per problem history from the patient and chart review adding and resolving problems as follows: Problem  Nausea  Chest Pain   03/2021 xr negative. And only affected by food.  Per Dr. Orvan Falconer: Symptoms previously evaluated in [facility] including a colonoscopy, abdominal imaging and some stimulation tests in 2018. She doesn't remember getting a response or any conclusive results.    labs 03/19/20 normal lipase, amylase, CMP, CK - Abdominal ultrasound 09/16/20 showed mild increased liver parenchymal echogenicity, but no acute changes - EGD 01/04/21 showed fundic gland polyps, normal gastric biopsies, and duodenitis. Esophageal biopsies were normal.  - CT abd/pelvis 07/29/21 showed  constipation, subcentimenter right hepatic lobe density that is too small to characterize   Treatment trials include:    - Dicyclomine and Levsin caused nausea.     - Ondansetron provides relief of nausea.     - hyoscyamine was not helpful    - Carafate did not improve her symptoms    - some improvement with omeprazole BID, but not sustained rlief    - famotidine was not helpful     - Linzess 145 mcg caused diarrhea    - Linzess 72 mcg improved constipation but did not change her other symptoms    - She has tried to eat smaller meals throughout the day but she finds the frequency very disruptive to life.     - ginger is not tolerated   Lactose Intolerance   Patient reported Still occasionally does cheese    Rash   She reports occasional outbreak wrists, behind ears    Epigastric Pain  Heartburn  Borderline Personality Disorder (Hcc)  Bipolar 2 Disorder, Major Depressive Episode (Hcc)  Pregnancy, Location Unknown (Resolved)   Formatting of this note might be different from the original. [facility] Quant Book Debara Mossbarger Language: English There is no such number on file (mobile). [phone] (home) [address] [place] [address] No e-mail address on record  Lab Hours [address] (7th floor [address], [address]): M-F 7:30-18:00 [facility]: M-F 8:00-17:30 [facility]: M-F 8:30-17:00 (closed from 12:00-13:00 for lunch)  [facility]: M-F 7:30-17:30 [address] 1st floor  [facility]: M-Sat (8-17:00) [address] 3rd floor, [place], [place] [address]  For Sunday blood draws, patient can come to [facility] outpatient lab. Same entrance as the ED, tell patient to ask for the lab there and they will be directed.   5/1: Pt presented to ED with positive pregnancy test at home 8 days ago, [redacted]w[redacted]d by LMP. Presented to OSH recently with bleeding, found to have hCG 500 and no IUP. Today hCG 222, no IUP.   5/3:   Asthma (Resolved)     Depression Screen    03/01/2022    3:04 PM 08/01/2021   10:53 AM 04/12/2021    1:23 PM 01/20/2021   12:59 PM  PHQ 2/9 Scores  PHQ - 2 Score 0  1 0  PHQ- 9 Score   8      Information is confidential and restricted. Go to Review Flowsheets to unlock data.   No results found for any visits on 03/01/22.   The following were reviewed and entered/updated in epic: Past Medical History:  Diagnosis Date   Alcoholic (HCC)    Anemia    Phreesia 01/17/2020   Anxiety    Asthma    Asthma 06/22/2021   Bipolar 1 disorder (HCC)    Blood transfusion without reported diagnosis    Phreesia 01/17/2020   Chest pain 03/01/2022   03/2021 xr negative.   Chronic headaches    Depression    Phreesia 01/17/2020   Lactose intolerance 03/01/2022   Major depressive disorder    Nausea 03/01/2022   Pregnancy, location unknown 11/21/2021   Formatting of this note might be different from the original. [facility] Quant Book Kahlea Frankland Language: English There is no such number on file (mobile). [phone] (home) [address] [place] [address] No e-mail address on record  Lab Hours [place] (7th floor [place], [address]): M-F 7:30-18:00 [facility]: M-F 8:00-17:30 [facility]: M-F 8:30-17:00 (cl   Substance abuse (HCC)    Phreesia 01/17/2020   Past Surgical History:  Procedure Laterality Date   CESAREAN SECTION N/A    Phreesia 01/17/2020   COLONOSCOPY     earlier in year 2022   DILATION AND CURETTAGE OF UTERUS     HAND SURGERY Right    WISDOM TOOTH EXTRACTION     Past Surgical History:  Procedure Laterality Date   CESAREAN SECTION N/A    Phreesia 01/17/2020   COLONOSCOPY     earlier in year 2022   DILATION AND CURETTAGE OF UTERUS     HAND SURGERY Right    WISDOM TOOTH EXTRACTION     Family Status  Relation Name Status   Mother  Alive   Father  Alive   Sister  (Not Specified)   Brother  Alive   MGM  Deceased   MGF  (Not  Specified)  PGM  (Not Specified)   PGF  (Not Specified)   H Sister  Alive   Neg Hx  (Not Specified)   Family History  Problem Relation Age of Onset   Hypertension Mother    Diabetes Mother    Stroke Mother    Depression Mother    Depression Sister    Irritable bowel syndrome Sister    Asthma Brother    Allergic rhinitis Brother    Migraines Brother    Depression Brother    Stroke Maternal Grandmother    Diabetes Maternal Grandmother    Alcohol abuse Maternal Grandfather    Stroke Paternal Grandmother    Breast cancer Paternal Grandmother    Alcohol abuse Paternal Grandfather    Lupus Half-Sister    Angioedema Neg Hx    Eczema Neg Hx    Immunodeficiency Neg Hx    Atopy Neg Hx     Current Outpatient Medications  Medication Sig Dispense Refill   omeprazole (PRILOSEC) 40 MG capsule      ALPRAZolam (XANAX) 1 MG tablet Take 1 tablet (1 mg total) by mouth 2 (two) times daily as needed for anxiety. 30 tablet 0   Capsaicin 0.033 % CREA Apply 1 Application topically daily. 56.6 g 0   cholecalciferol (VITAMIN D3) 25 MCG (1000 UNIT) tablet Take 1,000 Units by mouth daily.     MACA ROOT PO Take by mouth.     ondansetron (ZOFRAN) 4 MG tablet Take 1 tablet (4 mg total) by mouth every 8 (eight) hours as needed for nausea or vomiting. 90 tablet 5   pramipexole (MIRAPEX) 0.25 MG tablet TAKE 1 TABLET(0.25 MG) BY MOUTH THREE TIMES DAILY 270 tablet 0   propranolol (INDERAL) 20 MG tablet TAKE 1 TABLET(20 MG) BY MOUTH DAILY 90 tablet 3   sertraline (ZOLOFT) 100 MG tablet Take 0.5 tablets (50 mg total) by mouth daily for 14 days, THEN 1 tablet (100 mg total) daily. 83 tablet 0   No current facility-administered medications for this visit.    Allergies-reviewed and updated Allergies  Allergen Reactions   Prednisone Other (See Comments)    Altered mental status   Multiple Vitamins-Iron Swelling    States that vitamins, folic acid and iron have caused swelling of tongue and gums     Social History   Tobacco Use   Smoking status: Former    Types: Cigarettes    Quit date: 09/07/2006    Years since quitting: 15.4   Smokeless tobacco: Never  Vaping Use   Vaping Use: Never used  Substance Use Topics   Alcohol use: Yes    Comment: now soically   Drug use: Not Currently    Types: Marijuana     Immunization History  Administered Date(s) Administered   Tetanus 01/13/2014      Objective:  BP 90/66 (BP Location: Left Arm)   Pulse 60   Temp 98.1 F (36.7 C) (Temporal)   Resp 14   Ht 5\' 5"  (1.651 m)   Wt 124 lb 12.8 oz (56.6 kg)   SpO2 98%   BMI 20.77 kg/m   This is a very cordial and polite person who was a pleasure to meet.  Gen: NAD, resting comfortably,  HEENT: Mucous membranes are moist. Sclera conjunctiva and lids grossly normal Neck: no thyromegaly, no cervical lymphadenopathy CV: RRR no murmurs rubs or gallops Lungs: CTAB no crackles, wheeze, rhonchiExt: no edema Skin: warm, dry Neuro: grossly intact    Today's healthcare provider: , MD

## 2022-03-01 NOTE — Patient Instructions (Addendum)
  I have a theory you might have a condition known as eosinophilic esophagitis, which is difficult to confirm. I suspect your heartburn and chest pain are related to this .  Try to find your old egd biopsy results on mychart and share with me.  If they are negative, I think you might need repeat egd and repeat random biopsy when your symptoms are bothering you... or possibly esophageal manometry and/or pH testing.  I refilled zofran. Please use as necessary but be aware its not safe in pregnancy.  Encourage pepcid complete and maalox trials on top of omeprazole  Avoid all dairy, if you take, take with lactaid  Please   Call Dr. Orvan Falconer for repeat visit soon, there are some tests that still need completed from her.  This was her plan for you at last visit: - HIDA with [facility] - now done - Trial of FDGuard (samples provided today) - discussed potential exacerbation of reflux - Consider GES now that HIDA is negative - Continue omeprazole and H2Blocker - Avoid foods that trigger symptoms - Continue smaller, more frequent meals to minimize gastric distension and to facilitate emptying - Work to improve anxiety as this may ultimately improve her GI - Consider stress reduction techniques - Consider referral to [facility] for a second opinion  -also there is a NM gastroparesis study pending that needs done.

## 2022-03-01 NOTE — Assessment & Plan Note (Signed)
Encouraged her to photo and upload to [place] Suspect eczema

## 2022-03-01 NOTE — Assessment & Plan Note (Addendum)
Encouraged lactaid and carbmaster products if going to drink milk.

## 2022-03-01 NOTE — Assessment & Plan Note (Signed)
Stable, not controlled, despite omeprazole Pain in chest feels like heartburn Had endoscopy earlier 12/2020 Dr. Orvan Falconer was negative.

## 2022-03-01 NOTE — Assessment & Plan Note (Deleted)
Off all meds Declines restart Lithium didn't work  agrrees to psych and bh 

## 2022-03-01 NOTE — Assessment & Plan Note (Signed)
Off all meds Declines restart Lithium didn't work  agrrees to Diplomatic Services operational officer and bh

## 2022-03-01 NOTE — Assessment & Plan Note (Addendum)
egd prior neg- Full dose ppi failing Encouraged pepcid complete and maalox trials on top of omeprazole Encouraged r/s with Dr. Orvan Falconer or alt GI for possible repeat egd and other testing I do not think the primary cause of this is anxiety, although anxiety might worsen it. .   Reviewed extensive plan per Dr. Orvan Falconer

## 2022-03-01 NOTE — Assessment & Plan Note (Addendum)
Started 02/21/2022 A/w rare use of hot dog and soda. Its not a/w short of breath, only eating, so we arent doing another xr Recommend trial add pepcid complete and/or maalox, let me know if it helps.  Reviewed extensive plan per Dr. Orvan Falconer 11/2021 notes-  Reordered h.pylori testing I offered repeat labwork but she feels ok at the moment so she declined for now.

## 2022-03-01 NOTE — Assessment & Plan Note (Signed)
Stable, intermittent When eating, esp with strong flavor/odor She request zofran

## 2022-03-13 ENCOUNTER — Other Ambulatory Visit (HOSPITAL_COMMUNITY)
Admission: RE | Admit: 2022-03-13 | Discharge: 2022-03-13 | Disposition: A | Discharge status: Home / Self Care | Payer: Medicare Other | Source: Ambulatory Visit | Attending: Advanced Practice Midwife | Admitting: Advanced Practice Midwife

## 2022-03-13 ENCOUNTER — Encounter: Payer: Self-pay | Admitting: Advanced Practice Midwife

## 2022-03-13 ENCOUNTER — Ambulatory Visit (INDEPENDENT_AMBULATORY_CARE_PROVIDER_SITE_OTHER): Payer: Medicare Other | Admitting: Advanced Practice Midwife

## 2022-03-13 VITALS — BP 99/63 | HR 58 | Ht 65.0 in | Wt 123.0 lb

## 2022-03-13 DIAGNOSIS — R109 Unspecified abdominal pain: Secondary | ICD-10-CM | POA: Diagnosis not present

## 2022-03-13 DIAGNOSIS — Z113 Encounter for screening for infections with a predominantly sexual mode of transmission: Secondary | ICD-10-CM

## 2022-03-13 DIAGNOSIS — N898 Other specified noninflammatory disorders of vagina: Secondary | ICD-10-CM | POA: Diagnosis present

## 2022-03-13 DIAGNOSIS — N939 Abnormal uterine and vaginal bleeding, unspecified: Secondary | ICD-10-CM | POA: Diagnosis not present

## 2022-03-13 MED ORDER — LEVONORGESTREL-ETHINYL ESTRAD 0.1-20 MG-MCG PO TABS: 1.0000 | ORAL_TABLET | Freq: Every day | ORAL | 11 refills | Status: DC

## 2022-03-13 NOTE — Progress Notes (Addendum)
36 y.o GYN presents for vaginal discharge, usually brown or light pink, intermittent pain, cramps 4-8/10 x several months. Denies odor, itching.  Last PAP 09/2021  Negative

## 2022-03-13 NOTE — Progress Notes (Signed)
   GYNECOLOGY PROGRESS NOTE  History:  36 y.o. [id number] presents to [facility] office today for problem gyn visit. She reports her period is regular, 5-6 days, moderately heavy, with mild cramping. But in between, she now has brown or pink spotting 2-3 different times before her next menses.  She also reports recurrent BV and yeast infections.  She has an Rx for OCPs prescribed by her previous OB/Gyn and she reports taking 2-3 pills, then stopping, every month in attempt to treat the bleeding.[place]  She denies h/a, dizziness, shortness of breath, n/v, or fever/chills.    The following portions of the patient's history were reviewed and updated as appropriate: allergies, current medications, past family history, past medical history, past social history, past surgical history and problem list.   Health Maintenance Due  Topic Date Due   Hepatitis C Screening  Never done     Review of Systems:  Pertinent items are noted in HPI.   Objective:  Physical Exam Blood pressure 99/63, pulse (!) 58, height 5\' 5"  (1.651 m), weight 123 lb (55.8 kg), last menstrual period 02/28/2022. VS reviewed, nursing note reviewed,  Constitutional: well developed, well nourished, no distress HEENT: normocephalic CV: normal rate Pulm/chest wall: normal effort Breast Exam: deferred Abdomen: soft Neuro: alert and oriented x 3 Skin: warm, dry Psych: affect normal Pelvic exam: Cervix pink, visually closed, without lesion, scant white creamy discharge, vaginal walls and external genitalia normal Bimanual exam: Cervix 0/long/high, firm, anterior, neg CMT, uterus nontender, nonenlarged, adnexa without tenderness, enlargement, or mass  Assessment & Plan:  1. Vaginal discharge --Prevention of BV/yeast discussed, including probiotics, boric acid suppositories/tea tree oil, less frequent washing/use of soaps, cotton underwear/no underwear, condoms during intercourse.      2. Abdominal cramping   3. Routine screening for  STI (sexually transmitted infection) --HIV held because Medicare may not cover. Pt to follow up with insurance.  - Cervicovaginal ancillary only( [place]) - Hepatitis C Antibody - RPR  4. Abnormal uterine bleeding (AUB) --Metrorrhagia with in between bleeding x 2-3 months  - levonorgestrel-ethinyl estradiol (ALESSE) 0.1-20 MG-MCG tablet; Take 1 tablet by mouth daily.  Dispense: 28 tablet; Refill: 11 - 04-19-2005 PELVIC COMPLETE WITH TRANSVAGINAL; Future    Return in about 3 months (around 06/13/2022) for Gyn follow up for Abnormal Uterine Bleeding.   06/15/2022, CNM 4:47 PM

## 2022-03-14 LAB — CERVICOVAGINAL ANCILLARY ONLY
Bacterial Vaginitis (gardnerella): NEGATIVE
Candida Glabrata: NEGATIVE
Candida Vaginitis: NEGATIVE
Chlamydia: NEGATIVE
Comment: NEGATIVE
Comment: NEGATIVE
Comment: NEGATIVE
Comment: NEGATIVE
Comment: NEGATIVE
Comment: NORMAL
Neisseria Gonorrhea: NEGATIVE
Trichomonas: NEGATIVE

## 2022-03-14 LAB — HEPATITIS C ANTIBODY: Hep C Virus Ab: NONREACTIVE

## 2022-03-14 LAB — RPR: RPR Ser Ql: NONREACTIVE

## 2022-03-20 ENCOUNTER — Inpatient Hospital Stay: Admission: RE | Admit: 2022-03-20 | Payer: Medicare Other | Source: Ambulatory Visit

## 2022-04-04 ENCOUNTER — Ambulatory Visit: Payer: Medicare Other | Admitting: Gastroenterology

## 2022-04-21 ENCOUNTER — Telehealth: Payer: Self-pay | Admitting: Internal Medicine

## 2022-04-21 ENCOUNTER — Other Ambulatory Visit: Payer: Self-pay | Admitting: Family Medicine

## 2022-04-21 ENCOUNTER — Encounter: Payer: Self-pay | Admitting: Internal Medicine

## 2022-04-21 DIAGNOSIS — F411 Generalized anxiety disorder: Secondary | ICD-10-CM

## 2022-04-21 NOTE — Telephone Encounter (Signed)
  LAST APPOINTMENT DATE:  03/01/22  NEXT APPOINTMENT DATE: None  MEDICATION: - pramipexole (MIRAPEX) 0.25 MG tablet  - ALPRAZolam (XANAX) 1 MG tablet  Is the patient out of medication? Yes  PHARMACY: [facility] [place], [place]  Patient states: -She just realized refill requests were being denied due to medications being previously filled by former PCP.

## 2022-04-24 MED ORDER — PRAMIPEXOLE DIHYDROCHLORIDE 0.25 MG PO TABS: ORAL_TABLET | ORAL | 0 refills | Status: DC

## 2022-04-25 NOTE — Telephone Encounter (Signed)
Left vm for patient concerning this. 

## 2022-04-27 NOTE — Telephone Encounter (Signed)
Spoke with patient and scheduled an office visit for 10/9 @ 4 pm.

## 2022-05-01 ENCOUNTER — Ambulatory Visit: Payer: Medicare Other | Admitting: Internal Medicine

## 2022-05-07 ENCOUNTER — Other Ambulatory Visit: Payer: Self-pay

## 2022-05-07 ENCOUNTER — Encounter: Payer: Self-pay | Admitting: Emergency Medicine

## 2022-05-07 ENCOUNTER — Ambulatory Visit
Admission: EM | Admit: 2022-05-07 | Discharge: 2022-05-07 | Disposition: A | Discharge status: Home / Self Care | Payer: Medicare Other | Attending: Emergency Medicine | Admitting: Emergency Medicine

## 2022-05-07 DIAGNOSIS — J988 Other specified respiratory disorders: Secondary | ICD-10-CM

## 2022-05-07 DIAGNOSIS — B9789 Other viral agents as the cause of diseases classified elsewhere: Secondary | ICD-10-CM | POA: Diagnosis present

## 2022-05-07 DIAGNOSIS — U071 COVID-19: Secondary | ICD-10-CM | POA: Diagnosis not present

## 2022-05-07 LAB — RESP PANEL BY RT-PCR (FLU A&B, COVID) ARPGX2
Influenza A by PCR: NEGATIVE
Influenza B by PCR: NEGATIVE
SARS Coronavirus 2 by RT PCR: POSITIVE — AB

## 2022-05-07 NOTE — ED Provider Notes (Signed)
[facility]    CSN: [id number] Arrival date & time: 05/07/22  1452    HISTORY   Chief Complaint  Patient presents with   URI   HPI Kimberly Harper is a pleasant, 36 y.o. female who presents to urgent care today. Pt complains of URI sx x 4 days; pt sts was at a work conference prior to this where people were coughing denies known exposure to COVID-19 or influenza.  Patient has mildly elevated pressure on arrival, otherwise normal vital signs.  The history is provided by the patient.  URI Presenting symptoms: congestion, cough, fatigue and rhinorrhea   Presenting symptoms: no ear pain, no facial pain, no fever and no sore throat   Congestion:    Location:  Nasal   Interferes with sleep: no     Interferes with eating/drinking: no   Cough:    Cough characteristics:  Non-productive   Severity:  Mild   Onset quality:  Gradual   Duration:  4 days   Timing:  Intermittent   Progression:  Waxing and waning Fatigue:    Severity:  Mild   Timing:  Intermittent   Progression:  Waxing and waning Severity:  Mild Timing:  Intermittent Progression:  Waxing and waning Chronicity:  New Relieved by:  None tried Worsened by:  Nothing Ineffective treatments:  None tried Associated symptoms: no arthralgias, no headaches, no myalgias, no neck pain, no sinus pain, no sneezing, no swollen glands and no wheezing   Risk factors: recent travel and sick contacts    Past Medical History:  Diagnosis Date   Alcoholic (HCC)    Anemia    Phreesia 01/17/2020   Anxiety    Asthma    Asthma 06/22/2021   Bipolar 1 disorder (HCC)    Blood transfusion without reported diagnosis    Phreesia 01/17/2020   Chest pain 03/01/2022   03/2021 xr negative.   Chronic headaches    Depression    Phreesia 01/17/2020   Lactose intolerance 03/01/2022   Major depressive disorder    Nausea 03/01/2022   Pregnancy, location unknown 11/21/2021   Formatting of this note might be different from the  original. [facility] Quant Book Jalisia Sonier Language: English There is no such number on file (mobile). [phone] (home) [address] [place] [address] No e-mail address on record  [facility] [place] (7th floor [place], [address]): M-F 7:30-18:00 [facility]: M-F 8:00-17:30 [facility]: M-F 8:30-17:00 (cl   Substance abuse (HCC)    Phreesia 01/17/2020   Patient Active Problem List   Diagnosis Date Noted   Abnormal uterine bleeding (AUB) 03/13/2022   Nausea 03/01/2022   Chest pain 03/01/2022   Lactose intolerance 03/01/2022   Rash 03/01/2022   Epigastric pain 02/08/2022   Other allergic rhinitis 06/22/2021   Heartburn 06/22/2021   Panic disorder 04/12/2020   GAD (generalized anxiety disorder) 03/18/2020   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   Borderline personality disorder (HCC) 02/06/2020   Bipolar 2 disorder, major depressive episode (HCC) 12/06/2019   Past Surgical History:  Procedure Laterality Date   CESAREAN SECTION N/A    Phreesia 01/17/2020   COLONOSCOPY     earlier in year 2022   DILATION AND CURETTAGE OF UTERUS     HAND SURGERY Right    WISDOM TOOTH EXTRACTION     OB History     Gravida  4   Para  1   Term      Preterm  1   AB  3   Living  1      SAB  3   IAB      Ectopic      Multiple      Live Births  1          Home Medications    Prior to Admission medications   Medication Sig Start Date End Date Taking? Authorizing Provider  ALPRAZolam Duanne Moron) 1 MG tablet Take 1 tablet (1 mg total) by mouth 2 (two) times daily as needed for anxiety. 02/21/22   Maximiano Coss, NP  Capsaicin 0.033 % CREA Apply 1 Application topically daily. 02/08/22   Maximiano Coss, NP  cholecalciferol (VITAMIN D3) 25 MCG (1000 UNIT) tablet Take 1,000 Units by mouth daily.    [provider]  levonorgestrel-ethinyl estradiol (ALESSE) 0.1-20 MG-MCG tablet Take 1 tablet by mouth daily. 03/13/22   Leftwich-Kirby, Kathie Dike, CNM   MACA ROOT PO Take by mouth.    [provider]  omeprazole (PRILOSEC) 40 MG capsule  11/04/21   [provider]  ondansetron (ZOFRAN) 4 MG tablet Take 1 tablet (4 mg total) by mouth every 8 (eight) hours as needed for nausea or vomiting. 03/01/22   Loralee Pacas, MD  pramipexole (MIRAPEX) 0.25 MG tablet TAKE 1 TABLET(0.25 MG) BY MOUTH THREE TIMES DAILY 04/24/22   Loralee Pacas, MD  propranolol (INDERAL) 20 MG tablet TAKE 1 TABLET(20 MG) BY MOUTH DAILY 12/04/21   Maximiano Coss, NP  sertraline (ZOLOFT) 100 MG tablet Take 0.5 tablets (50 mg total) by mouth daily for 14 days, THEN 1 tablet (100 mg total) daily. 11/16/21 02/14/22  Maximiano Coss, NP    Family History Family History  Problem Relation Age of Onset   Hypertension Mother    Diabetes Mother    Stroke Mother    Depression Mother    Depression Sister    Irritable bowel syndrome Sister    Asthma Brother    Allergic rhinitis Brother    Migraines Brother    Depression Brother    Stroke Maternal Grandmother    Diabetes Maternal Grandmother    Alcohol abuse Maternal Grandfather    Stroke Paternal Grandmother    Breast cancer Paternal Grandmother    Alcohol abuse Paternal Grandfather    Lupus Half-Sister    Angioedema Neg Hx    Eczema Neg Hx    Immunodeficiency Neg Hx    Atopy Neg Hx    Social History Social History   Tobacco Use   Smoking status: Former    Types: Cigarettes    Quit date: 09/07/2006    Years since quitting: 15.6   Smokeless tobacco: Never  Vaping Use   Vaping Use: Never used  Substance Use Topics   Alcohol use: Yes    Comment: now soically   Drug use: Not Currently    Types: Marijuana   Allergies   Prednisone and Multiple vitamins-iron  Review of Systems Review of Systems  Constitutional:  Positive for fatigue. Negative for fever.  HENT:  Positive for congestion and rhinorrhea. Negative for ear pain, sinus pain, sneezing and sore throat.   Respiratory:  Positive for cough.  Negative for wheezing.   Musculoskeletal:  Negative for arthralgias, myalgias and neck pain.  Neurological:  Negative for headaches.   Pertinent findings revealed after performing a 14 point review of systems has been noted in the history of present illness.  Physical Exam Triage Vital Signs ED Triage Vitals  Enc Vitals Group  BP 05/20/21 0827 (!) 147/82     Pulse Rate 05/20/21 0827 72     Resp 05/20/21 0827 18     Temp 05/20/21 0827 98.3 F (36.8 C)     Temp Source 05/20/21 0827 Oral     SpO2 05/20/21 0827 98 %     Weight --      Height --      Head Circumference --      Peak Flow --      Pain Score 05/20/21 0826 5     Pain Loc --      Pain Edu? --      Excl. in GC? --   No data found.  Updated Vital Signs BP (!) 132/91 (BP Location: Left Arm)   Pulse 85   Temp 98.5 F (36.9 C) (Oral)   Resp 18   SpO2 100%   Physical Exam Vitals and nursing note reviewed.  Constitutional:      General: She is not in acute distress.    Appearance: Normal appearance. She is not ill-appearing.  HENT:     Head: Normocephalic and atraumatic.     Salivary Glands: Right salivary gland is not diffusely enlarged or tender. Left salivary gland is not diffusely enlarged or tender.     Right Ear: Tympanic membrane, ear canal and external ear normal. No drainage. No middle ear effusion. There is no impacted cerumen. Tympanic membrane is not erythematous or bulging.     Left Ear: Tympanic membrane, ear canal and external ear normal. No drainage.  No middle ear effusion. There is no impacted cerumen. Tympanic membrane is not erythematous or bulging.     Nose: Nose normal. No nasal deformity, septal deviation, mucosal edema, congestion or rhinorrhea.     Right Turbinates: Not enlarged, swollen or pale.     Left Turbinates: Not enlarged, swollen or pale.     Right Sinus: No maxillary sinus tenderness or frontal sinus tenderness.     Left Sinus: No maxillary sinus tenderness or frontal sinus  tenderness.     Mouth/Throat:     Lips: Pink. No lesions.     Mouth: Mucous membranes are moist. No oral lesions.     Pharynx: Oropharynx is clear. Uvula midline. No posterior oropharyngeal erythema or uvula swelling.     Tonsils: No tonsillar exudate. 0 on the right. 0 on the left.  Eyes:     General: Lids are normal.        Right eye: No discharge.        Left eye: No discharge.     Extraocular Movements: Extraocular movements intact.     Conjunctiva/sclera: Conjunctivae normal.     Right eye: Right conjunctiva is not injected.     Left eye: Left conjunctiva is not injected.  Neck:     Trachea: Trachea and phonation normal.  Cardiovascular:     Rate and Rhythm: Normal rate and regular rhythm.     Pulses: Normal pulses.     Heart sounds: Normal heart sounds. No murmur heard.    No friction rub. No gallop.  Pulmonary:     Effort: Pulmonary effort is normal. No accessory muscle usage, prolonged expiration or respiratory distress.     Breath sounds: Normal breath sounds. No stridor, decreased air movement or transmitted upper airway sounds. No decreased breath sounds, wheezing, rhonchi or rales.  Chest:     Chest wall: No tenderness.  Musculoskeletal:        General: Normal range of  motion.     Cervical back: Normal range of motion and neck supple. Normal range of motion.  Lymphadenopathy:     Cervical: No cervical adenopathy.  Skin:    General: Skin is warm and dry.     Findings: No erythema or rash.  Neurological:     General: No focal deficit present.     Mental Status: She is alert and oriented to person, place, and time.  Psychiatric:        Mood and Affect: Mood normal.        Behavior: Behavior normal.     Visual Acuity Right Eye Distance:   Left Eye Distance:   Bilateral Distance:    Right Eye Near:   Left Eye Near:    Bilateral Near:     [facility] Couse / Diagnostics / Procedures:     Radiology No results found.  Procedures Procedures (including critical  care time) EKG  Pending results:  Labs Reviewed  RESP PANEL BY RT-PCR (FLU A&B, COVID) ARPGX2 - Abnormal; Notable for the following components:      Result Value   SARS Coronavirus 2 by RT PCR POSITIVE (*)    All other components within normal limits    Medications Ordered in UC: Medications - No data to display  UC Diagnoses / Final Clinical Impressions(s)   I have reviewed the triage vital signs and the nursing notes.  Pertinent labs & imaging results that were available during my care of the patient were reviewed by me and considered in my medical decision making (see chart for details).    Final diagnoses:  Viral respiratory infection   Physical exam today is unremarkable.  Patient provided with a COVID-19 test.  Will notify patient of results once received.  Conservative care recommended.  Patient does not have comorbidities increasing her risk of hospitalization due to COVID-19 and therefore would not qualify for Paxlovid.  ED Prescriptions   None    PDMP not reviewed this encounter.  Disposition Upon Discharge:  Condition: stable for discharge home Home: take medications as prescribed; routine discharge instructions as discussed; follow up as advised.  Patient presented with an acute illness with associated systemic symptoms and significant discomfort requiring urgent management. In my opinion, this is a condition that a prudent lay person (someone who possesses an average knowledge of health and medicine) may potentially expect to result in complications if not addressed urgently such as respiratory distress, impairment of bodily function or dysfunction of bodily organs.   Routine symptom specific, illness specific and/or disease specific instructions were discussed with the patient and/or caregiver at length.   As such, the patient has been evaluated and assessed, work-up was performed and treatment was provided in alignment with urgent care protocols and evidence based  medicine.  Patient/parent/caregiver has been advised that the patient may require follow up for further testing and treatment if the symptoms continue in spite of treatment, as clinically indicated and appropriate.  If the patient was tested for COVID-19, Influenza and/or RSV, then the patient/parent/guardian was advised to isolate at home pending the results of his/her diagnostic coronavirus test and potentially longer if they're positive. I have also advised pt that if his/her COVID-19 test returns positive, it's recommended to self-isolate for at least 10 days after symptoms first appeared AND until fever-free for 24 hours without fever reducer AND other symptoms have improved or resolved. Discussed self-isolation recommendations as well as instructions for household member/close contacts as per the CDC and [place] DHHS,  and also gave patient the COVID packet with this information.  Patient/parent/caregiver has been advised [facility] in 3-5 days if no better; to PCP or the Emergency Department if new signs and symptoms develop, or if the current signs or symptoms continue to change or worsen for further workup, evaluation and treatment as clinically indicated and appropriate  The patient will follow up with their current PCP if and as advised. If the patient does not currently have a PCP we will assist them in obtaining one.   The patient may need specialty follow up if the symptoms continue, in spite of conservative treatment and management, for further workup, evaluation, consultation and treatment as clinically indicated and appropriate.  Patient/parent/caregiver verbalized understanding and agreement of plan as discussed.  All questions were addressed during visit.  Please see discharge instructions below for further details of plan.  Discharge Instructions:   Discharge Instructions      You received a COVID-19 and influenza PCR today.  The results of your PCR testing will be  posted to your MyChart once it is complete.  This typically takes 6 to 12 hours.    If your COVID-19 PCR test is positive, you will be contacted by phone.  Because you do not have a history of being immune compromised, you are currently vaccinated for COVID-19, you are under the age of 38, you do not have a risk of severe disease due to COVID-19, antiviral treatment is not indicated.     If your influenza PCR test is positive, you will be contacted by phone.  Due to the current duration of your symptoms, you will no longer benefit from antiviral therapy for influenza.       If both your COVID-19 and influenza PCR tests are negative, then you can safely assume that your illness is due to one of the many less serious illnesses circulating in our community right now.  Conservative care is recommended with rest, drinking plenty of clear fluids, eating only when hungry, taking supportive medications for your symptoms and avoiding being around other people.  Please remain at home until you are fever free for 24 hours without the use of antifever medications such as Tylenol and ibuprofen.     Based on my physical exam findings and the history you have provided  today, I do not recommend antibiotics at this time.  I do not believe the risks and side effects of antibiotics would outweigh any minimal benefit that they might provide.         Please see below for list of medications that I recommend to treat and alleviate your current symptoms:   Advil, Motrin (ibuprofen): This is a good anti-inflammatory medication which addresses aches, pains and inflammation of the upper airways that causes sinus and nasal congestion as well as in the lower airways which makes your cough feel tight and sometimes burn.  I recommend that you take between 400 to 600 mg every 6-8 hours as needed.      Tylenol (acetaminophen): This is a good fever reducer.  If your body temperature rises above 101.5 as measured with a thermometer,  it is recommended that you take 1,000 mg every 8 hours until your temperature falls below 101.5, please not take more than 3,000 mg of acetaminophen either as a separate medication or as in ingredient in an over-the-counter cold/flu preparation within a 24-hour period.      Robitussin, Mucinex (guaifenesin): This is an expectorant.  This helps break up chest congestion and loosen up thick nasal drainage making phlegm and drainage more liquid and therefore easier to remove.  I recommend being 400 mg three times daily as needed.      Please follow-up within the next 5-7 days either with your primary care provider or urgent care if your symptoms do not resolve.  If you do not have a primary care provider, we will assist you in finding one.        Thank you for visiting urgent care today.  We appreciate the opportunity to participate in your care.         This office note has been dictated using Teaching laboratory technician.  Unfortunately, this method of dictation can sometimes lead to typographical or grammatical errors.  I apologize for your inconvenience in advance if this occurs.  Please do not hesitate to reach out to me if clarification is needed.      Theadora Rama Scales, [place] 05/08/22 [phone]

## 2022-05-07 NOTE — Discharge Instructions (Signed)
You received a COVID-19 and influenza PCR today.  The results of your PCR testing will be posted to your MyChart once it is complete.  This typically takes 6 to 12 hours.    If your COVID-19 PCR test is positive, you will be contacted by phone.  Because you do not have a history of being immune compromised, you are currently vaccinated for COVID-19, you are under the age of 2, you do not have a risk of severe disease due to COVID-19, antiviral treatment is not indicated.     If your influenza PCR test is positive, you will be contacted by phone.  Due to the current duration of your symptoms, you will no longer benefit from antiviral therapy for influenza.       If both your COVID-19 and influenza PCR tests are negative, then you can safely assume that your illness is due to one of the many less serious illnesses circulating in our community right now.  Conservative care is recommended with rest, drinking plenty of clear fluids, eating only when hungry, taking supportive medications for your symptoms and avoiding being around other people.  Please remain at home until you are fever free for 24 hours without the use of antifever medications such as Tylenol and ibuprofen.     Based on my physical exam findings and the history you have provided  today, I do not recommend antibiotics at this time.  I do not believe the risks and side effects of antibiotics would outweigh any minimal benefit that they might provide.         Please see below for list of medications that I recommend to treat and alleviate your current symptoms:   Advil, Motrin (ibuprofen): This is a good anti-inflammatory medication which addresses aches, pains and inflammation of the upper airways that causes sinus and nasal congestion as well as in the lower airways which makes your cough feel tight and sometimes burn.  I recommend that you take between 400 to 600 mg every 6-8 hours as needed.      Tylenol (acetaminophen): This is a good  fever reducer.  If your body temperature rises above 101.5 as measured with a thermometer, it is recommended that you take 1,000 mg every 8 hours until your temperature falls below 101.5, please not take more than 3,000 mg of acetaminophen either as a separate medication or as in ingredient in an over-the-counter cold/flu preparation within a 24-hour period.      Robitussin, Mucinex (guaifenesin): This is an expectorant.  This helps break up chest congestion and loosen up thick nasal drainage making phlegm and drainage more liquid and therefore easier to remove.  I recommend being 400 mg three times daily as needed.      Please follow-up within the next 5-7 days either with your primary care provider or urgent care if your symptoms do not resolve.  If you do not have a primary care provider, we will assist you in finding one.        Thank you for visiting urgent care today.  We appreciate the opportunity to participate in your care.

## 2022-05-07 NOTE — ED Triage Notes (Signed)
Pt sts URI sx x 4 days; pt sts was at a work Hospital doctor and people were coughing

## 2022-05-18 ENCOUNTER — Ambulatory Visit
Admission: RE | Admit: 2022-05-18 | Discharge: 2022-05-18 | Disposition: A | Discharge status: Home / Self Care | Payer: Medicare Other | Source: Ambulatory Visit | Attending: Emergency Medicine | Admitting: Emergency Medicine

## 2022-05-18 VITALS — BP 107/71 | HR 70 | Temp 98.3°F | Resp 16

## 2022-05-18 DIAGNOSIS — Z3202 Encounter for pregnancy test, result negative: Secondary | ICD-10-CM

## 2022-05-18 DIAGNOSIS — L2089 Other atopic dermatitis: Secondary | ICD-10-CM | POA: Diagnosis not present

## 2022-05-18 LAB — POCT URINALYSIS DIP (MANUAL ENTRY)
Bilirubin, UA: NEGATIVE
Blood, UA: NEGATIVE
Glucose, UA: NEGATIVE mg/dL
Ketones, POC UA: NEGATIVE mg/dL
Leukocytes, UA: NEGATIVE
Nitrite, UA: NEGATIVE
Protein Ur, POC: NEGATIVE mg/dL
Spec Grav, UA: 1.02 (ref 1.010–1.025)
Urobilinogen, UA: 0.2 E.U./dL
pH, UA: 6.5 (ref 5.0–8.0)

## 2022-05-18 LAB — POCT URINE PREGNANCY: Preg Test, Ur: NEGATIVE

## 2022-05-18 MED ORDER — FLUCONAZOLE 150 MG PO TABS: ORAL_TABLET | ORAL | 0 refills | Status: DC

## 2022-05-18 MED ORDER — TRIAMCINOLONE ACETONIDE 0.1 % EX OINT: 1.0000 | TOPICAL_OINTMENT | Freq: Two times a day (BID) | CUTANEOUS | 1 refills | Status: DC

## 2022-05-18 NOTE — ED Provider Notes (Addendum)
[facility]    CSN: [id number] Arrival date & time: 05/18/22  1701    HISTORY   Chief Complaint  Patient presents with   Vaginal Itching   HPI Kimberly Harper is a pleasant, 36 y.o. female who presents to urgent care today. Patient complains of irritation in her perineal area for the past week.  States she has always been very sensitive in that area and has never been able to use scented soaps, fabric softener or bleach. States it is itchy and painful when she scratches. Bumps appear when scratched but they fade after she stops scratching.  Patient states she attempted to apply hydrocortisone cream to the area but this did not help.  Patient states has not tried anything else.  Patient denies burning with urination, abnormal vaginal discharge, vaginal itching, vaginal irritation, and genital lesion(s).     The history is provided by the patient.   Past Medical History:  Diagnosis Date   Alcoholic ([place])    Anemia    Phreesia 01/17/2020   Anxiety    Asthma    Asthma 06/22/2021   Bipolar 1 disorder ([place])    Blood transfusion without reported diagnosis    Phreesia 01/17/2020   Chest pain 03/01/2022   03/2021 xr negative.   Chronic headaches    Depression    Phreesia 01/17/2020   Lactose intolerance 03/01/2022   Major depressive disorder    Nausea 03/01/2022   Pregnancy, location unknown 11/21/2021   Formatting of this note might be different from the original. [place] Language: English There is no such number on file (mobile). [phone] (home) [place] [address] No e-mail address on record  Lab Hours [place] (7th floor [place], [address]): M-F 7:30-18:00 [facility]: M-F 8:00-17:30 [facility]: M-F 8:30-17:00 (cl   Substance abuse ([place])    Phreesia 01/17/2020   Patient Active Problem List   Diagnosis Date Noted   Abnormal uterine bleeding (AUB) 03/13/2022   Nausea 03/01/2022   Chest  pain 03/01/2022   Lactose intolerance 03/01/2022   Rash 03/01/2022   Epigastric pain 02/08/2022   Other allergic rhinitis 06/22/2021   Heartburn 06/22/2021   Panic disorder 04/12/2020   GAD (generalized anxiety disorder) 03/18/2020   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   Borderline personality disorder ([place]) 02/06/2020   Bipolar 2 disorder, major depressive episode ([place]) 12/06/2019   Past Surgical History:  Procedure Laterality Date   CESAREAN SECTION N/A    Phreesia 01/17/2020   COLONOSCOPY     earlier in year 2022   DILATION AND CURETTAGE OF UTERUS     HAND SURGERY Right    WISDOM TOOTH EXTRACTION     OB History     Gravida  4   Para  1   Term      Preterm  1   AB  3   Living  1      SAB  3   IAB      Ectopic      Multiple      Live Births  1          Home Medications    Prior to Admission medications   Medication Sig Start Date End Date Taking? Authorizing Provider  ALPRAZolam Duanne Moron) 1 MG tablet Take 1 tablet (1 mg total) by mouth 2 (two) times daily as needed for anxiety. 02/21/22   Maximiano Coss, NP  Capsaicin 0.033 % CREA  Apply 1 Application topically daily. 02/08/22   Maximiano Coss, NP  cholecalciferol (VITAMIN D3) 25 MCG (1000 UNIT) tablet Take 1,000 Units by mouth daily.    [provider]  levonorgestrel-ethinyl estradiol (ALESSE) 0.1-20 MG-MCG tablet Take 1 tablet by mouth daily. 03/13/22   Leftwich-Kirby, Kathie Dike, CNM  MACA ROOT PO Take by mouth.    [provider]  omeprazole (PRILOSEC) 40 MG capsule  11/04/21   [provider]  ondansetron (ZOFRAN) 4 MG tablet Take 1 tablet (4 mg total) by mouth every 8 (eight) hours as needed for nausea or vomiting. 03/01/22   Loralee Pacas, MD  pramipexole (MIRAPEX) 0.25 MG tablet TAKE 1 TABLET(0.25 MG) BY MOUTH THREE TIMES DAILY 04/24/22   Loralee Pacas, MD  propranolol (INDERAL) 20 MG tablet TAKE 1 TABLET(20 MG) BY MOUTH DAILY 12/04/21   Maximiano Coss, NP  sertraline (ZOLOFT)  100 MG tablet Take 0.5 tablets (50 mg total) by mouth daily for 14 days, THEN 1 tablet (100 mg total) daily. 11/16/21 02/14/22  Maximiano Coss, NP    Family History Family History  Problem Relation Age of Onset   Hypertension Mother    Diabetes Mother    Stroke Mother    Depression Mother    Depression Sister    Irritable bowel syndrome Sister    Asthma Brother    Allergic rhinitis Brother    Migraines Brother    Depression Brother    Stroke Maternal Grandmother    Diabetes Maternal Grandmother    Alcohol abuse Maternal Grandfather    Stroke Paternal Grandmother    Breast cancer Paternal Grandmother    Alcohol abuse Paternal Grandfather    Lupus Half-Sister    Angioedema Neg Hx    Eczema Neg Hx    Immunodeficiency Neg Hx    Atopy Neg Hx    Social History Social History   Tobacco Use   Smoking status: Former    Types: Cigarettes    Quit date: 09/07/2006    Years since quitting: 15.7   Smokeless tobacco: Never  Vaping Use   Vaping Use: Never used  Substance Use Topics   Alcohol use: Yes    Comment: now soically   Drug use: Not Currently    Types: Marijuana   Allergies   Multiple vitamins-iron and Prednisone  Review of Systems Review of Systems Pertinent findings revealed after performing a 14 point review of systems has been noted in the history of present illness.  Physical Exam Triage Vital Signs ED Triage Vitals  Enc Vitals Group     BP 05/20/21 0827 (!) 147/82     Pulse Rate 05/20/21 0827 72     Resp 05/20/21 0827 18     Temp 05/20/21 0827 98.3 F (36.8 C)     Temp Source 05/20/21 0827 Oral     SpO2 05/20/21 0827 98 %     Weight --      Height --      Head Circumference --      Peak Flow --      Pain Score 05/20/21 0826 5     Pain Loc --      Pain Edu? --      Excl. in [place]? --   No data found.  Updated Vital Signs BP 107/71 (BP Location: Right Arm)   Pulse 70   Temp 98.3 F (36.8 C) (Oral)   Resp 16   LMP 05/05/2022 (Approximate)   SpO2  98%   Physical  Exam Vitals and nursing note reviewed.  Constitutional:      General: She is not in acute distress.    Appearance: Normal appearance.  HENT:     Head: Normocephalic and atraumatic.  Eyes:     Pupils: Pupils are equal, round, and reactive to light.  Cardiovascular:     Rate and Rhythm: Normal rate and regular rhythm.  Pulmonary:     Effort: Pulmonary effort is normal.     Breath sounds: Normal breath sounds.  Genitourinary:    Pubic Area: Rash present.       Comments: Scant mild scale without erythema, no sign of excoriation or rash otherwise Musculoskeletal:        General: Normal range of motion.     Cervical back: Normal range of motion and neck supple.  Skin:    General: Skin is warm and dry.  Neurological:     General: No focal deficit present.     Mental Status: She is alert and oriented to person, place, and time. Mental status is at baseline.  Psychiatric:        Mood and Affect: Mood normal.        Behavior: Behavior normal.        Thought Content: Thought content normal.        Judgment: Judgment normal.     Visual Acuity Right Eye Distance:   Left Eye Distance:   Bilateral Distance:    Right Eye Near:   Left Eye Near:    Bilateral Near:     [facility] / Diagnostics / Procedures:     Radiology No results found.  Procedures Procedures (including critical care time) EKG  Pending results:  Labs Reviewed  POCT URINALYSIS DIP (MANUAL ENTRY) - Abnormal; Notable for the following components:      Result Value   Color, UA light yellow (*)    Clarity, UA cloudy (*)    All other components within normal limits  POCT URINE PREGNANCY  CERVICOVAGINAL ANCILLARY ONLY    Medications Ordered in UC: Medications - No data to display  UC Diagnoses / Final Clinical Impressions(s)   I have reviewed the triage vital signs and the nursing notes.  Pertinent labs & imaging results that were available during my care of the patient were reviewed by  me and considered in my medical decision making (see chart for details).    Final diagnoses:  Flexural atopic dermatitis   Patient requesting stronger topical steroid and Diflucan for possible vaginal yeast infection.  Patient advised that vaginal swab obtained will test for yeast but because topical steroid applied in her vaginal area will likely cause her to develop a yeast infection, will go ahead and prescribe her Diflucan now.  Urine pregnancy test was negative.  Kenalog 0.1% prescribed to use twice daily as needed and to discontinue if discoloration occurs.  Return precautions advised.  ED Prescriptions     Medication Sig Dispense Auth. Provider   triamcinolone ointment (KENALOG) 0.1 % Apply 1 Application topically 2 (two) times daily. Apply to affected area(s) twice daily , do not apply to face. 30 g Theadora Rama Scales, PA-C   fluconazole (DIFLUCAN) 150 MG tablet Take 1 tablet today.  Take second tablet 3 days later. 2 tablet Theadora Rama Scales, PA-C      PDMP not reviewed this encounter.  Disposition Upon Discharge:  Condition: stable for discharge home  Patient presented with concern for an acute illness with associated systemic symptoms and significant discomfort  requiring urgent management. In my opinion, this is a condition that a prudent lay person (someone who possesses an average knowledge of health and medicine) may potentially expect to result in complications if not addressed urgently such as respiratory distress, impairment of bodily function or dysfunction of bodily organs.   As such, the patient has been evaluated and assessed, work-up was performed and treatment was provided in alignment with urgent care protocols and evidence based medicine.  Patient/parent/caregiver has been advised that the patient may require follow up for further testing and/or treatment if the symptoms continue in spite of treatment, as clinically indicated and appropriate.  Routine symptom  specific, illness specific and/or disease specific instructions were discussed with the patient and/or caregiver at length.  Prevention strategies for avoiding STD exposure were also discussed.  The patient will follow up with their current PCP if and as advised. If the patient does not currently have a PCP we will assist them in obtaining one.   The patient may need specialty follow up if the symptoms continue, in spite of conservative treatment and management, for further workup, evaluation, consultation and treatment as clinically indicated and appropriate.  Patient/parent/caregiver verbalized understanding and agreement of plan as discussed.  All questions were addressed during visit.  Please see discharge instructions below for further details of plan.  Discharge Instructions:   Discharge Instructions      I sent a prescription for triamcinolone ointment to your pharmacy.  You can apply this twice daily to the affected areas.  If you develop a yeast infection after using steroids in your perineal area, you have a prescription for Diflucan at your pharmacy as well.  Please take the first tablet on the first day of onset of symptoms and take the second tablet 3 days later.  Please follow-up with either your primary care provider or a dermatologist if this does not not resolve your issue.  Your urinalysis today was normal.  The result of your vaginal swab test which will look for BV and yeast as possible causes of the skin irritation that you are experiencing at this time will be posted to your MyChart account once complete in the next 2 to 3 days.  Thank you for visiting urgent care today.      This office note has been dictated using Museum/gallery curator.  Unfortunately, this method of dictation can sometimes lead to typographical or grammatical errors.  I apologize for your inconvenience in advance if this occurs.  Please do not hesitate to reach out to me if  clarification is needed.       Lynden Oxford Scales, PA-C 05/18/22 1858    Lynden Oxford Scales, PA-C 05/24/22 1210

## 2022-05-18 NOTE — Discharge Instructions (Addendum)
I sent a prescription for triamcinolone ointment to your pharmacy.  You can apply this twice daily to the affected areas.  If you develop a yeast infection after using steroids in your perineal area, you have a prescription for Diflucan at your pharmacy as well.  Please take the first tablet on the first day of onset of symptoms and take the second tablet 3 days later.  Please follow-up with either your primary care provider or a dermatologist if this does not not resolve your issue.  Your urinalysis today was normal.  The result of your vaginal swab test which will look for BV and yeast as possible causes of the skin irritation that you are experiencing at this time will be posted to your MyChart account once complete in the next 2 to 3 days.  Thank you for visiting urgent care today.

## 2022-05-18 NOTE — ED Triage Notes (Signed)
Patient with irritation to vagina. States it is a very sensitive area and has never been able to use scented soaps, fabric softener or bleach. States it is itchy and painful. Bumps appear when scratched.

## 2022-05-19 LAB — CERVICOVAGINAL ANCILLARY ONLY
Bacterial Vaginitis (gardnerella): NEGATIVE
Candida Glabrata: NEGATIVE
Candida Vaginitis: POSITIVE — AB
Chlamydia: NEGATIVE
Comment: NEGATIVE
Comment: NEGATIVE
Comment: NEGATIVE
Comment: NEGATIVE
Comment: NEGATIVE
Comment: NORMAL
Neisseria Gonorrhea: NEGATIVE
Trichomonas: NEGATIVE

## 2022-06-08 ENCOUNTER — Ambulatory Visit (INDEPENDENT_AMBULATORY_CARE_PROVIDER_SITE_OTHER): Payer: Medicare Other

## 2022-06-08 ENCOUNTER — Ambulatory Visit
Admission: EM | Admit: 2022-06-08 | Discharge: 2022-06-08 | Disposition: A | Discharge status: Home / Self Care | Payer: Medicare Other

## 2022-06-08 DIAGNOSIS — R0982 Postnasal drip: Secondary | ICD-10-CM

## 2022-06-08 DIAGNOSIS — R059 Cough, unspecified: Secondary | ICD-10-CM | POA: Diagnosis not present

## 2022-06-08 DIAGNOSIS — R053 Chronic cough: Secondary | ICD-10-CM

## 2022-06-08 DIAGNOSIS — R0602 Shortness of breath: Secondary | ICD-10-CM

## 2022-06-08 DIAGNOSIS — J4520 Mild intermittent asthma, uncomplicated: Secondary | ICD-10-CM

## 2022-06-08 DIAGNOSIS — J309 Allergic rhinitis, unspecified: Secondary | ICD-10-CM | POA: Diagnosis not present

## 2022-06-08 MED ORDER — ALBUTEROL SULFATE HFA 108 (90 BASE) MCG/ACT IN AERS: 1.0000 | INHALATION_SPRAY | Freq: Four times a day (QID) | RESPIRATORY_TRACT | 0 refills | Status: AC | PRN

## 2022-06-08 MED ORDER — PROMETHAZINE-DM 6.25-15 MG/5ML PO SYRP: 2.5000 mL | ORAL_SOLUTION | Freq: Three times a day (TID) | ORAL | 0 refills | Status: DC | PRN

## 2022-06-08 MED ORDER — PSEUDOEPHEDRINE HCL 60 MG PO TABS: 60.0000 mg | ORAL_TABLET | Freq: Three times a day (TID) | ORAL | 0 refills | Status: DC | PRN

## 2022-06-08 MED ORDER — CETIRIZINE HCL 10 MG PO TABS: 10.0000 mg | ORAL_TABLET | Freq: Every day | ORAL | 0 refills | Status: DC

## 2022-06-08 NOTE — ED Triage Notes (Signed)
Pt presents with c/o cough and sob x2-3 weeks

## 2022-06-08 NOTE — ED Provider Notes (Signed)
[facility]  Note:  This document was prepared using Conservation officer, historic buildings and may include unintentional dictation errors.  MRN: [id number] DOB: 05/02/1986  Subjective:   Kimberly Harper is a 36 y.o. female presenting for 2 to 3-week history of persistent coughing, shortness of breath, chest tightness.  She is also had some slight drainage.  No fever, overt chest pain, throat pain, ear pain, sinus pain.  Patient has a history of asthma but this is remote in nature.  Has not needed an inhaler for years.  She did have COVID-19 infection in mid October.  Opted to not take any COVID antivirals.  She does not smoke any marijuana, no vaping, no smoking.  No current facility-administered medications for this encounter.  Current Outpatient Medications:    pantoprazole (PROTONIX) 20 MG tablet, Take 20 mg by mouth daily., Disp: , Rfl:    ALPRAZolam (XANAX) 1 MG tablet, Take 1 tablet (1 mg total) by mouth 2 (two) times daily as needed for anxiety., Disp: 30 tablet, Rfl: 0   Capsaicin 0.033 % CREA, Apply 1 Application topically daily. (Patient not taking: Reported on 06/08/2022), Disp: 56.6 g, Rfl: 0   cholecalciferol (VITAMIN D3) 25 MCG (1000 UNIT) tablet, Take 1,000 Units by mouth daily., Disp: , Rfl:    fluconazole (DIFLUCAN) 150 MG tablet, Take 1 tablet today.  Take second tablet 3 days later. (Patient not taking: Reported on 06/08/2022), Disp: 2 tablet, Rfl: 0   levonorgestrel-ethinyl estradiol (ALESSE) 0.1-20 MG-MCG tablet, Take 1 tablet by mouth daily. (Patient not taking: Reported on 06/08/2022), Disp: 28 tablet, Rfl: 11   MACA ROOT PO, Take by mouth. (Patient not taking: Reported on 06/08/2022), Disp: , Rfl:    omeprazole (PRILOSEC) 40 MG capsule, , Disp: , Rfl:    ondansetron (ZOFRAN) 4 MG tablet, Take 1 tablet (4 mg total) by mouth every 8 (eight) hours as needed for nausea or vomiting. (Patient not taking: Reported on 06/08/2022), Disp: 90 tablet, Rfl:  5   pramipexole (MIRAPEX) 0.25 MG tablet, TAKE 1 TABLET(0.25 MG) BY MOUTH THREE TIMES DAILY, Disp: 270 tablet, Rfl: 0   propranolol (INDERAL) 20 MG tablet, TAKE 1 TABLET(20 MG) BY MOUTH DAILY, Disp: 90 tablet, Rfl: 3   sertraline (ZOLOFT) 100 MG tablet, Take 0.5 tablets (50 mg total) by mouth daily for 14 days, THEN 1 tablet (100 mg total) daily. (Patient not taking: Reported on 06/08/2022), Disp: 83 tablet, Rfl: 0   triamcinolone ointment (KENALOG) 0.1 %, Apply 1 Application topically 2 (two) times daily. Apply to affected area(s) twice daily , do not apply to face., Disp: 30 g, Rfl: 1   Allergies  Allergen Reactions   Multiple Vitamins-Iron Swelling    States that vitamins, folic acid and iron have caused swelling of tongue and gums   Prednisone Other (See Comments)    Altered mental status    Past Medical History:  Diagnosis Date   Alcoholic ([facility])    Anemia    Phreesia 01/17/2020   Anxiety    Asthma    Asthma 06/22/2021   Bipolar 1 disorder ([facility])    Blood transfusion without reported diagnosis    Phreesia 01/17/2020   Chest pain 03/01/2022   03/2021 xr negative.   Chronic headaches    Depression    Phreesia 01/17/2020   Lactose intolerance 03/01/2022   Major depressive disorder    Nausea 03/01/2022   Pregnancy, location unknown 11/21/2021   Formatting of this note might be different  from the original. [facility] Quant Book [place] Language: English There is no such number on file (mobile). [phone] (home) [address] [place] [address] No e-mail address on record  Lab Hours [place] (7th floor [place], [address]): M-F 7:30-18:00 [facility]: M-F 8:00-17:30 [facility]: M-F 8:30-17:00 (cl   Substance abuse ([facility])    Phreesia 01/17/2020     Past Surgical History:  Procedure Laterality Date   CESAREAN SECTION N/A    Phreesia 01/17/2020   COLONOSCOPY     earlier in year 2022   DILATION AND CURETTAGE OF UTERUS     HAND SURGERY Right     WISDOM TOOTH EXTRACTION      Family History  Problem Relation Age of Onset   Hypertension Mother    Diabetes Mother    Stroke Mother    Depression Mother    Depression Sister    Irritable bowel syndrome Sister    Asthma Brother    Allergic rhinitis Brother    Migraines Brother    Depression Brother    Stroke Maternal Grandmother    Diabetes Maternal Grandmother    Alcohol abuse Maternal Grandfather    Stroke Paternal Grandmother    Breast cancer Paternal Grandmother    Alcohol abuse Paternal Grandfather    Lupus Half-Sister    Angioedema Neg Hx    Eczema Neg Hx    Immunodeficiency Neg Hx    Atopy Neg Hx     Social History   Tobacco Use   Smoking status: Former    Types: Cigarettes    Quit date: 09/07/2006    Years since quitting: 15.7   Smokeless tobacco: Never  Vaping Use   Vaping Use: Never used  Substance Use Topics   Alcohol use: Yes    Comment: now soically   Drug use: Not Currently    Types: Marijuana    ROS   Objective:   Vitals: BP 129/79 (BP Location: Left Arm)   Pulse 76   Temp 98.2 F (36.8 C) (Oral)   Resp 12   LMP 05/05/2022 (Approximate)   SpO2 98%   Physical Exam Constitutional:      General: She is not in acute distress.    Appearance: Normal appearance. She is well-developed. She is not ill-appearing, toxic-appearing or diaphoretic.  HENT:     Head: Normocephalic and atraumatic.     Right Ear: External ear normal.     Left Ear: External ear normal.     Nose: Nose normal.     Mouth/Throat:     Mouth: Mucous membranes are moist.     Pharynx: No pharyngeal swelling, oropharyngeal exudate, posterior oropharyngeal erythema or uvula swelling.     Tonsils: No tonsillar exudate or tonsillar abscesses. 0 on the right. 0 on the left.     Comments: Significant postnasal drainage overlying pharynx. Eyes:     General: No scleral icterus.       Right eye: No discharge.        Left eye: No discharge.     Extraocular Movements: Extraocular  movements intact.  Cardiovascular:     Rate and Rhythm: Normal rate and regular rhythm.     Pulses: No decreased pulses.     Heart sounds: Normal heart sounds. No murmur heard.    No friction rub. No gallop.  Pulmonary:     Effort: Pulmonary effort is normal. No respiratory distress.     Breath sounds: No stridor.  No decreased breath sounds, wheezing, rhonchi or rales.  Chest:     Chest wall: No tenderness.  Skin:    General: Skin is warm and dry.  Neurological:     General: No focal deficit present.     Mental Status: She is alert and oriented to person, place, and time.  Psychiatric:        Mood and Affect: Mood normal.        Behavior: Behavior normal.     DG Chest 2 View  Result Date: 06/08/2022 CLINICAL DATA:  Cough and shortness of breath EXAM: CHEST - 2 VIEW COMPARISON:  Radiograph 04/19/2021 FINDINGS: Unchanged cardiomediastinal silhouette. There is no focal airspace consolidation. There is no pleural effusion or pneumothorax. There is no acute osseous abnormality. IMPRESSION: No evidence of acute cardiopulmonary disease. Electronically Signed   By: Caprice Renshaw M.D.   On: 06/08/2022 10:12    Assessment and Plan :   PDMP not reviewed this encounter.  1. Persistent cough   2. Mild intermittent asthma without complication   3. Post-nasal drainage   4. Allergic rhinitis, unspecified seasonality, unspecified trigger     Chest x-ray is negative.  Patient already tested positive for COVID-19 in October.  Discussed possibility of using prednisone but given her mental health, will avoid this as she has significant side effects involving her mental health with the use of prednisone and steroids in general.  Recommended using Zyrtec, pseudoephedrine, albuterol and general supportive care.  Deferred EKG for ruling out pericarditis given lack of chest pain and overall hemodynamically stable vital signs. Counseled patient on potential for adverse effects with medications  prescribed/recommended today, ER and return-to-clinic precautions discussed, patient verbalized understanding.    Wallis Bamberg, PA-C 06/08/22 1200

## 2022-06-13 ENCOUNTER — Other Ambulatory Visit (HOSPITAL_COMMUNITY)
Admission: RE | Admit: 2022-06-13 | Discharge: 2022-06-13 | Disposition: A | Discharge status: Home / Self Care | Payer: Medicare Other | Source: Ambulatory Visit | Attending: Advanced Practice Midwife | Admitting: Advanced Practice Midwife

## 2022-06-13 ENCOUNTER — Ambulatory Visit (INDEPENDENT_AMBULATORY_CARE_PROVIDER_SITE_OTHER): Payer: Medicare Other | Admitting: Advanced Practice Midwife

## 2022-06-13 ENCOUNTER — Encounter: Payer: Self-pay | Admitting: Advanced Practice Midwife

## 2022-06-13 VITALS — BP 114/63 | HR 65 | Ht 65.0 in | Wt 129.4 lb

## 2022-06-13 DIAGNOSIS — R109 Unspecified abdominal pain: Secondary | ICD-10-CM

## 2022-06-13 DIAGNOSIS — G8929 Other chronic pain: Secondary | ICD-10-CM

## 2022-06-13 DIAGNOSIS — R102 Pelvic and perineal pain: Secondary | ICD-10-CM | POA: Insufficient documentation

## 2022-06-13 DIAGNOSIS — N941 Unspecified dyspareunia: Secondary | ICD-10-CM | POA: Diagnosis not present

## 2022-06-13 DIAGNOSIS — N730 Acute parametritis and pelvic cellulitis: Secondary | ICD-10-CM | POA: Diagnosis not present

## 2022-06-13 DIAGNOSIS — N898 Other specified noninflammatory disorders of vagina: Secondary | ICD-10-CM

## 2022-06-13 LAB — POCT URINALYSIS DIPSTICK
Bilirubin, UA: NEGATIVE
Blood, UA: NEGATIVE
Glucose, UA: NEGATIVE
Ketones, UA: NEGATIVE
Leukocytes, UA: NEGATIVE
Nitrite, UA: NEGATIVE
Protein, UA: NEGATIVE
Spec Grav, UA: 1.01 (ref 1.010–1.025)
Urobilinogen, UA: 0.2 E.U./dL
pH, UA: 5 (ref 5.0–8.0)

## 2022-06-13 LAB — POCT URINE PREGNANCY: Preg Test, Ur: NEGATIVE

## 2022-06-13 MED ORDER — FLUCONAZOLE 150 MG PO TABS: 150.0000 mg | ORAL_TABLET | Freq: Once | ORAL | 1 refills | Status: AC

## 2022-06-13 MED ORDER — CEFTRIAXONE SODIUM 500 MG IJ SOLR
500.0000 mg | Freq: Once | INTRAMUSCULAR | Status: AC
  Administered 2022-06-13: 500 mg via INTRAMUSCULAR

## 2022-06-13 MED ORDER — AZITHROMYCIN 500 MG PO TABS: 1000.0000 mg | ORAL_TABLET | Freq: Once | ORAL | 0 refills | Status: AC

## 2022-06-13 MED ORDER — METRONIDAZOLE 500 MG PO TABS: 500.0000 mg | ORAL_TABLET | Freq: Two times a day (BID) | ORAL | 0 refills | Status: AC

## 2022-06-13 NOTE — Progress Notes (Signed)
   GYNECOLOGY PROGRESS NOTE  History:  36 y.o. [id number] presents to [facility] office today for problem gyn visit. She reports pelvic pain continues.  Periods are more regular, without in between bleeding but pain with intercourse and sharp shooting pain in her low abdomen continues.  She denies h/a, dizziness, shortness of breath, n/v, or fever/chills.    The following portions of the patient's history were reviewed and updated as appropriate: allergies, current medications, past family history, past medical history, past social history, past surgical history and problem list. Last pap smear reported on 09/2021 was normal, but no records available. On chart review, pt had an ED visit on 3/24 with pelvic exam so may not have had a Pap.    Health Maintenance Due  Topic Date Due   Medicare Annual Wellness (AWV)  Never done     Review of Systems:  Pertinent items are noted in HPI.   Objective:  Physical Exam Blood pressure 114/63, pulse 65, height 5\' 5"  (1.651 m), weight 129 lb 6.4 oz (58.7 kg), last menstrual period 06/07/2022. VS reviewed, nursing note reviewed,  Constitutional: well developed, well nourished, no distress HEENT: normocephalic CV: normal rate Pulm/chest wall: normal effort Breast Exam: deferred Abdomen: soft Neuro: alert and oriented x 3 Skin: warm, dry Psych: affect normal Pelvic exam: Cervix pink, visually closed, without lesion, scant white creamy discharge, vaginal walls and external genitalia normal Bimanual exam: Cervix 0/long/high, firm, anterior, neg CMT, uterus nontender, nonenlarged, adnexa without tenderness, enlargement, or mass  Assessment & Plan:  1. Abdominal cramping  - POCT urinalysis dipstick - POCT urine pregnancy  2. PID (acute pelvic inflammatory disease) -Pt concerned about PID given shooting pain in her low abdomen and pain with intercourse. --Pt negative for STI with recent testing but discussed and reasonable to treat presumptively given  symptoms.  - cefTRIAXone (ROCEPHIN) injection 500 mg - azithromycin (ZITHROMAX) 500 MG tablet; Take 2 tablets (1,000 mg total) by mouth once for 1 dose.  Dispense: 2 tablet; Refill: 0 - metroNIDAZOLE (FLAGYL) 500 MG tablet; Take 1 tablet (500 mg total) by mouth 2 (two) times daily for 7 days.  Dispense: 14 tablet; Refill: 0 --Rx for Diflucan for current vaginal irritation and to take after course of antibiotics  3. Dyspareunia in female --06/09/2022 was ordered but never scheduled 02/2022 so reordered and pt to have 03/2022 and follow up in office with MD for chronic pain  --Pap not collected today.  Needs to be collected at follow up visit.  - US PELVIC COMPLETE WITH TRANSVAGINAL; Future  4. Chronic pelvic pain in female  - US PELVIC COMPLETE WITH TRANSVAGINAL; Future   Return in about 1 month (around 07/13/2022) for Follow up with MD AFTER outpatient ultrasound.   07/15/2022, CNM 5:39 PM

## 2022-06-13 NOTE — Progress Notes (Signed)
Pt following up on bleeding in between cycles. Pt states this has improved, but still occurring. Still having sharp pains 8/10. Pt reports having sensitivity during sex, and urinary frequency. No other concerns at this time.

## 2022-06-15 LAB — CERVICOVAGINAL ANCILLARY ONLY
Bacterial Vaginitis (gardnerella): POSITIVE — AB
Candida Glabrata: NEGATIVE
Candida Vaginitis: NEGATIVE
Chlamydia: NEGATIVE
Comment: NEGATIVE
Comment: NEGATIVE
Comment: NEGATIVE
Comment: NEGATIVE
Comment: NEGATIVE
Comment: NORMAL
Neisseria Gonorrhea: NEGATIVE
Trichomonas: NEGATIVE

## 2022-07-07 ENCOUNTER — Ambulatory Visit
Admission: EM | Admit: 2022-07-07 | Discharge: 2022-07-07 | Disposition: A | Discharge status: Home / Self Care | Payer: Medicare Other | Attending: Urgent Care | Admitting: Urgent Care

## 2022-07-07 DIAGNOSIS — S40812A Abrasion of left upper arm, initial encounter: Secondary | ICD-10-CM | POA: Diagnosis not present

## 2022-07-07 DIAGNOSIS — Z23 Encounter for immunization: Secondary | ICD-10-CM | POA: Diagnosis not present

## 2022-07-07 DIAGNOSIS — L309 Dermatitis, unspecified: Secondary | ICD-10-CM

## 2022-07-07 MED ORDER — CEPHALEXIN 500 MG PO CAPS: 500.0000 mg | ORAL_CAPSULE | Freq: Three times a day (TID) | ORAL | 0 refills | Status: AC

## 2022-07-07 MED ORDER — TRIAMCINOLONE ACETONIDE 0.5 % EX CREA: 1.0000 | TOPICAL_CREAM | Freq: Two times a day (BID) | CUTANEOUS | 0 refills | Status: DC

## 2022-07-07 MED ORDER — TETANUS-DIPHTH-ACELL PERTUSSIS 5-2.5-18.5 LF-MCG/0.5 IM SUSY
0.5000 mL | PREFILLED_SYRINGE | Freq: Once | INTRAMUSCULAR | Status: AC
  Administered 2022-07-07: 0.5 mL via INTRAMUSCULAR

## 2022-07-07 NOTE — ED Triage Notes (Signed)
Pt states she cut left forearm on a muffler at work 12/13-slight abrasion noted with redness-NAD-steady gait

## 2022-07-07 NOTE — Discharge Instructions (Addendum)
Please apply the topical triamcinolone to the itchy bumps of your arm.  Use this up to twice daily for no more than 14 days.  Do not occlude with a bandage. Please start taking the oral antibiotic as prescribed to help prevent skin infection. We updated your tetanus vaccination here today. Follow up with PCP if any symptoms persist.

## 2022-07-07 NOTE — ED Provider Notes (Signed)
[facility]    CSN: [id number] Arrival date & time: 07/07/22  1707      History   Chief Complaint Chief Complaint  Patient presents with   Arm Injury    HPI Kimberly Harper is a 36 y.o. female.   36 year old female presents today due to an arm injury that occurred at work on Wednesday.  She states she was pushing a box and it toppled onto her left forearm.  She states it caused a slight abrasion and a "small flap".  She pulled the flap off.  She states the skin has closed very well with no drainage, but noted over the past 24 hours a slightly red area that has become painful.  She also notes several bumps that are itchy.   Arm Injury   Past Medical History:  Diagnosis Date   Alcoholic (HCC)    Anemia    Phreesia 01/17/2020   Anxiety    Asthma    Asthma 06/22/2021   Bipolar 1 disorder (HCC)    Blood transfusion without reported diagnosis    Phreesia 01/17/2020   Chest pain 03/01/2022   03/2021 xr negative.   Chronic headaches    Depression    Phreesia 01/17/2020   Lactose intolerance 03/01/2022   Major depressive disorder    Nausea 03/01/2022   Pregnancy, location unknown 11/21/2021   Formatting of this note might be different from the original. [facility] Quant Book Loralai Lemley Language: English There is no such number on file (mobile). [phone] (home) [address] [place] [address] No e-mail address on record  Lab Hours [facility] [place] (7th floor [place], [address]): M-F 7:30-18:00 [facility]: M-F 8:00-17:30 [facility]: M-F 8:30-17:00 (cl   Substance abuse (HCC)    Phreesia 01/17/2020    Patient Active Problem List   Diagnosis Date Noted   Chronic pelvic pain in female 06/13/2022   Abnormal uterine bleeding (AUB) 03/13/2022   Nausea 03/01/2022   Chest pain 03/01/2022   Lactose intolerance 03/01/2022   Rash 03/01/2022   Epigastric pain 02/08/2022   Other allergic rhinitis 06/22/2021   Heartburn 06/22/2021    Panic disorder 04/12/2020   GAD (generalized anxiety disorder) 03/18/2020   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   Borderline personality disorder (HCC) 02/06/2020   Bipolar 2 disorder, major depressive episode (HCC) 12/06/2019    Past Surgical History:  Procedure Laterality Date   CESAREAN SECTION N/A    Phreesia 01/17/2020   COLONOSCOPY     earlier in year 2022   DILATION AND CURETTAGE OF UTERUS     HAND SURGERY Right    WISDOM TOOTH EXTRACTION      OB History     Gravida  4   Para  1   Term      Preterm  1   AB  3   Living  1      SAB  3   IAB      Ectopic      Multiple      Live Births  1            Home Medications    Prior to Admission medications   Medication Sig Start Date End Date Taking? Authorizing Provider  cephALEXin (KEFLEX) 500 MG capsule Take 1 capsule (500 mg total) by mouth 3 (three) times daily for 5 days. 07/07/22 07/12/22 Yes Onnie Alatorre L, PA  triamcinolone cream (KENALOG) 0.5 % Apply 1 Application topically 2 (two)  times daily. 07/07/22  Yes Yashar Inclan L, PA  albuterol (VENTOLIN HFA) 108 (90 Base) MCG/ACT inhaler Inhale 1-2 puffs into the lungs every 6 (six) hours as needed for wheezing or shortness of breath. 06/08/22   Wallis Bamberg, PA-C  ALPRAZolam Prudy Feeler) 1 MG tablet Take 1 tablet (1 mg total) by mouth 2 (two) times daily as needed for anxiety. 02/21/22   Janeece Agee, NP  cholecalciferol (VITAMIN D3) 25 MCG (1000 UNIT) tablet Take 1,000 Units by mouth daily.    [provider]  pantoprazole (PROTONIX) 20 MG tablet Take 20 mg by mouth daily.    [provider]  pramipexole (MIRAPEX) 0.25 MG tablet TAKE 1 TABLET(0.25 MG) BY MOUTH THREE TIMES DAILY 04/24/22   Lula Olszewski, MD  promethazine-dextromethorphan (PROMETHAZINE-DM) 6.25-15 MG/5ML syrup Take 2.5 mLs by mouth 3 (three) times daily as needed for cough. 06/08/22   Wallis Bamberg, PA-C  propranolol (INDERAL) 20 MG tablet TAKE 1 TABLET(20 MG) BY MOUTH DAILY  12/04/21   Janeece Agee, NP  pseudoephedrine (SUDAFED) 60 MG tablet Take 1 tablet (60 mg total) by mouth every 8 (eight) hours as needed for congestion. 06/08/22   Wallis Bamberg, PA-C    Family History Family History  Problem Relation Age of Onset   Hypertension Mother    Diabetes Mother    Stroke Mother    Depression Mother    Depression Sister    Irritable bowel syndrome Sister    Asthma Brother    Allergic rhinitis Brother    Migraines Brother    Depression Brother    Stroke Maternal Grandmother    Diabetes Maternal Grandmother    Alcohol abuse Maternal Grandfather    Stroke Paternal Grandmother    Breast cancer Paternal Grandmother    Alcohol abuse Paternal Grandfather    Lupus Half-Sister    Angioedema Neg Hx    Eczema Neg Hx    Immunodeficiency Neg Hx    Atopy Neg Hx     Social History Social History   Tobacco Use   Smoking status: Former    Types: Cigarettes    Quit date: 09/07/2006    Years since quitting: 15.8   Smokeless tobacco: Never  Vaping Use   Vaping Use: Never used  Substance Use Topics   Alcohol use: Yes    Comment: occ   Drug use: Not Currently     Allergies   Multiple vitamins-iron and Prednisone   Review of Systems Review of Systems As per HPI  Physical Exam Triage Vital Signs ED Triage Vitals  Enc Vitals Group     BP 07/07/22 1744 124/82     Pulse Rate 07/07/22 1744 72     Resp 07/07/22 1744 16     Temp 07/07/22 1744 98.1 F (36.7 C)     Temp Source 07/07/22 1744 Oral     SpO2 07/07/22 1744 100 %     Weight --      Height --      Head Circumference --      Peak Flow --      Pain Score 07/07/22 1751 0     Pain Loc --      Pain Edu? --      Excl. in GC? --    No data found.  Updated Vital Signs BP 124/82 (BP Location: Right Arm)   Pulse 72   Temp 98.1 F (36.7 C) (Oral)   Resp 16   LMP 07/05/2022 (Approximate)   SpO2 100%  Visual Acuity Right Eye Distance:   Left Eye Distance:   Bilateral Distance:     Right Eye Near:   Left Eye Near:    Bilateral Near:     Physical Exam Vitals and nursing note reviewed.  Constitutional:      General: She is not in acute distress.    Appearance: Normal appearance. She is normal weight. She is not ill-appearing, toxic-appearing or diaphoretic.  HENT:     Head: Normocephalic and atraumatic.  Cardiovascular:     Rate and Rhythm: Normal rate.  Pulmonary:     Effort: Pulmonary effort is normal. No respiratory distress.  Skin:    General: Skin is warm.     Capillary Refill: Capillary refill takes less than 2 seconds.     Coloration: Skin is not jaundiced.     Findings: Erythema (minimal erythema immediately surrounding L dorsal forearm. Healing lac with scab formation.) and rash (localized papules noted to the superior portion of lac) present. No bruising.  Neurological:     General: No focal deficit present.     Mental Status: She is alert.      UC Treatments / Results  Labs (all labs ordered are listed, but only abnormal results are displayed) Labs Reviewed - No data to display  EKG   Radiology No results found.  Procedures Procedures (including critical care time)  Medications Ordered in UC Medications  Tdap (BOOSTRIX) injection 0.5 mL (0.5 mLs Intramuscular Given 07/07/22 1909)    Initial Impression / Assessment and Plan / UC Course  I have reviewed the triage vital signs and the nursing notes.  Pertinent labs & imaging results that were available during my care of the patient were reviewed by me and considered in my medical decision making (see chart for details).     Abrasion -the described lack is now completely closed, scabbed over healing well.  It looks as if it was more of an abrasion/possible shallow puncture wound.  Patient is tender to touch in that area.  Minimal erythema, will start antibiotics to cover for possible developing wound infection. Tetanus -updated in clinic today.  Has been greater than 15 years since  last 1. Dermatitis -topical triamcinolone to the affected papules of skin.  Do not apply topical bandages.  Final Clinical Impressions(s) / UC Diagnoses   Final diagnoses:  Abrasion of left upper extremity, initial encounter  Need for prophylactic vaccination with combined diphtheria-tetanus-pertussis (DTP) vaccine  Dermatitis     Discharge Instructions      Please apply the topical triamcinolone to the itchy bumps of your arm.  Use this up to twice daily for no more than 14 days.  Do not occlude with a bandage. Please start taking the oral antibiotic as prescribed to help prevent skin infection. We updated your tetanus vaccination here today. Follow up with PCP if any symptoms persist.     ED Prescriptions     Medication Sig Dispense Auth. Provider   cephALEXin (KEFLEX) 500 MG capsule Take 1 capsule (500 mg total) by mouth 3 (three) times daily for 5 days. 15 capsule Nylia Gavina L, PA   triamcinolone cream (KENALOG) 0.5 % Apply 1 Application topically 2 (two) times daily. 15 g Lezlie Ritchey L, [place]      PDMP not reviewed this encounter.   Maretta Bees, [place] 07/07/22 2046

## 2022-07-16 ENCOUNTER — Other Ambulatory Visit: Payer: Self-pay | Admitting: Internal Medicine

## 2022-07-16 DIAGNOSIS — F411 Generalized anxiety disorder: Secondary | ICD-10-CM

## 2022-07-18 ENCOUNTER — Other Ambulatory Visit: Payer: Self-pay

## 2022-07-18 NOTE — Telephone Encounter (Signed)
Approve renww

## 2022-07-20 ENCOUNTER — Ambulatory Visit (INDEPENDENT_AMBULATORY_CARE_PROVIDER_SITE_OTHER): Payer: Medicare Other | Admitting: Internal Medicine

## 2022-07-20 ENCOUNTER — Encounter: Payer: Self-pay | Admitting: Internal Medicine

## 2022-07-20 ENCOUNTER — Ambulatory Visit
Admission: RE | Admit: 2022-07-20 | Discharge: 2022-07-20 | Disposition: A | Discharge status: Home / Self Care | Payer: Self-pay | Source: Ambulatory Visit | Attending: Internal Medicine | Admitting: Internal Medicine

## 2022-07-20 VITALS — BP 107/64 | HR 61 | Temp 98.1°F | Ht 65.0 in | Wt 129.0 lb

## 2022-07-20 DIAGNOSIS — F410 Panic disorder [episodic paroxysmal anxiety] without agoraphobia: Secondary | ICD-10-CM

## 2022-07-20 DIAGNOSIS — F3181 Bipolar II disorder: Secondary | ICD-10-CM

## 2022-07-20 DIAGNOSIS — S299XXA Unspecified injury of thorax, initial encounter: Secondary | ICD-10-CM | POA: Insufficient documentation

## 2022-07-20 DIAGNOSIS — K219 Gastro-esophageal reflux disease without esophagitis: Secondary | ICD-10-CM

## 2022-07-20 DIAGNOSIS — F411 Generalized anxiety disorder: Secondary | ICD-10-CM

## 2022-07-20 MED ORDER — NAPROXEN 500 MG PO TABS: 500.0000 mg | ORAL_TABLET | Freq: Two times a day (BID) | ORAL | 0 refills | Status: DC

## 2022-07-20 MED ORDER — PANTOPRAZOLE SODIUM 40 MG PO TBEC: 40.0000 mg | DELAYED_RELEASE_TABLET | Freq: Every day | ORAL | 1 refills | Status: DC

## 2022-07-20 MED ORDER — CYCLOBENZAPRINE HCL 5 MG PO TABS: 5.0000 mg | ORAL_TABLET | Freq: Three times a day (TID) | ORAL | 1 refills | Status: DC | PRN

## 2022-07-20 MED ORDER — ALPRAZOLAM 1 MG PO TABS: 1.0000 mg | ORAL_TABLET | Freq: Two times a day (BID) | ORAL | 0 refills | Status: DC | PRN

## 2022-07-20 MED ORDER — PROPRANOLOL HCL 20 MG PO TABS: ORAL_TABLET | ORAL | 3 refills | Status: DC

## 2022-07-20 NOTE — Assessment & Plan Note (Signed)
I sent in Flexeril and prescription strength Aleve referral to sports medicine and MRI of the chest as per her request she declined chest x-ray as she feels confident the mechanism of injury did not break a rib and she declined physical therapy over cost and time concerns
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
## 2022-07-26 NOTE — Progress Notes (Deleted)
   I, [name], LAT, ATC acting as a scribe for Kimberly Leader, MD.  Subjective:    CC: ***  HPI: Pt is a 37 y/o female c/o ??? Pt locates pain to ?  Dx imaging: 06/08/22 Chest XR  Pertinent review of Systems: ***  Relevant historical information: ***   Objective:   There were no vitals filed for this visit. General: Well Developed, well nourished, and in no acute distress.   MSK: ***  Lab and Radiology Results No results found for this or any previous visit (from the past 72 hour(s)). No results found.    Impression and Recommendations:    Assessment and Plan: 37 y.o. female with ***.  PDMP not reviewed this encounter. No orders of the defined types were placed in this encounter.  No orders of the defined types were placed in this encounter.   Discussed warning signs or symptoms. Please see discharge instructions. Patient expresses understanding.   ***

## 2022-07-27 ENCOUNTER — Ambulatory Visit: Payer: Medicare Other | Admitting: Family Medicine

## 2022-08-01 ENCOUNTER — Other Ambulatory Visit: Payer: Self-pay | Admitting: Internal Medicine

## 2022-08-01 DIAGNOSIS — S299XXA Unspecified injury of thorax, initial encounter: Secondary | ICD-10-CM

## 2022-08-04 ENCOUNTER — Other Ambulatory Visit: Payer: Self-pay

## 2022-08-04 DIAGNOSIS — S299XXA Unspecified injury of thorax, initial encounter: Secondary | ICD-10-CM

## 2022-08-04 NOTE — Telephone Encounter (Signed)
This prescription has already been sent in by Dr.Morrison.

## 2022-08-07 ENCOUNTER — Ambulatory Visit
Admission: EM | Admit: 2022-08-07 | Discharge: 2022-08-07 | Disposition: A | Discharge status: Home / Self Care | Payer: Medicare Other | Attending: Urgent Care | Admitting: Urgent Care

## 2022-08-07 ENCOUNTER — Ambulatory Visit (INDEPENDENT_AMBULATORY_CARE_PROVIDER_SITE_OTHER): Payer: Medicare Other

## 2022-08-07 DIAGNOSIS — M79645 Pain in left finger(s): Secondary | ICD-10-CM

## 2022-08-07 NOTE — ED Provider Notes (Signed)
[facility]  Note:  This document was prepared using Systems analyst and may include unintentional dictation errors.  MRN: [id number] DOB: 02/11/1986  Subjective:   Kimberly Harper is a 37 y.o. female presenting for 73-month history of persistent intermittent moderate to severe sharp pains of the left fifth finger at the DIP and over the radial aspect of the distal phalanx.  No known trauma or inciting event.  Patient does have to do a lot of heavy lifting, fast-paced work using her hands.  No bruising, swelling, bony deformity.  No current facility-administered medications for this encounter.  Current Outpatient Medications:    albuterol (VENTOLIN HFA) 108 (90 Base) MCG/ACT inhaler, Inhale 1-2 puffs into the lungs every 6 (six) hours as needed for wheezing or shortness of breath., Disp: 18 g, Rfl: 0   ALPRAZolam (XANAX) 1 MG tablet, Take 1 tablet (1 mg total) by mouth 2 (two) times daily as needed for anxiety., Disp: 15 tablet, Rfl: 0   cholecalciferol (VITAMIN D3) 25 MCG (1000 UNIT) tablet, Take 1,000 Units by mouth daily., Disp: , Rfl:    cyclobenzaprine (FLEXERIL) 5 MG tablet, Take 1 tablet (5 mg total) by mouth 3 (three) times daily as needed for muscle spasms., Disp: 30 tablet, Rfl: 1   naproxen (NAPROSYN) 500 MG tablet, TAKE 1 TABLET(500 MG) BY MOUTH TWICE DAILY WITH A MEAL, Disp: 30 tablet, Rfl: 0   pantoprazole (PROTONIX) 40 MG tablet, Take 1 tablet (40 mg total) by mouth daily., Disp: 90 tablet, Rfl: 1   pramipexole (MIRAPEX) 0.25 MG tablet, TAKE 1 TABLET(0.25 MG) BY MOUTH THREE TIMES DAILY, Disp: 270 tablet, Rfl: 0   promethazine-dextromethorphan (PROMETHAZINE-DM) 6.25-15 MG/5ML syrup, Take 2.5 mLs by mouth 3 (three) times daily as needed for cough., Disp: 100 mL, Rfl: 0   propranolol (INDERAL) 20 MG tablet, TAKE 1 TABLET(20 MG) BY MOUTH DAILY, Disp: 90 tablet, Rfl: 3   pseudoephedrine (SUDAFED) 60 MG tablet, Take 1 tablet (60 mg  total) by mouth every 8 (eight) hours as needed for congestion., Disp: 30 tablet, Rfl: 0   triamcinolone cream (KENALOG) 0.5 %, Apply 1 Application topically 2 (two) times daily., Disp: 15 g, Rfl: 0   Allergies  Allergen Reactions   Multiple Vitamins-Iron Swelling    States that vitamins, folic acid and iron have caused swelling of tongue and gums   Prednisone Other (See Comments)    Altered mental status    Past Medical History:  Diagnosis Date   Alcoholic ([facility])    Anemia    Phreesia 01/17/2020   Anxiety    Asthma    Asthma 06/22/2021   Bipolar 1 disorder ([place])    Blood transfusion without reported diagnosis    Phreesia 01/17/2020   Chest pain 03/01/2022   03/2021 xr negative.   Chronic headaches    Depression    Phreesia 01/17/2020   Lactose intolerance 03/01/2022   Major depressive disorder    Nausea 03/01/2022   Pregnancy, location unknown 11/21/2021   Formatting of this note might be different from the original. [employer] Language: English There is no such number on file (mobile). [phone] (home) [place] [address] No e-mail address on record  Lab Hours [facility] (7th floor [place], [address]): M-F 7:30-18:00 [facility]: M-F 8:00-17:30 [facility]: M-F 8:30-17:00 (cl   Substance abuse ([place])    [facility] 01/17/2020     Past  Surgical History:  Procedure Laterality Date   CESAREAN SECTION N/A    Phreesia 01/17/2020   COLONOSCOPY     earlier in year 2022   DILATION AND CURETTAGE OF UTERUS     HAND SURGERY Right    WISDOM TOOTH EXTRACTION      Family History  Problem Relation Age of Onset   Hypertension Mother    Diabetes Mother    Stroke Mother    Depression Mother    Depression Sister    Irritable bowel syndrome Sister    Asthma Brother    Allergic rhinitis Brother    Migraines Brother    Depression Brother    Stroke Maternal Grandmother    Diabetes Maternal Grandmother    Alcohol abuse  Maternal Grandfather    Stroke Paternal Grandmother    Breast cancer Paternal Grandmother    Alcohol abuse Paternal Grandfather    Lupus Half-Sister    Angioedema Neg Hx    Eczema Neg Hx    Immunodeficiency Neg Hx    Atopy Neg Hx     Social History   Tobacco Use   Smoking status: Former    Types: Cigarettes    Quit date: 09/07/2006    Years since quitting: 15.9   Smokeless tobacco: Never  Vaping Use   Vaping Use: Never used  Substance Use Topics   Alcohol use: Yes    Comment: occ   Drug use: Not Currently    ROS   Objective:   Vitals: BP 124/76 (BP Location: Right Arm)   Pulse (!) 57   Temp 98.2 F (36.8 C) (Oral)   Resp 16   LMP 08/05/2022   SpO2 97%   Physical Exam Constitutional:      General: She is not in acute distress.    Appearance: Normal appearance. She is well-developed. She is not ill-appearing, toxic-appearing or diaphoretic.  HENT:     Head: Normocephalic and atraumatic.     Nose: Nose normal.     Mouth/Throat:     Mouth: Mucous membranes are moist.  Eyes:     General: No scleral icterus.       Right eye: No discharge.        Left eye: No discharge.     Extraocular Movements: Extraocular movements intact.  Cardiovascular:     Rate and Rhythm: Normal rate.  Pulmonary:     Effort: Pulmonary effort is normal.  Musculoskeletal:     Left hand: Tenderness and bony tenderness (over areas outlined) present. No swelling, deformity or lacerations. Normal range of motion. Normal strength. Normal sensation. There is no disruption of two-point discrimination. Normal capillary refill.       Hands:  Skin:    General: Skin is warm and dry.  Neurological:     General: No focal deficit present.     Mental Status: She is alert and oriented to person, place, and time.  Psychiatric:        Mood and Affect: Mood normal.        Behavior: Behavior normal.    DG Finger Little Left  Result Date: 08/07/2022 CLINICAL DATA:  Little finger pain. EXAM: LEFT  LITTLE FINGER 2+V COMPARISON:  None Available. FINDINGS: No evidence for an acute fracture. No subluxation or dislocation. No worrisome lytic or sclerotic osseous abnormality. IMPRESSION: Negative. Electronically Signed   By: Misty Stanley M.D.   On: 08/07/2022 13:14    Assessment and Plan :   PDMP not reviewed this encounter.  1.  Finger pain, left     Patient very likely has inflammatory left little finger pain over the distal phalanx.  Recommended buddy tape system, ibuprofen or naproxen.  Patient declined any prescriptions.  Also needs to follow-up with [facility] given chronic nature of her pain. Counseled patient on potential for adverse effects with medications prescribed/recommended today, ER and return-to-clinic precautions discussed, patient verbalized understanding.    Wallis Bamberg, [place] 08/07/22 1333

## 2022-08-07 NOTE — ED Triage Notes (Signed)
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

## 2022-08-07 NOTE — Discharge Instructions (Addendum)
Wear the buddy tape system as we placed it today in clinic during the day and at work. Make a follow up appointment with [facility] for a consultation regarding chronic finger pain without a fracture or dislocation.

## 2022-08-13 ENCOUNTER — Ambulatory Visit
Admission: EM | Admit: 2022-08-13 | Discharge: 2022-08-13 | Disposition: A | Discharge status: Home / Self Care | Payer: Medicare Other | Attending: Physician Assistant | Admitting: Physician Assistant

## 2022-08-13 DIAGNOSIS — N760 Acute vaginitis: Secondary | ICD-10-CM | POA: Diagnosis not present

## 2022-08-13 MED ORDER — TRIAMCINOLONE ACETONIDE 0.5 % EX CREA: 1.0000 | TOPICAL_CREAM | Freq: Two times a day (BID) | CUTANEOUS | 0 refills | Status: DC

## 2022-08-13 NOTE — ED Provider Notes (Signed)
[facility]    CSN: [id number] Arrival date & time: 08/13/22  1359      History   Chief Complaint Chief Complaint  Patient presents with   Vaginal Itching    HPI Kimberly Harper is a 37 y.o. female.   Patient here today for evaluation of vaginal irritation and itching that she has had for the last week.  She has had similar in the past and was prescribed triamcinolone cream which was very helpful.  She denies any significant discharge.  She has not had any vomiting or abdominal pain.  She denies any genital rash.  She would like STD screening but declines blood work at this time.  The history is provided by the patient.  Vaginal Itching Pertinent negatives include no abdominal pain.    Past Medical History:  Diagnosis Date   Alcoholic ([facility])    Anemia    Phreesia 01/17/2020   Anxiety    Asthma    Asthma 06/22/2021   Bipolar 1 disorder ([facility])    Blood transfusion without reported diagnosis    Phreesia 01/17/2020   Chest pain 03/01/2022   03/2021 xr negative.   Chronic headaches    Depression    Phreesia 01/17/2020   Lactose intolerance 03/01/2022   Major depressive disorder    Nausea 03/01/2022   Pregnancy, location unknown 11/21/2021   Formatting of this note might be different from the original. [facility] Quant Book Adelita Dix Language: English There is no such number on file (mobile). [phone] (home) [address] [place] [address] No e-mail address on record  Lab Hours [facility] (7th floor [place], [address]): M-F 7:30-18:00 [facility]: M-F 8:00-17:30 [facility]: M-F 8:30-17:00 (cl   Substance abuse ([facility])    Phreesia 01/17/2020    Patient Active Problem List   Diagnosis Date Noted   Chest injuries 07/20/2022   Chronic pelvic pain in female 06/13/2022   Abnormal uterine bleeding (AUB) 03/13/2022   Nausea 03/01/2022   Chest pain 03/01/2022   Lactose intolerance 03/01/2022   Rash 03/01/2022   Epigastric pain  02/08/2022   Other allergic rhinitis 06/22/2021   Heartburn 06/22/2021   Panic disorder 04/12/2020   GAD (generalized anxiety disorder) 03/18/2020   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   Borderline personality disorder (H[facility]) 02/06/2020   Bipolar 2 disorder, major depressive episode ([facility]) 12/06/2019    Past Surgical History:  Procedure Laterality Date   CESAREAN SECTION N/A    Phreesia 01/17/2020   COLONOSCOPY     earlier in year 2022   DILATION AND CURETTAGE OF UTERUS     HAND SURGERY Right    WISDOM TOOTH EXTRACTION      OB History     Gravida  4   Para  1   Term      Preterm  1   AB  3   Living  1      SAB  3   IAB      Ectopic      Multiple      Live Births  1            Home Medications    Prior to Admission medications   Medication Sig Start Date End Date Taking? Authorizing Provider  albuterol (VENTOLIN HFA) 108 (90 Base) MCG/ACT inhaler Inhale 1-2 puffs into the lungs every 6 (six) hours as needed for wheezing or shortness of breath. 06/08/22   Wallis Bamberg, PA-C  ALPRAZolam (  XANAX) 1 MG tablet Take 1 tablet (1 mg total) by mouth 2 (two) times daily as needed for anxiety. 07/20/22   Loralee Pacas, MD  cholecalciferol (VITAMIN D3) 25 MCG (1000 UNIT) tablet Take 1,000 Units by mouth daily.    [provider]  cyclobenzaprine (FLEXERIL) 5 MG tablet Take 1 tablet (5 mg total) by mouth 3 (three) times daily as needed for muscle spasms. 07/20/22   Loralee Pacas, MD  naproxen (NAPROSYN) 500 MG tablet TAKE 1 TABLET(500 MG) BY MOUTH TWICE DAILY WITH A MEAL 08/01/22   Loralee Pacas, MD  pantoprazole (PROTONIX) 40 MG tablet Take 1 tablet (40 mg total) by mouth daily. 07/20/22   Loralee Pacas, MD  pramipexole (MIRAPEX) 0.25 MG tablet TAKE 1 TABLET(0.25 MG) BY MOUTH THREE TIMES DAILY 07/18/22   Loralee Pacas, MD  promethazine-dextromethorphan (PROMETHAZINE-DM) 6.25-15 MG/5ML syrup Take 2.5 mLs by mouth 3 (three) times daily as needed for  cough. 06/08/22   Jaynee Eagles, PA-C  propranolol (INDERAL) 20 MG tablet TAKE 1 TABLET(20 MG) BY MOUTH DAILY 07/20/22   Loralee Pacas, MD  pseudoephedrine (SUDAFED) 60 MG tablet Take 1 tablet (60 mg total) by mouth every 8 (eight) hours as needed for congestion. 06/08/22   Jaynee Eagles, PA-C  triamcinolone cream (KENALOG) 0.5 % Apply 1 Application topically 2 (two) times daily. 08/13/22   Francene Finders, PA-C    Family History Family History  Problem Relation Age of Onset   Hypertension Mother    Diabetes Mother    Stroke Mother    Depression Mother    Depression Sister    Irritable bowel syndrome Sister    Asthma Brother    Allergic rhinitis Brother    Migraines Brother    Depression Brother    Stroke Maternal Grandmother    Diabetes Maternal Grandmother    Alcohol abuse Maternal Grandfather    Stroke Paternal Grandmother    Breast cancer Paternal Grandmother    Alcohol abuse Paternal Grandfather    Lupus Half-Sister    Angioedema Neg Hx    Eczema Neg Hx    Immunodeficiency Neg Hx    Atopy Neg Hx     Social History Social History   Tobacco Use   Smoking status: Former    Types: Cigarettes    Quit date: 09/07/2006    Years since quitting: 15.9   Smokeless tobacco: Never  Vaping Use   Vaping Use: Never used  Substance Use Topics   Alcohol use: Yes    Comment: occ   Drug use: Not Currently     Allergies   Multiple vitamins-iron and Prednisone   Review of Systems Review of Systems  Constitutional:  Negative for chills and fever.  Eyes:  Negative for discharge and redness.  Gastrointestinal:  Negative for abdominal pain, nausea and vomiting.  Genitourinary:  Positive for urgency. Negative for genital sores and vaginal discharge.     Physical Exam Triage Vital Signs ED Triage Vitals  Enc Vitals Group     BP 08/13/22 1522 108/65     Pulse Rate 08/13/22 1522 (!) 52     Resp 08/13/22 1522 16     Temp 08/13/22 1522 98.4 F (36.9 C)     Temp Source  08/13/22 1522 Oral     SpO2 08/13/22 1522 98 %     Weight --      Height --      Head Circumference --      Peak  Flow --      Pain Score 08/13/22 1521 0     Pain Loc --      Pain Edu? --      Excl. in [place]? --    No data found.  Updated Vital Signs BP 108/65 (BP Location: Left Arm)   Pulse (!) 52   Temp 98.4 F (36.9 C) (Oral)   Resp 16   LMP 08/05/2022   SpO2 98%      Physical Exam Vitals and nursing note reviewed.  Constitutional:      General: She is not in acute distress.    Appearance: Normal appearance. She is not ill-appearing.  HENT:     Head: Normocephalic and atraumatic.  Eyes:     Conjunctiva/sclera: Conjunctivae normal.  Cardiovascular:     Rate and Rhythm: Normal rate.  Pulmonary:     Effort: Pulmonary effort is normal. No respiratory distress.  Neurological:     Mental Status: She is alert.  Psychiatric:        Mood and Affect: Mood normal.        Behavior: Behavior normal.        Thought Content: Thought content normal.      UC Treatments / Results  Labs (all labs ordered are listed, but only abnormal results are displayed) Labs Reviewed  CERVICOVAGINAL ANCILLARY ONLY    EKG   Radiology No results found.  Procedures Procedures (including critical care time)  Medications Ordered in UC Medications - No data to display  Initial Impression / Assessment and Plan / UC Course  I have reviewed the triage vital signs and the nursing notes.  Pertinent labs & imaging results that were available during my care of the patient were reviewed by me and considered in my medical decision making (see chart for details).    Screening ordered for yeast, BV as well as gonorrhea, chlamydia and trichomonas.  Triamcinolone cream refilled and will await results of screening for further recommendation.  Encouraged follow-up if no gradual improvement or with any further concerns.  Final Clinical Impressions(s) / UC Diagnoses   Final diagnoses:  Acute  vaginitis   Discharge Instructions   None    ED Prescriptions     Medication Sig Dispense Auth. Provider   triamcinolone cream (KENALOG) 0.5 % Apply 1 Application topically 2 (two) times daily. 15 g Francene Finders, PA-C      PDMP not reviewed this encounter.   Francene Finders, PA-C 08/13/22 [phone]

## 2022-08-13 NOTE — ED Triage Notes (Signed)
Pt c/o vaginal irritation and itching x 1 week-NAD-steady gait

## 2022-08-15 ENCOUNTER — Telehealth (HOSPITAL_COMMUNITY): Payer: Self-pay | Admitting: Emergency Medicine

## 2022-08-15 LAB — CERVICOVAGINAL ANCILLARY ONLY
Bacterial Vaginitis (gardnerella): NEGATIVE
Candida Glabrata: NEGATIVE
Candida Vaginitis: POSITIVE — AB
Chlamydia: NEGATIVE
Comment: NEGATIVE
Comment: NEGATIVE
Comment: NEGATIVE
Comment: NEGATIVE
Comment: NEGATIVE
Comment: NORMAL
Neisseria Gonorrhea: NEGATIVE
Trichomonas: NEGATIVE

## 2022-08-15 MED ORDER — FLUCONAZOLE 150 MG PO TABS: 150.0000 mg | ORAL_TABLET | Freq: Once | ORAL | 0 refills | Status: AC

## 2022-08-17 ENCOUNTER — Encounter (HOSPITAL_COMMUNITY): Payer: Self-pay

## 2022-08-17 ENCOUNTER — Ambulatory Visit (INDEPENDENT_AMBULATORY_CARE_PROVIDER_SITE_OTHER): Payer: Medicare Other | Admitting: Clinical

## 2022-08-17 ENCOUNTER — Encounter (HOSPITAL_COMMUNITY): Payer: Self-pay | Admitting: Clinical

## 2022-08-17 DIAGNOSIS — F603 Borderline personality disorder: Secondary | ICD-10-CM

## 2022-08-17 DIAGNOSIS — F411 Generalized anxiety disorder: Secondary | ICD-10-CM

## 2022-08-17 DIAGNOSIS — F3181 Bipolar II disorder: Secondary | ICD-10-CM
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
## 2022-08-23 ENCOUNTER — Ambulatory Visit (INDEPENDENT_AMBULATORY_CARE_PROVIDER_SITE_OTHER): Payer: Medicare Other | Admitting: Clinical

## 2022-08-23 DIAGNOSIS — F411 Generalized anxiety disorder: Secondary | ICD-10-CM

## 2022-08-23 DIAGNOSIS — F603 Borderline personality disorder: Secondary | ICD-10-CM

## 2022-08-23 DIAGNOSIS — F3181 Bipolar II disorder: Secondary | ICD-10-CM

## 2022-08-24 ENCOUNTER — Encounter (HOSPITAL_COMMUNITY): Payer: Self-pay | Admitting: Clinical

## 2022-08-24 NOTE — Progress Notes (Signed)
THERAPIST PROGRESS NOTE  Session Time: 4:10pm-5:10pm  Participation Level: Active  Behavioral Response: Casual, Alert, Depressed and Hopeless  Type of Therapy: Individual Therapy  Treatment Goals addressed:  LTG: Kimberly Harper will score less than 5 on the Generalized Anxiety Disorder 7 Scale (GAD-7)  STG: Report feeling more positive about self and abilities during therapy sessions  LTG: Reduce frequency, intensity, and duration of depression symptoms so that daily functioning is improved  STG: Get 7-8 hours of restful sleep every night * LTG: Kimberly Harper will reduce frequency of impulsive behaviors as evidenced by self-report of cutting behaviors and relationship sabotage  STG: Kimberly Harper will identify at least 5 personal goals for emotion regulation  STG: Kimberly Harper will formulate a safety plan including warning signs, coping skills, and supports   ProgressTowards Goals: Not Progressing  Interventions: Solution Focused, Strength-based, and Assertiveness Training  Summary: Kimberly Harper is a 37 y.o. female who presents for therapy for her Borderline Personality Disorder, Generalized Anxiety Disorder, and Bipolar 2 (current episode depressive).  She states the last week has been up and down emotionally.  One major issue is that she is not sleeping, specifically wakes up every 1-2 hours and not getting enough sleep at any one time.  She said she typically only sleeps 4:30am-7:30am and may take a nap some evenings.  This is in large part due to her current schedule of working 8am-5pm in one job, going to school 5:30-9:00pm at [employer], and then working 12-3am in another job.  She also resists sleep because she does not want to get too comfortable because she has so much to do and she fears if she sleeps too much (more than 7 hours) she will become depressed, because in the past when she has been depressed she has slept all the time.   She does report staying on her phone when she goes to bed and was  encouraged to leave her phone out of reach and be protective of her sleep time.  She also decided to take a medical leave of absence from her second job for awhile, plus is contemplating whether she should stop taking the automotive classes until summertime.  She also shared that she stays this busy with jobs and school to avoid having to think about her many problems.  Therapist introduced her to the skill of Scheduling Worry Time and discussed how and why this might be effective.  She responded favorably and wanted to try it.  In her relationship she shared that she and boyfriend are "on" again currently and she saw him, stayed with him over the weekend; however, she was moody which makes the relationship difficult.  Her situation with mother living in her home has continued to deteriorate, as her mother will not use the actual restroom or even a portable potty chair, but instead urinates wherever she is sitting and lets it just stay there.  Patient now stays in her room almost continually while she is home because of the odor throughout her home.  She asked her mother to go to sister's house this weekend so she could do a deep clean and mother refused.  Therapist proposed some assertiveness skills for how to gain cooperation, reminding her that she herself has to be stable and well in order to provide for her mother's care.  She verbalized a willingness to try.    Prior to the end of session, patient shared that she thinks it is almost time to admit herself to the hospital "for a reset."  Therapist offered different ways she can look at what is bothering her and start to address them one at a time without needing to be hospitalized, but she was adamant that she cannot do this if she has access to her phone and is living in the home with uncooperative son and problematic mother.  Suicidal/Homicidal: No without intent/plan  Therapist Response: Therapist introduced the concept of cognitive behavioral therapy  and how our thoughts affect our feelings and behaviors.  This was demonstrated using the example of her thoughts about her mother's refusal to give her the house for the weekend to clean. She was not enthusiastic but allowed that we can start looking at cognitive distortions next session.  Therapist offered psychoeducation about sleep hygiene as well and it was met with resistance, due to her work/school schedule, her reliance on phone, and her fear of having to actually think about her life and existing problems.  She did warmly accept the idea of scheduling a daily worry time.  Rapport continues to develop.  Plan: Return again in 1 week  Diagnosis:  Borderline personality disorder ([place]) (F60.3) Generalized anxiety disorder (F41.1) Bipolar 2 disorder, major depressive episode ([place]) (F31.81)  Collaboration of Care: Psychiatrist AEB Secure chat was initiated with Dr. about upcoming 2/20 doctor appointment  and therapist's concerns  Patient/Guardian was advised Release of Information must be obtained prior to any record release in order to collaborate their care with an outside provider. Patient/Guardian was advised if they have not already done so to contact the registration department to sign all necessary forms in order for [place] to release information regarding their care.   Consent: Patient/Guardian gives verbal consent for treatment and assignment of benefits for services provided during this visit. Patient/Guardian expressed understanding and agreed to proceed.   Recommendations:  Return to therapy in 1 week, engage in self care behaviors, focus on overall work/home life balance, implement 1 new coping skill (Scheduling worry time) as taught in session, and return to next session prepared to talk about experience with that new coping method.   Maretta Los, LCSW 08/24/2022

## 2022-09-01 ENCOUNTER — Ambulatory Visit (HOSPITAL_COMMUNITY): Payer: Medicare Other | Admitting: Psychiatry

## 2022-09-01 ENCOUNTER — Other Ambulatory Visit: Payer: Self-pay | Admitting: Internal Medicine

## 2022-09-01 ENCOUNTER — Ambulatory Visit (INDEPENDENT_AMBULATORY_CARE_PROVIDER_SITE_OTHER): Payer: Medicare Other | Admitting: Clinical

## 2022-09-01 ENCOUNTER — Encounter (HOSPITAL_COMMUNITY): Payer: Self-pay | Admitting: Clinical

## 2022-09-01 DIAGNOSIS — F3181 Bipolar II disorder: Secondary | ICD-10-CM | POA: Diagnosis not present

## 2022-09-01 DIAGNOSIS — F411 Generalized anxiety disorder: Secondary | ICD-10-CM | POA: Diagnosis not present

## 2022-09-01 DIAGNOSIS — F603 Borderline personality disorder: Secondary | ICD-10-CM

## 2022-09-01 NOTE — Progress Notes (Signed)
THERAPIST PROGRESS NOTE  Session Time: 11:00am-11:55am  Session #3  Participation Level: Active  Behavioral Response: Casual, Lethargic, Depressed and Hopeless  Type of Therapy: Individual Therapy  Treatment Goals addressed:  LTG: Anida will score less than 5 on the Generalized Anxiety Disorder 7 Scale (GAD-7)  STG: Report feeling more positive about self and abilities during therapy sessions  LTG: Reduce frequency, intensity, and duration of depression symptoms so that daily functioning is improved  STG: Get 7-8 hours of restful sleep every night  LTG: Taimane will reduce frequency of impulsive behaviors as evidenced by self-report of cutting behaviors and relationship sabotage  STG: Layani will identify at least 5 personal goals for emotion regulation  STG: [place] will formulate a safety plan including warning signs, coping skills, and supports   ProgressTowards Goals: Not Progressing  Interventions: Motivational Interviewing, Strength-based, and Supportive  Summary: Kimberly Harper is a 37 y.o. female who presents for therapy for her Borderline Personality Disorder, Generalized Anxiety Disorder, and Bipolar 2 (current episode depressive).  During the last week her emotions have been up and down again, and she reports hitting some "dangerous points" where she has come close to self-harm in the form of cutting.  She did not do so only because she was "not in the right frame of mind so it would physically hurt."  She has felt some days she needed to go to the hospital and other days she has been fine, so she is conflicted about admitting herself, feels she does not meet criteria.  She was not able to deep-clean her house as desired because her mother refused to go to sister's; therefore, the house is smelling worse than ever.  She has tried to be assertive with mother, telling her to put her soiled clothing in plastic bags to contain the smell but instead her mother throws  the clothing on top of the bags.  Mother also is refusing to wash herself, will wear soiled items and will lay on them.  We discussed at length what she can control and what she cannot control in the situation.  Kimberly Harper is on a Leave of Absence from her second job and is requesting a letter in support of this, as they have requested such.  We discussed her current schooling situation, as she started immediately into another class after her final exam last week.  She expressed the frustrations experienced with her full-time job and again we focused on listing what she can control and what she cannot control in the situation.  In thinking about Valentine's Day coming up next week, Kimberly Harper feels angry, sad and scattered.  Her romantic relationship is "stuck" and she currently believes he is also seeing another person.  She was asked to list what is good about that relationship and what is not so good, which essentially boiled down to that he is sometimes attentive and helpful while much of the time he is doing the bare minimum to keep the relationship satisfying for her.  We made a list of what would be difficult or negative about letting that relationship so and what would be positive or helpful about letting him go.  She stated the good parts would be that she would no longer be on an emotional roller coaster, would be rid of "something that is going nowhere," would no longer have to worry about him/other women, and he would not be at least one trigger for her self-harm.  She was feeling disappointed, upset, and angry while this  discussion was going on.  Again, therapist encouraged her to focus on figuring out what she can control and what she cannot control  Suicidal/Homicidal: No without intent/plan  Therapist Response: Therapist used MI to address some of the distressing situations in patient's life currently including her mother, her job, and her relationship.  She was open to this and participated but  did not have any significant insight.  Her affect was fairly flat and depressed and it was obvious she was sad and tired.  She did state she is sleeping better since she is not working at night.  She gets about 7 hours of uninterrupted sleep.  Therapist encouraged patient to continue to think about which parts of her life she cannot control so that she can start working to accept them and stop suffering over them, while deciding which parts of her life she can in fact control so that she can start making some decisions about what to do moving forward in her life in a positive manner.  She verbalized a willingness to do this.  Therapist to look at the possibility of writing a letter regarding her Leave of Absence from her part-time job.  Plan: Return again in 1 week  Diagnosis:  Borderline personality disorder ([place]) (F60.3)  Generalized anxiety disorder (F41.1)  Bipolar 2 disorder, major depressive episode ([place]) (F31.81)   Collaboration of Care: Psychiatrist AEB confirmed upcoming doctor appointment with psychiatric provider on 2/20, has access to therapy notes    Patient/Guardian was advised Release of Information must be obtained prior to any record release in order to collaborate their care with an outside provider. Patient/Guardian was advised if they have not already done so to contact the registration department to sign all necessary forms in order for [place] to release information regarding their care.   Consent: Patient/Guardian gives verbal consent for treatment and assignment of benefits for services provided during this visit. Patient/Guardian expressed understanding and agreed to proceed.   Recommendations:  Return to therapy in 1 week, engage in self care behaviors, focus on overall work/home life balance, implement 1 new coping skill (Scheduling worry time) as taught in session, and return to next session prepared to talk about experience with that new coping method.   Maretta Los, LCSW 09/01/2022

## 2022-09-05 ENCOUNTER — Ambulatory Visit
Admission: EM | Admit: 2022-09-05 | Discharge: 2022-09-05 | Disposition: A | Discharge status: Home / Self Care | Payer: Medicare Other | Attending: Internal Medicine | Admitting: Internal Medicine

## 2022-09-05 DIAGNOSIS — N760 Acute vaginitis: Secondary | ICD-10-CM | POA: Insufficient documentation

## 2022-09-05 DIAGNOSIS — B9689 Other specified bacterial agents as the cause of diseases classified elsewhere: Secondary | ICD-10-CM | POA: Insufficient documentation

## 2022-09-05 DIAGNOSIS — B3731 Acute candidiasis of vulva and vagina: Secondary | ICD-10-CM | POA: Diagnosis not present

## 2022-09-05 LAB — POCT URINALYSIS DIP (MANUAL ENTRY)
Bilirubin, UA: NEGATIVE
Blood, UA: NEGATIVE
Glucose, UA: NEGATIVE mg/dL
Ketones, POC UA: NEGATIVE mg/dL
Leukocytes, UA: NEGATIVE
Nitrite, UA: NEGATIVE
Protein Ur, POC: NEGATIVE mg/dL
Spec Grav, UA: 1.02 (ref 1.010–1.025)
Urobilinogen, UA: 0.2 E.U./dL
pH, UA: 6.5 (ref 5.0–8.0)

## 2022-09-05 LAB — POCT URINE PREGNANCY: Preg Test, Ur: NEGATIVE

## 2022-09-05 MED ORDER — METRONIDAZOLE 500 MG PO TABS: 500.0000 mg | ORAL_TABLET | Freq: Two times a day (BID) | ORAL | 0 refills | Status: DC

## 2022-09-05 MED ORDER — FLUCONAZOLE 150 MG PO TABS: 150.0000 mg | ORAL_TABLET | ORAL | 0 refills | Status: DC

## 2022-09-05 NOTE — ED Provider Notes (Signed)
[facility]  Note:  This document was prepared using Systems analyst and may include unintentional dictation errors.  MRN: [id number] DOB: 11-21-1985  Subjective:   Kimberly Harper is a 37 y.o. female presenting for 2 day history of urinary frequency, urinary urgency, bilateral pelvic discomfort and cramping, 1 week history of vaginal discharge, malodorous discharge. She did douche. Denies fever, n/v, hematuria. Would like an UPT, STI screen. Has a history of BV, yeast infections. Drinks water regularly. Also drinks sweet, energy drinks daily, sometimes juices.   No current facility-administered medications for this encounter.  Current Outpatient Medications:    albuterol (VENTOLIN HFA) 108 (90 Base) MCG/ACT inhaler, Inhale 1-2 puffs into the lungs every 6 (six) hours as needed for wheezing or shortness of breath., Disp: 18 g, Rfl: 0   ALPRAZolam (XANAX) 1 MG tablet, Take 1 tablet (1 mg total) by mouth 2 (two) times daily as needed for anxiety., Disp: 15 tablet, Rfl: 0   cholecalciferol (VITAMIN D3) 25 MCG (1000 UNIT) tablet, Take 1,000 Units by mouth daily., Disp: , Rfl:    cyclobenzaprine (FLEXERIL) 5 MG tablet, Take 1 tablet (5 mg total) by mouth 3 (three) times daily as needed for muscle spasms., Disp: 30 tablet, Rfl: 1   naproxen (NAPROSYN) 500 MG tablet, TAKE 1 TABLET(500 MG) BY MOUTH TWICE DAILY WITH A MEAL, Disp: 30 tablet, Rfl: 0   pantoprazole (PROTONIX) 40 MG tablet, Take 1 tablet (40 mg total) by mouth daily., Disp: 90 tablet, Rfl: 1   pramipexole (MIRAPEX) 0.25 MG tablet, TAKE 1 TABLET(0.25 MG) BY MOUTH THREE TIMES DAILY, Disp: 270 tablet, Rfl: 0   promethazine-dextromethorphan (PROMETHAZINE-DM) 6.25-15 MG/5ML syrup, Take 2.5 mLs by mouth 3 (three) times daily as needed for cough., Disp: 100 mL, Rfl: 0   propranolol (INDERAL) 20 MG tablet, TAKE 1 TABLET(20 MG) BY MOUTH DAILY, Disp: 90 tablet, Rfl: 3   pseudoephedrine (SUDAFED) 60 MG  tablet, Take 1 tablet (60 mg total) by mouth every 8 (eight) hours as needed for congestion., Disp: 30 tablet, Rfl: 0   triamcinolone cream (KENALOG) 0.5 %, Apply 1 Application topically 2 (two) times daily., Disp: 15 g, Rfl: 0   Allergies  Allergen Reactions   Multiple Vitamins-Iron Swelling    States that vitamins, folic acid and iron have caused swelling of tongue and gums   Prednisone Other (See Comments)    Altered mental status    Past Medical History:  Diagnosis Date   Alcoholic ([place])    Anemia    [facility]reesia 01/17/2020   Anxiety    Asthma    Asthma 06/22/2021   Bipolar 1 disorder ([place])    Blood transfusion without reported diagnosis    [facility]reesia 01/17/2020   Chest pain 03/01/2022   03/2021 xr negative.   Chronic headaches    Depression    [facility]reesia 01/17/2020   Lactose intolerance 03/01/2022   Major depressive disorder    Nausea 03/01/2022   Pregnancy, location unknown 11/21/2021   Formatting of this note might be different from the original. [place] Language: English There is no such number on file (mobile). [phone] (home) [place] [id number] No e-mail address on record  Lab Hours [facility] (7th floor [place], [address]): M-F 7:30-18:00 [facility]: M-F 8:00-17:30 [facility]: M-F 8:30-17:00 (cl   Substance abuse [facility])    [place] 01/17/2020     Past Surgical History:  Procedure Laterality Date   CESAREAN SECTION N/A    Phreesia 01/17/2020   COLONOSCOPY     earlier in year 2022   DILATION AND CURETTAGE OF UTERUS     HAND SURGERY Right    WISDOM TOOTH EXTRACTION      Family History  Problem Relation Age of Onset   Hypertension Mother    Diabetes Mother    Stroke Mother    Depression Mother    Depression Sister    Irritable bowel syndrome Sister    Asthma Brother    Allergic rhinitis Brother    Migraines Brother    Depression Brother    Stroke Maternal Grandmother    Diabetes Maternal  Grandmother    Alcohol abuse Maternal Grandfather    Stroke Paternal Grandmother    Breast cancer Paternal Grandmother    Alcohol abuse Paternal Grandfather    Lupus Half-Sister    Angioedema Neg Hx    Eczema Neg Hx    Immunodeficiency Neg Hx    Atopy Neg Hx     Social History   Tobacco Use   Smoking status: Former    Types: Cigarettes    Quit date: 09/07/2006    Years since quitting: 16.0   Smokeless tobacco: Never  Vaping Use   Vaping Use: Never used  Substance Use Topics   Alcohol use: Yes    Comment: occ   Drug use: Not Currently    ROS   Objective:   Vitals: BP 129/78 (BP Location: Right Arm)   Pulse 60   Temp 98.9 F (37.2 C) (Oral)   Resp 16   LMP 08/05/2022   SpO2 98%   Physical Exam Constitutional:      General: She is not in acute distress.    Appearance: Normal appearance. She is well-developed. She is not ill-appearing, toxic-appearing or diaphoretic.  HENT:     Head: Normocephalic and atraumatic.     Nose: Nose normal.     Mouth/Throat:     Mouth: Mucous membranes are moist.  Eyes:     General: No scleral icterus.       Right eye: No discharge.        Left eye: No discharge.     Extraocular Movements: Extraocular movements intact.     Conjunctiva/sclera: Conjunctivae normal.  Cardiovascular:     Rate and Rhythm: Normal rate.  Pulmonary:     Effort: Pulmonary effort is normal.  Abdominal:     General: Bowel sounds are normal. There is no distension.     Palpations: Abdomen is soft. There is no mass.     Tenderness: There is abdominal tenderness in the suprapubic area. There is no right CVA tenderness, left CVA tenderness, guarding or rebound.  Skin:    General: Skin is warm and dry.  Neurological:     General: No focal deficit present.     Mental Status: She is alert and oriented to person, place, and time.  Psychiatric:        Mood and Affect: Mood normal.        Behavior: Behavior normal.        Thought Content: Thought content  normal.        Judgment: Judgment normal.    Results for orders placed or performed during the hospital encounter of 09/05/22 (from the past 24 hour(s))  POCT urine pregnancy     Status: None   Collection Time: 09/05/22 10:24 AM  Result Value Ref Range   Preg  Test, Ur Negative Negative  POCT urinalysis dipstick     Status: None   Collection Time: 09/05/22 10:24 AM  Result Value Ref Range   Color, UA yellow yellow   Clarity, UA clear clear   Glucose, UA negative negative mg/dL   Bilirubin, UA negative negative   Ketones, POC UA negative negative mg/dL   Spec Grav, UA 1.020 1.010 - 1.025   Blood, UA negative negative   pH, UA 6.5 5.0 - 8.0   Protein Ur, POC negative negative mg/dL   Urobilinogen, UA 0.2 0.2 or 1.0 E.U./dL   Nitrite, UA Negative Negative   Leukocytes, UA Negative Negative    Assessment and Plan :   PDMP not reviewed this encounter.  1. Bacterial vaginosis   2. Yeast vaginitis     Advised that she avoid any urinary irritants including the sweet tea and energy drinks.  No sign of UTI, will defer urine culture given her completely normal urinalysis.  We will treat patient empirically for recurrent bacterial vaginosis with Flagyl and for yeast vaginitis with fluconazole.  Labs pending. Counseled patient on potential for adverse effects with medications prescribed/recommended today, ER and return-to-clinic precautions discussed, patient verbalized understanding.    Jaynee Eagles, [place] 09/05/22 1035

## 2022-09-05 NOTE — ED Triage Notes (Signed)
Pt c/o vaginal d/c x 1 week-requesting STD screening and upreg-NAD-steady gait

## 2022-09-06 LAB — CERVICOVAGINAL ANCILLARY ONLY
Bacterial Vaginitis (gardnerella): POSITIVE — AB
Candida Glabrata: NEGATIVE
Candida Vaginitis: NEGATIVE
Chlamydia: NEGATIVE
Comment: NEGATIVE
Comment: NEGATIVE
Comment: NEGATIVE
Comment: NEGATIVE
Comment: NEGATIVE
Comment: NORMAL
Neisseria Gonorrhea: NEGATIVE
Trichomonas: NEGATIVE

## 2022-09-12 ENCOUNTER — Ambulatory Visit (HOSPITAL_BASED_OUTPATIENT_CLINIC_OR_DEPARTMENT_OTHER): Payer: Medicare Other | Admitting: Psychiatry

## 2022-09-12 ENCOUNTER — Telehealth: Payer: Self-pay

## 2022-09-12 ENCOUNTER — Encounter (HOSPITAL_COMMUNITY): Payer: Self-pay | Admitting: Psychiatry

## 2022-09-12 DIAGNOSIS — F411 Generalized anxiety disorder: Secondary | ICD-10-CM | POA: Diagnosis not present

## 2022-09-12 DIAGNOSIS — F603 Borderline personality disorder: Secondary | ICD-10-CM | POA: Diagnosis not present

## 2022-09-12 DIAGNOSIS — F3181 Bipolar II disorder: Secondary | ICD-10-CM

## 2022-09-12 MED ORDER — PRAMIPEXOLE DIHYDROCHLORIDE 0.25 MG PO TABS: 0.2500 mg | ORAL_TABLET | Freq: Three times a day (TID) | ORAL | 0 refills | Status: DC

## 2022-09-12 MED ORDER — PROPRANOLOL HCL 10 MG PO TABS: 10.0000 mg | ORAL_TABLET | Freq: Two times a day (BID) | ORAL | 1 refills | Status: DC | PRN
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
## 2022-09-12 NOTE — Telephone Encounter (Signed)
Fertility/family planning

## 2022-09-13 ENCOUNTER — Encounter (HOSPITAL_COMMUNITY): Payer: Self-pay | Admitting: Clinical

## 2022-09-13 ENCOUNTER — Ambulatory Visit (INDEPENDENT_AMBULATORY_CARE_PROVIDER_SITE_OTHER): Payer: Medicare Other | Admitting: Clinical

## 2022-09-13 ENCOUNTER — Other Ambulatory Visit (HOSPITAL_COMMUNITY): Payer: Self-pay | Admitting: Psychiatry

## 2022-09-13 DIAGNOSIS — F603 Borderline personality disorder: Secondary | ICD-10-CM | POA: Diagnosis not present

## 2022-09-13 DIAGNOSIS — F411 Generalized anxiety disorder: Secondary | ICD-10-CM

## 2022-09-13 DIAGNOSIS — F3181 Bipolar II disorder: Secondary | ICD-10-CM
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
## 2022-09-18 ENCOUNTER — Ambulatory Visit (INDEPENDENT_AMBULATORY_CARE_PROVIDER_SITE_OTHER): Payer: Medicare Other | Admitting: Clinical

## 2022-09-18 DIAGNOSIS — F3181 Bipolar II disorder: Secondary | ICD-10-CM

## 2022-09-18 DIAGNOSIS — F603 Borderline personality disorder: Secondary | ICD-10-CM | POA: Diagnosis not present

## 2022-09-18 DIAGNOSIS — F411 Generalized anxiety disorder: Secondary | ICD-10-CM

## 2022-09-19 ENCOUNTER — Encounter (HOSPITAL_COMMUNITY): Payer: Self-pay | Admitting: Clinical

## 2022-09-19 NOTE — Progress Notes (Signed)
THERAPIST PROGRESS NOTE  Session Time: 3:15-4:00pm  Session #5  Participation Level: Active  Behavioral Response: Casual, Alert, Tearful  Type of Therapy: Individual Therapy  Treatment Goals addressed:  LTG: Kimberly Harper will score less than 5 on the Generalized Anxiety Disorder 7 Scale (GAD-7)  STG: Report feeling more positive about self and abilities during therapy sessions  LTG: Reduce frequency, intensity, and duration of depression symptoms so that daily functioning is improved  STG: Get 7-8 hours of restful sleep every night  LTG: Kimberly Harper will reduce frequency of impulsive behaviors as evidenced by self-report of cutting behaviors and relationship sabotage  STG: Kimberly Harper will identify at least 5 personal goals for emotion regulation  STG: Kimberly Harper will formulate a safety plan including warning signs, coping skills, and supports   ProgressTowards Goals: Progressing  Interventions: Strength-based, Psychosocial Skills: Boundary setting, and Supportive  Summary: Kimberly Harper is a 37 y.o. female who presents for therapy for her Borderline Personality Disorder, Generalized Anxiety Disorder, and Bipolar 2 (current episode depressive).  She was dressed casually, said she called out of work today to take her mother to a doctor's appointment which mother later refused to attend.  She was at her boyfriend's house and had a panic attack, removed herself to the bathroom because she does not like people seeing her like that.  They have been spending quality time despite a series of angry incidents, particularly on her part where she gets angry when she is not invited to events with his friends or he does not call her when she is expecting it.  He had given her a compliment at some point so she wanted to see him for just a minute, but he did not have time to see her which angered her.  They have been talking about moving in together and about meeting each other's families, but she questions  whether any of this is "real."  She was able to identify the emotion of fear that comes from the fact that she has previously been disappointed.    CSW did ask her directly about her cutting behaviors and she stated she has in fact been cutting.  This is usually under the circumstances of people not understanding her or acting in such a way that she thinks they do not care about how hard she is trying or all that she goes through. She reported that there are times she thinks "I'll hurt myself more than you ever could" and time she feels that she deserves the pain.  She is frustrated currently at school by the professor's talk about failing her if she misses any more classes.  She has missed a number due to car issues, not feeling well, and such.  She feels it is unfair for him to threaten to punish her for things she did not plan.  She verbalized her anger at him which seemed to help her determine to finish out the class.  At one point she was stating that she did not know what she was feeling, so an emotion wheel was used to narrow it down.  She decided that she was feeling fear, specifically insecurity, panicked and worried.  She was open to the idea of using such a tool in her daily life to help identify what she is experiencing since she so often does not know.  When asked about how she is handling things with her mother, she was irritated that her mother refused to go to the eye doctor, stating that if her  mother loses her sight she will feel very guilty that she did not force her mother to go.  We spent a good deal of time talking about her right to set boundaries with her mother, such as "You are welcome to live here, mother, as long as you attend your doctor appointments and follow the recommendations of your doctors."  She is not anywhere near being able to consider such boundaries with her mother, despite the smell in the house and mother's refusal to do things in any way that patient  asks.  Suicidal/Homicidal: No without intent/plan  Therapist Response: Therapist noted after last session to "talk more about determining areas of control,  thinking traps and putting thoughts on trial, cutting behaviors and a safety plan."  Patient was late to session so not all could be covered.  CSW did confront her about the cutting behavior and she asserted that even though she has cut herself recently and did not tell therapist, she has not done it again since that time  She is make mild progress in being willing to identify her feelings rather than just talk about how hard her various life situations are.  She is in great need of setting boundaries with her mother and son, but is resistant to the idea of doing this, indicating that the pain of their actions is not sufficient right now to make her change.  Plan: Return again in 1 week  Recommendations:  Return to therapy in 1 week, engage in self care behaviors, consider boundaries with mother to be put in place  Diagnosis:  Name Primary?   Bipolar 2 disorder, major depressive episode (HCC) (F31.81) Yes   Borderline personality disorder ([place]) (F60.3)    GAD (generalized anxiety disorder) (F41.1      Collaboration of Care: Psychiatrist AEB - therapist and psychiatric provider can read notes in Epic    Patient/Guardian was advised Release of Information must be obtained prior to any record release in order to collaborate their care with an outside provider. Patient/Guardian was advised if they have not already done so to contact the registration department to sign all necessary forms in order for [place] to release information regarding their care.   Consent: Patient/Guardian gives verbal consent for treatment and assignment of benefits for services provided during this visit. Patient/Guardian expressed understanding and agreed to proceed.    Maretta Los, LCSW 09/19/2022

## 2022-09-25 ENCOUNTER — Ambulatory Visit (HOSPITAL_COMMUNITY): Payer: Medicare Other | Admitting: Clinical

## 2022-09-27 ENCOUNTER — Ambulatory Visit (INDEPENDENT_AMBULATORY_CARE_PROVIDER_SITE_OTHER): Payer: Medicare Other | Admitting: Family Medicine

## 2022-09-27 ENCOUNTER — Encounter: Payer: Self-pay | Admitting: Family Medicine

## 2022-09-27 ENCOUNTER — Other Ambulatory Visit (HOSPITAL_COMMUNITY)
Admission: RE | Admit: 2022-09-27 | Discharge: 2022-09-27 | Disposition: A | Discharge status: Home / Self Care | Payer: Medicare Other | Source: Ambulatory Visit | Attending: Family Medicine | Admitting: Family Medicine

## 2022-09-27 VITALS — BP 114/72 | HR 62 | Ht 65.0 in | Wt 134.0 lb

## 2022-09-27 DIAGNOSIS — R102 Pelvic and perineal pain: Secondary | ICD-10-CM | POA: Diagnosis not present

## 2022-09-27 DIAGNOSIS — N96 Recurrent pregnancy loss: Secondary | ICD-10-CM

## 2022-09-27 DIAGNOSIS — Z1151 Encounter for screening for human papillomavirus (HPV): Secondary | ICD-10-CM | POA: Diagnosis not present

## 2022-09-27 DIAGNOSIS — Z01419 Encounter for gynecological examination (general) (routine) without abnormal findings: Secondary | ICD-10-CM | POA: Diagnosis not present

## 2022-09-27 LAB — POCT URINE PREGNANCY: Preg Test, Ur: NEGATIVE

## 2022-09-27 NOTE — Progress Notes (Signed)
Pt reports having cervical pain and cramping. Pt is having pain with intercourse. She rates the pain as 10/10 at the worst. Denies any abnormal discharge or odor. Pt also reports she is trying to get pregnant. No other concerns at this time.

## 2022-09-28 ENCOUNTER — Ambulatory Visit (INDEPENDENT_AMBULATORY_CARE_PROVIDER_SITE_OTHER): Payer: Medicare Other | Admitting: Clinical

## 2022-09-28 DIAGNOSIS — F3181 Bipolar II disorder: Secondary | ICD-10-CM | POA: Diagnosis not present

## 2022-09-28 DIAGNOSIS — F603 Borderline personality disorder: Secondary | ICD-10-CM

## 2022-09-28 DIAGNOSIS — F411 Generalized anxiety disorder: Secondary | ICD-10-CM

## 2022-09-28 LAB — CERVICOVAGINAL ANCILLARY ONLY
Bacterial Vaginitis (gardnerella): NEGATIVE
Candida Glabrata: NEGATIVE
Candida Vaginitis: POSITIVE — AB
Chlamydia: NEGATIVE
Comment: NEGATIVE
Comment: NEGATIVE
Comment: NEGATIVE
Comment: NEGATIVE
Comment: NEGATIVE
Comment: NORMAL
Neisseria Gonorrhea: NEGATIVE
Trichomonas: NEGATIVE
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
## 2022-09-28 NOTE — Progress Notes (Signed)
    SUBJECTIVE:   CHIEF COMPLAINT / HPI:   Pelvic pain Patient presenting for evaluation for right and left lower quadrant pain as well as suprapubic pain.  She was seen presenting similar to this in August as well as November of last year.  Reports that the pain comes and goes.  She is very upset because she reports that she had a miscarriage last year and they have been trying to get pregnant since this and have not been successful.  Regarding the pain she reports that it comes and goes and is usually worse in the left lower quadrant.  Denies any known triggers other than palpation.  Reports chronic issues with constipation and does not take anything for this.   OBJECTIVE:   BP 114/72   Pulse 62   Ht '5\' 5"'$  (1.651 m)   Wt 134 lb (60.8 kg)   LMP 09/06/2022 (Exact Date)   BMI 22.30 kg/m   General: Pleasant but sometimes tearful 37 year old female in no acute distress Cardiac: Regular rate Respiratory: Normal for breathing, speaking in full sentences Abdomen: Soft, tenderness to palpation right lower quadrant as well as left lower quadrant but worse on the right side than the left.  No organomegaly, no palpable masses GU: Normal external female genitalia, normal vaginal discharge with normal vaginal tissue, Pap smear collected, nonfriable cervix.  ASSESSMENT/PLAN:   Vaginal pain This appears to be a chronic issue.  Ultrasound was ordered previously but she never had it done.  Will reschedule ultrasound.  Pap smear collected as well as wet prep and STD screening.  Discussed the use of laxatives to help with constipation which may be contributing to the pain.  Follow-up as needed.   Concepcion Living, MD [place]

## 2022-09-28 NOTE — Assessment & Plan Note (Addendum)
This appears to be a chronic issue.  Ultrasound was ordered previously but she never had it done.  Will reschedule ultrasound.  Pap smear collected as well as wet prep and STD screening.  Discussed the use of laxatives to help with constipation which may be contributing to the pain.  Follow-up as needed.

## 2022-09-29 ENCOUNTER — Telehealth: Payer: Self-pay

## 2022-09-29 NOTE — Telephone Encounter (Signed)
Called patient to schedule Medicare Annual Wellness Visit (AWV). Left message for patient to call back and schedule Medicare Annual Wellness Visit (AWV).  Last date of AWV: eligible 04/23/22 for AWV-I  Please schedule an appointment at any time with Health Coach.    Norton Blizzard, [place] ([facility]AMA)  [place] [facility] [phone]

## 2022-10-02 ENCOUNTER — Telehealth: Payer: Self-pay | Admitting: Family Medicine

## 2022-10-02 MED ORDER — FLUCONAZOLE 150 MG PO TABS: 150.0000 mg | ORAL_TABLET | Freq: Once | ORAL | 0 refills | Status: AC

## 2022-10-02 NOTE — Telephone Encounter (Signed)
Spoke with patient about yeast infection. Diflucan sent to patients pharmacy.

## 2022-10-03 LAB — CYTOLOGY - PAP
Comment: NEGATIVE
Diagnosis: NEGATIVE
High risk HPV: NEGATIVE

## 2022-10-05 ENCOUNTER — Ambulatory Visit (HOSPITAL_COMMUNITY): Payer: Medicare Other

## 2022-10-12 ENCOUNTER — Telehealth (HOSPITAL_COMMUNITY): Payer: Medicare Other | Admitting: Psychiatry

## 2022-10-13 ENCOUNTER — Encounter (HOSPITAL_COMMUNITY): Payer: Self-pay

## 2022-10-13 ENCOUNTER — Encounter (HOSPITAL_COMMUNITY): Payer: Medicare Other | Admitting: Psychiatry

## 2022-10-13 NOTE — Progress Notes (Signed)
This encounter was created in error - please disregard.

## 2022-10-16 ENCOUNTER — Telehealth: Payer: Self-pay | Admitting: Internal Medicine

## 2022-10-16 NOTE — Telephone Encounter (Signed)
Copied from [place] [id number]. Topic: Medicare AWV >> Oct 16, 2022  1:32 PM Gillis Santa wrote: Reason for CRM: Called patient to schedule Medicare Annual Wellness Visit (AWV). Left message for patient to call back and schedule Medicare Annual Wellness Visit (AWV).  Last date of AWV: N/A  Please schedule an appointment at any time with Otila Kluver, [facility]. Please schedule AWVS with Otila Kluver, [place].  If any questions, please contact me at [phone].  Thank you ,  Shaune Pollack [facility] AWV TEAM Direct Dial [phone]

## 2022-10-18 ENCOUNTER — Ambulatory Visit
Admission: RE | Admit: 2022-10-18 | Discharge: 2022-10-18 | Disposition: A | Discharge status: Home / Self Care | Payer: Medicare Other | Source: Ambulatory Visit | Attending: Nurse Practitioner | Admitting: Nurse Practitioner

## 2022-10-18 VITALS — BP 122/87 | HR 58 | Resp 17

## 2022-10-18 DIAGNOSIS — N898 Other specified noninflammatory disorders of vagina: Secondary | ICD-10-CM | POA: Insufficient documentation

## 2022-10-18 MED ORDER — METRONIDAZOLE 500 MG PO TABS: 500.0000 mg | ORAL_TABLET | Freq: Two times a day (BID) | ORAL | 0 refills | Status: DC

## 2022-10-18 MED ORDER — FLUCONAZOLE 150 MG PO TABS: 150.0000 mg | ORAL_TABLET | Freq: Every day | ORAL | 0 refills | Status: DC

## 2022-10-18 NOTE — Discharge Instructions (Signed)
The clinic will contact you with results of the testing done today if positive Follow-up with your gynecologist if symptoms do not improve Please go to the ER for any worsening symptoms

## 2022-10-18 NOTE — ED Provider Notes (Signed)
[facility]    CSN: [id number] Arrival date & time: 10/18/22  1609      History   Chief Complaint No chief complaint on file.   HPI Kimberly Harper is a 37 y.o. female presents for evaluation of vaginal odor.  Patient reports 5 days of a vaginal odor consistent with previous BV infections.  She states she gets a BV infection after every menstrual cycle.  She denies any vaginal discharge, dysuria, STD concern or exposure.  She states she has followed up with gynecology and there is no reason for her repeat infections that they have been able to find.  No OTC medications have been used.  No other concerns at this time.  HPI  Past Medical History:  Diagnosis Date   Alcoholic ([place])    Anemia    [employer]reesia 01/17/2020   Anxiety    Asthma    Asthma 06/22/2021   Bipolar 1 disorder ([place])    Blood transfusion without reported diagnosis    [employer]reesia 01/17/2020   Chest pain 03/01/2022   03/2021 xr negative.   Chronic headaches    Depression    [employer]reesia 01/17/2020   Lactose intolerance 03/01/2022   Major depressive disorder    Nausea 03/01/2022   Pregnancy, location unknown 11/21/2021   Formatting of this note might be different from the original. [place] Language: English There is no such number on file (mobile). [phone] (home) [place] [id number] No e-mail address on record  Lab Hours [facility] (7th floor [place], [address]): M-F 7:30-18:00 [facility]: M-F 8:00-17:30 [facility]: M-F 8:30-17:00 (cl   Substance abuse ([place])    [place] 01/17/2020    Patient Active Problem List   Diagnosis Date Noted   Chest injuries 07/20/2022   Vaginal pain 06/13/2022   Abnormal uterine bleeding (AUB) 03/13/2022   Nausea 03/01/2022   Chest pain 03/01/2022   Lactose intolerance 03/01/2022   Rash 03/01/2022   Epigastric pain 02/08/2022   Other allergic rhinitis 06/22/2021   Heartburn 06/22/2021   Panic  disorder 04/12/2020   GAD (generalized anxiety disorder) 03/18/2020   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   Borderline personality disorder ([place]) 02/06/2020   Bipolar 2 disorder, major depressive episode ([place]) 12/06/2019    Past Surgical History:  Procedure Laterality Date   CESAREAN SECTION N/A    Phreesia 01/17/2020   COLONOSCOPY     earlier in year 2022   DILATION AND CURETTAGE OF UTERUS     HAND SURGERY Right    WISDOM TOOTH EXTRACTION      OB History     Gravida  4   Para  1   Term      Preterm  1   AB  3   Living  1      SAB  3   IAB      Ectopic      Multiple      Live Births  1            Home Medications    Prior to Admission medications   Medication Sig Start Date End Date Taking? Authorizing Provider  fluconazole (DIFLUCAN) 150 MG tablet Take 1 tablet (150 mg total) by mouth daily. Take 1 tablet once and repeat in 3 days if symptoms persist 10/18/22  Yes Melynda Ripple, NP  metroNIDAZOLE (FLAGYL) 500 MG tablet Take 1 tablet (500 mg total) by mouth 2 (two) times  daily. 10/18/22  Yes Melynda Ripple, NP  albuterol (VENTOLIN HFA) 108 (90 Base) MCG/ACT inhaler Inhale 1-2 puffs into the lungs every 6 (six) hours as needed for wheezing or shortness of breath. 06/08/22   Jaynee Eagles, PA-C  ALPRAZolam Duanne Moron) 1 MG tablet Take 1 tablet (1 mg total) by mouth 2 (two) times daily as needed for anxiety. 07/20/22   Loralee Pacas, MD  cholecalciferol (VITAMIN D3) 25 MCG (1000 UNIT) tablet Take 1,000 Units by mouth daily.    [provider]  cyclobenzaprine (FLEXERIL) 5 MG tablet Take 1 tablet (5 mg total) by mouth 3 (three) times daily as needed for muscle spasms. 07/20/22   Loralee Pacas, MD  naproxen (NAPROSYN) 500 MG tablet TAKE 1 TABLET(500 MG) BY MOUTH TWICE DAILY WITH A MEAL 08/01/22   Loralee Pacas, MD  pantoprazole (PROTONIX) 40 MG tablet Take 1 tablet (40 mg total) by mouth daily. 07/20/22   Loralee Pacas, MD  pramipexole (MIRAPEX) 0.25 MG  tablet Take 1 tablet (0.25 mg total) by mouth 3 (three) times daily. 09/12/22   Vista Mink, MD  promethazine-dextromethorphan (PROMETHAZINE-DM) 6.25-15 MG/5ML syrup Take 2.5 mLs by mouth 3 (three) times daily as needed for cough. 06/08/22   Jaynee Eagles, PA-C  propranolol (INDERAL) 10 MG tablet Take 1 tablet (10 mg total) by mouth 2 (two) times daily as needed (anixety). Take 4-6 hours after the 20 mg dose at the earliest 09/12/22   Vista Mink, MD  propranolol (INDERAL) 20 MG tablet TAKE 1 TABLET(20 MG) BY MOUTH DAILY 07/20/22   Loralee Pacas, MD  pseudoephedrine (SUDAFED) 60 MG tablet Take 1 tablet (60 mg total) by mouth every 8 (eight) hours as needed for congestion. 06/08/22   Jaynee Eagles, PA-C  triamcinolone cream (KENALOG) 0.5 % Apply 1 Application topically 2 (two) times daily. 08/13/22   Francene Finders, PA-C    Family History Family History  Problem Relation Age of Onset   Hypertension Mother    Diabetes Mother    Stroke Mother    Depression Mother    Depression Sister    Irritable bowel syndrome Sister    Asthma Brother    Allergic rhinitis Brother    Migraines Brother    Depression Brother    Stroke Maternal Grandmother    Diabetes Maternal Grandmother    Alcohol abuse Maternal Grandfather    Stroke Paternal Grandmother    Breast cancer Paternal Grandmother    Alcohol abuse Paternal Grandfather    Lupus Half-Sister    Angioedema Neg Hx    Eczema Neg Hx    Immunodeficiency Neg Hx    Atopy Neg Hx     Social History Social History   Tobacco Use   Smoking status: Former    Types: Cigarettes    Quit date: 09/07/2006    Years since quitting: 16.1   Smokeless tobacco: Never  Vaping Use   Vaping Use: Never used  Substance Use Topics   Alcohol use: Yes    Comment: occ   Drug use: Not Currently     Allergies   Multiple vitamins-iron and Prednisone   Review of Systems Review of Systems  Genitourinary:        Vaginal odor     Physical Exam Triage  Vital Signs ED Triage Vitals  Enc Vitals Group     BP 10/18/22 1617 122/87     Pulse Rate 10/18/22 1617 (!) 58     Resp 10/18/22 1617  17     Temp --      Temp Source 10/18/22 1617 Oral     SpO2 10/18/22 1617 98 %     Weight --      Height --      Head Circumference --      Peak Flow --      Pain Score 10/18/22 1616 0     Pain Loc --      Pain Edu? --      Excl. in [place]? --    No data found.  Updated Vital Signs BP 122/87 (BP Location: Right Arm)   Pulse (!) 58   Resp 17   LMP 10/04/2022 (Exact Date)   SpO2 98%   Visual Acuity Right Eye Distance:   Left Eye Distance:   Bilateral Distance:    Right Eye Near:   Left Eye Near:    Bilateral Near:     Physical Exam Vitals and nursing note reviewed.  Constitutional:      Appearance: Normal appearance.  HENT:     Head: Normocephalic and atraumatic.  Eyes:     Pupils: Pupils are equal, round, and reactive to light.  Cardiovascular:     Rate and Rhythm: Normal rate.  Pulmonary:     Effort: Pulmonary effort is normal.  Abdominal:     Tenderness: There is no right CVA tenderness or left CVA tenderness.  Skin:    General: Skin is warm and dry.  Neurological:     General: No focal deficit present.     Mental Status: She is alert and oriented to person, place, and time.  Psychiatric:        Mood and Affect: Mood normal.        Behavior: Behavior normal.      UC Treatments / Results  Labs (all labs ordered are listed, but only abnormal results are displayed) Labs Reviewed  CERVICOVAGINAL ANCILLARY ONLY    EKG   Radiology No results found.  Procedures Procedures (including critical care time)  Medications Ordered in UC Medications - No data to display  Initial Impression / Assessment and Plan / UC Course  I have reviewed the triage vital signs and the nursing notes.  Pertinent labs & imaging results that were available during my care of the patient were reviewed by me and considered in my medical  decision making (see chart for details).     Testing as ordered above and will contact for any positive results Start metronidazole for likely BV infection.  Side effect profile reviewed. Diflucan prescribed as patient reports antibiotic induced yeast infections Advised gynecology follow-up if symptoms persist ER precautions reviewed and patient verbalized understanding Final Clinical Impressions(s) / UC Diagnoses   Final diagnoses:  Vaginal odor     Discharge Instructions      The clinic will contact you with results of the testing done today if positive Follow-up with your gynecologist if symptoms do not improve Please go to the ER for any worsening symptoms   ED Prescriptions     Medication Sig Dispense Auth. Provider   fluconazole (DIFLUCAN) 150 MG tablet Take 1 tablet (150 mg total) by mouth daily. Take 1 tablet once and repeat in 3 days if symptoms persist 2 tablet Melynda Ripple, NP   metroNIDAZOLE (FLAGYL) 500 MG tablet Take 1 tablet (500 mg total) by mouth 2 (two) times daily. 14 tablet Melynda Ripple, NP      PDMP not reviewed this encounter.  Melynda Ripple, NP 10/18/22 [phone]

## 2022-10-18 NOTE — ED Triage Notes (Signed)
Pt presents with c/o vaginal issues. States she has BV after her cycle.

## 2022-10-19 ENCOUNTER — Ambulatory Visit (HOSPITAL_COMMUNITY)
Admission: RE | Admit: 2022-10-19 | Discharge: 2022-10-19 | Disposition: A | Discharge status: Home / Self Care | Payer: Medicare Other | Source: Ambulatory Visit | Attending: Family Medicine | Admitting: Family Medicine

## 2022-10-19 DIAGNOSIS — R102 Pelvic and perineal pain: Secondary | ICD-10-CM | POA: Diagnosis present

## 2022-10-19 LAB — CERVICOVAGINAL ANCILLARY ONLY
Bacterial Vaginitis (gardnerella): POSITIVE — AB
Candida Glabrata: NEGATIVE
Candida Vaginitis: NEGATIVE
Chlamydia: NEGATIVE
Comment: NEGATIVE
Comment: NEGATIVE
Comment: NEGATIVE
Comment: NEGATIVE
Comment: NEGATIVE
Comment: NORMAL
Neisseria Gonorrhea: NEGATIVE
Trichomonas: NEGATIVE

## 2022-10-26 ENCOUNTER — Telehealth: Payer: Self-pay | Admitting: Internal Medicine

## 2022-10-26 NOTE — Telephone Encounter (Signed)
Copied from [place] [id number]. Topic: Medicare AWV >> Oct 26, 2022 11:48 AM Gillis Santa wrote: Reason for CRM: Called patient to schedule Medicare Annual Wellness Visit (AWV). Left message for patient to call back and schedule Medicare Annual Wellness Visit (AWV).  Last date of AWV: N/A  Please schedule an appointment at any time with Otila Kluver, [facility]. Please schedule AWVS with Otila Kluver, [place].  If any questions, please contact me at [phone].  Thank you ,  Shaune Pollack [facility] AWV TEAM Direct Dial [phone]

## 2022-11-01 ENCOUNTER — Encounter (HOSPITAL_COMMUNITY): Payer: Self-pay | Admitting: Psychiatry

## 2022-11-01 ENCOUNTER — Telehealth (HOSPITAL_BASED_OUTPATIENT_CLINIC_OR_DEPARTMENT_OTHER): Payer: Medicare Other | Admitting: Psychiatry

## 2022-11-01 DIAGNOSIS — F3181 Bipolar II disorder: Secondary | ICD-10-CM | POA: Diagnosis not present

## 2022-11-01 DIAGNOSIS — F411 Generalized anxiety disorder: Secondary | ICD-10-CM

## 2022-11-01 DIAGNOSIS — F603 Borderline personality disorder: Secondary | ICD-10-CM

## 2022-11-01 MED ORDER — PROMETHAZINE HCL 50 MG PO TABS
50.0000 mg | ORAL_TABLET | Freq: Three times a day (TID) | ORAL | 0 refills | Status: DC | PRN
Start: 2022-11-01 — End: 2023-02-09

## 2022-11-01 NOTE — Progress Notes (Signed)
BH MD/PA/NP OP Progress Note  11/01/2022 7:20 PM Kimberly Harper  MRN:  [id number]  Visit Diagnosis:    ICD-10-CM   1. Bipolar 2 disorder, major depressive episode  F31.81     2. GAD (generalized anxiety disorder)  F41.1 promethazine (PHENERGAN) 50 MG tablet    3. Borderline personality disorder  F60.3 promethazine (PHENERGAN) 50 MG tablet      Assessment: Kimberly Harper is a 37 y.o. female with a history of borderline personality disorder, Bipolar 2 disorder, PTSD,  GAD, and panic disorder who [facility] on 09/12/2022.  At initial evaluation patient reported symptoms of increased anxiety, fatigue, tearfulness, negative self thoughts, worthlessness, nonsuicidal self-injury, and intermittent SI without intent or plan.  Safety planning was discussed as well as alternative means to try manage nonsuicidal self-injury.  Patient had a significant past history of bipolar 2 disorder endorsed episodes of hypomania in the past which can include decreased sleep, increased energy, reckless and impulsive behaviors, and excessively elevated mood.  These episodes can last 1 to 3 days.  She denied any episodes of full mania.  Of note patient was also screened for borderline personality disorder which she has been diagnosed with in the past and did endorse real or perceived feelings of abandonment, chronic feelings of emptiness, splitting, impulsive behaviors, difficulty with interpersonal relationships, poor sense of self, and NSSI.  Patient met criteria for bipolar 2 disorder, borderline personality disorder, and generalized anxiety disorder.  While she does have a significant past history of trauma and some traits of PTSD she does not have enough to meet criteria.  Treatment options were discussed however patient notes trying a number of medications in the past with poor or sometimes negative effect including nausea,  excessive sedation, or increased suicidality.  She was not interested in starting a mood stabilizer at this time.  Due to the bipolar 2 diagnosis we would not start an antidepressant medication without some mood stabilization.  Patient was open to as needed medication for anxiety.   Kimberly Harper presents for follow-up evaluation. Today, 11/01/22, patient reports continued symptoms of mood lability often related to interpersonal stressors.  She did have an episode of self-harm a week and a half ago.  Patient denies any SI at this time.  Patient did not start the propranolol 10 mg in the interim due to not getting it from the pharmacy.  The pharmacy was contacted and they confirmed that the prescription is available be picked up whenever.  Patient also reported that she had decreased pramipexole to once a day as she had stopped the medication when she drank alcohol.  Upon resuming she found the medication made her excessively nauseous and can only tolerate once a day now.  We agreed to start as needed Phenergan for nausea and encouraged patient to follow up with her PCP.  She also did disclose some interest in pregnancy and we reviewed risk of Xanax and propranolol use in pregnancy.  Plan: - Continue Xanax 0.25 mg QD prn for anxiety, PDMP reviewed, last filled on 07/21/27  - Decrease pramipexole to 0.25 mg QD due to nausea  - Started Phenergan 5 mg Q8H prn for nausea, recommend f/u with PCP for continued management - Continue Propranolol 20 mg QD for migraines - Start Propranolol 10 mg BID prn for anxiety - CMP, Beta HCG reviewed - Continue therapy with Ambrose MantleMareida Grossman-Orr - Recommend DBT - Will discuss LOA letter with  patients therapist - Crisis resources reviewed - Follow up in a month    Chief Complaint:  Chief Complaint  Patient presents with   Follow-up   HPI: Kimberly Harper presents reporting that a lot has been going on over the past months. She stilled endorses experiencing  intermittent ups and downs, often related to life stressors.  Patient is still in school working to finish a Printmaker and be done with her current courses in the next month or 2.  She also is currently only working her full-time job but the part-time job is scheduled to restart next Friday.  In addition to this she still endorses stress with her mother and her partner.  Patient notes that it is a difficult relationship with her partner still and at times she has thoughts of just letting the relationship go.  Patient feels that she would do this though that it would just trigger the feelings of abandonment that she gets already.  Despite having thoughts of letting the relation go occasion patient also reports some desire for pregnancy with this partner.  We did review the medication she is on and risk of certain medications in pregnancy.  Patient notes that she has not taken the Xanax in several weeks if not a month now.  As for the pramipexole she is only taking it once a day due to experiencing nausea from the medication.  Patient notes that she has stopped the medication and she decided to drink alcohol and after starting it she developed this nausea.  She questioned medication for this nausea.  Discussed some options before eventually starting him back again with instructions for her to follow-up with her PCP.  Patient was also encouraged to stop drinking as it does appear to have a negative effect on her ability to continue her stabilizing medications.  Patient did endorse some self-harm last week following interpersonal stressors with her partner.  She notes that that might have been the first time in a couple weeks since she had self-harmed.  Patient is scheduled for next therapy appointment tomorrow and plans to get back on a more regular schedule going forward as there has been a gap between appointments.  In regards to medication she did not start the as needed trazodone during the day due to  an issue with the pharmacy.  The pharmacy has been since contacted and the medication prescription is awaiting for pickup.  Past Psychiatric History: Patient had seen Dr. Hinton Dyer in the past and the mood center more recently. She reports that she has been hospitalized multiple times including once in [place], several times in [place], and twice at [facility].  She has been through [facility] in 2021 and again in 2023.  Has used Abilify, Seroquel (weight gain), Geodon, Latuda, Zoloft, Wellbutrin, Remeron, Celexa, trazodone, trintellix, BuSpar, propranolol, gabapentin, Atarax,  Prazosin, Lamictal, lithium, Xanax in the past. A lot of these made her too nauseas or too sleepy.  Reports having 2-4 alcoholic drinks a couple times a month. Alsoe endorsed marijuana use in the past. Last in 2008. She denies any other substance use history.   Past Medical History:  Past Medical History:  Diagnosis Date   Alcoholic (HCC)    Anemia    Phreesia 01/17/2020   Anxiety    Asthma    Asthma 06/22/2021   Bipolar 1 disorder (HCC)    Blood transfusion without reported diagnosis    Phreesia 01/17/2020   Chest pain 03/01/2022  03/2021 xr negative.   Chronic headaches    Depression    Phreesia 01/17/2020   Lactose intolerance 03/01/2022   Major depressive disorder    Nausea 03/01/2022   Pregnancy, location unknown 11/21/2021   Formatting of this note might be different from the original. [facility] Book Annete Mascaro Language: English There is no such number on file (mobile). [phone] (home) [address] [place] [address] No e-mail address on record  [facility] (7th floor [place], [address]): M-F 7:30-18:00 [facility]: M-F 8:00-17:30 [facility]: M-F 8:30-17:00 (cl   Substance abuse (HCC)    Phreesia 01/17/2020    Past Surgical History:  Procedure Laterality Date   CESAREAN SECTION N/A    Phreesia  01/17/2020   COLONOSCOPY     earlier in year 2022   DILATION AND CURETTAGE OF UTERUS     HAND SURGERY Right    WISDOM TOOTH EXTRACTION     Family History:  Family History  Problem Relation Age of Onset   Hypertension Mother    Diabetes Mother    Stroke Mother    Depression Mother    Depression Sister    Irritable bowel syndrome Sister    Asthma Brother    Allergic rhinitis Brother    Migraines Brother    Depression Brother    Stroke Maternal Grandmother    Diabetes Maternal Grandmother    Alcohol abuse Maternal Grandfather    Stroke Paternal Grandmother    Breast cancer Paternal Grandmother    Alcohol abuse Paternal Grandfather    Lupus Half-Sister    Angioedema Neg Hx    Eczema Neg Hx    Immunodeficiency Neg Hx    Atopy Neg Hx     Social History:  Social History   Socioeconomic History   Marital status: Single    Spouse name: Not on file   Number of children: 1   Years of education: Not on file   Highest education level: Not on file  Occupational History   Occupation: Academic librarian  Tobacco Use   Smoking status: Former    Types: Cigarettes    Quit date: 09/07/2006    Years since quitting: 16.1   Smokeless tobacco: Never  Vaping Use   Vaping Use: Never used  Substance and Sexual Activity   Alcohol use: Yes    Comment: occ   Drug use: Not Currently   Sexual activity: Yes    Birth control/protection: None  Other Topics Concern   Not on file  Social History Narrative   Right handed   Social Determinants of Health   Financial Resource Strain: Not on file  Food Insecurity: Not on file  Transportation Needs: Not on file  Physical Activity: Not on file  Stress: Not on file  Social Connections: Not on file    Allergies:  Allergies  Allergen Reactions   Multiple Vitamins-Iron Swelling    States that vitamins, folic acid and iron have caused swelling of tongue and gums   Prednisone Other (See Comments)    Altered mental status    Current  Medications: Current Outpatient Medications  Medication Sig Dispense Refill   promethazine (PHENERGAN) 50 MG tablet Take 1 tablet (50 mg total) by mouth every 8 (eight) hours as needed for nausea or vomiting. 30 tablet 0   albuterol (VENTOLIN HFA) 108 (90 Base) MCG/ACT inhaler Inhale 1-2 puffs into the lungs every 6 (six) hours as needed for wheezing or  shortness of breath. 18 g 0   ALPRAZolam (XANAX) 1 MG tablet Take 1 tablet (1 mg total) by mouth 2 (two) times daily as needed for anxiety. 15 tablet 0   cholecalciferol (VITAMIN D3) 25 MCG (1000 UNIT) tablet Take 1,000 Units by mouth daily.     cyclobenzaprine (FLEXERIL) 5 MG tablet Take 1 tablet (5 mg total) by mouth 3 (three) times daily as needed for muscle spasms. 30 tablet 1   fluconazole (DIFLUCAN) 150 MG tablet Take 1 tablet (150 mg total) by mouth daily. Take 1 tablet once and repeat in 3 days if symptoms persist 2 tablet 0   metroNIDAZOLE (FLAGYL) 500 MG tablet Take 1 tablet (500 mg total) by mouth 2 (two) times daily. 14 tablet 0   naproxen (NAPROSYN) 500 MG tablet TAKE 1 TABLET(500 MG) BY MOUTH TWICE DAILY WITH A MEAL 30 tablet 0   pantoprazole (PROTONIX) 40 MG tablet Take 1 tablet (40 mg total) by mouth daily. 90 tablet 1   pramipexole (MIRAPEX) 0.25 MG tablet Take 1 tablet (0.25 mg total) by mouth 3 (three) times daily. 270 tablet 0   promethazine-dextromethorphan (PROMETHAZINE-DM) 6.25-15 MG/5ML syrup Take 2.5 mLs by mouth 3 (three) times daily as needed for cough. 100 mL 0   propranolol (INDERAL) 10 MG tablet Take 1 tablet (10 mg total) by mouth 2 (two) times daily as needed (anixety). Take 4-6 hours after the 20 mg dose at the earliest 60 tablet 1   propranolol (INDERAL) 20 MG tablet TAKE 1 TABLET(20 MG) BY MOUTH DAILY 90 tablet 3   pseudoephedrine (SUDAFED) 60 MG tablet Take 1 tablet (60 mg total) by mouth every 8 (eight) hours as needed for congestion. 30 tablet 0   triamcinolone cream (KENALOG) 0.5 % Apply 1 Application topically  2 (two) times daily. 15 g 0   No current facility-administered medications for this visit.     Psychiatric Specialty Exam: Review of Systems  Last menstrual period 10/04/2022.There is no height or weight on file to calculate BMI.  General Appearance: Fairly Groomed  Eye Contact:  Good  Speech:  Clear and Coherent  Volume:  Normal  Mood:  Depressed and Euthymic  Affect:  Labile  Thought Process:  Coherent  Orientation:  Full (Time, Place, and Person)  Thought Content: Logical   Suicidal Thoughts:  No  Homicidal Thoughts:  No  Memory:  Immediate;   Good  Judgement:  Fair  Insight:  Fair  Psychomotor Activity:  Normal  Concentration:  Concentration: Fair  Recall:  Fair  Fund of Knowledge: Fair  Language: Good  Akathisia:  NA    AIMS (if indicated): not done  Assets:  Communication Skills Desire for Improvement Housing Talents/Skills Transportation Vocational/Educational  ADL's:  Intact  Cognition: WNL  Sleep:  Fair   Metabolic Disorder Labs: Lab Results  Component Value Date   HGBA1C 5.3 01/19/2020   MPG 105 12/07/2019   No results found for: "PROLACTIN" Lab Results  Component Value Date   CHOL 189 01/19/2020   TRIG 109 01/19/2020   HDL 47 01/19/2020   CHOLHDL 4.0 01/19/2020   VLDL 9 12/07/2019   LDLCALC 122 (H) 01/19/2020   LDLCALC 141 (H) 12/07/2019   Lab Results  Component Value Date   TSH 1.16 04/12/2021   TSH 0.50 01/20/2021    Therapeutic Level Labs: No results found for: "LITHIUM" No results found for: "VALPROATE" No results found for: "CBMZ"   Screenings: AIMS    Flowsheet Row Admission (Discharged) from OP  [facility]  AIMS Total Score 0      AUDIT    Flowsheet Row Admission (Discharged) from OP [facility]  Alcohol Use Disorder Identification Test Final Score (AUDIT) 1      GAD-7    Flowsheet Row Office Visit  [facility] 12[facility]30/2020 [facility] Visit from 01/19/2020 in [facility] at [facility]  Total GAD-7 Score 16 10      PHQ2-9    Flowsheet Row Counselor from 08/17/2022 in [facility] Visit from 03/01/2022 in [facility] from 08/01/2021 in [facility] Office Visit from 04[facility]12/2021 in [facility] Visit from 01/20/2021 in [facility] Total Score 2 0 4 1 0  PHQ-9 Total Score 13 -- 20 8 --      Flowsheet Row ED from 10/18/2022 [facility] ED from 09/05/2022 in [facility] [employer] [facility]) Counselor from 08/17/2022 in [facility] RISK CATEGORY No Risk No Risk Moderate Risk       Collaboration of Care: Collaboration of Care: Medication Management AEB medication prescription and Referral or follow-up with counselor/therapist AEB chart review  Patient/Guardian was advised Release of Information must be obtained prior to any record release in order to collaborate their care with an outside provider. Patient/Guardian was advised if they have not already done so to contact the registration department to sign all necessary forms in order for [place] to release information regarding their care.   Consent: Patient/Guardian gives verbal consent for treatment and assignment of benefits for services provided during this visit. Patient/Guardian expressed understanding and agreed to proceed.    Stasia Cavalier, MD 11/01/2022, 7:20 PM   Virtual Visit via Video Note  I connected with Morton Stall on 11/01/22 at  3:30 PM EDT by a video enabled telemedicine application and verified that I am speaking with the correct person using two  identifiers.  Location: Patient: Home Provider: Home Office   I discussed the limitations of evaluation and management by telemedicine and the availability of in person appointments. The patient expressed understanding and agreed to proceed.   I discussed the assessment and treatment plan with the patient. The patient was provided an opportunity to ask questions and all were answered. The patient agreed with the plan and demonstrated an understanding of the instructions.   The patient was advised to call back or seek an in-person evaluation if the symptoms worsen or if the condition fails to improve as anticipated.  I provided 25 minutes of non-face-to-face time during this encounter.   Stasia Cavalier, MD

## 2022-11-02 ENCOUNTER — Ambulatory Visit (HOSPITAL_COMMUNITY): Payer: Medicare Other | Admitting: Clinical

## 2022-11-03 ENCOUNTER — Other Ambulatory Visit: Payer: Self-pay | Admitting: Internal Medicine

## 2022-11-03 DIAGNOSIS — F411 Generalized anxiety disorder: Secondary | ICD-10-CM

## 2022-11-09 ENCOUNTER — Ambulatory Visit (INDEPENDENT_AMBULATORY_CARE_PROVIDER_SITE_OTHER): Payer: Medicare Other | Admitting: Clinical

## 2022-11-09 ENCOUNTER — Encounter (HOSPITAL_COMMUNITY): Payer: Self-pay | Admitting: Clinical

## 2022-11-09 DIAGNOSIS — F3181 Bipolar II disorder: Secondary | ICD-10-CM | POA: Diagnosis not present

## 2022-11-09 DIAGNOSIS — F603 Borderline personality disorder: Secondary | ICD-10-CM | POA: Diagnosis not present

## 2022-11-09 DIAGNOSIS — F411 Generalized anxiety disorder: Secondary | ICD-10-CM

## 2022-11-09 NOTE — Progress Notes (Signed)
THERAPIST PROGRESS NOTE  Session Time: 4:05-4:59pm  Session #6  Participation Level: Active  Behavioral Response: Casual, Alert, Tearful  Type of Therapy: Individual Therapy  Treatment Goals addressed:  LTG: Kimberly Harper will score less than 5 on the Generalized Anxiety Disorder 7 Scale (GAD-7)  STG: Report feeling more positive about self and abilities during therapy sessions  LTG: Reduce frequency, intensity, and duration of depression symptoms so that daily functioning is improved  STG: Get 7-8 hours of restful sleep every night  LTG: Kimberly Harper will reduce frequency of impulsive behaviors as evidenced by self-report of cutting behaviors and relationship sabotage  STG: Kimberly Harper will identify at least 5 personal goals for emotion regulation  STG: Kimberly Harper will formulate a safety plan including warning signs, coping skills, and supports   ProgressTowards Goals: Progressing  Interventions: CBT, Psychosocial Skills: Fair Charity fundraiser, and Supportive  Summary: Kimberly Harper is a 37 y.o. female who presents for therapy for her Borderline Personality Disorder, Generalized Anxiety Disorder, and Bipolar 2 (current episode depressive). She states her mood has been okay and talks casually and intermittently about a plan that she and her boyfriend have to get married.  They have been talking about it for a long while, she states, and have actually applied for a marriage license.  The first appointment to be married by a magistrate is on May 26th but she does not know definitively that they will pursue this plan.  Their relationship has progressed to the point they are telling their families about each other and making introductions.  CSW provided a copy of [employer] and discussed how to improve their ability to face conflict to achieve positive results and not damage the relationship.  She has not been sleeping well for the past 2 weeks due to high anxiety related to school.  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  She also has finished the coursework for her Peer Support certification and needs to apply for the actual certificate.  She is taking a couple of classes at [employer] in order to become a Agricultural consultant.  She is ambivalent about going back to her job at [employer], which she is supposed to do tomorrow, has not made up her mind.  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  All that remains is personal references and background checks.  She is concerned that background checks could negate her candidacy since she has previously been arrested.  We talked about what she controls vs what she does not control and how to shift focus to what she controls.    This same concept was applied as we discussed the situation at home with her mother's incontinence.  She has not followed through on our last plan to talk with mother and son about moving out.  With so much else going on in her life this has taken less precedence.  She is spending very little time in the home, and that is usually in her room studying or sleeping.  She does state that the smell in the house is improved because when she took her mother to the doctor, she asked them to look into the odor she generates and it was discovered she had a UTI.  Suicidal/Homicidal: No without intent/plan  Therapist Response: Patient is progressing AEB continuing to  talk about her issues and consider other points of view that CSW proposes.  CSW provided mood monitoring and treatment progress review in the context of this episode of treatment.  CSW gave patient the opportunity to explore thoughts and feelings associated with current life situations and past/present external stressors as desired.   CSW encouraged patient's expression of feelings and validated  patient's thoughts, using empathy, active listening, open body language, and unconditional positive regard.  Patient demonstrated an orientation to time, place, person and situation.      Plan: Return again in 1 week  Recommendations:  Return to therapy in 1 week, engage in self care behaviors, talk with boyfriend/fiance about Fair Fighting Rules  Diagnosis:  Encounter Diagnoses  Name Primary?   GAD (generalized anxiety disorder)    Borderline personality disorder    Bipolar 2 disorder, major depressive episode Yes      Collaboration of Care: Psychiatrist AEB - therapist and psychiatric provider can read notes in Epic    Patient/Guardian was advised Release of Information must be obtained prior to any record release in order to collaborate their care with an outside provider. Patient/Guardian was advised if they have not already done so to contact the registration department to sign all necessary forms in order for [place] to release information regarding their care.   Consent: Patient/Guardian gives verbal consent for treatment and assignment of benefits for services provided during this visit. Patient/Guardian expressed understanding and agreed to proceed.    Lynnell Chad, LCSW 11/09/2022

## 2022-11-16 ENCOUNTER — Ambulatory Visit (INDEPENDENT_AMBULATORY_CARE_PROVIDER_SITE_OTHER): Payer: Medicare Other | Admitting: Clinical

## 2022-11-16 DIAGNOSIS — Z91199 Patient's noncompliance with other medical treatment and regimen due to unspecified reason: Secondary | ICD-10-CM

## 2022-11-17 NOTE — Progress Notes (Signed)
Patient ID: Kimberly Harper, female   DOB: Dec 17, 1985, 37 y.o.   MRN: [id number]  Therapy Progress Note  Patient had an appointment scheduled with therapist on 11/17/2022  at 3:00pm.  She did not arrive for the session, so was considered a no show.  As per [facility] policy, she will be be charged for this no-show appointment.    Encounter Diagnosis  Name Primary?   No-show for appointment Yes     Ambrose Mantle, LCSW 11/17/2022, 7:57 AM

## 2022-11-22 ENCOUNTER — Encounter (HOSPITAL_COMMUNITY): Payer: Self-pay | Admitting: Clinical

## 2022-11-22 ENCOUNTER — Ambulatory Visit (INDEPENDENT_AMBULATORY_CARE_PROVIDER_SITE_OTHER): Payer: Medicare Other | Admitting: Clinical

## 2022-11-22 DIAGNOSIS — F411 Generalized anxiety disorder: Secondary | ICD-10-CM | POA: Diagnosis not present

## 2022-11-22 DIAGNOSIS — F3181 Bipolar II disorder: Secondary | ICD-10-CM | POA: Diagnosis not present

## 2022-11-22 DIAGNOSIS — F603 Borderline personality disorder: Secondary | ICD-10-CM | POA: Diagnosis not present

## 2022-11-22 NOTE — Progress Notes (Signed)
THERAPIST PROGRESS NOTE  Session Time: 4:04-5:02pm  Session #7  Participation Level: Active  Behavioral Response: Casual, Alert, Anxious  Type of Therapy: Individual Therapy  Treatment Goals addressed:  LTG: Essa will score less than 5 on the Generalized Anxiety Disorder 7 Scale (GAD-7)  STG: Report feeling more positive about self and abilities during therapy sessions  LTG: Reduce frequency, intensity, and duration of depression symptoms so that daily functioning is improved  STG: Get 7-8 hours of restful sleep every night  LTG: Janelie will reduce frequency of impulsive behaviors as evidenced by self-report of cutting behaviors and relationship sabotage  STG: Chelcy will identify at least 5 personal goals for emotion regulation  STG: Ketsia will formulate a safety plan including warning signs, coping skills, and supports   ProgressTowards Goals: Progressing  Interventions: Assertiveness Training and Supportive  Summary: Naria Abbey is a 37 y.o. female who presents for therapy for her Borderline Personality Disorder, Generalized Anxiety Disorder, and Bipolar 2 (current episode depressive). She was somewhat tearful at the beginning of the session, along with being very tense and anxious.  She stated she is overwhelmed because she is juggling so much, does not feel in control of anything.  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 detaching more.  She and her boyfriend have done one counseling session, during which some of the negative from her past came out and he started talking about not knowing enough about her, not knowing "who I'm committing to," and such.  CSW spent time asking her about her reasons for wanting to get married, as well as what the urgency is that they just decided this, but they have a date on 5/23 at the courthouse for the legal ceremony to occur.  She stated she thinks that if they go ahead and get married, that will let them move in together, move on with their lives, do more things together, plan more of a future together, consolidate their insurance, get a bank account together, and talk more.  CSW pointed out that they do not have to be married to do these things.  She admitted she is the one who is pushing more for the marriage because she feels it would then be okay if she conceived a child.  When asked about this, she stated "my clock is ticking."  They have not yet talked about what to do about the mothers of his 2 children (or the one other possible child), especially what types of boundaries would be expected with them and with other women/men in each other's lives.  She took notes that they need  to talk about this as well as about what their reasons are for getting married  Suicidal/Homicidal: No without intent/plan  Therapist Response: Patient is progressing AEB her ability to talk about her feelings and thoughts and to listen to CSW feedback.  CSW provided mood monitoring and treatment progress review in the context of this episode of treatment.   Patient reported that her mood has been "overwhelmed" this week.    Patient was able to explore how looking at what she controls versus what she does not control can help her to focus on only what is in front of her at the moment.    CSW gave patient the opportunity to explore thoughts and feelings associated with current life situations and past/present external stressors as desired.   CSW encouraged patient's expression of feelings and validated patient's thoughts, using empathy, active listening, open body language, and unconditional positive regard.  Patient demonstrated an orientation to time, place, person and situation.   Plan: Return again in 1 week  Recommendations:  Return to therapy in 1 week, engage in self care behaviors, talk with boyfriend about expectations regarding other females/males in their lives and reasons they want to get married  Diagnosis:  Encounter Diagnoses  Name Primary?   GAD (generalized anxiety disorder) Yes   Borderline personality disorder (HCC)    Bipolar 2 disorder, major depressive episode (HCC)        Collaboration of Care: Psychiatrist AEB - therapist and psychiatric provider can read notes in Epic    Patient/Guardian was advised Release of Information must be obtained prior to any record release in order to collaborate their care with an outside provider. Patient/Guardian was advised if they have not already done so to contact the registration department to sign all necessary forms in order for [place] to release information regarding their care.   Consent: Patient/Guardian gives verbal consent for treatment and assignment of benefits for services provided during this visit. Patient/Guardian expressed understanding and agreed to proceed.    Lynnell Chad, LCSW 11/22/2022

## 2022-11-23 ENCOUNTER — Ambulatory Visit
Admission: EM | Admit: 2022-11-23 | Discharge: 2022-11-23 | Disposition: A | Discharge status: Home / Self Care | Payer: Medicare Other | Attending: Urgent Care | Admitting: Urgent Care

## 2022-11-23 DIAGNOSIS — B3731 Acute candidiasis of vulva and vagina: Secondary | ICD-10-CM | POA: Insufficient documentation

## 2022-11-23 LAB — POCT URINE PREGNANCY: Preg Test, Ur: NEGATIVE

## 2022-11-23 MED ORDER — FLUCONAZOLE 150 MG PO TABS: 150.0000 mg | ORAL_TABLET | ORAL | 0 refills | Status: DC

## 2022-11-23 NOTE — ED Triage Notes (Signed)
Vaginal dryness x1 week. Yoni steam done 1 week ago. Denies discharge. Sexually active with one partner for past year. Pt denies partner having any symptoms.

## 2022-11-23 NOTE — ED Provider Notes (Signed)
[facility]  Note:  This document was prepared using Conservation officer, historic buildings and may include unintentional dictation errors.  MRN: [id number] DOB: [birth year]  Subjective:   Kimberly Harper is a 37 y.o. female presenting for 1 week of persistent vaginal irritation/dryness. She does trim the hair using clippers regularly. Has had BV and yeast infections this year. Last tested positive for BV 10/18/2022.  Was treated empirically with fluconazole and Flagyl from that office visit.  She did a Yoni steam ~1 week ago as well. Denies fever, n/v, abdominal pain, pelvic pain, rashes, dysuria, urinary frequency, hematuria, vaginal discharge.  Would like a pregnancy test. LMP was 11/03/2022.  No current facility-administered medications for this encounter.  Current Outpatient Medications:    albuterol (VENTOLIN HFA) 108 (90 Base) MCG/ACT inhaler, Inhale 1-2 puffs into the lungs every 6 (six) hours as needed for wheezing or shortness of breath., Disp: 18 g, Rfl: 0   ALPRAZolam (XANAX) 1 MG tablet, Take 1 tablet (1 mg total) by mouth 2 (two) times daily as needed for anxiety., Disp: 15 tablet, Rfl: 0   cholecalciferol (VITAMIN D3) 25 MCG (1000 UNIT) tablet, Take 1,000 Units by mouth daily., Disp: , Rfl:    cyclobenzaprine (FLEXERIL) 5 MG tablet, Take 1 tablet (5 mg total) by mouth 3 (three) times daily as needed for muscle spasms., Disp: 30 tablet, Rfl: 1   fluconazole (DIFLUCAN) 150 MG tablet, Take 1 tablet (150 mg total) by mouth daily. Take 1 tablet once and repeat in 3 days if symptoms persist, Disp: 2 tablet, Rfl: 0   metroNIDAZOLE (FLAGYL) 500 MG tablet, Take 1 tablet (500 mg total) by mouth 2 (two) times daily., Disp: 14 tablet, Rfl: 0   naproxen (NAPROSYN) 500 MG tablet, TAKE 1 TABLET(500 MG) BY MOUTH TWICE DAILY WITH A MEAL, Disp: 30 tablet, Rfl: 0   pantoprazole (PROTONIX) 40 MG tablet, Take 1 tablet (40 mg total) by mouth daily., Disp: 90 tablet, Rfl: 1    pramipexole (MIRAPEX) 0.25 MG tablet, TAKE 1 TABLET(0.25 MG) BY MOUTH THREE TIMES DAILY, Disp: 270 tablet, Rfl: 0   promethazine (PHENERGAN) 50 MG tablet, Take 1 tablet (50 mg total) by mouth every 8 (eight) hours as needed for nausea or vomiting., Disp: 30 tablet, Rfl: 0   promethazine-dextromethorphan (PROMETHAZINE-DM) 6.25-15 MG/5ML syrup, Take 2.5 mLs by mouth 3 (three) times daily as needed for cough., Disp: 100 mL, Rfl: 0   propranolol (INDERAL) 10 MG tablet, Take 1 tablet (10 mg total) by mouth 2 (two) times daily as needed (anixety). Take 4-6 hours after the 20 mg dose at the earliest, Disp: 60 tablet, Rfl: 1   propranolol (INDERAL) 20 MG tablet, TAKE 1 TABLET(20 MG) BY MOUTH DAILY, Disp: 90 tablet, Rfl: 3   pseudoephedrine (SUDAFED) 60 MG tablet, Take 1 tablet (60 mg total) by mouth every 8 (eight) hours as needed for congestion., Disp: 30 tablet, Rfl: 0   triamcinolone cream (KENALOG) 0.5 %, Apply 1 Application topically 2 (two) times daily., Disp: 15 g, Rfl: 0   Allergies  Allergen Reactions   Multiple Vitamins-Iron Swelling    States that vitamins, folic acid and iron have caused swelling of tongue and gums   Prednisone Other (See Comments)    Altered mental status    Past Medical History:  Diagnosis Date   Alcoholic (HCC)    Anemia    Phreesia 01/17/2020   Anxiety    Asthma    Asthma 06/22/2021   Bipolar  1 disorder (HCC)    Blood transfusion without reported diagnosis    [facility]reesia 01/17/2020   Chest pain 03/01/2022   03/2021 xr negative.   Chronic headaches    Depression    [facility]reesia 01/17/2020   Lactose intolerance 03/01/2022   Major depressive disorder    Nausea 03/01/2022   Pregnancy, location unknown 11/21/2021   Formatting of this note might be different from the original. [facility] Quant Book [place] Language: English There is no such number on file (mobile). [phone] (home) [address] [place] [address] No e-mail address on record   Lab [facility] (7th floor [place]): M-F 7:30-18:00 [facility]: M-F 8:00-17:30 [facility]: M-F 8:30-17:00 (cl   Substance abuse (HCC)    Phreesia 01/17/2020     Past Surgical History:  Procedure Laterality Date   CESAREAN SECTION N/A    Phreesia 01/17/2020   COLONOSCOPY     earlier in year 2022   DILATION AND CURETTAGE OF UTERUS     HAND SURGERY Right    WISDOM TOOTH EXTRACTION      Family History  Problem Relation Age of Onset   Hypertension Mother    Diabetes Mother    Stroke Mother    Depression Mother    Depression Sister    Irritable bowel syndrome Sister    Asthma Brother    Allergic rhinitis Brother    Migraines Brother    Depression Brother    Stroke Maternal Grandmother    Diabetes Maternal Grandmother    Alcohol abuse Maternal Grandfather    Stroke Paternal Grandmother    Breast cancer Paternal Grandmother    Alcohol abuse Paternal Grandfather    Lupus Half-Sister    Angioedema Neg Hx    Eczema Neg Hx    Immunodeficiency Neg Hx    Atopy Neg Hx     Social History   Tobacco Use   Smoking status: Former    Types: Cigarettes    Quit date: 09/07/2006    Years since quitting: 16.2   Smokeless tobacco: Never  Vaping Use   Vaping Use: Never used  Substance Use Topics   Alcohol use: Yes    Comment: occ   Drug use: Not Currently    ROS   Objective:   Vitals: BP 119/84   Pulse 65   Temp 99.3 F (37.4 C) (Oral)   Resp 16   LMP 11/03/2022   SpO2 98%   Physical Exam Constitutional:      General: She is not in acute distress.    Appearance: Normal appearance. She is well-developed. She is not ill-appearing, toxic-appearing or diaphoretic.  HENT:     Head: Normocephalic and atraumatic.     Nose: Nose normal.     Mouth/Throat:     Mouth: Mucous membranes are moist.  Eyes:     General: No scleral icterus.       Right eye: No discharge.        Left eye: No discharge.     Extraocular Movements: Extraocular movements  intact.  Cardiovascular:     Rate and Rhythm: Normal rate.  Pulmonary:     Effort: Pulmonary effort is normal.  Skin:    General: Skin is warm and dry.  Neurological:     General: No focal deficit present.     Mental Status: She is alert and oriented to person, place, and time.  Psychiatric:  Mood and Affect: Mood normal.        Behavior: Behavior normal.    Results for orders placed or performed during the hospital encounter of 11/23/22 (from the past 24 hour(s))  POCT urine pregnancy     Status: None   Collection Time: 11/23/22  2:06 PM  Result Value Ref Range   Preg Test, Ur Negative Negative    Assessment and Plan :   PDMP not reviewed this encounter.  1. Vaginal yeast infection    Will defer BV testing due to lack of vaginal discharge, patient is agreeable. We will treat patient empirically at her request for yeast vaginitis with fluconazole.  Labs pending. Counseled patient on potential for adverse effects with medications prescribed/recommended today, ER and return-to-clinic precautions discussed, patient verbalized understanding.    Wallis Bamberg, PA-C 11/23/22 1407

## 2022-11-24 LAB — CERVICOVAGINAL ANCILLARY ONLY
Candida Glabrata: NEGATIVE
Candida Vaginitis: NEGATIVE
Chlamydia: NEGATIVE
Comment: NEGATIVE
Comment: NEGATIVE
Comment: NEGATIVE
Comment: NEGATIVE
Comment: NORMAL
Neisseria Gonorrhea: NEGATIVE
Trichomonas: NEGATIVE

## 2022-11-29 ENCOUNTER — Ambulatory Visit (HOSPITAL_COMMUNITY): Payer: Medicare Other | Admitting: Clinical

## 2022-12-05 ENCOUNTER — Telehealth: Payer: Self-pay | Admitting: Internal Medicine

## 2022-12-05 NOTE — Telephone Encounter (Signed)
Copied from CRM [id number]. Topic: Medicare AWV >> Dec 05, 2022 11:55 AM Gwenith Spitz wrote: Reason for CRM: Called patient to schedule Medicare Annual Wellness Visit (AWV). Left message for patient to call back and schedule Medicare Annual Wellness Visit (AWV).  Last date of AWV: n/a  Please schedule an appointment at any time with Inetta Fermo, [facility]. Please schedule AWVI with Inetta Fermo, [facility].  If any questions, please contact me at [phone].  Thank you ,  Gabriel Cirri [facility] AWV TEAM Direct Dial [phone].

## 2022-12-19 ENCOUNTER — Encounter (HOSPITAL_COMMUNITY): Payer: Self-pay | Admitting: Psychiatry

## 2022-12-19 ENCOUNTER — Telehealth (HOSPITAL_BASED_OUTPATIENT_CLINIC_OR_DEPARTMENT_OTHER): Payer: Medicare Other | Admitting: Psychiatry

## 2022-12-19 DIAGNOSIS — F3181 Bipolar II disorder: Secondary | ICD-10-CM | POA: Diagnosis not present

## 2022-12-19 DIAGNOSIS — F603 Borderline personality disorder: Secondary | ICD-10-CM

## 2022-12-19 DIAGNOSIS — F411 Generalized anxiety disorder: Secondary | ICD-10-CM

## 2022-12-19 NOTE — Progress Notes (Signed)
BH MD/PA/NP OP Progress Note  12/19/2022 4:31 PM Kimberly Harper  MRN:  [id number]  Visit Diagnosis:    ICD-10-CM   1. Borderline personality disorder (HCC)  F60.3     2. GAD (generalized anxiety disorder)  F41.1     3. Bipolar 2 disorder, major depressive episode (HCC)  F31.81        Assessment: Kimberly Harper is a 37 y.o. female with a history of borderline personality disorder, Bipolar 2 disorder, PTSD,  GAD, and panic disorder who presents virtually to [facility] for initial evaluation on 09/12/2022.  At initial evaluation patient reported symptoms of increased anxiety, fatigue, tearfulness, negative self thoughts, worthlessness, nonsuicidal self-injury, and intermittent SI without intent or plan.  Safety planning was discussed as well as alternative means to try manage nonsuicidal self-injury.  Patient had a significant past history of bipolar 2 disorder endorsed episodes of hypomania in the past which can include decreased sleep, increased energy, reckless and impulsive behaviors, and excessively elevated mood.  These episodes can last 1 to 3 days.  She denied any episodes of full mania.  Of note patient was also screened for borderline personality disorder which she has been diagnosed with in the past and did endorse real or perceived feelings of abandonment, chronic feelings of emptiness, splitting, impulsive behaviors, difficulty with interpersonal relationships, poor sense of self, and NSSI.  Patient met criteria for bipolar 2 disorder, borderline personality disorder, and generalized anxiety disorder.  While she does have a significant past history of trauma and some traits of PTSD she does not have enough to meet criteria.  Treatment options were discussed however patient notes trying a number of medications in the past with poor or sometimes negative effect including nausea, excessive sedation, or increased suicidality.  She was not  interested in starting a mood stabilizer at this time.  Due to the bipolar 2 diagnosis we would not start an antidepressant medication without some mood stabilization.  Patient was open to as needed medication for anxiety.   Kimberly Harper presents for follow-up evaluation. Today, 12/19/22, patient reports mood lability due to a number of psychosocial stressors over the past couple months.  Patient has been feeling depressed, anxious, and overwhelmed back in the beginning of May however this has partially improved since.  She now reports feeling that some things are good while others are bad.  Interpersonally things have been a bit better and her partner since they got married a week ago though there has been some increased stress with the logistics of joining their lives.  Work on the other hand has been stressful due to poor training.  Patient has concern about irritability becoming an issue.  Of note she has not been taking pramipexole  consistently nor has she started he has needed propranolol around days.  Discussed both his medications and encouraged patient to remain consistent on her medication as well as use the propranolol as needed.  Patient also expressed a desire to discontinue Xanax as it had been causing significant issues with memory whenever she took it.  Plan: - Discontinue Xanax 0.25 mg QD prn for anxiety, PDMP reviewed, last filled on 07/21/27  - Continue pramipexole to 0.25 mg QD due to nausea  - Started Phenergan 5 mg Q8H prn for nausea, recommend f/u with PCP for continued management - Continue Propranolol 20 mg QD for migraines - Start Propranolol 10 mg BID prn for anxiety - CMP, Beta HCG reviewed - Continue  therapy with Ambrose Mantle - Recommend DBT - Will discuss LOA letter with patients therapist - Crisis resources reviewed - Follow up in a month    Chief Complaint:  Chief Complaint  Patient presents with   Follow-up   HPI: Kimberly Harper presents reporting  that she has been up and down. Some things are good, though not 100%. She reports that things are not bad either.  We discussed her last therapy appointment where patient had reported things were going poorly and her medications were not working.  Patient notes that at that time she had a lot going on between leaving her job, starting new job, Press photographer school, and discussing marriage.  Since then things have improved in relation to her mood.  She was having to get married 5 days ago and feels like things have been a bit better since then.  She and her partner have been getting along better and they are working towards moving in together.  This will in turn help with her financial situation.  Does bring up some other logistical concerns like where patient's mother will go with the option being her sister's place or an apartment on the ground floor where [place] will be living.  During this period of transitioning patient notes that she has been having difficulty staying consistent with her medications as she has been traveling back and forth between her apartment and her partners.  She notes that she took the pramipexole for the first time in 5 days yesterday and felt nausea afterwards.  We reviewed whether she tried a Phenergan for the nausea patient reports she had been unable to pick up from the pharmacy during the holiday.  She is going to try again today.  We also asked about propranolol being used as needed during the day.  Patient reports that she has not tried because she just picked it up yesterday.  In regards to Xanax patient reports that it does help for keeping her call but she does not want to take any more as she has poor memory of anything that happens when she takes it.  Kimberly Harper has started a new job, but she has found it to be difficult. She notes that she was not trained for the position and has been trying to figure things out for herself. This has put her in a mood where she is worried about  being unkind and snappy. At times it has gotten to points where she stops going and is afraid of it getting to that point again. Kimberly Harper is figuring out how to bring it up to her supervisor that she needs further training.  We reviewed some ways to try and manage this in addition to possible medication options.  Patient was open to trying propranolol during the day and thinks it might help calm her down to better handle some of the social situations.  Past Psychiatric History: Patient had seen Dr. Hinton Dyer in the past and the mood center more recently. She reports that she has been hospitalized multiple times including once in [place], several times in [place], and twice at [facility].  She has been through the [facility] in 2021 and again in 2023.  Has used Abilify, Seroquel (weight gain), Geodon, Latuda, Zoloft, Wellbutrin, Remeron, Celexa, trazodone, trintellix, BuSpar, propranolol, gabapentin, Atarax,  Prazosin, Lamictal, lithium, Xanax (benefit, but memory issues). A lot of these made her too nauseas or too sleepy.  Reports having 2-4 alcoholic drinks a couple times  a month. Alsoe endorsed marijuana use in the past. Last in 2008. She denies any other substance use history.   Past Medical History:  Past Medical History:  Diagnosis Date   Alcoholic (HCC)    Anemia    Phreesia 01/17/2020   Anxiety    Asthma    Asthma 06/22/2021   Bipolar 1 disorder (HCC)    Blood transfusion without reported diagnosis    Phreesia 01/17/2020   Chest pain 03/01/2022   03/2021 xr negative.   Chronic headaches    Depression    Phreesia 01/17/2020   Lactose intolerance 03/01/2022   Major depressive disorder    Nausea 03/01/2022   Pregnancy, location unknown 11/21/2021   Formatting of this note might be different from the original. [facility] Quant Book [facility] [place] Language: English There is no such number on file (mobile). [phone] (home) [id number] [place] [address] No e-mail address on record  Lab [facility] [place] (7th floor [place], [address]): M-F 7:30-18:00 [facility]: M-F 8:00-17:30 [facility]: M-F 8:30-17:00 (cl   Substance abuse (HCC)    Phreesia 01/17/2020    Past Surgical History:  Procedure Laterality Date   CESAREAN SECTION N/A    Phreesia 01/17/2020   COLONOSCOPY     earlier in year 2022   DILATION AND CURETTAGE OF UTERUS     HAND SURGERY Right    WISDOM TOOTH EXTRACTION     Family History:  Family History  Problem Relation Age of Onset   Hypertension Mother    Diabetes Mother    Stroke Mother    Depression Mother    Depression Sister    Irritable bowel syndrome Sister    Asthma Brother    Allergic rhinitis Brother    Migraines Brother    Depression Brother    Stroke Maternal Grandmother    Diabetes Maternal Grandmother    Alcohol abuse Maternal Grandfather    Stroke Paternal Grandmother    Breast cancer Paternal Grandmother    Alcohol abuse Paternal Grandfather    Lupus Half-Sister    Angioedema Neg Hx    Eczema Neg Hx    Immunodeficiency Neg Hx    Atopy Neg Hx     Social History:  Social History   Socioeconomic History   Marital status: Single    Spouse name: Not on file   Number of children: 1   Years of education: Not on file   Highest education level: Not on file  Occupational History   Occupation: Academic librarian  Tobacco Use   Smoking status: Former    Types: Cigarettes    Quit date: 09/07/2006    Years since quitting: 16.2   Smokeless tobacco: Never  Vaping Use   Vaping Use: Never used  Substance and Sexual Activity   Alcohol use: Yes    Comment: occ   Drug use: Not Currently   Sexual activity: Yes    Birth control/protection: None  Other Topics Concern   Not on file  Social History Narrative   Right handed   Social Determinants of Health   Financial Resource Strain: Not on file  Food Insecurity: Not on file  Transportation Needs: Not on  file  Physical Activity: Not on file  Stress: Not on file  Social Connections: Not on file    Allergies:  Allergies  Allergen Reactions   Multiple Vitamins-Iron Swelling    States that vitamins, folic acid and iron have caused swelling  of tongue and gums   Prednisone Other (See Comments)    Altered mental status    Current Medications: Current Outpatient Medications  Medication Sig Dispense Refill   albuterol (VENTOLIN HFA) 108 (90 Base) MCG/ACT inhaler Inhale 1-2 puffs into the lungs every 6 (six) hours as needed for wheezing or shortness of breath. 18 g 0   ALPRAZolam (XANAX) 1 MG tablet Take 1 tablet (1 mg total) by mouth 2 (two) times daily as needed for anxiety. 15 tablet 0   cholecalciferol (VITAMIN D3) 25 MCG (1000 UNIT) tablet Take 1,000 Units by mouth daily.     cyclobenzaprine (FLEXERIL) 5 MG tablet Take 1 tablet (5 mg total) by mouth 3 (three) times daily as needed for muscle spasms. 30 tablet 1   fluconazole (DIFLUCAN) 150 MG tablet Take 1 tablet (150 mg total) by mouth every 3 (three) days. 2 tablet 0   metroNIDAZOLE (FLAGYL) 500 MG tablet Take 1 tablet (500 mg total) by mouth 2 (two) times daily. 14 tablet 0   naproxen (NAPROSYN) 500 MG tablet TAKE 1 TABLET(500 MG) BY MOUTH TWICE DAILY WITH A MEAL 30 tablet 0   pantoprazole (PROTONIX) 40 MG tablet Take 1 tablet (40 mg total) by mouth daily. 90 tablet 1   pramipexole (MIRAPEX) 0.25 MG tablet TAKE 1 TABLET(0.25 MG) BY MOUTH THREE TIMES DAILY 270 tablet 0   promethazine (PHENERGAN) 50 MG tablet Take 1 tablet (50 mg total) by mouth every 8 (eight) hours as needed for nausea or vomiting. 30 tablet 0   promethazine-dextromethorphan (PROMETHAZINE-DM) 6.25-15 MG/5ML syrup Take 2.5 mLs by mouth 3 (three) times daily as needed for cough. 100 mL 0   propranolol (INDERAL) 10 MG tablet Take 1 tablet (10 mg total) by mouth 2 (two) times daily as needed (anixety). Take 4-6 hours after the 20 mg dose at the earliest 60 tablet 1    propranolol (INDERAL) 20 MG tablet TAKE 1 TABLET(20 MG) BY MOUTH DAILY 90 tablet 3   pseudoephedrine (SUDAFED) 60 MG tablet Take 1 tablet (60 mg total) by mouth every 8 (eight) hours as needed for congestion. 30 tablet 0   triamcinolone cream (KENALOG) 0.5 % Apply 1 Application topically 2 (two) times daily. 15 g 0   No current facility-administered medications for this visit.     Psychiatric Specialty Exam: Review of Systems  Last menstrual period 11/03/2022.There is no height or weight on file to calculate BMI.  General Appearance: Fairly Groomed  Eye Contact:  Good  Speech:  Clear and Coherent  Volume:  Normal  Mood:  Anxious, Euthymic, and Irritable  Affect:  Labile  Thought Process:  Coherent  Orientation:  Full (Time, Place, and Person)  Thought Content: Logical   Suicidal Thoughts:  No  Homicidal Thoughts:  No  Memory:  Immediate;   Good  Judgement:  Fair  Insight:  Fair  Psychomotor Activity:  Normal  Concentration:  Concentration: Fair  Recall:  Fair  Fund of Knowledge: Fair  Language: Good  Akathisia:  NA    AIMS (if indicated): not done  Assets:  Communication Skills Desire for Improvement Housing Talents/Skills Transportation Vocational/Educational  ADL's:  Intact  Cognition: WNL  Sleep:  Fair   Metabolic Disorder Labs: Lab Results  Component Value Date   HGBA1C 5.3 01/19/2020   MPG 105 12/07/2019   No results found for: "PROLACTIN" Lab Results  Component Value Date   CHOL 189 01/19/2020   TRIG 109 01/19/2020   HDL 47 01/19/2020   CHOLHDL  4.0 01/19/2020   VLDL 9 12/07/2019   LDLCALC 122 (H) 01/19/2020   LDLCALC 141 (H) 12/07/2019   Lab Results  Component Value Date   TSH 1.16 04/12/2021   TSH 0.50 01/20/2021    Therapeutic Level Labs: No results found for: "LITHIUM" No results found for: "VALPROATE" No results found for: "CBMZ"   Screenings: AIMS    Flowsheet Row Ad[facility] (Discharged) [facility]  AIMS Total Score 0      AUDIT    Flowsheet Row Admission (Discharged) from OP [facility]  Alcohol Use Disorder Identification Test Final Score (AUDIT) 1      GAD-7    Flowsheet Row Office Visit from 12/30/2020 in [facility] Visit from 01/19/2020 in [facility] Care at [facility]  Total GAD-7 Score 16 10      PHQ2-9    Flowsheet Row Counselor from 08/17/2022 in [facility] Visit from 03/01/2022 in [facility] from 08/01/2021 in [facility] Office Visit from 04/12/2021 in [facility] Visit from 01/20/2021 in [facility] Total Score 2 0 4 1 0  PHQ-9 Total Score 13 -- 20 8 --      Flowsheet Row ED from 11/23/2022 in [facility] ED from 10/18/2022 in [facility] ED from 09[facility]05/2022 in [facility])  C-SSRS RISK CATEGORY No Risk No Risk No Risk       Collaboration of Care: Collaboration of Care: Medication Management AEB medication prescription and Referral or follow-up with counselor/therapist AEB chart review  Patient/Guardian was advised Release of Information must be obtained prior to any record release in order to collaborate their care with an outside provider. Patient/Guardian was advised if they have not already done so to contact the registration department to sign all necessary forms in order for [place] to release information regarding their care.   Consent: Patient/Guardian gives verbal consent for treatment and assignment of benefits for services provided during this visit. Patient/Guardian expressed understanding and agreed to  proceed.    Stasia Cavalier, MD 12/19/2022, 4:31 PM   Virtual Visit via Video Note  I connected with Kimberly Harper on 12/19/22 at  4:00 PM EDT by a video enabled telemedicine application and verified that I am speaking with the correct person using two identifiers.  Location: Patient: Home Provider: Home Office   I discussed the limitations of evaluation and management by telemedicine and the availability of in person appointments. The patient expressed understanding and agreed to proceed.   I discussed the assessment and treatment plan with the patient. The patient was provided an opportunity to ask questions and all were answered. The patient agreed with the plan and demonstrated an understanding of the instructions.   The patient was advised to call back or seek an in-person evaluation if the symptoms worsen or if the condition fails to improve as anticipated.  I provided 20 minutes of non-face-to-face time during this encounter.   Stasia Cavalier, MD

## 2022-12-20 ENCOUNTER — Ambulatory Visit (INDEPENDENT_AMBULATORY_CARE_PROVIDER_SITE_OTHER): Payer: Medicare Other | Admitting: Clinical

## 2022-12-20 ENCOUNTER — Encounter (HOSPITAL_COMMUNITY): Payer: Self-pay | Admitting: Clinical

## 2022-12-20 DIAGNOSIS — F411 Generalized anxiety disorder: Secondary | ICD-10-CM

## 2022-12-20 DIAGNOSIS — F603 Borderline personality disorder: Secondary | ICD-10-CM | POA: Diagnosis not present

## 2022-12-20 DIAGNOSIS — F3181 Bipolar II disorder: Secondary | ICD-10-CM

## 2022-12-20 NOTE — Progress Notes (Signed)
THERAPIST PROGRESS NOTE  Session Time: 4:00-4:59pm  Session #8  Participation Level: Active  Behavioral Response: Casual, Alert, Anxious  Type of Therapy: Individual Therapy  Treatment Goals addressed:  LTG: Kimberly Harper will score less than 5 on the Generalized Anxiety Disorder 7 Scale (GAD-7)  STG: Report feeling more positive about self and abilities during therapy sessions  LTG: Reduce frequency, intensity, and duration of depression symptoms so that daily functioning is improved  STG: Get 7-8 hours of restful sleep every night  LTG: Kimberly Harper will reduce frequency of impulsive behaviors as evidenced by self-report of cutting behaviors and relationship sabotage  STG: Kimberly Harper will identify at least 5 personal goals for emotion regulation  STG: Kimberly Harper will formulate a safety plan including warning signs, coping skills, and supports   ProgressTowards Goals: Progressing  Interventions: CBT, Assertiveness Training, and Supportive  Summary: Kimberly Harper is a 37 y.o. female who presents for therapy for her Borderline Personality Disorder, Generalized Anxiety Disorder, and Bipolar 2 (current episode depressive). She immediately showed her engagement ring and wedding band and announced they had gone through with the wedding at the courthouse and that things are much better with her husband than they were before.  She stated he is "completely different, helpful and happy" now.  In particular he is showing concern about her in a way he did not do before.  We talked at length about the other changes in her life, the efforts she is making to get her mother and son into their own place so she can move in with husband and not keep her house.  She does feel stressed by cooking and cleaning at both homes.  Much of the session was spent talking about her new job of 3 weeks and how frustrated she is at the lack of organization, both in the organization as well as in her training.  It was difficult  for her to see both sides as Kimberly Harper tried to get her to think about other possible meanings aside from her first impressions that make her angry.  For instance she said at one point, she wanted to ask the supervisor, "Are you sure you want me to do me, because you're not going to like it."  Kimberly Harper advised her to take notes for awhile and bring up possible change ideas later when she knows more.  Her cognitive distortions seemed quite significant surrounding this topic and she did not seem able to entertain the idea that there would be a possible different explanation for why her training is going the way it is.  Kimberly Harper took her through the CBT model again briefly and she was able to identify that her usual feeling around other people is that she is being judged and always feels she is doing something wrong.  It was explained how this then leads to subsequent thoughts and feelings that make her behave the way she does, often in anger and judgmental ways herself.  She was insightful in saying that even though her depression is improved lately, her anxiety is a challenge right now because there have been a lot of changes in her life all at the same time.  Suicidal/Homicidal: No without intent/plan  Therapist Response: Patient is progressing AEB growing insight into her own cognitive distortions.  She does recognize that her recent changes are many and varied, and that these surely have negatively impacted her even though they are positive changes.   Kimberly Harper gave patient the opportunity to explore thoughts and feelings associated  with current life situations and past/present external stressors as desired.   Kimberly Harper encouraged patient's expression of feelings and validated patient's thoughts, using empathy, active listening, open body language, and unconditional positive regard.  Patient demonstrated an orientation to time, place, person and situation.       Plan: Return again in 1 week Next appointment:   6/20  Recommendations:  Return to therapy in 1 week, engage in self care behaviors, consider cognitive distortions and CBT model as described in session  Diagnosis:  Encounter Diagnoses  Name Primary?   Bipolar 2 disorder, major depressive episode (HCC) Yes   GAD (generalized anxiety disorder)    Borderline personality disorder (HCC)    Collaboration of Care: Psychiatrist AEB - therapist and psychiatric provider can read notes in Epic as needed    Patient/Guardian was advised Release of Information must be obtained prior to any record release in order to collaborate their care with an outside provider. Patient/Guardian was advised if they have not already done so to contact the registration department to sign all necessary forms in order for [place] to release information regarding their care.   Consent: Patient/Guardian gives verbal consent for treatment and assignment of benefits for services provided during this visit. Patient/Guardian expressed understanding and agreed to proceed.    Lynnell Chad, LCSW 12/20/2022

## 2022-12-27 ENCOUNTER — Ambulatory Visit (HOSPITAL_COMMUNITY): Payer: Medicare Other | Admitting: Clinical

## 2023-01-10 ENCOUNTER — Ambulatory Visit
Admission: EM | Admit: 2023-01-10 | Discharge: 2023-01-10 | Discharge status: Against Medical Advice | Payer: Medicare Other

## 2023-01-10 DIAGNOSIS — M542 Cervicalgia: Secondary | ICD-10-CM | POA: Diagnosis not present
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
## 2023-01-11 ENCOUNTER — Ambulatory Visit (INDEPENDENT_AMBULATORY_CARE_PROVIDER_SITE_OTHER): Payer: Medicare Other | Admitting: Clinical

## 2023-01-11 ENCOUNTER — Encounter (HOSPITAL_COMMUNITY): Payer: Self-pay | Admitting: Clinical

## 2023-01-11 DIAGNOSIS — F603 Borderline personality disorder: Secondary | ICD-10-CM

## 2023-01-11 DIAGNOSIS — F3181 Bipolar II disorder: Secondary | ICD-10-CM | POA: Diagnosis not present

## 2023-01-11 DIAGNOSIS — F411 Generalized anxiety disorder: Secondary | ICD-10-CM | POA: Diagnosis not present
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
## 2023-01-12 ENCOUNTER — Emergency Department (HOSPITAL_BASED_OUTPATIENT_CLINIC_OR_DEPARTMENT_OTHER)
Admission: EM | Admit: 2023-01-12 | Discharge: 2023-01-12 | Disposition: A | Discharge status: Home / Self Care | Payer: Medicare Other | Attending: Emergency Medicine | Admitting: Emergency Medicine

## 2023-01-12 ENCOUNTER — Emergency Department (HOSPITAL_BASED_OUTPATIENT_CLINIC_OR_DEPARTMENT_OTHER): Payer: Medicare Other

## 2023-01-12 DIAGNOSIS — M542 Cervicalgia: Secondary | ICD-10-CM | POA: Diagnosis not present

## 2023-01-12 LAB — BASIC METABOLIC PANEL
Anion gap: 6 (ref 5–15)
BUN: 9 mg/dL (ref 6–20)
CO2: 31 mmol/L (ref 22–32)
Calcium: 9.7 mg/dL (ref 8.9–10.3)
Chloride: 104 mmol/L (ref 98–111)
Creatinine, Ser: 0.86 mg/dL (ref 0.44–1.00)
GFR, Estimated: >60 mL/min (ref >60)
Glucose, Bld: 102 mg/dL — ABNORMAL HIGH (ref 70–99)
Potassium: 3.9 mmol/L (ref 3.5–5.1)
Sodium: 141 mmol/L (ref 135–145)

## 2023-01-12 MED ORDER — IOHEXOL 350 MG/ML SOLN
100.0000 mL | Freq: Once | INTRAVENOUS | Status: AC | PRN
  Administered 2023-01-12: 75 mL via INTRAVENOUS
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
## 2023-01-12 NOTE — ED Notes (Signed)
Patient verbalizes understanding of discharge instructions. Opportunity for questioning and answers were provided. Patient discharged from ED.  °
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
## 2023-01-13 ENCOUNTER — Other Ambulatory Visit: Payer: Self-pay | Admitting: Internal Medicine

## 2023-01-13 DIAGNOSIS — K219 Gastro-esophageal reflux disease without esophagitis: Secondary | ICD-10-CM

## 2023-01-14 ENCOUNTER — Other Ambulatory Visit (HOSPITAL_COMMUNITY): Payer: Self-pay | Admitting: Psychiatry

## 2023-01-14 DIAGNOSIS — F411 Generalized anxiety disorder: Secondary | ICD-10-CM

## 2023-01-15 IMAGING — US US ABDOMEN COMPLETE
1 series · 14 of 25 positions shown · non-contrast
Comparison: None.

CLINICAL DATA: Abdominal pain, nausea

EXAM:
ABDOMEN ULTRASOUND COMPLETE

[Series 1: us abdomen complete · 14 of 88 slices shown]
[im 1/88]
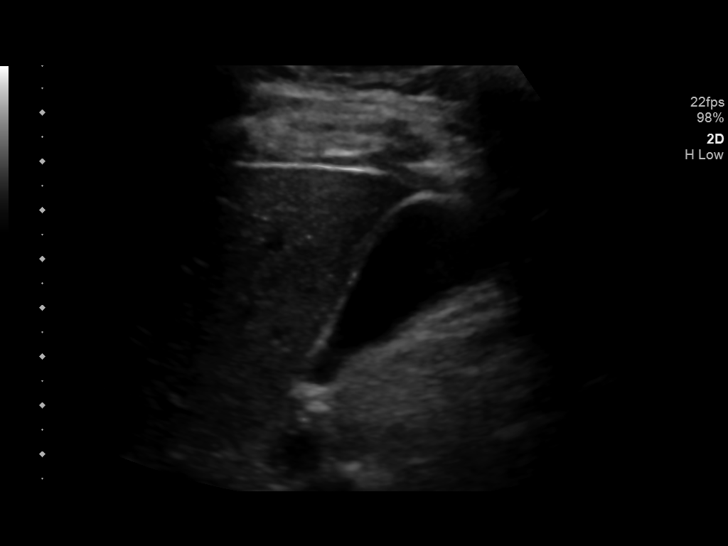
[im 8/88]
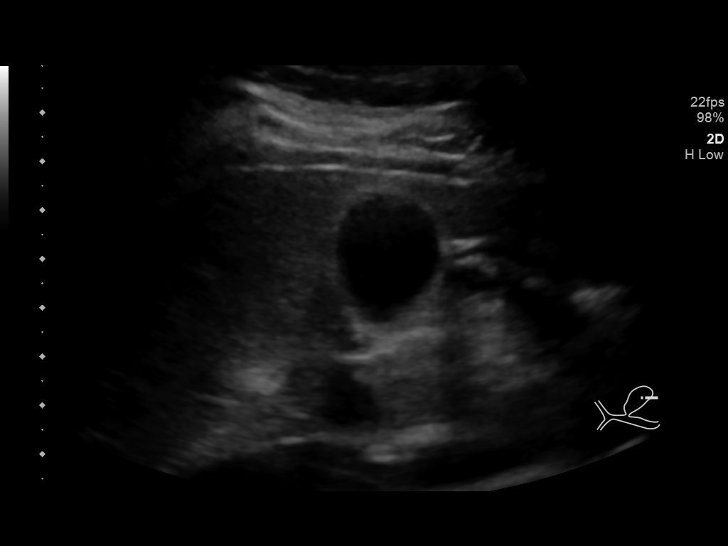
[im 15/88]
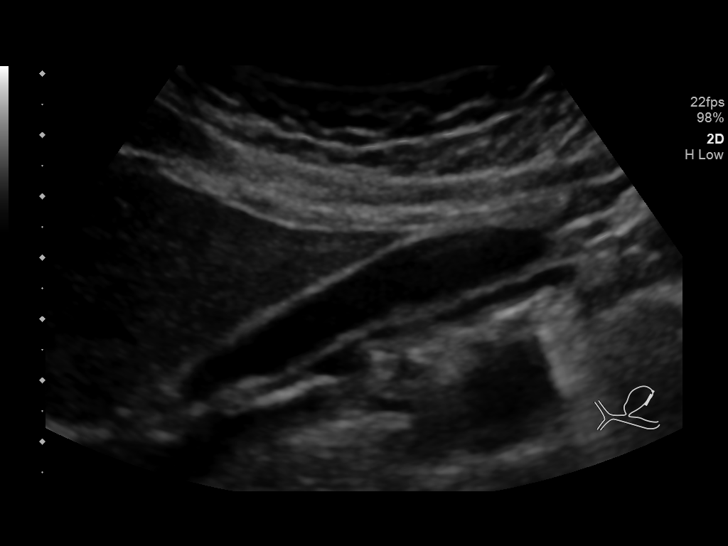
[im 22/88]
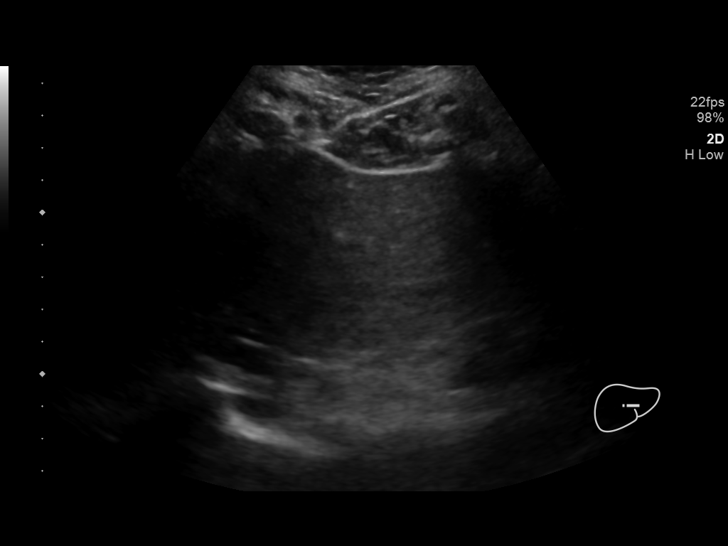
[im 30/88]
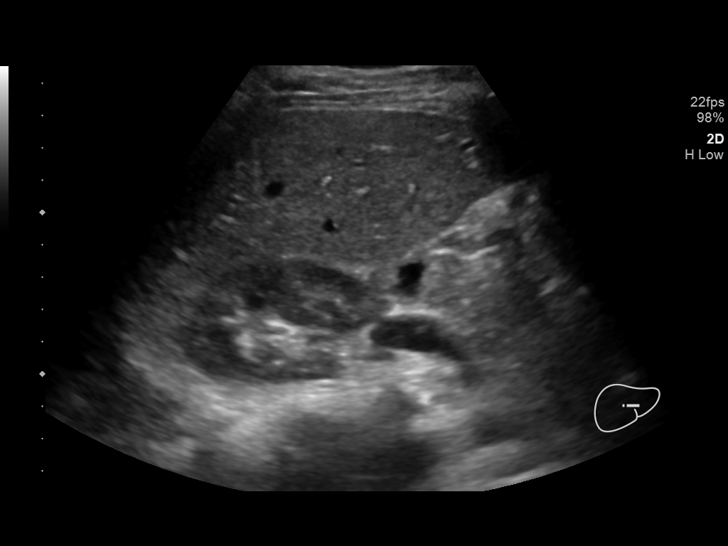
[im 33/88]
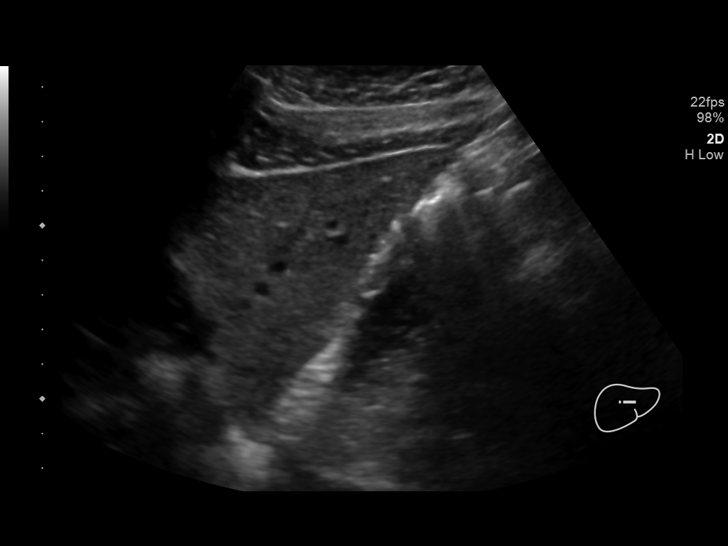
[im 40/88]
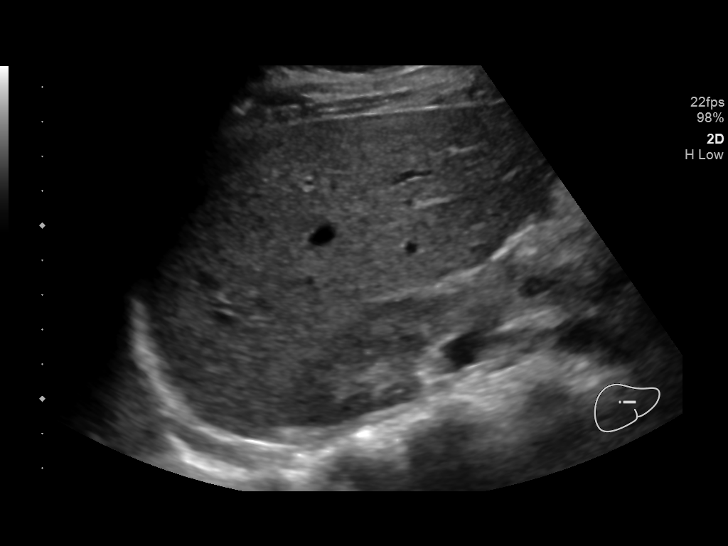
[im 48/88]
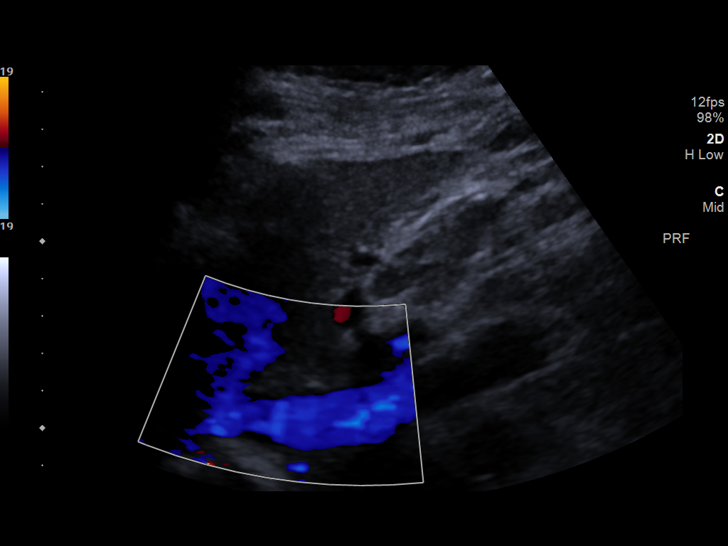
[im 55/88]
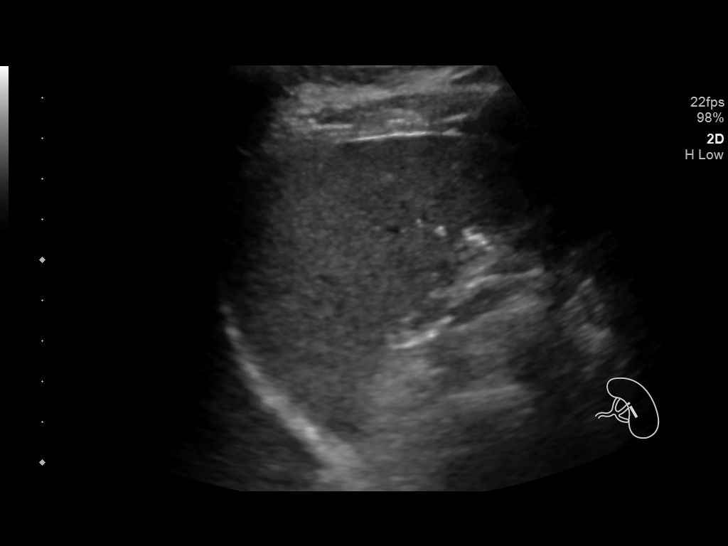
[im 59/88]
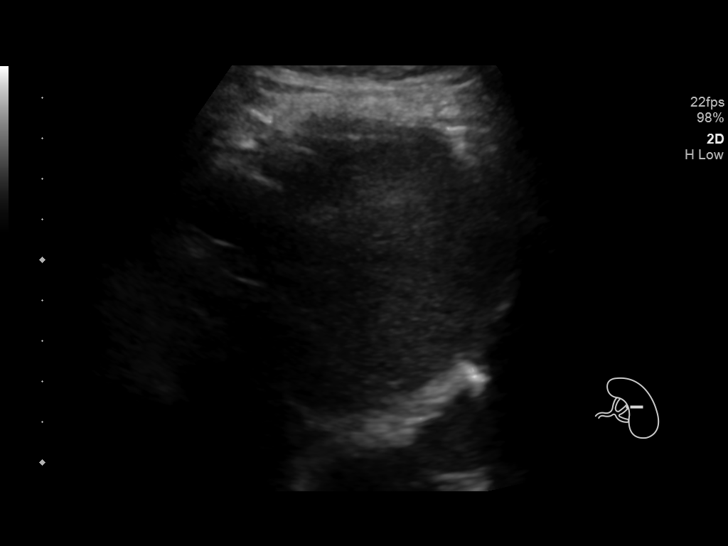
[im 66/88]
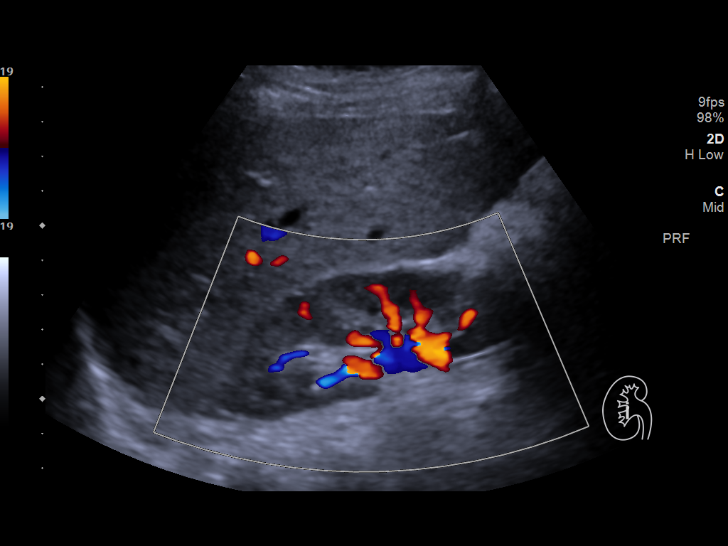
[im 73/88]
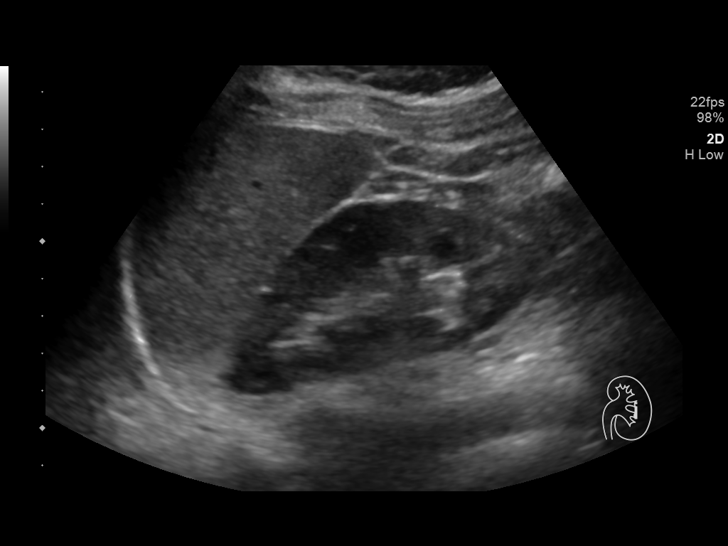
[im 80/88]
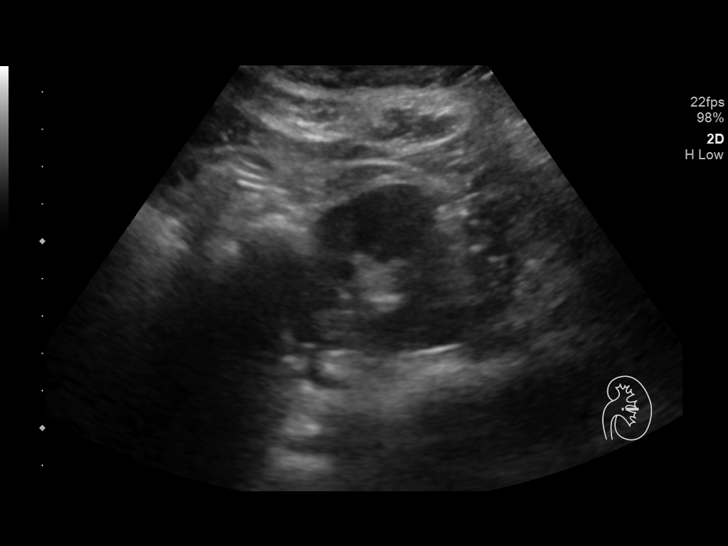
[im 88/88]
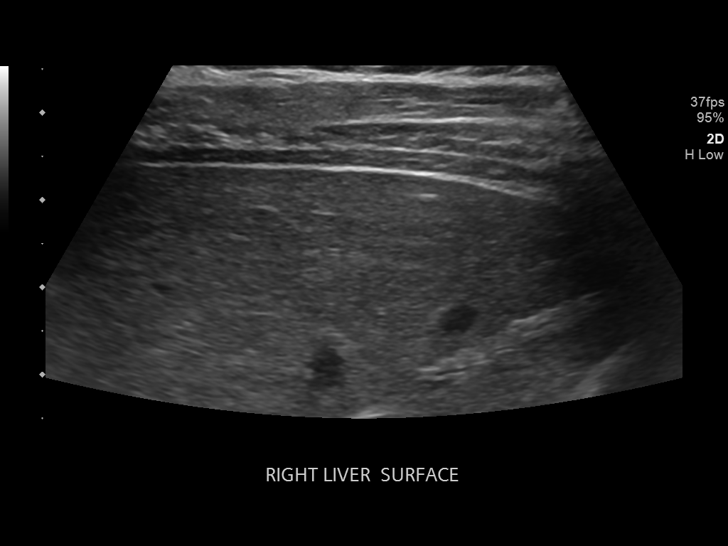

[14 of 25 positions shown; findings below may reference images not displayed]

FINDINGS: Gallbladder: No gallstones or wall thickening visualized. No
sonographic Murphy sign noted by sonographer.

Common bile duct: Diameter: 0.3 cm, within normal limits

Liver: No focal lesion identified. Parenchymal echogenicity is
slightly increased. Portal vein is patent on color Doppler imaging
with normal direction of blood flow towards the liver.

IVC: No abnormality visualized.

Pancreas: Visualized portion unremarkable.

Spleen: Size and appearance within normal limits.

Right Kidney: Length: 9.5 cm. Echogenicity within normal limits. No
mass or hydronephrosis visualized.

Left Kidney: Length: 9.5 cm. Echogenicity within normal limits. No
mass or hydronephrosis visualized.

Abdominal aorta: No aneurysm visualized.

Other findings: None.
IMPRESSION: 1.  No acute finding sonographically in the abdomen.

2. Mildly increased liver parenchymal echogenicity which is
nonspecific but most commonly seen with hepatic steatosis.

## 2023-01-30 ENCOUNTER — Encounter (HOSPITAL_COMMUNITY): Payer: Self-pay

## 2023-01-30 ENCOUNTER — Telehealth (HOSPITAL_BASED_OUTPATIENT_CLINIC_OR_DEPARTMENT_OTHER): Payer: Medicare Other | Admitting: Psychiatry

## 2023-01-30 ENCOUNTER — Other Ambulatory Visit (HOSPITAL_COMMUNITY): Payer: Self-pay | Admitting: Psychiatry

## 2023-01-30 DIAGNOSIS — F3181 Bipolar II disorder: Secondary | ICD-10-CM | POA: Diagnosis not present

## 2023-01-30 DIAGNOSIS — F411 Generalized anxiety disorder: Secondary | ICD-10-CM

## 2023-01-30 DIAGNOSIS — F603 Borderline personality disorder: Secondary | ICD-10-CM

## 2023-01-30 MED ORDER — PROPRANOLOL HCL 10 MG PO TABS: 10.0000 mg | ORAL_TABLET | Freq: Two times a day (BID) | ORAL | 1 refills | Status: DC | PRN

## 2023-01-30 MED ORDER — PRAMIPEXOLE DIHYDROCHLORIDE 0.25 MG PO TABS: ORAL_TABLET | ORAL | 0 refills | Status: DC
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
## 2023-01-31 ENCOUNTER — Ambulatory Visit (INDEPENDENT_AMBULATORY_CARE_PROVIDER_SITE_OTHER): Payer: Medicare Other | Admitting: Clinical

## 2023-01-31 DIAGNOSIS — F3181 Bipolar II disorder: Secondary | ICD-10-CM | POA: Diagnosis not present

## 2023-01-31 DIAGNOSIS — F603 Borderline personality disorder: Secondary | ICD-10-CM | POA: Diagnosis not present

## 2023-01-31 DIAGNOSIS — F411 Generalized anxiety disorder: Secondary | ICD-10-CM

## 2023-02-01 ENCOUNTER — Encounter (HOSPITAL_COMMUNITY): Payer: Self-pay | Admitting: Clinical

## 2023-02-01 NOTE — Progress Notes (Signed)
THERAPIST PROGRESS NOTE  Session Time: 4:00-5:00pm  Session #10  Participation Level: Active  Behavioral Response: Casual, Alert, Negative  Type of Therapy: Individual Therapy  Treatment Goals addressed:  LTG: Kimberly Harper will score less than 5 on the Generalized Anxiety Disorder 7 Scale (GAD-7)  STG: Report feeling more positive about self and abilities during therapy sessions  LTG: Reduce frequency, intensity, and duration of depression symptoms so that daily functioning is improved  STG: Get 7-8 hours of restful sleep every night  LTG: Kimberly Harper will reduce frequency of impulsive behaviors as evidenced by self-report of cutting behaviors and relationship sabotage  STG: Kimberly Harper will identify at least 5 personal goals for emotion regulation  STG: Kimberly Harper will formulate a safety plan including warning signs, coping skills, and supports   ProgressTowards Goals: Progressing  Interventions: CBT and Supportive   Summary: Kimberly Harper is a 37 y.o. female who presents for therapy for her Borderline Personality Disorder, Generalized Anxiety Disorder, and Bipolar 2 (current episode depressive). She shared that she has tried cleaning the house she rents in which her mother and son are living.  She showed CSW pictures she had taken of the condition of the home, which showed quite a bit of neglect and lack of basic hygiene.  There was urine all over the floor since her mother does not apparently even try to make it to a toilet.  This made the floor slippery and of course quite malodorous.  There were multiple pans and dishes with uneaten food and bugs which she chose to throw away rather than try to clean/salvage them.  She had gotten behind on her rent, so her mother helped to get caught up last month.  As a result she does not feel she can just make her mother leave, and her mother would not allow her to make son leave.  However, she did tell mother they were close to being evicted at that  time, which she believes has induced mother to start looking for an apartment for herself elsewhere.  She does not want her son to move with mother and burden her.  CSW encouraged her to remember what she can and cannot control in the situation.  She is going to move her possessions (that are not to be thrown away because of the exposure to such poor conditions) to her new home with husband.  She was encouraged to take a load each time she goes, as that will encourage mother and son to look for their next step.  We processed what is happening at her job, the frustrations she has with her pay as well as how little she feels they are helping people.  We talked about this in context of her doing what she can without going overboard with creating new things in her off-work hours that will help at work.  She talked about how she is struggling to "refind" herself.  She identified the problem as "not standing up" for herself, which she used to do.  We processed this as well as the ongoing issues with husband's "other family" and how she is excluded from decisions about anything related to the daughter even though she has to interact with that child when she visits their home and the money outlay of large decisions impacts her own life, not just husband's.    Suicidal/Homicidal: No without intent/plan  Therapist Response: Patient is progressing AEB engaging in scheduled therapy session.  She presented oriented x5.  CSW evaluated patient's medication compliance and  self-care since last session.  We processed various areas of her life throughout the session and she was provided encouragement, reminded re cognitive distortions as they were used.  CSW specifically apologized that she continues to push the idea, but explained why this is.  Throughout the session, CSW gave patient the opportunity to explore thoughts and feelings associated with current life situations and past/present external stressors.   CSW  encouraged patient's expression of feelings and validated patient's thoughts using empathy, active listening, open body language, and unconditional positive regard.   Specific DBT skills need to be introduced at next session       Plan: Return again in 2 weeks Next appointment:  7/24  Recommendations:  Return to therapy in 2 weeks, engage in self care behaviors especially with sleep, continue to consider cognitive distortions as applied to herself and her husband  Diagnosis:  Encounter Diagnoses  Name Primary?   Borderline personality disorder (HCC) Yes   Bipolar 2 disorder, major depressive episode (HCC)    Generalized anxiety disorder     Collaboration of Care: Psychiatrist AEB - therapist and psychiatric provider can read notes in Epic as needed    Patient/Guardian was advised Release of Information must be obtained prior to any record release in order to collaborate their care with an outside provider. Patient/Guardian was advised if they have not already done so to contact the registration department to sign all necessary forms in order for [place] to release information regarding their care.   Consent: Patient/Guardian gives verbal consent for treatment and assignment of benefits for services provided during this visit. Patient/Guardian expressed understanding and agreed to proceed.    Lynnell Chad, LCSW 02/01/2023

## 2023-02-09 ENCOUNTER — Ambulatory Visit
Admission: EM | Admit: 2023-02-09 | Discharge: 2023-02-09 | Disposition: A | Discharge status: Home / Self Care | Payer: Medicare Other | Source: Home / Self Care | Attending: Family Medicine | Admitting: Family Medicine

## 2023-02-09 DIAGNOSIS — N760 Acute vaginitis: Secondary | ICD-10-CM | POA: Diagnosis not present

## 2023-02-09 MED ORDER — METRONIDAZOLE 500 MG PO TABS: 500.0000 mg | ORAL_TABLET | Freq: Two times a day (BID) | ORAL | 0 refills | Status: AC

## 2023-02-09 NOTE — Discharge Instructions (Signed)
Take metronidazole 500 mg--1 tablet 2 times daily for 7 days.  Avoid drinking alcohol within 72 hours of taking this medication  Staff will notify of any positives on the swab.

## 2023-02-09 NOTE — ED Triage Notes (Signed)
Patient states she thinks she has BV. States she has had some abnormal discharge and there is an odor that isn't usually there. States she is currently on her period.

## 2023-02-09 NOTE — ED Provider Notes (Addendum)
[name]    CSN: [id number] Arrival date & time: 02/09/23  1724      History   Chief Complaint Chief Complaint  Patient presents with   Vaginal Itching    HPI Kimberly Harper is a 37 y.o. female.    Vaginal Itching  Here for vaginal odor and some irritation.  She feels that is most consistent like when she has had BV.  No abdominal pain or fever or vomiting  Last menstrual cycle was July 10.  The above symptoms began about July 9 or so.  Past Medical History:  Diagnosis Date   Alcoholic (HCC)    Anemia    Phreesia 01/17/2020   Anxiety    Asthma    Asthma 06/22/2021   Bipolar 1 disorder (HCC)    Blood transfusion without reported diagnosis    Phreesia 01/17/2020   Chest pain 03/01/2022   03/2021 xr negative.   Chronic headaches    Depression    Phreesia 01/17/2020   Lactose intolerance 03/01/2022   Major depressive disorder    Nausea 03/01/2022   Pregnancy, location unknown 11/21/2021   Formatting of this note might be different from the original. [facility] Quant Book Joelys Maguire Language: English There is no such number on file (mobile). [phone] (home) [address] [place] [address] No e-mail address on record  [facility] (7th floor [place], [address]): M-F 7:30-18:00 [facility]: M-F 8:00-17:30 [facility]: M-F 8:30-17:00 (cl   Substance abuse (HCC)    Phreesia 01/17/2020    Patient Active Problem List   Diagnosis Date Noted   Chest injuries 07/20/2022   Vaginal pain 06/13/2022   Abnormal uterine bleeding (AUB) 03/13/2022   Nausea 03/01/2022   Chest pain 03/01/2022   Lactose intolerance 03/01/2022   Rash 03/01/2022   Epigastric pain 02/08/2022   Other allergic rhinitis 06/22/2021   Heartburn 06/22/2021   Panic disorder 04/12/2020   GAD (generalized anxiety disorder) 03/18/2020   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   Borderline personality disorder (HCC) 02/06/2020   Bipolar 2  disorder, major depressive episode (HCC) 12/06/2019    Past Surgical History:  Procedure Laterality Date   CESAREAN SECTION N/A    Phreesia 01/17/2020   COLONOSCOPY     earlier in year 2022   DILATION AND CURETTAGE OF UTERUS     HAND SURGERY Right    WISDOM TOOTH EXTRACTION      OB History     Gravida  4   Para  1   Term      Preterm  1   AB  3   Living  1      SAB  3   IAB      Ectopic      Multiple      Live Births  1            Home Medications    Prior to Admission medications   Medication Sig Start Date End Date Taking? Authorizing Provider  metroNIDAZOLE (FLAGYL) 500 MG tablet Take 1 tablet (500 mg total) by mouth 2 (two) times daily for 7 days. 02/09/23 02/16/23 Yes Rosena Bartle, Janace Aris, MD  albuterol (VENTOLIN HFA) 108 (90 Base) MCG/ACT inhaler Inhale 1-2 puffs into the lungs every 6 (six) hours as needed for wheezing or shortness of breath. 06/08/22   Wallis Bamberg, PA-C  pantoprazole (PROTONIX) 40 MG tablet TAKE 1 TABLET(40 MG) BY MOUTH DAILY 01/14/23   Jon Billings,  Johnella Moloney, MD  pramipexole (MIRAPEX) 0.25 MG tablet TAKE 1 TABLET(0.25 MG) BY MOUTH THREE TIMES DAILY 01/30/23   Stasia Cavalier, MD  propranolol (INDERAL) 10 MG tablet TAKE 1 TABLET BY MOUTH TWICE DAILY AS NEEDED FOR ANXIETY(TAKE 4 TO 6 HOURS AFTER THE 20MG  DOSE AT THE EARLIEST) 01/30/23   Stasia Cavalier, MD  propranolol (INDERAL) 20 MG tablet TAKE 1 TABLET(20 MG) BY MOUTH DAILY 07/20/22   Lula Olszewski, MD    Family History Family History  Problem Relation Age of Onset   Hypertension Mother    Diabetes Mother    Stroke Mother    Depression Mother    Depression Sister    Irritable bowel syndrome Sister    Asthma Brother    Allergic rhinitis Brother    Migraines Brother    Depression Brother    Stroke Maternal Grandmother    Diabetes Maternal Grandmother    Alcohol abuse Maternal Grandfather    Stroke Paternal Grandmother    Breast cancer Paternal Grandmother    Alcohol abuse  Paternal Grandfather    Lupus Half-Sister    Angioedema Neg Hx    Eczema Neg Hx    Immunodeficiency Neg Hx    Atopy Neg Hx     Social History Social History   Tobacco Use   Smoking status: Former    Current packs/day: 0.00    Types: Cigarettes    Quit date: 09/07/2006    Years since quitting: 16.4   Smokeless tobacco: Never  Vaping Use   Vaping status: Never Used  Substance Use Topics   Alcohol use: Yes    Comment: occ   Drug use: Not Currently     Allergies   Multiple vitamins-iron and Prednisone   Review of Systems Review of Systems   Physical Exam Triage Vital Signs ED Triage Vitals  Encounter Vitals Group     BP 02/09/23 1844 123/71     Systolic BP Percentile --      Diastolic BP Percentile --      Pulse Rate 02/09/23 1844 67     Resp 02/09/23 1844 16     Temp 02/09/23 1844 98.6 F (37 C)     Temp Source 02/09/23 1844 Oral     SpO2 02/09/23 1844 99 %     Weight --      Height --      Head Circumference --      Peak Flow --      Pain Score 02/09/23 1850 0     Pain Loc --      Pain Education --      Exclude from Growth Chart --    No data found.  Updated Vital Signs BP 123/71 (BP Location: Right Arm)   Pulse 67   Temp 98.6 F (37 C) (Oral)   Resp 16   LMP 01/31/2023 (Within Days)   SpO2 99%   Visual Acuity Right Eye Distance:   Left Eye Distance:   Bilateral Distance:    Right Eye Near:   Left Eye Near:    Bilateral Near:     Physical Exam Vitals reviewed.  Skin:    Coloration: Skin is not pale.  Neurological:     Mental Status: She is alert and oriented to person, place, and time.  Psychiatric:        Behavior: Behavior normal.      UC Treatments / Results  Labs (all labs ordered are listed, but only abnormal results  are displayed) Labs Reviewed  CERVICOVAGINAL ANCILLARY ONLY    EKG   Radiology No results found.  Procedures Procedures (including critical care time)  Medications Ordered in UC Medications - No  data to display  Initial Impression / Assessment and Plan / UC Course  I have reviewed the triage vital signs and the nursing notes.  Pertinent labs & imaging results that were available during my care of the patient were reviewed by me and considered in my medical decision making (see chart for details).        Self vaginal swab is done.  Will notify her and treat per protocol any positives.  We decided to go ahead and treat empirically with metronidazole. Final Clinical Impressions(s) / UC Diagnoses   Final diagnoses:  Acute vaginitis     Discharge Instructions      Take metronidazole 500 mg--1 tablet 2 times daily for 7 days.  Avoid drinking alcohol within 72 hours of taking this medication  Staff will notify of any positives on the swab.     ED Prescriptions     Medication Sig Dispense Auth. Provider   metroNIDAZOLE (FLAGYL) 500 MG tablet Take 1 tablet (500 mg total) by mouth 2 (two) times daily for 7 days. 14 tablet Yarah Fuente, Janace Aris, MD      PDMP not reviewed this encounter.   Zenia Resides, MD 02/09/23 Julian Reil    Zenia Resides, MD 02/09/23 [phone]

## 2023-02-12 LAB — CERVICOVAGINAL ANCILLARY ONLY
Bacterial Vaginitis (gardnerella): POSITIVE — AB
Candida Glabrata: NEGATIVE
Candida Vaginitis: NEGATIVE
Chlamydia: NEGATIVE
Comment: NEGATIVE
Comment: NEGATIVE
Comment: NEGATIVE
Comment: NEGATIVE
Comment: NEGATIVE
Comment: NORMAL
Neisseria Gonorrhea: NEGATIVE
Trichomonas: NEGATIVE

## 2023-02-14 ENCOUNTER — Ambulatory Visit (HOSPITAL_COMMUNITY): Payer: Medicare Other | Admitting: Clinical

## 2023-02-16 ENCOUNTER — Telehealth (HOSPITAL_COMMUNITY): Payer: Self-pay | Admitting: Emergency Medicine

## 2023-02-16 MED ORDER — FLUCONAZOLE 150 MG PO TABS: 150.0000 mg | ORAL_TABLET | Freq: Once | ORAL | 0 refills | Status: AC

## 2023-02-16 NOTE — Telephone Encounter (Signed)
Patient requested antibiotic associated yeast medicine Will send per protocol Reviewed with patient, verified pharmacy, prescription sent

## 2023-02-16 NOTE — Telephone Encounter (Signed)
Patient left message with front desk for return call about medicines Attempted to reach patient x 1, LVM

## 2023-02-28 ENCOUNTER — Ambulatory Visit (INDEPENDENT_AMBULATORY_CARE_PROVIDER_SITE_OTHER): Payer: Medicare Other | Admitting: Clinical

## 2023-02-28 ENCOUNTER — Encounter (HOSPITAL_COMMUNITY): Payer: Self-pay | Admitting: Clinical

## 2023-02-28 DIAGNOSIS — F603 Borderline personality disorder: Secondary | ICD-10-CM | POA: Diagnosis not present

## 2023-02-28 DIAGNOSIS — F3181 Bipolar II disorder: Secondary | ICD-10-CM | POA: Diagnosis not present

## 2023-02-28 DIAGNOSIS — F411 Generalized anxiety disorder: Secondary | ICD-10-CM | POA: Diagnosis not present
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
## 2023-03-14 ENCOUNTER — Encounter (HOSPITAL_COMMUNITY): Payer: Self-pay | Admitting: Clinical

## 2023-03-14 ENCOUNTER — Ambulatory Visit (INDEPENDENT_AMBULATORY_CARE_PROVIDER_SITE_OTHER): Payer: Medicare Other | Admitting: Clinical

## 2023-03-14 DIAGNOSIS — F603 Borderline personality disorder: Secondary | ICD-10-CM | POA: Diagnosis not present

## 2023-03-14 DIAGNOSIS — F411 Generalized anxiety disorder: Secondary | ICD-10-CM

## 2023-03-14 DIAGNOSIS — F3181 Bipolar II disorder: Secondary | ICD-10-CM | POA: Diagnosis not present

## 2023-03-14 NOTE — Progress Notes (Signed)
THERAPIST PROGRESS NOTE  Session Time: 4:02-5:02pm  Session #12  Participation Level: Active  Behavioral Response: Casual, Lethargic, Depressed and Anxious and Worthless and Hopeless  Type of Therapy: Individual Therapy  Treatment Goals addressed:  LTG: Angeleena will score less than 5 on the Generalized Anxiety Disorder 7 Scale (GAD-7)  STG: Report feeling more positive about self and abilities during therapy sessions  LTG: Reduce frequency, intensity, and duration of depression symptoms so that daily functioning is improved  STG: Get 7-8 hours of restful sleep every night  LTG: Claritza will reduce frequency of impulsive behaviors as evidenced by self-report of cutting behaviors and relationship sabotage  STG: Eimi will identify at least 5 personal goals for emotion regulation  STG: Dakota will formulate a safety plan including warning signs, coping skills, and supports   ProgressTowards Goals: Progressing  Interventions: DBT, Supportive, and Anger Management Training   Summary: Kimberly Harper is a 37 y.o. female who presents for therapy for her Borderline Personality Disorder, Generalized Anxiety Disorder, and Bipolar 2 (current episode depressive). She feels that she is not doing well and said repeatedly that she needs to get back on track.  She and her husband are having financial issues because of her poor credit and impulsive spending.  He is working a lot of extra hours but this takes away from time with her.  She and her husband have not yet had time to "organize" their lives together and this is impacting her ability to feel settled.  We processed the way in which marriage is always changing and evolving and that they do not know each other all that well yet.  She kept coming back to the feeling that everything around her is chaos and she hates this.  She is afraid she will lose her job if she keeps leaving early for appointments and going on trips.  We explored the  number of things in her life that need her attention and how overwhelming it all seems.  CSW helped her to look at these items and pick out the easiest ones to take care of first to encourage herself and feel that she is making progress.  We processed her feelings that she is gaining weight and her attempts to diet, with CSW emphasizing that going without food as she had done at last session for several days is very unhealthy.  Finally, she talked about how much time she is spending crying and feeling disconnected from herself.  She is stuck in the feelings of hopelessness that she can change anything going on right now and thinks that it "may start getting worse."  This is usually what she says when she is considering hospitalization.  CSW encouraged her in the things that she can do, as above.  Last session, patient was provided with worksheets including DEAR MAN, GIVE, IMPROVE, and ACCEPT.  These were referred to throughout the session as a means to point out how this type of therapy can be help in her life by giving her time to think about her actions, taking more helpful actions, and make thoughtful decisions rather than merely reacting.  She was asked to examine these handouts and start to practice, then we will talk about this at next session.  This was not yet reviewed and should be at next session.  Told to journal about her current feelings of concern that she is going to "lose it."  Make lists of things that are worrying her in order to make them finite  and therefore manageable.  Suicidal/Homicidal: No without intent/plan  Therapist Response: Patient is progressing AEB engaging in scheduled therapy session.  She presented oriented x5 and stated she was feeling "up and down."  She stated her heads is not straight and she is "all over the place."  CSW evaluated patient's medication compliance and self-care since last session. Patient stated she usually takes her medication regularly, but when she is  going to drink alcohol, she will not take the medicine. This occurred 1-2 times in the last 2 weeks.  CSW taught CBT-related coping skills, specifically Scheduling Worry Time and Cognitive Distortions.  Patient received these willingly and could discuss them in a way to indicate fair but disinterested comprehension.    Throughout the session, CSW gave patient the opportunity to explore thoughts and feelings associated with current life situations and past/present external stressors.   CSW encouraged patient's expression of feelings and validated patient's thoughts using empathy, active listening, open body language, and unconditional positive regard.       Plan: Return again in 2 weeks Next appointment:  9/4  Recommendations:  Return to therapy in 2 weeks, engage in self care behaviors especially with sleep and with eating, work on the interpersonal effectiveness and distress tolerance skills worksheets provided in last session, journal about her feelings with regard to being so overwhelmed, make a list of things that need to be tended to in order to make it finite and more manageable.  Diagnosis:  Encounter Diagnoses  Name Primary?   Borderline personality disorder (HCC) Yes   Bipolar 2 disorder, major depressive episode (HCC)    Generalized anxiety disorder       Collaboration of Care: Psychiatrist AEB - therapist and psychiatric provider can read notes in Epic as needed    Patient/Guardian was advised Release of Information must be obtained prior to any record release in order to collaborate their care with an outside provider. Patient/Guardian was advised if they have not already done so to contact the registration department to sign all necessary forms in order for [place] to release information regarding their care.   Consent: Patient/Guardian gives verbal consent for treatment and assignment of benefits for services provided during this visit. Patient/Guardian expressed understanding and  agreed to proceed.    Lynnell Chad, LCSW 03/14/2023

## 2023-03-19 ENCOUNTER — Telehealth (HOSPITAL_COMMUNITY): Payer: Medicare Other | Admitting: Psychiatry

## 2023-03-19 NOTE — Progress Notes (Unsigned)
BH MD/PA/NP OP Progress Note  03/20/2023 9:30 AM Kimberly Harper  MRN:  [id number]  Visit Diagnosis:    ICD-10-CM   1. Borderline personality disorder (HCC)  F60.3     2. Generalized anxiety disorder  F41.1     3. Bipolar 2 disorder, major depressive episode (HCC)  F31.81          Assessment: Kimberly Harper is a 37 y.o. female with a history of borderline personality disorder, Bipolar 2 disorder, PTSD,  GAD, and panic disorder who presents virtually to [facility] for initial evaluation on 09/12/2022.  At initial evaluation patient reported symptoms of increased anxiety, fatigue, tearfulness, negative self thoughts, worthlessness, nonsuicidal self-injury, and intermittent SI without intent or plan.  Safety planning was discussed as well as alternative means to try manage nonsuicidal self-injury.  Patient had a significant past history of bipolar 2 disorder endorsed episodes of hypomania in the past which can include decreased sleep, increased energy, reckless and impulsive behaviors, and excessively elevated mood.  These episodes can last 1 to 3 days.  She denied any episodes of full mania.  Of note patient was also screened for borderline personality disorder which she has been diagnosed with in the past and did endorse real or perceived feelings of abandonment, chronic feelings of emptiness, splitting, impulsive behaviors, difficulty with interpersonal relationships, poor sense of self, and NSSI.  Patient met criteria for bipolar 2 disorder, borderline personality disorder, and generalized anxiety disorder.  While she does have a significant past history of trauma and some traits of PTSD she does not have enough to meet criteria.  Treatment options were discussed however patient notes trying a number of medications in the past with poor or sometimes negative effect including nausea, excessive sedation, or increased suicidality.  She was not  interested in starting a mood stabilizer at this time.  Due to the bipolar 2 diagnosis we would not start an antidepressant medication without some mood stabilization.  Patient was open to as needed medication for anxiety.   Kimberly Harper presents for follow-up evaluation. Today, 03/20/23, patient reports continuing to struggle with significant depression, anxiety, and irritability.  These are related to psychosocial and interpersonal stressors primarily with her partner.  Patient has had increased thoughts of self-harm though only acted on it by excessive alcohol consumption recently.  With the alcohol consumption she has stopped her medications again.  Patient is strongly recommended to dispose of her alcohol and to consistently continue on her medications.  We also will change propranolol to scheduled dosings at midday and dinner today for better compliance and anxiety relief.  Patient was encouraged to to work on the exercises given by her therapist.  She could also benefit from resuming couples counseling if partner agreeable.  We will follow up in 2 months.  Plan: - Continue pramipexole to 0.25 mg QD due to nausea  - Started Phenergan 5 mg Q8H prn for nausea, recommend f/u with PCP for continued management - Continue Propranolol 20 mg QD for migraines - Change Propranolol to 10 mg BID  - CMP, Beta HCG reviewed - Continue therapy with Ambrose Mantle - Recommend DBT - Will discuss LOA letter with patients therapist - Crisis resources reviewed - Follow up in a month  Chief Complaint:  Chief Complaint  Patient presents with   Follow-up   HPI: Kimberly Harper presents reporting that things are not going good at all. She is having a hard time getting herself together. Living  between the places and adjusting with her partner. There have been no arrangements to move her stuff, which is frustrating to her. She feels like everything is scattered and she can't get organized due to living out of  tow places. Patient says that it seems like every time she talks he interprets it as complaining, and they are having more problems between them now. They had tried couples counseling once, but he found out things he did not want to know about her and did not want to go back.  There was an incident this past weekend where [facility] was feeling emotionally overloaded and opted not to take her medications.  Instead she chose to drink as much alcohol as she could before getting sick.  Patient reports that the drinking was an attempt to punish herself in addition to hopefully make her partner feel bad and act empathetic.  This did not work out however and he just appeared to get more irritable with her.  Patient feels like she cannot do anything right and that she is only making those around her worse.  She does feel like self-harming would help her feel better though notes other than the drinking not engaging in any self-harm recently.  This is in part due to not having time or privacy to do so.  She has some interest in returning to her apartment for a few days to unwind which could be beneficial, especially since her mother is now with her sister.  Patient does however acknowledge the risk that returning to her own apartment could result in reengaging in self-harm.  We discussed coping strategies and grounding techniques and reviewed some of the information she had discussed for the therapist.  Patient notes that she has not worked on any of the worksheets that had been provided as they are not organized.  We suggested setting a goal to organize this area to hopefully allow her to start working on these.  It was also suggested to reconsider couples counseling though this does appear to depend partly on her partner.  As for medications patient does plan to restart them once the alcohol leaves her system.  We again reiterated the risk of alcohol use and its negative impact on depressed mood.  Suggested taking her  medication as an alternative.  Patient finds difficult to recall taking the propranolol in his as needed form and we will change it to scheduled dosing at midday and dinner.  Patient was in agreement with this and notes that when she does take propranolol it helps calm her anxiety symptoms.  Past Psychiatric History: Patient had seen Dr. Hinton Dyer in the past and the mood center more recently. She reports that she has been hospitalized multiple times including once in [place], several times in [place], and twice at [facility].  She has been through the [facility] in 2021 and again in 2023.  Has used Abilify, Seroquel (weight gain), Geodon, Latuda, Zoloft, Wellbutrin, Remeron, Celexa, trazodone, trintellix, BuSpar, propranolol, gabapentin, Atarax,  Prazosin, Lamictal, lithium, Xanax (benefit, but memory issues). A lot of these made her too nauseas or too sleepy.  Reports having 2-4 alcoholic drinks a couple times a month. Alsoe endorsed marijuana use in the past. Last in 2008. She denies any other substance use history.   Past Medical History:  Past Medical History:  Diagnosis Date   Alcoholic (HCC)    Anemia    Phreesia 01/17/2020   Anxiety    Asthma  Asthma 06/22/2021   Bipolar 1 disorder (HCC)    Blood transfusion without reported diagnosis    Phreesia 01/17/2020   Chest pain 03/01/2022   03/2021 xr negative.   Chronic headaches    Depression    Phreesia 01/17/2020   Lactose intolerance 03/01/2022   Major depressive disorder    Nausea 03/01/2022   Pregnancy, location unknown 11/21/2021   Formatting of this note might be different from the original. [facility] Quant Book Razia Brengle Language: English There is no such number on file (mobile). [phone] (home) [address] [place] [address] No e-mail address on record  Lab Hours [place] (7th floor [place], [address]): M-F 7:30-18:00 [facility]: M-F  8:00-17:30 [facility]: M-F 8:30-17:00 (cl   Substance abuse (HCC)    Phreesia 01/17/2020    Past Surgical History:  Procedure Laterality Date   CESAREAN SECTION N/A    Phreesia 01/17/2020   COLONOSCOPY     earlier in year 2022   DILATION AND CURETTAGE OF UTERUS     HAND SURGERY Right    WISDOM TOOTH EXTRACTION     Family History:  Family History  Problem Relation Age of Onset   Hypertension Mother    Diabetes Mother    Stroke Mother    Depression Mother    Depression Sister    Irritable bowel syndrome Sister    Asthma Brother    Allergic rhinitis Brother    Migraines Brother    Depression Brother    Stroke Maternal Grandmother    Diabetes Maternal Grandmother    Alcohol abuse Maternal Grandfather    Stroke Paternal Grandmother    Breast cancer Paternal Grandmother    Alcohol abuse Paternal Grandfather    Lupus Half-Sister    Angioedema Neg Hx    Eczema Neg Hx    Immunodeficiency Neg Hx    Atopy Neg Hx     Social History:  Social History   Socioeconomic History   Marital status: Single    Spouse name: Not on file   Number of children: 1   Years of education: Not on file   Highest education level: Not on file  Occupational History   Occupation: Academic librarian  Tobacco Use   Smoking status: Former    Current packs/day: 0.00    Types: Cigarettes    Quit date: 09/07/2006    Years since quitting: 16.5   Smokeless tobacco: Never  Vaping Use   Vaping status: Never Used  Substance and Sexual Activity   Alcohol use: Yes    Comment: occ   Drug use: Not Currently   Sexual activity: Yes    Birth control/protection: None  Other Topics Concern   Not on file  Social History Narrative   Right handed   Social Determinants of Health   Financial Resource Strain: Not on file  Food Insecurity: Not on file  Transportation Needs: Not on file  Physical Activity: Not on file  Stress: Not on file  Social Connections: Unknown (11/21/2021)   Received from  [facility]   Social Network    Social Network: Not on file    Allergies:  Allergies  Allergen Reactions   Multiple Vitamins-Iron Swelling    States that vitamins, folic acid and iron have caused swelling of tongue and gums   Prednisone Other (See Comments)    Altered mental status    Current Medications: Current Outpatient Medications  Medication Sig Dispense Refill  albuterol (VENTOLIN HFA) 108 (90 Base) MCG/ACT inhaler Inhale 1-2 puffs into the lungs every 6 (six) hours as needed for wheezing or shortness of breath. 18 g 0   pantoprazole (PROTONIX) 40 MG tablet TAKE 1 TABLET(40 MG) BY MOUTH DAILY 90 tablet 1   pramipexole (MIRAPEX) 0.25 MG tablet TAKE 1 TABLET(0.25 MG) BY MOUTH THREE TIMES DAILY 270 tablet 0   propranolol (INDERAL) 10 MG tablet TAKE 1 TABLET BY MOUTH TWICE DAILY AS NEEDED FOR ANXIETY(TAKE 4 TO 6 HOURS AFTER THE 20MG  DOSE AT THE EARLIEST) 180 tablet 0   propranolol (INDERAL) 20 MG tablet TAKE 1 TABLET(20 MG) BY MOUTH DAILY 90 tablet 3   No current facility-administered medications for this visit.     Psychiatric Specialty Exam: Review of Systems  There were no vitals taken for this visit.There is no height or weight on file to calculate BMI.  General Appearance: Fairly Groomed  Eye Contact:  Good  Speech:  Clear and Coherent  Volume:  Normal  Mood:  Anxious, Depressed, and Euthymic  Affect:  Labile and Tearful  Thought Process:  Coherent  Orientation:  Full (Time, Place, and Person)  Thought Content: Logical   Suicidal Thoughts:  No  Homicidal Thoughts:  No  Memory:  Immediate;   Good  Judgement:  Fair  Insight:  Lacking  Psychomotor Activity:  Normal  Concentration:  Concentration: Fair  Recall:  Fair  Fund of Knowledge: Fair  Language: Good  Akathisia:  NA    AIMS (if indicated): not done  Assets:  Communication Skills Desire for Improvement Housing Talents/Skills Transportation Vocational/Educational  ADL's:  Intact  Cognition: WNL   Sleep:  Fair   Metabolic Disorder Labs: Lab Results  Component Value Date   HGBA1C 5.3 01/19/2020   MPG 105 12/07/2019   No results found for: "PROLACTIN" Lab Results  Component Value Date   CHOL 189 01/19/2020   TRIG 109 01/19/2020   HDL 47 01/19/2020   CHOLHDL 4.0 01/19/2020   VLDL 9 12/07/2019   LDLCALC 122 (H) 01/19/2020   LDLCALC 141 (H) 12/07/2019   Lab Results  Component Value Date   TSH 1.16 04/12/2021   TSH 0.50 01/20/2021    Therapeutic Level Labs: No results found for: "LITHIUM" No results found for: "VALPROATE" No results found for: "CBMZ"   Screenings: AIMS    Flowsheet Row [facility] (Dis[facility]) [facility]  AIMS Total Score 0      AUDIT    Flowsheet Row Admission (Discharged) from OP [facility]  Alcohol Use Disorder Identification Test Final Score (AUDIT) 1      GAD-7    Flowsheet Row Office Visit from 12/30/2020 in [facility] Visit from 01/19/2020 in Primary Care at [facility]  Total GAD-7 Score 16 10      PHQ2-9    Flowsheet Row Counselor from 08/17/2022 in [facility] Visit from 03/01/2022 in [facility] from 08/01/2021 in [facility] Office Visit from 04/12/2021 in [facility] Visit from 01/20/2021 in [facility] Total Score 2 0 4 1 0  PHQ-9 Total Score 13 -- 20 8 --      Flowsheet Row ED from 02/09/2023 in [facility] at [facility]) ED from 01/12/2023  in [facility] Emergency Department at [facility] ED from 01/10/2023 in [facility] at [facility])  C-SSRS RISK CATEGORY Error: Question 6 not populated Error: Q3, 4,  or 5 should not be populated when Q2 is No High Risk       Collaboration of Care: Collaboration of Care: Medication Management AEB medication prescription and Referral or follow-up with counselor/therapist AEB chart review  Patient/Guardian was advised Release of Information must be obtained prior to any record release in order to collaborate their care with an outside provider. Patient/Guardian was advised if they have not already done so to contact the registration department to sign all necessary forms in order for [place] to release information regarding their care.   Consent: Patient/Guardian gives verbal consent for treatment and assignment of benefits for services provided during this visit. Patient/Guardian expressed understanding and agreed to proceed.    Stasia Cavalier, MD 03/20/2023, 9:30 AM   Virtual Visit via Video Note  I connected with Morton Stall on 03/20/23 at  8:00 AM EDT by a video enabled telemedicine application and verified that I am speaking with the correct person using two identifiers.  Location: Patient: Home Provider: Home Office   I discussed the limitations of evaluation and management by telemedicine and the availability of in person appointments. The patient expressed understanding and agreed to proceed.   I discussed the assessment and treatment plan with the patient. The patient was provided an opportunity to ask questions and all were answered. The patient agreed with the plan and demonstrated an understanding of the instructions.   The patient was advised to call back or seek an in-person evaluation if the symptoms worsen or if the condition fails to improve as anticipated.  I provided 30 minutes of non-face-to-face time during this encounter.   Stasia Cavalier, MD

## 2023-03-20 ENCOUNTER — Encounter (HOSPITAL_COMMUNITY): Payer: Self-pay | Admitting: Psychiatry

## 2023-03-20 ENCOUNTER — Telehealth (HOSPITAL_COMMUNITY): Payer: Medicare Other | Admitting: Psychiatry

## 2023-03-20 DIAGNOSIS — F603 Borderline personality disorder: Secondary | ICD-10-CM

## 2023-03-20 DIAGNOSIS — F3181 Bipolar II disorder: Secondary | ICD-10-CM | POA: Diagnosis not present

## 2023-03-20 DIAGNOSIS — F411 Generalized anxiety disorder: Secondary | ICD-10-CM

## 2023-03-28 ENCOUNTER — Ambulatory Visit (HOSPITAL_COMMUNITY): Payer: Medicare Other | Admitting: Clinical

## 2023-04-03 ENCOUNTER — Ambulatory Visit
Admission: EM | Admit: 2023-04-03 | Discharge: 2023-04-03 | Disposition: A | Discharge status: Home / Self Care | Payer: Medicare Other | Attending: Family Medicine | Admitting: Family Medicine

## 2023-04-03 DIAGNOSIS — U071 COVID-19: Secondary | ICD-10-CM

## 2023-04-03 DIAGNOSIS — Z3202 Encounter for pregnancy test, result negative: Secondary | ICD-10-CM | POA: Diagnosis not present

## 2023-04-03 LAB — POCT URINE PREGNANCY: Preg Test, Ur: NEGATIVE

## 2023-04-03 MED ORDER — PAXLOVID (300/100) 20 X 150 MG & 10 X 100MG PO TBPK: 3.0000 | ORAL_TABLET | Freq: Two times a day (BID) | ORAL | 0 refills | Status: AC

## 2023-04-03 MED ORDER — PROMETHAZINE-DM 6.25-15 MG/5ML PO SYRP: 5.0000 mL | ORAL_SOLUTION | Freq: Four times a day (QID) | ORAL | 0 refills | Status: DC | PRN

## 2023-04-03 MED ORDER — FLUTICASONE PROPIONATE 50 MCG/ACT NA SUSP: 1.0000 | Freq: Two times a day (BID) | NASAL | 2 refills | Status: AC

## 2023-04-03 NOTE — ED Provider Notes (Signed)
[facility]    CSN: [id number] Arrival date & time: 04/03/23  1006      History   Chief Complaint Chief Complaint  Patient presents with   Covid Positive    HPI Kimberly Harper is a 37 y.o. female.   Patient presenting today with 4-day history of nasal congestion, productive cough, sore throat, ear pain and pressure, fatigue.  Denies chest pain, shortness of breath, abdominal pain, nausea vomiting diarrhea, fever.  So far not tried anything over-the-counter for symptoms.  Home COVID test was positive.  She is also requesting a pregnancy test today as she is trying to get pregnant.  LMP was 03/04/2023.    Past Medical History:  Diagnosis Date   Alcoholic (HCC)    Anemia    Phreesia 01/17/2020   Anxiety    Asthma    Asthma 06/22/2021   Bipolar 1 disorder (HCC)    Blood transfusion without reported diagnosis    Phreesia 01/17/2020   Chest pain 03/01/2022   03/2021 xr negative.   Chronic headaches    Depression    Phreesia 01/17/2020   Lactose intolerance 03/01/2022   Major depressive disorder    Nausea 03/01/2022   Pregnancy, location unknown 11/21/2021   Formatting of this note might be different from the original. [facility] Quant Book Keatyn Burzinski Language: English There is no such number on file (mobile). [phone] (home) [address] [place] [address] No e-mail address on record  Lab Hours [place] (7th floor [place], [address]): M-F 7:30-18:00 [facility]: M-F 8:00-17:30 [facility]: M-F 8:30-17:00 (cl   Substance abuse (HCC)    Phreesia 01/17/2020    Patient Active Problem List   Diagnosis Date Noted   Chest injuries 07/20/2022   Vaginal pain 06/13/2022   Abnormal uterine bleeding (AUB) 03/13/2022   Nausea 03/01/2022   Chest pain 03/01/2022   Lactose intolerance 03/01/2022   Rash 03/01/2022   Epigastric pain 02/08/2022   Other allergic rhinitis 06/22/2021   Heartburn 06/22/2021   Panic disorder 04/12/2020    GAD (generalized anxiety disorder) 03/18/2020   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   Borderline personality disorder (HCC) 02/06/2020   Bipolar 2 disorder, major depressive episode (HCC) 12/06/2019    Past Surgical History:  Procedure Laterality Date   CESAREAN SECTION N/A    Phreesia 01/17/2020   COLONOSCOPY     earlier in year 2022   DILATION AND CURETTAGE OF UTERUS     HAND SURGERY Right    WISDOM TOOTH EXTRACTION      OB History     Gravida  4   Para  1   Term      Preterm  1   AB  3   Living  1      SAB  3   IAB      Ectopic      Multiple      Live Births  1            Home Medications    Prior to Admission medications   Medication Sig Start Date End Date Taking? Authorizing Provider  fluticasone (FLONASE) 50 MCG/ACT nasal spray Place 1 spray into both nostrils 2 (two) times daily. 04/03/23  Yes Particia Nearing, PA-C  nirmatrelvir & ritonavir (PAXLOVID, 300/100,) 20 x 150 MG & 10 x 100MG  TBPK Take 3 tablets by mouth 2 (two) times daily for 5 days. 04/03/23 04/08/23 Yes Particia Nearing, PA-C  promethazine-dextromethorphan (PROMETHAZINE-DM) 6.25-15 MG/5ML syrup Take 5 mLs by mouth 4 (four) times daily as needed for cough. 04/03/23  Yes Particia Nearing, PA-C  albuterol (VENTOLIN HFA) 108 (90 Base) MCG/ACT inhaler Inhale 1-2 puffs into the lungs every 6 (six) hours as needed for wheezing or shortness of breath. 06/08/22   Wallis Bamberg, PA-C  pantoprazole (PROTONIX) 40 MG tablet TAKE 1 TABLET(40 MG) BY MOUTH DAILY 01/14/23   Lula Olszewski, MD  pramipexole (MIRAPEX) 0.25 MG tablet TAKE 1 TABLET(0.25 MG) BY MOUTH THREE TIMES DAILY 01/30/23   Stasia Cavalier, MD  propranolol (INDERAL) 10 MG tablet TAKE 1 TABLET BY MOUTH TWICE DAILY AS NEEDED FOR ANXIETY(TAKE 4 TO 6 HOURS AFTER THE 20MG  DOSE AT THE EARLIEST) 01/30/23   Stasia Cavalier, MD  propranolol (INDERAL) 20 MG tablet TAKE 1 TABLET(20 MG) BY MOUTH DAILY 07/20/22   Lula Olszewski, MD     Family History Family History  Problem Relation Age of Onset   Hypertension Mother    Diabetes Mother    Stroke Mother    Depression Mother    Depression Sister    Irritable bowel syndrome Sister    Asthma Brother    Allergic rhinitis Brother    Migraines Brother    Depression Brother    Stroke Maternal Grandmother    Diabetes Maternal Grandmother    Alcohol abuse Maternal Grandfather    Stroke Paternal Grandmother    Breast cancer Paternal Grandmother    Alcohol abuse Paternal Grandfather    Lupus Half-Sister    Angioedema Neg Hx    Eczema Neg Hx    Immunodeficiency Neg Hx    Atopy Neg Hx     Social History Social History   Tobacco Use   Smoking status: Former    Current packs/day: 0.00    Types: Cigarettes    Quit date: 09/07/2006    Years since quitting: 16.5   Smokeless tobacco: Never  Vaping Use   Vaping status: Never Used  Substance Use Topics   Alcohol use: Yes    Comment: occ   Drug use: Not Currently     Allergies   Multiple vitamins-iron and Prednisone   Review of Systems Review of Systems Per HPI  Physical Exam Triage Vital Signs ED Triage Vitals  Encounter Vitals Group     BP 04/03/23 1130 120/82     Systolic BP Percentile --      Diastolic BP Percentile --      Pulse Rate 04/03/23 1130 68     Resp 04/03/23 1130 20     Temp 04/03/23 1130 98.4 F (36.9 C)     Temp Source 04/03/23 1130 Oral     SpO2 04/03/23 1130 98 %     Weight --      Height --      Head Circumference --      Peak Flow --      Pain Score 04/03/23 1128 2     Pain Loc --      Pain Education --      Exclude from Growth Chart --    No data found.  Updated Vital Signs BP 120/82 (BP Location: Right Arm)   Pulse 68   Temp 98.4 F (36.9 C) (Oral)   Resp 20   LMP 03/04/2023 (Approximate) Comment: requests preg test  SpO2 98%   Visual Acuity Right Eye Distance:   Left Eye Distance:   Bilateral Distance:    Right Eye Near:  Left Eye Near:    Bilateral  Near:     Physical Exam Vitals and nursing note reviewed.  Constitutional:      Appearance: Normal appearance.  HENT:     Head: Atraumatic.     Right Ear: Tympanic membrane and external ear normal.     Left Ear: Tympanic membrane and external ear normal.     Nose: Rhinorrhea present.     Mouth/Throat:     Mouth: Mucous membranes are moist.     Pharynx: Posterior oropharyngeal erythema present.  Eyes:     Extraocular Movements: Extraocular movements intact.     Conjunctiva/sclera: Conjunctivae normal.  Cardiovascular:     Rate and Rhythm: Normal rate and regular rhythm.     Heart sounds: Normal heart sounds.  Pulmonary:     Effort: Pulmonary effort is normal.     Breath sounds: Normal breath sounds. No wheezing or rales.  Abdominal:     General: Bowel sounds are normal. There is no distension.     Palpations: Abdomen is soft.     Tenderness: There is no abdominal tenderness. There is no right CVA tenderness, left CVA tenderness or guarding.  Musculoskeletal:        General: Normal range of motion.     Cervical back: Normal range of motion and neck supple.  Skin:    General: Skin is warm and dry.  Neurological:     Mental Status: She is alert and oriented to person, place, and time.  Psychiatric:        Mood and Affect: Mood normal.        Thought Content: Thought content normal.      UC Treatments / Results  Labs (all labs ordered are listed, but only abnormal results are displayed) Labs Reviewed  POCT URINE PREGNANCY    EKG   Radiology No results found.  Procedures Procedures (including critical care time)  Medications Ordered in UC Medications - No data to display  Initial Impression / Assessment and Plan / UC Course  I have reviewed the triage vital signs and the nursing notes.  Pertinent labs & imaging results that were available during my care of the patient were reviewed by me and considered in my medical decision making (see chart for details).      Vitals and exam overall reassuring today, consistent with viral respiratory infection.  Home COVID test was positive, good candidate for Paxlovid so we will treat with this, Phenergan DM, Flonase and supportive home care.  Her pregnancy test was negative, patient aware.  Final Clinical Impressions(s) / UC Diagnoses   Final diagnoses:  COVID-19  Negative pregnancy test     Discharge Instructions      In addition to the prescribed medications, you may take over-the-counter medications such as DayQuil, NyQuil, Mucinex, ibuprofen and Tylenol.    ED Prescriptions     Medication Sig Dispense Auth. Provider   nirmatrelvir & ritonavir (PAXLOVID, 300/100,) 20 x 150 MG & 10 x 100MG  TBPK Take 3 tablets by mouth 2 (two) times daily for 5 days. 30 tablet Particia Nearing, [place]   promethazine-dextromethorphan (PROMETHAZINE-DM) 6.25-15 MG/5ML syrup Take 5 mLs by mouth 4 (four) times daily as needed for cough. 118 mL Particia Nearing, PA-C   fluticasone [facility]) 50 MCG/ACT nasal spray Place 1 spray into both nostrils 2 (two) times daily. 16 g Particia Nearing, [place]      PDMP not reviewed this encounter.   Particia Nearing, [place] 04/03/23  1215  

## 2023-04-03 NOTE — Discharge Instructions (Signed)
In addition to the prescribed medications, you may take over-the-counter medications such as DayQuil, NyQuil, Mucinex, ibuprofen and Tylenol.

## 2023-04-03 NOTE — ED Triage Notes (Addendum)
Pt c/o nasal congestion, prod cough, sore throat, right ear fluid x 4 days with +covid home test-NAD-steady gait-pt requests preg test due to she is trying to get pregnant

## 2023-04-20 ENCOUNTER — Encounter (HOSPITAL_COMMUNITY): Payer: Self-pay | Admitting: Clinical

## 2023-04-20 ENCOUNTER — Ambulatory Visit (INDEPENDENT_AMBULATORY_CARE_PROVIDER_SITE_OTHER): Payer: Medicare Other | Admitting: Clinical

## 2023-04-20 DIAGNOSIS — F3181 Bipolar II disorder: Secondary | ICD-10-CM

## 2023-04-20 DIAGNOSIS — F603 Borderline personality disorder: Secondary | ICD-10-CM | POA: Diagnosis not present

## 2023-04-20 DIAGNOSIS — F411 Generalized anxiety disorder: Secondary | ICD-10-CM

## 2023-04-20 NOTE — Progress Notes (Addendum)
THERAPIST PROGRESS NOTE  Session Time: 8:04-9:01am  Session #13  Participation Level: Active  Behavioral Response: Casual, Alert, Euthymic  Type of Therapy: Individual Therapy  Treatment Goals addressed:  New Treatment Plan Established:  Problem: Anxiety Goal: LTG: Kimberly Harper will score less than 5 on the Generalized Anxiety Disorder 7 Scale (GAD-7) Goal: STG: Report feeling more positive about self and abilities during therapy sessions Goal: STG: Kimberly Harper will practice problem solving skills 3 times per week for the next 4 weeks. Goal: STG: Kimberly Harper will reduce frequency of avoidant behaviors by 50% as evidenced by self-report in therapy sessions Intervention: Work with patient individually to identify the major components of a recent episode of anxiety: physical symptoms, major thoughts and images, and major behaviors they experienced Intervention: Perform motivational interviewing regarding use of coping tools Intervention: Perform psychoeducation regarding anxiety disorders Intervention: Perform motivational interviewing regarding use of tools Intervention: Perform motivational interviewing regarding medication adherence Intervention: Encourage Kimberly Harper to take psychotropic medication(s) as prescribed   Problem: OP Depression Goal: LTG: Reduce frequency, intensity, and duration of depression symptoms so that daily functioning is improved Goal: STG: Get 7-8 hours of restful sleep every night Goal: STG: Kimberly Harper will identify cognitive patterns and beliefs that support depression Intervention: Therapist will educate patient on cognitive distortions and the rationale for treatment of depression Intervention: Kimberly Harper will identify 3-5 cognitive distortions they are currently using and write reframing statements to replace them Intervention: Kimberly Harper will identify 5-7 cognitive distortions they are currently using and write reframing statements to replace them   Problem: Personality  Disorder Goal: LTG: Kimberly Harper will reduce frequency of impulsive behaviors as evidenced by self-report of cutting behaviors and relationship sabotage Goal: STG: Kimberly Harper will identify at least 5 personal goals for emotion regulation Goal: STG: Kimberly Harper will formulate a safety plan including warning signs, coping skills, and supports Goal: LTG: Kimberly Harper will decrease the frequency of unwanted emotions as evidenced by self-report   Goal: STG: Reduce impulsive behavior by 50% as evidenced by self-report in individual session Goal: LTG:  Kimberly Harper will improve her communication with husband, son, and other family members AEB self-report of improved relationships Goal: STG: Kimberly Harper will develop alternative coping skills to fighting verbally, cutting herself, going off her medicine, and drinking Intervention: Work with Kimberly Harper to generate plan with at least 7 problem solving strategies to use instead of acting on impulsive urges Dates: Start:  08/17/22   Intervention: Patient will make list of 7 positive qualities and traits about self and review in session with therapist Intervention: Work with Kimberly Harper to identify 3 grounding techniques to practice for homework Intervention: Work with Kimberly Harper to identify at least 3-5 concrete goals for emotion regulation Intervention: Provide Kimberly Harper with education information and reading material on emotions and how to describe them Intervention: Work with Kimberly Harper to develop a crisis plan, including steps to take when the patient is unable to care for themselves Intervention: Work with Kimberly Harper to develop a behavioral chain analysis to help identify causes to ineffective behaviors Intervention: Work with Kimberly Harper to generate plan with at least 3-5 problem solving strategies to use instead of acting on impulsive urges Intervention: Teach and encourage Kimberly Harper to practice problem solving skills 2 times per week for the next 26 weeks Intervention: Work with Kimberly Harper  to Probation officer in session on a weekly basis for the next 26 weeks  ProgressTowards Goals: Progressing  Interventions: Supportive and Other: parenting    Summary: Kimberly Harper is a 37 y.o. female who presents for therapy  for her Borderline Personality Disorder, Generalized Anxiety Disorder, and Bipolar 2 (current episode depressive). She stated that since seeing the psychiatrist she is back on her medicines and feels better.  She realizes she is unstable mentally when she does not take the medication.  On top of that, alcohol consumption does not improve her mood in any way.  We talked about how her recent openness with her husband about her mental health has been helpful and will allow him to run interference for her when friends try to insist on her drinking.  She did admit to self-harming last week by cutting at the area of her left collarbone.  It is almost healed now.  She reported that she did this because after she and husband had a fight about him allowing two specific women on his Facebook page, she went to her apartment to stay and he did not check up on her for a couple of days, which left her feeling that he did not care about her wellbeing.  Cutting herself gave her "the ability to breathe" and also allowed her mind to clear so she could sleep all night.    We processed the ongoing situation with her husband's inconsistent parenting of his 4yo daughter and how that is affecting the patient.  For instance, because he will allow her to sleep with them, the adults actually do not have the opportunity to sleep due to the little girl's kicking.  This leaves patient exhausted and short-tempered.  CSW informed her about intermittent reinforcement (which is what he is doing), the [employer] classes, and how to ask him "Is what you are doing working?"  Patient shared that mother is now staying with her sister indefinitely, since her lease on her own home is ending in  November 2024.  She talked a little about parenting her 19yo son and trying to get him to step up and take care of himself rather than relying on her to do it.  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  It is very busy and sometimes overwhelming, but she appears to be calmer about it.  Throughout the session we talked about various cognitive distortions (at her instigation) that she has been noticing and working to replace.  Still need to review DEAR MAN, GIVE, IMPROVE, and ACCEPT - DBT skills.    Suicidal/Homicidal: No without intent/plan  Therapist Response: Patient is progressing AEB engaging in scheduled therapy session.  She presented oriented x5 and stated she was feeling "better."  CSW evaluated patient's medication compliance and self-care since last session.  She reported that since seeing the doctor she has remained on her medicine and that she knows without the medication she becomes unstable.  She has even told her husband that without her medicine and with alcohol she becomes angry and unhappy.  She committed to continuing to talk with her husband more about her mental health issues.  CSW encouraged patient to schedule more therapy sessions for the future, as there are only 2 remaining in place right now.  Throughout the session, CSW gave patient the opportunity to explore thoughts and feelings associated with current life situations and past/present external stressors.   CSW encouraged patient's expression of feelings and validated patient's thoughts using empathy, active listening, open body language, and unconditional positive regard.      Plan: Return again in 1 week Next appointment:  10/3  Recommendations:  Return to therapy  in 1 week, engage in self care behaviors especially with sleep and with eating, refrain from additional self-harm, consider helping husband look at [employer] classes, review worksheets previously  provided so we can work on them together  Diagnosis:  Encounter Diagnoses  Name Primary?   Borderline personality disorder (HCC) Yes   Generalized anxiety disorder    Bipolar 2 disorder, major depressive episode (HCC)    Collaboration of Care: Psychiatrist AEB    - psychiatrist can read therapy notes; therapist can and does read psychiatric notes prior to sessions  Patient/Guardian was advised Release of Information must be obtained prior to any record release in order to collaborate their care with an outside provider. Patient/Guardian was advised if they have not already done so to contact the registration department to sign all necessary forms in order for [place] to release information regarding their care.   Consent: Patient/Guardian gives verbal consent for treatment and assignment of benefits for services provided during this visit. Patient/Guardian expressed understanding and agreed to proceed.    Lynnell Chad, LCSW 04/20/2023

## 2023-04-26 ENCOUNTER — Ambulatory Visit (INDEPENDENT_AMBULATORY_CARE_PROVIDER_SITE_OTHER): Payer: Medicare Other | Admitting: Clinical

## 2023-04-26 DIAGNOSIS — F411 Generalized anxiety disorder: Secondary | ICD-10-CM | POA: Diagnosis not present

## 2023-04-26 DIAGNOSIS — F603 Borderline personality disorder: Secondary | ICD-10-CM

## 2023-04-26 DIAGNOSIS — F3181 Bipolar II disorder: Secondary | ICD-10-CM | POA: Diagnosis not present

## 2023-04-27 ENCOUNTER — Encounter (HOSPITAL_COMMUNITY): Payer: Self-pay | Admitting: Clinical

## 2023-04-27 NOTE — Progress Notes (Unsigned)
THERAPIST PROGRESS NOTE  Session Time: 4:00pm-5:00pm  Session #14  Participation Level: Active  Behavioral Response: Casual, Alert, Euthymic  Type of Therapy: Individual Therapy  Treatment Goals addressed:  *** Goal: LTG: Fidelis will score less than 5 on the Generalized Anxiety Disorder 7 Scale (GAD-7) Goal: STG: Report feeling more positive about self and abilities during therapy sessions Goal: STG: Starlene will practice problem solving skills 3 times per week for the next 4 weeks. Goal: STG: Iver will reduce frequency of avoidant behaviors by 50% as evidenced by self-report in therapy sessions Goal: LTG: Reduce frequency, intensity, and duration of depression symptoms so that daily functioning is improved Goal: STG: Get 7-8 hours of restful sleep every night Goal: STG: Graciemae will identify cognitive patterns and beliefs that support depression Goal: LTG: Arionna will reduce frequency of impulsive behaviors as evidenced by self-report of cutting behaviors and relationship sabotage Goal: STG: Lyrika will identify at least 5 personal goals for emotion regulation Goal: STG: Deyona will formulate a safety plan including warning signs, coping skills, and supports Goal: LTG: Elke will decrease the frequency of unwanted emotions as evidenced by self-report   Goal: STG: Reduce impulsive behavior by 50% as evidenced by self-report in individual session Goal: LTG:  Malon will improve her communication with husband, son, and other family members AEB self-report of improved relationships Goal: STG: Daila will develop alternative coping skills to fighting verbally, cutting herself, going off her medicine, and drinking  ProgressTowards Goals: Progressing  Interventions: Supportive and Other: parenting  ***  Summary: Kimberly Harper is a 37 y.o. female who presents for therapy for her Borderline Personality Disorder, Generalized Anxiety Disorder, and Bipolar 2 (current  episode depressive). ***  Still need to review DEAR MAN, GIVE, IMPROVE, and ACCEPT - DBT skills.    Suicidal/Homicidal: No without intent/plan  Therapist Response:  Patient is progressing AEB engaging in scheduled therapy session.  ***e presented oriented x5 and stated ***he was feeling "***."  CSW evaluated patient's medication compliance and self-care since last session.  CSW reviewed the last session with patient who reported ***.  Patient stated ***.  CSW taught CBT-related coping skills, specifically *** and ***.  Patient received these willingly and could discuss them in a way to indicate good comprehension.  CSW assigned patient the task of trying out these new coping skills (***) prior to next session on ***.  CSW encouraged patient to schedule more therapy sessions for the future, as there is only one more scheduled at this time.  Throughout the session, CSW gave patient the opportunity to explore thoughts and feelings associated with current life situations and past/present external stressors.   CSW encouraged patient's expression of feelings and validated patient's thoughts using empathy, active listening, open body language, and unconditional positive regard.      She reported that since seeing the doctor she has remained on her medicine and that she knows without the medication she becomes unstable.  She has even told her husband that without her medicine and with alcohol she becomes angry and unhappy.     Plan: Return again in 2 weeks Next appointment:  10/17  Recommendations:  Return to therapy in 1 week, engage in self care behaviors especially with sleep and with eating, review worksheets previously provided so we can work on them together, refrain from self-harm  Diagnosis:  Encounter Diagnoses  Name Primary?   Borderline personality disorder (HCC) Yes   Generalized anxiety disorder    Bipolar 2 disorder, major depressive  episode [facility])     Collaboration of Care:  Psychiatrist AEB    - psychiatrist can read therapy notes; therapist can and does read psychiatric notes prior to sessions  Patient/Guardian was advised Release of Information must be obtained prior to any record release in order to collaborate their care with an outside provider. Patient/Guardian was advised if they have not already done so to contact the registration department to sign all necessary forms in order for [place] to release information regarding their care.   Consent: Patient/Guardian gives verbal consent for treatment and assignment of benefits for services provided during this visit. Patient/Guardian expressed understanding and agreed to proceed.    Lynnell Chad, LCSW 04/27/2023

## 2023-05-06 ENCOUNTER — Other Ambulatory Visit (HOSPITAL_COMMUNITY): Payer: Self-pay | Admitting: Psychiatry

## 2023-05-06 DIAGNOSIS — F411 Generalized anxiety disorder: Secondary | ICD-10-CM

## 2023-05-10 ENCOUNTER — Ambulatory Visit (HOSPITAL_COMMUNITY): Payer: Medicare Other | Admitting: Clinical

## 2023-05-15 ENCOUNTER — Other Ambulatory Visit (HOSPITAL_COMMUNITY): Payer: Self-pay | Admitting: Psychiatry

## 2023-05-15 DIAGNOSIS — F411 Generalized anxiety disorder: Secondary | ICD-10-CM

## 2023-07-02 ENCOUNTER — Other Ambulatory Visit: Payer: Self-pay | Admitting: Family Medicine

## 2023-07-05 ENCOUNTER — Ambulatory Visit (HOSPITAL_COMMUNITY): Payer: Medicare Other | Admitting: Clinical

## 2023-07-05 ENCOUNTER — Encounter (HOSPITAL_COMMUNITY): Payer: Self-pay | Admitting: Clinical

## 2023-07-05 DIAGNOSIS — F411 Generalized anxiety disorder: Secondary | ICD-10-CM | POA: Diagnosis not present

## 2023-07-05 DIAGNOSIS — F3181 Bipolar II disorder: Secondary | ICD-10-CM

## 2023-07-05 DIAGNOSIS — F603 Borderline personality disorder: Secondary | ICD-10-CM | POA: Diagnosis not present

## 2023-07-05 NOTE — Progress Notes (Signed)
THERAPIST PROGRESS NOTE  Session Time: 2:05pm-2:56pm  Session #15  Virtual Visit via Video Note  I connected with Kimberly Harper on 07/05/23 at  2:00 PM EST by a video enabled telemedicine application and verified that I am speaking with the correct person using two identifiers.  Location: Patient: car in parking lot Provider: [facility] outpatient therapy office   I discussed the limitations of evaluation and management by telemedicine and the availability of in person appointments. The patient expressed understanding and agreed to proceed.   I discussed the assessment and treatment plan with the patient. The patient was provided an opportunity to ask questions and all were answered. The patient agreed with the plan and demonstrated an understanding of the instructions.   The patient was advised to call back or seek an in-person evaluation if the symptoms worsen or if the condition fails to improve as anticipated.  I provided 51 minutes of non-face-to-face time during this encounter.   Lynnell Chad, LCSW   Participation Level: Active  Behavioral Response: Casual, Alert, Euthymic and Irritable  Type of Therapy: Individual Therapy  Treatment Goals addressed:   Goal: LTG: Kimberly Harper will score less than 5 on the Generalized Anxiety Disorder 7 Scale (GAD-7) Goal: STG: Report feeling more positive about self and abilities during therapy sessions Goal: STG: Kimberly Harper will practice problem solving skills 3 times per week for the next 4 weeks. Goal: STG: Kimberly Harper will reduce frequency of avoidant behaviors by 50% as evidenced by self-report in therapy sessions Goal: LTG: Reduce frequency, intensity, and duration of depression symptoms so that daily functioning is improved Goal: STG: Get 7-8 hours of restful sleep every night Goal: STG: Kimberly Harper will identify cognitive patterns and beliefs that support depression Goal: LTG: Kimberly Harper will reduce frequency of impulsive  behaviors as evidenced by self-report of cutting behaviors and relationship sabotage Goal: STG: Regis will identify at least 5 personal goals for emotion regulation Goal: STG: Kimberly Harper will formulate a safety plan including warning signs, coping skills, and supports Goal: LTG: Kimberly Harper will decrease the frequency of unwanted emotions as evidenced by self-report   Goal: STG: Reduce impulsive behavior by 50% as evidenced by self-report in individual session Goal: LTG:  Kimberly Harper will improve her communication with husband, son, and other family members AEB self-report of improved relationships Goal: STG: Kimberly Harper will develop alternative coping skills to fighting verbally, cutting herself, going off her medicine, and drinking  ProgressTowards Goals: Progressing  Interventions: Assertiveness Training and Supportive   Summary: Kimberly Harper is a 37 y.o. female who presents for therapy for her Borderline Personality Disorder, Generalized Anxiety Disorder, and Bipolar 2 (current episode depressive). She presented oriented x5 and stated she was feeling "all over the place, moody."  CSW evaluated patient's medication compliance, use of coping tools, and self-care, as applicable.   She did admit, when questioned, to self-harming since the last session but not in the last month.  She is not eating regularly but this is because of work and inconvenience, not because she is trying to starve herself.  She has only had alcohol a few times, will skip the medicine when she drinks, but has found that she becomes sick so is doing this much less often.  We talked at length about the upcoming move in 2 days out of her apartment in [place].  While she has not been staying there, it feels unsettling to be giving up a space she can go to to get away.  Her son has not moved  his possessions out and she does not think he believes that he will be unable to stay there any longer and will have nowhere to go.  She  worries about him, thinks he is in a deep depression and does not know how to help him.  CSW encouraged her to tell him about [facility] and provide him with the information from [facility], then to ask him using "I" statements to set up appointments.  We processed what she can control in that situation.  Her mother who is staying with sister has now outstayed her welcome there, as similar issues are happening with incontinence and refusal to ask for help.  As a result, the house smells like urine.  On Thanksgiving, patient had to go over and clean up feces from everywhere, had to force mother to take a shower because although she was covered in feces, she wanted to go lie down on her bed.  Patient continues to take all the steps to get mother and son into their own place because they will not do it for themselves; CSW reminded her that they will never learn to do these things for themselves if she always steps in and intervenes.  She is worried they will call her all the time for help so CSW suggested that she start thinking about appropriate boundaries to put in place at that time.  We discussed her relationship with husband which is not going great.  A woman from his past continues to text him and he has refused to block the woman until patient became quite angry, then he finally agreed.  She suspects he did not follow through, however.  Patient also does not like the way he gives in to his daughter continually, per patient's perspective.  For instance, when patient was tired and did not want to go to a movie untilt he next day, because his daughter cried he went ahead and took her without patient and also took her to see the movie patient wanted to see instead of a different more child-friendly movie.  CSW once again reminded patient about using "I" statements to talk to him about her feelings because he cannot and likely will not hear her concerns if she states them as complaints about him or his daughter.  Her job  was also discussed and she revealed that she feels bad for some people that are not being helped sufficiently because the team does not have the expertise to do so.  CSW suggested to her to look at what she controls and does not in the situation.  She does not control the program policies but she does control how much knowledge she herself possesses, so she could take classes or learn more.  All of these issues were processed with attention to her feelings and to any potential cognitive distortions.  Suicidal/Homicidal: No without intent/plan  Therapist Response:  Patient is progressing AEB engaging in scheduled therapy session.  Throughout the session, CSW gave patient the opportunity to explore thoughts and feelings associated with current life situations and past/present stressors.   CSW challenged patient gently and appropriately to consider different ways of looking at reported issues. CSW encouraged patient's expression of feelings and validated these using empathy, active listening, open body language, and unconditional positive regard.   CSW encouraged patient to schedule more therapy sessions for the future, as needed.   Plan: Return again at first available appointment on 1/31  Recommendations:  Return to therapy 1/31, consider group therapy, engage in  self care behaviors, refrain from self-harm  Diagnosis:  Encounter Diagnoses  Name Primary?   Borderline personality disorder (HCC) Yes   Generalized anxiety disorder    Bipolar 2 disorder, major depressive episode (HCC)    Collaboration of Care: Psychiatrist AEB    - psychiatrist can read therapy notes; therapist can and does read psychiatric notes prior to sessions  Patient/Guardian was advised Release of Information must be obtained prior to any record release in order to collaborate their care with an outside provider. Patient/Guardian was advised if they have not already done so to contact the registration department to sign all  necessary forms in order for [place] to release information regarding their care.   Consent: Patient/Guardian gives verbal consent for treatment and assignment of benefits for services provided during this visit. Patient/Guardian expressed understanding and agreed to proceed.    Lynnell Chad, LCSW 07/05/2023

## 2023-07-08 ENCOUNTER — Ambulatory Visit
Admission: EM | Admit: 2023-07-08 | Discharge: 2023-07-08 | Disposition: A | Discharge status: Home / Self Care | Payer: Medicare Other | Attending: Internal Medicine | Admitting: Internal Medicine

## 2023-07-08 ENCOUNTER — Other Ambulatory Visit (HOSPITAL_COMMUNITY): Payer: Self-pay | Admitting: Psychiatry

## 2023-07-08 DIAGNOSIS — N3000 Acute cystitis without hematuria: Secondary | ICD-10-CM | POA: Insufficient documentation

## 2023-07-08 DIAGNOSIS — F411 Generalized anxiety disorder: Secondary | ICD-10-CM

## 2023-07-08 DIAGNOSIS — N898 Other specified noninflammatory disorders of vagina: Secondary | ICD-10-CM | POA: Diagnosis present

## 2023-07-08 LAB — POCT URINALYSIS DIP (MANUAL ENTRY)
Bilirubin, UA: NEGATIVE
Blood, UA: NEGATIVE
Glucose, UA: NEGATIVE mg/dL
Nitrite, UA: NEGATIVE
Protein Ur, POC: NEGATIVE mg/dL
Spec Grav, UA: 1.02 (ref 1.010–1.025)
Urobilinogen, UA: 0.2 U/dL
pH, UA: 7 (ref 5.0–8.0)

## 2023-07-08 LAB — POCT URINE PREGNANCY: Preg Test, Ur: NEGATIVE

## 2023-07-08 MED ORDER — CEPHALEXIN 500 MG PO CAPS
500.0000 mg | ORAL_CAPSULE | Freq: Two times a day (BID) | ORAL | 0 refills | Status: AC
Start: 2023-07-08 — End: 2023-07-15

## 2023-07-08 NOTE — ED Provider Notes (Signed)
[facility]    CSN: [id number] Arrival date & time: 07/08/23  1215      History   Chief Complaint Chief Complaint  Patient presents with   Abdominal Pain    Abdominal pain and discolored urine. X2 weeks   Vaginal Discharge    Vaginal discharge x2 days    HPI Kimberly Harper is a 37 y.o. female presents for dysuria and vaginal discharge.  Patient reports 2 weeks of malodorous urine that is dark in color as well as suprapubic pressure.  She states she does hold her urine often when she is at work.  Denies any burning, urgency, frequency, hematuria, fevers, nausea/vomiting, flank pain.  She also had 2 days of a thicker discolored vaginal discharge that is not malodorous or pruritic.  States it is not consistent with either BV or yeast which she has had in the past.  No known STD exposure but she would like screening.  No OTC medications have been used since onset.  No other concerns at this time.   Abdominal Pain Associated symptoms: dysuria and vaginal discharge   Vaginal Discharge Associated symptoms: dysuria     Past Medical History:  Diagnosis Date   Alcoholic (HCC)    Anemia    Phreesia 01/17/2020   Anxiety    Asthma    Asthma 06/22/2021   Bipolar 1 disorder (HCC)    Blood transfusion without reported diagnosis    Phreesia 01/17/2020   Chest pain 03/01/2022   03/2021 xr negative.   Chronic headaches    Depression    Phreesia 01/17/2020   Lactose intolerance 03/01/2022   Major depressive disorder    Nausea 03/01/2022   Pregnancy, location unknown 11/21/2021   Formatting of this note might be different from the original. [facility] Quant Book Jeilyn Pellman Language: English There is no such number on file (mobile). [phone] (home) [phone] [place] [address] No e-mail address on record  Lab Hours [facility] ([address] floor [place], [address]): M-F 7:30-18:00 [facility]: M-F 8:00-17:30 [facility]: M-F 8:30-17:00 (cl    Substance abuse (HCC)    Phreesia 01/17/2020    Patient Active Problem List   Diagnosis Date Noted   Chest injuries 07/20/2022   Vaginal pain 06/13/2022   Abnormal uterine bleeding (AUB) 03/13/2022   Nausea 03/01/2022   Chest pain 03/01/2022   Lactose intolerance 03/01/2022   Rash 03/01/2022   Epigastric pain 02/08/2022   Other allergic rhinitis 06/22/2021   Heartburn 06/22/2021   Panic disorder 04/12/2020   GAD (generalized anxiety disorder) 03/18/2020   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   Borderline personality disorder (HCC) 02/06/2020   Bipolar 2 disorder, major depressive episode (HCC) 12/06/2019    Past Surgical History:  Procedure Laterality Date   CESAREAN SECTION N/A    Phreesia 01/17/2020   COLONOSCOPY     earlier in year 2022   DILATION AND CURETTAGE OF UTERUS     HAND SURGERY Right    WISDOM TOOTH EXTRACTION      OB History     Gravida  4   Para  1   Term      Preterm  1   AB  3   Living  1      SAB  3   IAB      Ectopic      Multiple      Live Births  1  Home Medications    Prior to Admission medications   Medication Sig Start Date End Date Taking? Authorizing Provider  albuterol (VENTOLIN HFA) 108 (90 Base) MCG/ACT inhaler Inhale 1-2 puffs into the lungs every 6 (six) hours as needed for wheezing or shortness of breath. 06/08/22  Yes Wallis Bamberg, PA-C  cephALEXin (KEFLEX) 500 MG capsule Take 1 capsule (500 mg total) by mouth 2 (two) times daily for 7 days. 07/08/23 07/15/23 Yes Radford Pax, NP  fluticasone (FLONASE) 50 MCG/ACT nasal spray Place 1 spray into both nostrils 2 (two) times daily. 04/03/23  Yes Particia Nearing, PA-C  pantoprazole (PROTONIX) 40 MG tablet TAKE 1 TABLET(40 MG) BY MOUTH DAILY 01/14/23  Yes Lula Olszewski, MD  pramipexole (MIRAPEX) 0.25 MG tablet TAKE 1 TABLET(0.25 MG) BY MOUTH THREE TIMES DAILY 05/15/23  Yes Stasia Cavalier, MD  promethazine-dextromethorphan (PROMETHAZINE-DM) 6.25-15  MG/5ML syrup Take 5 mLs by mouth 4 (four) times daily as needed for cough. 04/03/23  Yes Particia Nearing, PA-C  propranolol (INDERAL) 10 MG tablet TAKE 1 TABLET BY MOUTH TWICE DAILY AS NEEDED FOR ANXIETY 05/06/23  Yes Stasia Cavalier, MD  propranolol (INDERAL) 20 MG tablet TAKE 1 TABLET(20 MG) BY MOUTH DAILY 07/20/22  Yes Lula Olszewski, MD    Family History Family History  Problem Relation Age of Onset   Hypertension Mother    Diabetes Mother    Stroke Mother    Depression Mother    Depression Sister    Irritable bowel syndrome Sister    Asthma Brother    Allergic rhinitis Brother    Migraines Brother    Depression Brother    Stroke Maternal Grandmother    Diabetes Maternal Grandmother    Alcohol abuse Maternal Grandfather    Stroke Paternal Grandmother    Breast cancer Paternal Grandmother    Alcohol abuse Paternal Grandfather    Lupus Half-Sister    Angioedema Neg Hx    Eczema Neg Hx    Immunodeficiency Neg Hx    Atopy Neg Hx     Social History Social History   Tobacco Use   Smoking status: Former    Current packs/day: 0.00    Types: Cigarettes    Quit date: 09/07/2006    Years since quitting: 16.8   Smokeless tobacco: Never  Vaping Use   Vaping status: Never Used  Substance Use Topics   Alcohol use: Yes    Comment: occ   Drug use: Not Currently     Allergies   Multiple vitamins-iron and Prednisone   Review of Systems Review of Systems  Genitourinary:  Positive for dysuria and vaginal discharge.     Physical Exam Triage Vital Signs ED Triage Vitals  Encounter Vitals Group     BP 07/08/23 1416 127/82     Systolic BP Percentile --      Diastolic BP Percentile --      Pulse Rate 07/08/23 1416 71     Resp 07/08/23 1416 17     Temp --      Temp src --      SpO2 07/08/23 1416 99 %     Weight 07/08/23 1414 150 lb (68 kg)     Height 07/08/23 1414 5\' 5"  (1.651 m)     Head Circumference --      Peak Flow --      Pain Score 07/08/23 1414 3      Pain Loc --      Pain Education --  Exclude from Growth Chart --    No data found.  Updated Vital Signs BP 127/82 (BP Location: Right Arm)   Pulse 71   Resp 17   Ht 5\' 5"  (1.651 m)   Wt 150 lb (68 kg)   LMP 06/09/2023   SpO2 99%   BMI 24.96 kg/m   Visual Acuity Right Eye Distance:   Left Eye Distance:   Bilateral Distance:    Right Eye Near:   Left Eye Near:    Bilateral Near:     Physical Exam Vitals and nursing note reviewed.  Constitutional:      Appearance: Normal appearance.  HENT:     Head: Normocephalic and atraumatic.  Eyes:     Pupils: Pupils are equal, round, and reactive to light.  Cardiovascular:     Rate and Rhythm: Normal rate.  Pulmonary:     Effort: Pulmonary effort is normal.  Abdominal:     Tenderness: There is no right CVA tenderness or left CVA tenderness.  Skin:    General: Skin is warm and dry.  Neurological:     General: No focal deficit present.     Mental Status: She is alert and oriented to person, place, and time.  Psychiatric:        Mood and Affect: Mood normal.        Behavior: Behavior normal.      UC Treatments / Results  Labs (all labs ordered are listed, but only abnormal results are displayed) Labs Reviewed  POCT URINALYSIS DIP (MANUAL ENTRY) - Abnormal; Notable for the following components:      Result Value   Ketones, POC UA trace (5) (*)    Leukocytes, UA Small (1+) (*)    All other components within normal limits  URINE CULTURE  POCT URINE PREGNANCY  CERVICOVAGINAL ANCILLARY ONLY    EKG   Radiology No results found.  Procedures Procedures (including critical care time)  Medications Ordered in UC Medications - No data to display  Initial Impression / Assessment and Plan / UC Course  I have reviewed the triage vital signs and the nursing notes.  Pertinent labs & imaging results that were available during my care of the patient were reviewed by me and considered in my medical decision making  (see chart for details).     Reviewed exam and symptoms with patient.  No red flags.  UA positive for UTI, will culture and start Keflex.  Vaginal swab was ordered we will contact for any positive results.  Will await results prior to initiating treatment.  PCP follow-up if symptoms do not improve.  ER precautions reviewed. Final Clinical Impressions(s) / UC Diagnoses   Final diagnoses:  Acute cystitis without hematuria  Vaginal discharge   Discharge Instructions   None    ED Prescriptions     Medication Sig Dispense Auth. Provider   cephALEXin (KEFLEX) 500 MG capsule Take 1 capsule (500 mg total) by mouth 2 (two) times daily for 7 days. 14 capsule Radford Pax, NP      PDMP not reviewed this encounter.   Radford Pax, NP 07/08/23 1438

## 2023-07-08 NOTE — ED Triage Notes (Signed)
Pt states that she has some abdominal pain and discoloration of her urine. X2 weeks  Pt states that she also has some vaginal discharge. X2 days Pt denies any odor.

## 2023-07-08 NOTE — Discharge Instructions (Signed)
The clinic will contact you with results of the urine culture and STD testing done today if positive.  Start Keflex twice daily for 7 days.  Lots of rest and fluids.  Please follow-up with your PCP if your symptoms do not improve.  Please go to the ER for any worsening symptoms.  I hope you feel better soon!

## 2023-07-09 LAB — CERVICOVAGINAL ANCILLARY ONLY
Bacterial Vaginitis (gardnerella): POSITIVE — AB
Candida Glabrata: NEGATIVE
Candida Vaginitis: NEGATIVE
Chlamydia: NEGATIVE
Comment: NEGATIVE
Comment: NEGATIVE
Comment: NEGATIVE
Comment: NEGATIVE
Comment: NEGATIVE
Comment: NORMAL
Neisseria Gonorrhea: NEGATIVE
Trichomonas: NEGATIVE

## 2023-07-09 LAB — URINE CULTURE: Culture: NO GROWTH

## 2023-07-11 ENCOUNTER — Telehealth (HOSPITAL_COMMUNITY): Payer: Self-pay

## 2023-07-11 MED ORDER — FLUCONAZOLE 150 MG PO TABS: 150.0000 mg | ORAL_TABLET | Freq: Once | ORAL | 0 refills | Status: AC

## 2023-07-11 NOTE — Telephone Encounter (Signed)
Per protocol, pt requires tx with Diflucan.  Rx sent to pharmacy on file.

## 2023-07-30 ENCOUNTER — Ambulatory Visit (HOSPITAL_COMMUNITY): Payer: Medicare Other | Admitting: Clinical

## 2023-08-10 ENCOUNTER — Other Ambulatory Visit: Payer: Self-pay | Admitting: Internal Medicine

## 2023-08-10 DIAGNOSIS — F410 Panic disorder [episodic paroxysmal anxiety] without agoraphobia: Secondary | ICD-10-CM

## 2023-08-10 DIAGNOSIS — F411 Generalized anxiety disorder: Secondary | ICD-10-CM

## 2023-08-10 NOTE — Telephone Encounter (Signed)
Denied this medication, Last OV was in December of 2023. Sent my chart message informing patient to schedule a OV for this refill.

## 2023-08-18 IMAGING — DX DG CHEST 2V
2 series · 2 of 2 positions shown · non-contrast
Comparison: 03/15/2020

CLINICAL DATA: Cough

EXAM:
CHEST - 2 VIEW

[chest pa]
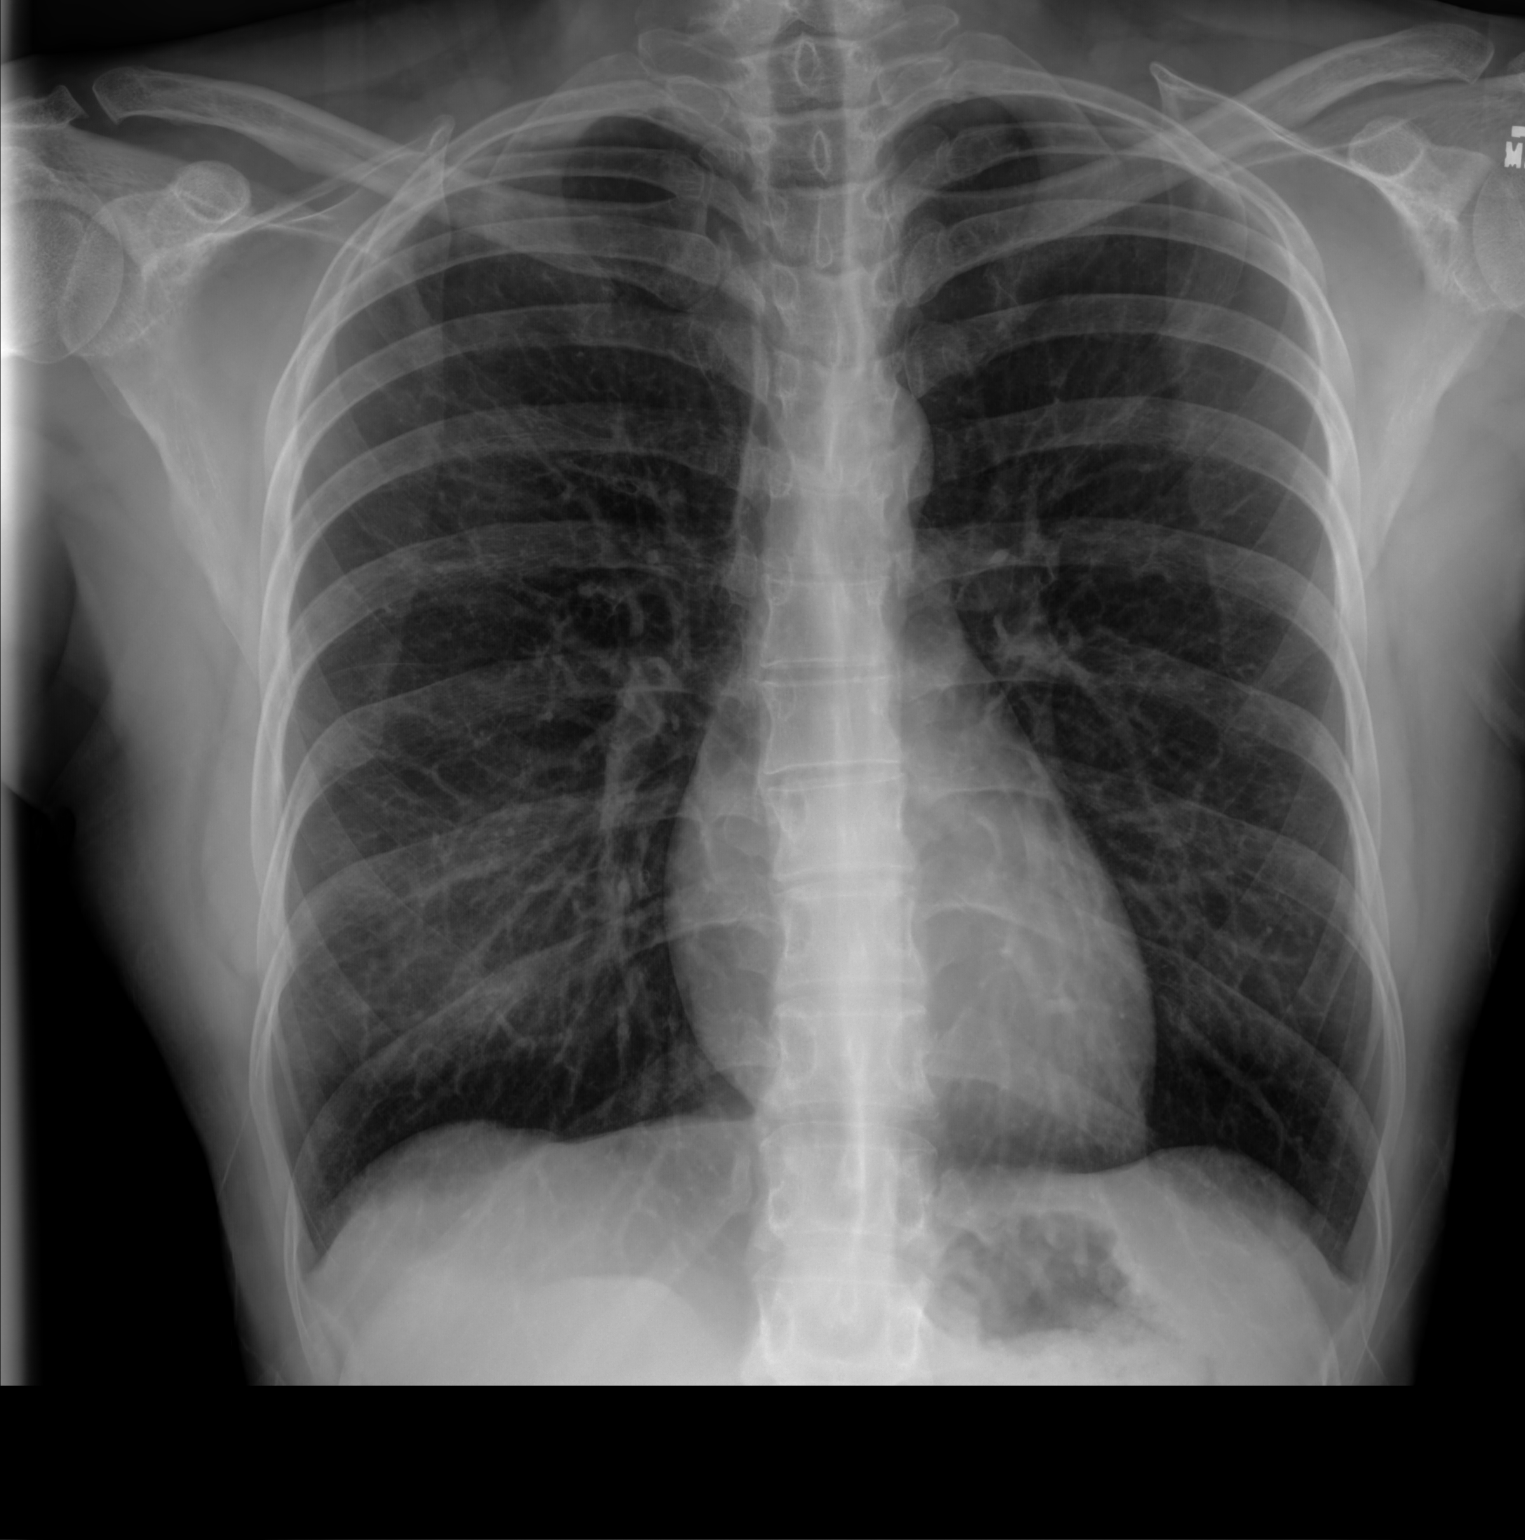

[chest lat]
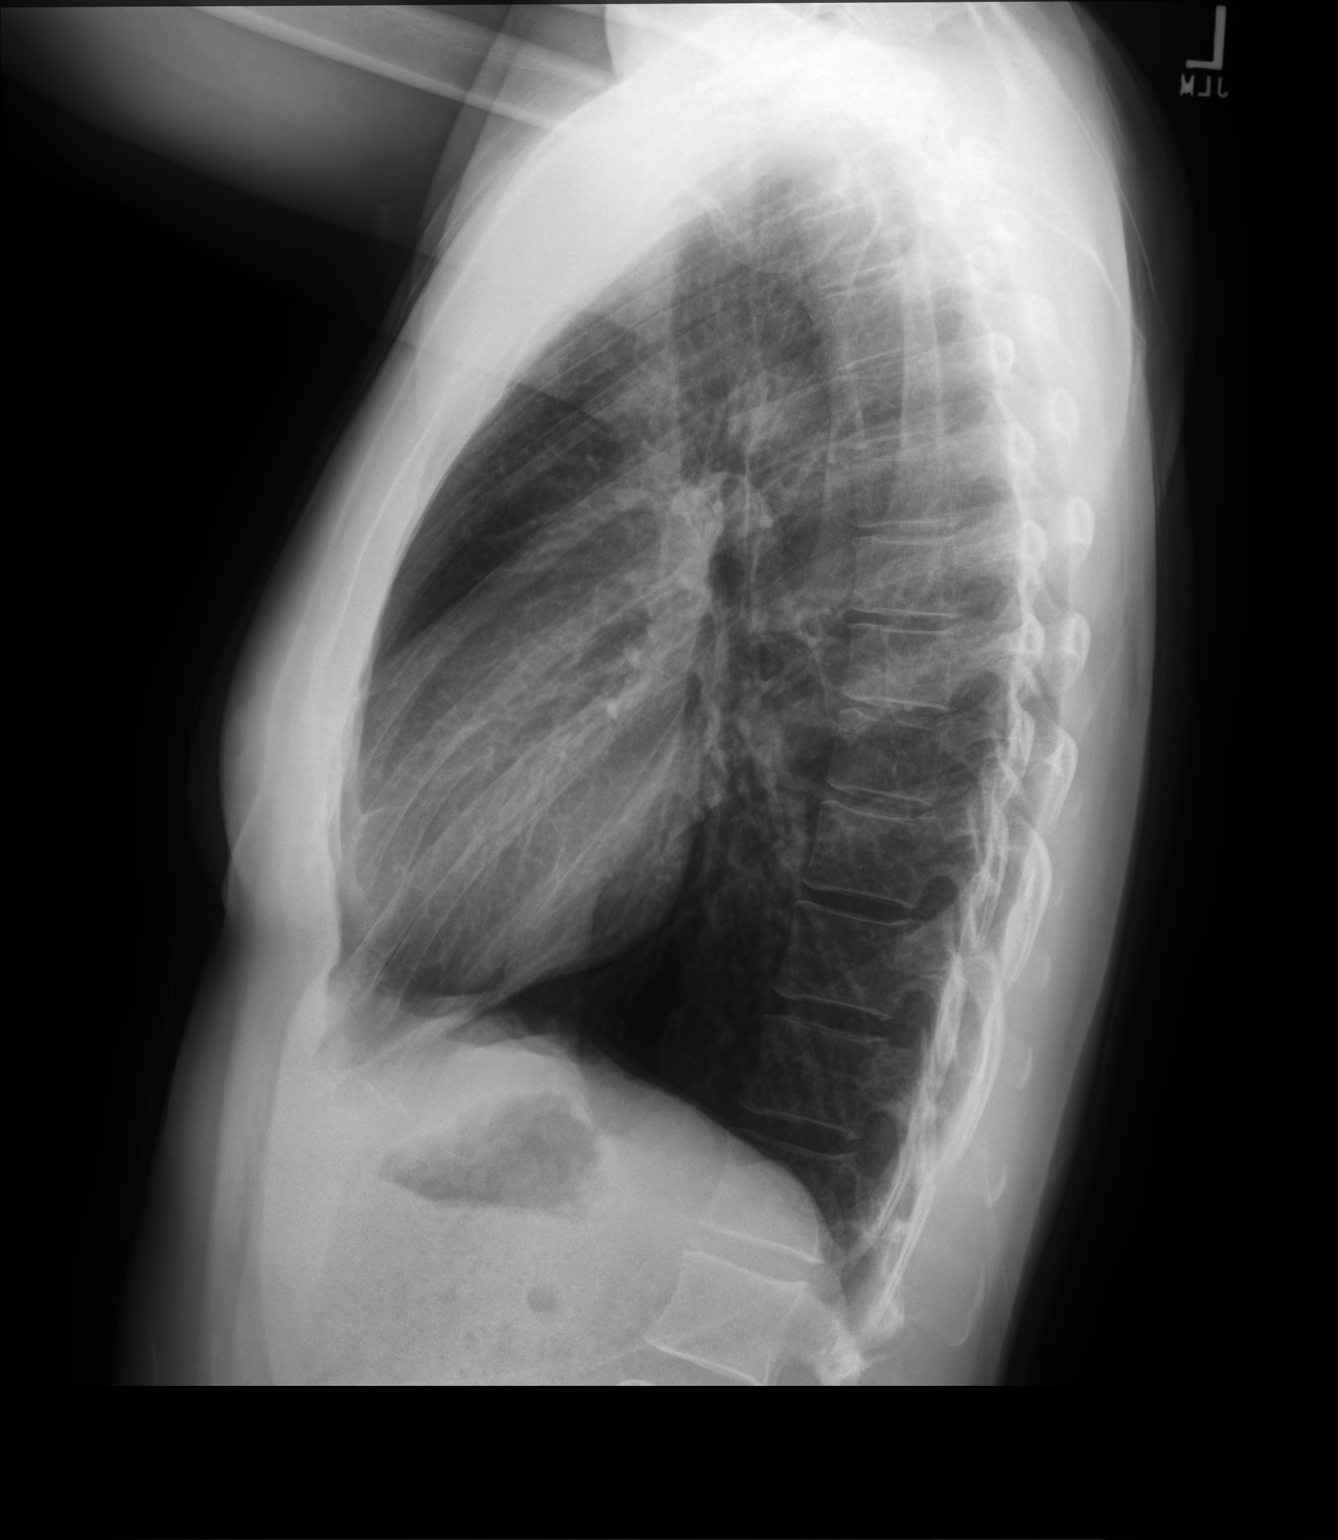

[2 of 2 positions shown; findings below may reference images not displayed]

FINDINGS: The heart size and mediastinal contours are within normal limits.
Both lungs are clear. The visualized skeletal structures are
unremarkable.
IMPRESSION: No active cardiopulmonary disease.

## 2023-08-24 ENCOUNTER — Encounter (HOSPITAL_COMMUNITY): Payer: Self-pay | Admitting: Clinical

## 2023-08-24 ENCOUNTER — Ambulatory Visit (INDEPENDENT_AMBULATORY_CARE_PROVIDER_SITE_OTHER): Payer: Medicare Other | Admitting: Clinical

## 2023-08-24 DIAGNOSIS — F411 Generalized anxiety disorder: Secondary | ICD-10-CM | POA: Diagnosis not present

## 2023-08-24 DIAGNOSIS — F603 Borderline personality disorder: Secondary | ICD-10-CM

## 2023-08-24 DIAGNOSIS — F3181 Bipolar II disorder: Secondary | ICD-10-CM

## 2023-08-24 NOTE — Progress Notes (Unsigned)
THERAPIST PROGRESS NOTE  Session Time: 10:00am-10:38am  Session #16  Virtual Visit via Video Note  I connected with Kimberly Harper on 08/24/23 at 10:00 AM EST by a video enabled telemedicine application and verified that I am speaking with the correct person using two identifiers.  Location: Patient: car in parking lot Provider: [facility] outpatient therapy office   I discussed the limitations of evaluation and management by telemedicine and the availability of in person appointments. The patient expressed understanding and agreed to proceed.   I discussed the assessment and treatment plan with the patient. The patient was provided an opportunity to ask questions and all were answered. The patient agreed with the plan and demonstrated an understanding of the instructions.   The patient was advised to call back or seek an in-person evaluation if the symptoms worsen or if the condition fails to improve as anticipated.  I provided 38 minutes of non-face-to-face time during this encounter.   Lynnell Chad, LCSW   Participation Level: Active  Behavioral Response: Casual, Alert, Depressed and Negative  Type of Therapy: Individual Therapy  Treatment Goals addressed:   Goal: LTG: Alashia will score less than 5 on the Generalized Anxiety Disorder 7 Scale (GAD-7) Goal: STG: Report feeling more positive about self and abilities during therapy sessions Goal: STG: Shaheen will practice problem solving skills 3 times per week for the next 4 weeks. Goal: STG: Greg will reduce frequency of avoidant behaviors by 50% as evidenced by self-report in therapy sessions Goal: LTG: Reduce frequency, intensity, and duration of depression symptoms so that daily functioning is improved Goal: STG: Get 7-8 hours of restful sleep every night Goal: STG: Anastasiya will identify cognitive patterns and beliefs that support depression Goal: LTG: Mirah will reduce frequency of impulsive  behaviors as evidenced by self-report of cutting behaviors and relationship sabotage Goal: STG: Tilley will identify at least 5 personal goals for emotion regulation Goal: STG: Danyale will formulate a safety plan including warning signs, coping skills, and supports Goal: LTG: Leidi will decrease the frequency of unwanted emotions as evidenced by self-report   Goal: STG: Reduce impulsive behavior by 50% as evidenced by self-report in individual session Goal: LTG:  Simranjit will improve her communication with husband, son, and other family members AEB self-report of improved relationships Goal: STG: [place] will develop alternative coping skills to fighting verbally, cutting herself, going off her medicine, and drinking  ProgressTowards Goals: Progressing  Interventions: Assertiveness Training and Supportive assessment  Summary: Kimberly Harper is a 38 y.o. female who presents for therapy for her Borderline Personality Disorder, Generalized Anxiety Disorder, and Bipolar 2 (current episode depressive). She presented oriented x5 and stated she was feeling "trying."  CSW evaluated patient's medication compliance, use of coping tools, and self-care, as applicable.   She kept her sunglasses on the whole time, saying she had a headache.  She has now moved all her stuff into her husband's place and storage.  She helped her mother and son get a place of their own and they are waiting for her to help them unpack.  CSW encouraged her to do this at her own pace as she feels able instead of feeling it is "my job."  She shared that work is going okay, although she wishes she could be more effective.  She is actively trying to figure out resources that she was not taught.  We looked at her medications and she confirmed she is not on any depression or mood stabilizing medicine.  CSW administered depression and anxiety questionnaires.  Based on her PHQ-9 and GAD-7 scores today and the way she has presented  for some time now in therapy, it may be the next step if she is willing to go on such medicine.  Her PHQ-9 score today is 15, up from 13 in January 2024, bu down from 20 in January 2023.  Her GAD-7 score today is 89, the same as January 2024.  She continues to have relationship issues with son who is depressed and worries her, lost weight to less than 100 pounds at one point.  She continues to have relationship issues with husband is does not know how to parent, but she is happy to report that he is becoming more open to hearing and learning.  She still has trust issues with him and they each continue to only be able to see things from their own perspective.  She is learning, however, to be less accusatory, which she feels is progress.  CSW provided her with positive strokes for this.  She was able to report that the discussion she had with her husband this week was likely the healthiest discussion they have ever had.  CSW explained the communication loop and how it is incomplete if the other person does not actual understand what we meant to convey.  CSW suggested that she ask her husband to repeat back to her what he thinks she said and to take the blame on herself, that she just wants to make sure she properly communicated.  She liked this idea and said she will try it.  Finally, she admitted only when directly questioned that she is continuing to self-harm, with the last time being 6 days ago on Saturday as a result of her husband not wearing his wedding band when they went out with friends.  Other husbands in the gathering were attentive to their wives and he was not, so she left early.  She also noticed a female being there whom she suspects of having a fling at one point with him before they got married.  He is not aware of her self-harm.  Suicidal/Homicidal: No without intent/plan  Therapist Response:  Patient is progressing AEB engaging in scheduled therapy session.  Throughout the session, CSW gave  patient the opportunity to explore thoughts and feelings associated with current life situations and past/present stressors.   CSW challenged patient gently and appropriately to consider different ways of looking at reported issues. CSW encouraged patient's expression of feelings and validated these using empathy, active listening, open body language, and unconditional positive regard.   An additional 5 appointments are already in place every 2 weeks.   Plan: Return again in 1 week on 2/5  Recommendations:  Return to therapy 2/5, refrain from self-harm by using any distress tolerance skills she can, consider the communication of reflecting back to her husband and asking him to reflect back ot her.  Diagnosis:  Encounter Diagnoses  Name Primary?   Borderline personality disorder (HCC) Yes   Generalized anxiety disorder    Bipolar 2 disorder, major depressive episode (HCC)     Collaboration of Care: Psychiatrist AEB    - psychiatrist can read therapy notes; therapist can and does read psychiatric notes prior to sessions  Patient/Guardian was advised Release of Information must be obtained prior to any record release in order to collaborate their care with an outside provider. Patient/Guardian was advised if they have not already done so to contact the registration department to  sign all necessary forms in order for [place] to release information regarding their care.   Consent: Patient/Guardian gives verbal consent for treatment and assignment of benefits for services provided during this visit. Patient/Guardian expressed understanding and agreed to proceed.    Lynnell Chad, LCSW 08/24/2023     08/24/2023   10:33 AM 08/17/2022    2:15 PM 03/01/2022    3:04 PM 08/01/2021   10:53 AM 04/12/2021    1:23 PM  Depression screen PHQ 2/9  Decreased Interest 1 1 0 2 1  Down, Depressed, Hopeless 3 1 0 2 0  PHQ - 2 Score 4 2 0 4 1  Altered sleeping 3 3  3 3   Tired, decreased energy 3 1  1  0   Change in appetite 3 0  3 0  Feeling bad or failure about yourself  1 1  3  0  Trouble concentrating 1 3  3 1   Moving slowly or fidgety/restless 0 2  1 3   Suicidal thoughts 0 1  2 0  PHQ-9 Score 15 13  20 8   Difficult doing work/chores  Somewhat difficult  Somewhat difficult Not difficult at all      08/24/2023   10:36 AM 08/17/2022    2:16 PM 12/30/2020    1:07 PM 01/19/2020    1:09 PM  GAD 7 : Generalized Anxiety Score  Nervous, Anxious, on Edge 3 3 3 3   Control/stop worrying 3 3 1  0  Worry too much - different things 3  3 1   Trouble relaxing 3 3 3 3   Restless 0 3 0 3  Easily annoyed or irritable 3 3 3  0  Afraid - awful might happen 3 3 3  0  Total GAD 7 Score 18  16 10   Anxiety Difficulty Somewhat difficult Very difficult Extremely difficult Somewhat difficult

## 2023-08-29 ENCOUNTER — Ambulatory Visit (HOSPITAL_COMMUNITY): Payer: Medicare Other | Admitting: Clinical

## 2023-08-29 ENCOUNTER — Encounter (HOSPITAL_COMMUNITY): Payer: Self-pay | Admitting: Clinical

## 2023-08-29 DIAGNOSIS — F603 Borderline personality disorder: Secondary | ICD-10-CM | POA: Diagnosis not present

## 2023-08-29 DIAGNOSIS — F411 Generalized anxiety disorder: Secondary | ICD-10-CM

## 2023-08-29 DIAGNOSIS — F3181 Bipolar II disorder: Secondary | ICD-10-CM
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
## 2023-08-31 ENCOUNTER — Ambulatory Visit
Admission: RE | Admit: 2023-08-31 | Discharge: 2023-08-31 | Disposition: A | Discharge status: Home / Self Care | Payer: Medicare Other | Source: Ambulatory Visit | Attending: Family Medicine | Admitting: Family Medicine

## 2023-08-31 ENCOUNTER — Other Ambulatory Visit: Payer: Self-pay

## 2023-08-31 VITALS — BP 124/81 | HR 95 | Temp 102.8°F | Resp 16

## 2023-08-31 DIAGNOSIS — R059 Cough, unspecified: Secondary | ICD-10-CM | POA: Diagnosis not present

## 2023-08-31 DIAGNOSIS — R509 Fever, unspecified: Secondary | ICD-10-CM

## 2023-08-31 DIAGNOSIS — R6889 Other general symptoms and signs: Secondary | ICD-10-CM | POA: Diagnosis not present

## 2023-08-31 MED ORDER — OSELTAMIVIR PHOSPHATE 75 MG PO CAPS: 75.0000 mg | ORAL_CAPSULE | Freq: Two times a day (BID) | ORAL | 0 refills | Status: DC

## 2023-08-31 MED ORDER — PROMETHAZINE-DM 6.25-15 MG/5ML PO SYRP: 5.0000 mL | ORAL_SOLUTION | Freq: Two times a day (BID) | ORAL | 0 refills | Status: DC | PRN

## 2023-08-31 MED ORDER — ACETAMINOPHEN 325 MG PO TABS: 650.0000 mg | ORAL_TABLET | Freq: Once | ORAL | Status: DC

## 2023-08-31 MED ORDER — BENZONATATE 200 MG PO CAPS: 200.0000 mg | ORAL_CAPSULE | Freq: Three times a day (TID) | ORAL | 0 refills | Status: AC | PRN

## 2023-08-31 MED ORDER — ACETAMINOPHEN 500 MG PO TABS
1000.0000 mg | ORAL_TABLET | Freq: Once | ORAL | Status: AC
  Administered 2023-08-31: 1000 mg via ORAL

## 2023-08-31 NOTE — ED Triage Notes (Signed)
 Pt presents to [facility] with cough, sore throat, ha, congestion, fever, sob since 2 days ago. Pt has taken sufad, vitamin c. Pt reports she also took one paxlovid  she had left over.

## 2023-08-31 NOTE — Discharge Instructions (Addendum)
 Advised patient to take medication as directed with food to completion.  Advised may take Tessalon  capsules daily or as needed for cough.  Advised may take Promethazine  DM at night for cough prior to sleep due to sedate of effects.  Advised may take OTC Tylenol  1 g every 6 hours for fever (oral temperature greater than 100.3).  Encouraged to increase daily water intake to 64 ounces per day while taking these medications.  Advised if symptoms worsen and/or unresolved please follow-up with your PCP or here for further evaluation.

## 2023-08-31 NOTE — ED Provider Notes (Signed)
 [name]    CSN: [id number] Arrival date & time: 08/31/23  1748      History   Chief Complaint Chief Complaint  Patient presents with   Cough    chest pain cant breathe - Entered by patient    HPI Kimberly Harper is a 38 y.o. female.   HPI 38 year old female presents with chest pain and difficulty breathing.  PMH significant for chest pain, asthma, and bipolar 1 disorder.  Past Medical History:  Diagnosis Date   Alcoholic (HCC)    Anemia    Phreesia 01/17/2020   Anxiety    Asthma    Asthma 06/22/2021   Bipolar 1 disorder (HCC)    Blood transfusion without reported diagnosis    Phreesia 01/17/2020   Chest pain 03/01/2022   03/2021 xr negative.   Chronic headaches    Depression    Phreesia 01/17/2020   Lactose intolerance 03/01/2022   Major depressive disorder    Nausea 03/01/2022   Pregnancy, location unknown 11/21/2021   Formatting of this note might be different from the original. [name] Quant Book Tranae [place] Language: English There is no such number on file (mobile). [phone] (home) [address] [place] [address] No e-mail address on record  [facility] (7th floor [place], [address]): M-F 7:30-18:00 [facility]: M-F 8:00-17:30 [facility]: M-F 8:30-17:00 (cl   Substance abuse (HCC)    Phreesia 01/17/2020    Patient Active Problem List   Diagnosis Date Noted   Chest injuries 07/20/2022   Vaginal pain 06/13/2022   Abnormal uterine bleeding (AUB) 03/13/2022   Nausea 03/01/2022   Chest pain 03/01/2022   Lactose intolerance 03/01/2022   Rash 03/01/2022   Epigastric pain 02/08/2022   Other allergic rhinitis 06/22/2021   Heartburn 06/22/2021   Panic disorder 04/12/2020   GAD (generalized anxiety disorder) 03/18/2020   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 Authorizing Provider  benzonatate  (TESSALON ) 200 MG capsule Take 1 capsule (200 mg total) by mouth 3 (three) times daily as needed for up to 7 days. 08/31/23 09/07/23 Yes Teddy Sharper, FNP  oseltamivir  (TAMIFLU ) 75 MG capsule Take 1 capsule (75 mg total) by mouth every 12 (twelve) hours. 08/31/23  Yes Teddy Sharper, FNP  promethazine -dextromethorphan (PROMETHAZINE -DM) 6.25-15 MG/5ML syrup Take 5 mLs by mouth 2 (two) times daily as needed for cough. 08/31/23  Yes Teddy Sharper, FNP  albuterol  (VENTOLIN  HFA) 108 (90 Base) MCG/ACT inhaler Inhale 1-2 puffs into the lungs every  6 (six) hours as needed for wheezing or shortness of breath. 06/08/22   Christopher Savannah, PA-C  fluticasone  (FLONASE ) 50 MCG/ACT nasal spray Place 1 spray into both nostrils 2 (two) times daily. 04/03/23   Stuart Vernell Norris, PA-C  pantoprazole  (PROTONIX ) 40 MG tablet TAKE 1 TABLET(40 MG) BY MOUTH DAILY 01/14/23   Jesus Bernardino MATSU, MD  pramipexole  (MIRAPEX ) 0.25 MG tablet TAKE 1 TABLET(0.25 MG) BY MOUTH THREE TIMES DAILY 05/15/23   Carvin Arvella HERO, MD  propranolol  (INDERAL ) 10 MG tablet TAKE 1 TABLET BY MOUTH TWICE DAILY AS NEEDED FOR ANXIETY 05/06/23   Carvin Arvella HERO, MD  propranolol  (INDERAL ) 20 MG tablet TAKE 1 TABLET(20 MG) BY MOUTH DAILY 07/20/22   Jesus Bernardino MATSU, MD    Family History Family History  Problem Relation Age of Onset   Hypertension Mother    Diabetes Mother    Stroke Mother    Depression Mother    Depression Sister     Irritable bowel syndrome Sister    Asthma Brother    Allergic rhinitis Brother    Migraines Brother    Depression Brother    Stroke Maternal Grandmother    Diabetes Maternal Grandmother    Alcohol abuse Maternal Grandfather    Stroke Paternal Grandmother    Breast cancer Paternal Grandmother    Alcohol abuse Paternal Grandfather    Lupus Half-Sister    Angioedema Neg Hx    Eczema Neg Hx    Immunodeficiency Neg Hx    Atopy Neg Hx     Social History Social History   Tobacco Use   Smoking status: Former    Current packs/day: 0.00    Types: Cigarettes    Quit date: 09/07/2006    Years since quitting: 16.9   Smokeless tobacco: Never  Vaping Use   Vaping status: Never Used  Substance Use Topics   Alcohol use: Yes    Comment: occ   Drug use: Not Currently     Allergies   Multiple vitamins-iron and Prednisone   Review of Systems Review of Systems   Physical Exam Triage Vital Signs ED Triage Vitals  Encounter Vitals Group     BP      Systolic BP Percentile      Diastolic BP Percentile      Pulse      Resp      Temp      Temp src      SpO2      Weight      Height      Head Circumference      Peak Flow      Pain Score      Pain Loc      Pain Education      Exclude from Growth Chart    No data found.  Updated Vital Signs BP 124/81   Pulse 95   Temp (!) 102.8 F (39.3 C)   Resp 16   SpO2 98%    Physical Exam Vitals and nursing note reviewed.  Constitutional:      General: She is not in acute distress.    Appearance: Normal appearance. She is normal weight. She is ill-appearing.  HENT:     Head: Normocephalic and atraumatic.     Right Ear: Tympanic membrane, ear canal and external ear normal.     Left Ear: Tympanic membrane, ear canal and external ear normal.     Mouth/Throat:     Mouth: Mucous membranes are moist.  Pharynx: Oropharynx is clear.  Eyes:     Extraocular Movements: Extraocular movements intact.     Conjunctiva/sclera:  Conjunctivae normal.     Pupils: Pupils are equal, round, and reactive to light.  Cardiovascular:     Rate and Rhythm: Normal rate and regular rhythm.     Pulses: Normal pulses.     Heart sounds: Normal heart sounds.  Pulmonary:     Effort: Pulmonary effort is normal.     Breath sounds: Normal breath sounds. No wheezing, rhonchi or rales.     Comments: Infrequent nonproductive cough on exam Musculoskeletal:        General: Normal range of motion.     Cervical back: Normal range of motion and neck supple.  Skin:    General: Skin is warm and dry.  Neurological:     General: No focal deficit present.     Mental Status: She is alert and oriented to person, place, and time. Mental status is at baseline.  Psychiatric:        Mood and Affect: Mood normal.        Behavior: Behavior normal.      UC Treatments / Results  Labs (all labs ordered are listed, but only abnormal results are displayed) Labs Reviewed - No data to display  EKG   Radiology No results found.  Procedures Procedures (including critical care time)  Medications Ordered in UC Medications  acetaminophen  (TYLENOL ) tablet 1,000 mg (1,000 mg Oral Given 08/31/23 1828)    Initial Impression / Assessment and Plan / UC Course  I have reviewed the triage vital signs and the nursing notes.  Pertinent labs & imaging results that were available during my care of the patient were reviewed by me and considered in my medical decision making (see chart for details).     MDM: 1.  Influenza-like symptoms-Rx'd Tamiflu  75 mg capsule: Take 1 capsule twice daily x 5 days; 2.  Cough, unspecified type-Rx'd Tessalon  200 mg capsules: Take 1 capsule 3 times daily, as needed, Rx'd Promethazine  DM 6.25-15 Mg/5 mL syrup: Take 5 mL twice daily, as needed for cough; 3.  Fever, unspecified-advised may take OTC Tylenol  1 g every 6 hours for fever (oral temperature greater than 100.3). Advised patient to take medication as directed with food to  completion.  Advised may take Tessalon  capsules daily or as needed for cough.  Advised may take Promethazine  DM at night for cough prior to sleep due to sedate of effects.  Advised may take OTC Tylenol  1 g every 6 hours for fever (oral temperature greater than 100.3).  Encouraged to increase daily water intake to 64 ounces per day while taking these medications.  Advised if symptoms worsen and/or unresolved please follow-up with your PCP or here for further evaluation.  Patient discharged home, hemodynamically stable. Final Clinical Impressions(s) / UC Diagnoses   Final diagnoses:  Influenza-like symptoms  Cough, unspecified type  Fever, unspecified     Discharge Instructions      Advised patient to take medication as directed with food to completion.  Advised may take Tessalon  capsules daily or as needed for cough.  Advised may take Promethazine  DM at night for cough prior to sleep due to sedate of effects.  Advised may take OTC Tylenol  1 g every 6 hours for fever (oral temperature greater than 100.3).  Encouraged to increase daily water intake to 64 ounces per day while taking these medications.  Advised if symptoms worsen and/or unresolved please follow-up with your  PCP or here for further evaluation.     ED Prescriptions     Medication Sig Dispense Auth. Provider   oseltamivir  (TAMIFLU ) 75 MG capsule Take 1 capsule (75 mg total) by mouth every 12 (twelve) hours. 10 capsule Vansh Reckart, FNP   benzonatate  (TESSALON ) 200 MG capsule Take 1 capsule (200 mg total) by mouth 3 (three) times daily as needed for up to 7 days. 40 capsule Jo-Ann Johanning, FNP   promethazine -dextromethorphan (PROMETHAZINE -DM) 6.25-15 MG/5ML syrup Take 5 mLs by mouth 2 (two) times daily as needed for cough. 118 mL Eeva Schlosser, FNP      PDMP not reviewed this encounter.   Teddy Sharper, FNP 08/31/23 1905

## 2023-09-03 ENCOUNTER — Telehealth: Payer: Self-pay

## 2023-09-03 NOTE — Telephone Encounter (Signed)
 Patient states she feels better and would like to go to work tomorrow. Dr. Nicholette Barley sts may go to work tomorrow as long as fever free for 24 hours. Patient made new work excuse to reflect this per her request. Placed at front desk for her to pickup. Pt would like this faxed to [phone] as well (her employer), front desk Edwina Gram sts she will do for patient. Patient advised to pick up hard copy as well and follow up with employer to make sure received

## 2023-09-12 ENCOUNTER — Encounter (HOSPITAL_COMMUNITY): Payer: Self-pay | Admitting: Clinical

## 2023-09-12 ENCOUNTER — Ambulatory Visit (INDEPENDENT_AMBULATORY_CARE_PROVIDER_SITE_OTHER): Payer: Medicare Other | Admitting: Clinical

## 2023-09-12 DIAGNOSIS — F3181 Bipolar II disorder: Secondary | ICD-10-CM | POA: Diagnosis not present

## 2023-09-12 DIAGNOSIS — F603 Borderline personality disorder: Secondary | ICD-10-CM

## 2023-09-12 DIAGNOSIS — F411 Generalized anxiety disorder: Secondary | ICD-10-CM

## 2023-09-12 NOTE — Progress Notes (Unsigned)
THERAPIST PROGRESS NOTE  Session Time: 3:05pm-3:52pm  Session #18  Virtual Visit via Video Note  I connected with [name] on 09/12/23 at  3:00 PM EST by a video enabled telemedicine application and verified that I am speaking with the correct person using two identifiers.  Location: Patient: home Provider: home office   I discussed the limitations of evaluation and management by telemedicine and the availability of in person appointments. The patient expressed understanding and agreed to proceed.   I discussed the assessment and treatment plan with the patient. The patient was provided an opportunity to ask questions and all were answered. The patient agreed with the plan and demonstrated an understanding of the instructions.   The patient was advised to call back or seek an in-person evaluation if the symptoms worsen or if the condition fails to improve as anticipated.  I provided 47 minutes of non-face-to-face time during this encounter.  Lynnell Chad, LCSW   Participation Level: Active  Behavioral Response: Casual, Alert, Depressed and Negative  Type of Therapy: Individual Therapy  Treatment Goals addressed:   Goal: LTG: Momina will score less than 5 on the Generalized Anxiety Disorder 7 Scale (GAD-7) Goal: STG: Report feeling more positive about self and abilities during therapy sessions Goal: STG: Ellina will practice problem solving skills 3 times per week for the next 4 weeks. Goal: STG: Sarah-Jane will reduce frequency of avoidant behaviors by 50% as evidenced by self-report in therapy sessions Goal: LTG: Reduce frequency, intensity, and duration of depression symptoms so that daily functioning is improved Goal: STG: Get 7-8 hours of restful sleep every night Goal: STG: Gailyn will identify cognitive patterns and beliefs that support depression Goal: LTG: Aleeha will reduce frequency of impulsive behaviors as evidenced by self-report of  cutting behaviors and relationship sabotage Goal: STG: Kenadee will identify at least 5 personal goals for emotion regulation Goal: STG: Rabiah will formulate a safety plan including warning signs, coping skills, and supports Goal: LTG: Demeka will decrease the frequency of unwanted emotions as evidenced by self-report   Goal: STG: Reduce impulsive behavior by 50% as evidenced by self-report in individual session Goal: LTG:  Danaka will improve her communication with husband, son, and other family members AEB self-report of improved relationships Goal: STG: Khylee will develop alternative coping skills to fighting verbally, cutting herself, going off her medicine, and drinking  ProgressTowards Goals: Progressing  Interventions: Supportive and Other: psychoeducation in the brain & sugar  Summary: Kimberly Harper is a 38 y.o. female who presents for therapy for her Borderline Personality Disorder, Generalized Anxiety Disorder, and Bipolar 2 (current episode depressive). She presented oriented x5 and stated she was feeling "not great, I had an anxiety attack this morning."  CSW evaluated patient's medication compliance, use of coping tools, and self-care, as applicable.   She believes the panic attack originated from seeing pictures of family member that she is no longer able to see, missing them.  She shared that she has a headache and started her cycle, so today is just not feeling good.  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  The benefit of having a second job would be to get a physical workout and to have a way to let her anger out.  Her regular job is going okay but she continues to struggle with the idea that the clients don't get enough  support that they really need.  She kept moving where she was talking from, but eventually ended up going back to the bathroom each time, stating that  she was trying to get away from "prying little ears."  When she was asked what she is doing right now to promote happiness and peace in her life, she stated, "nothing, I don't know what I can do."  She does not feel happy in her marriage currently.  CSW provided her brief psychoeducation on the detriment on depression and anxiety that sugar possesses.  She does like sweets and agreed this could be contributing.  Suicidal/Homicidal: No without intent/plan  Therapist Response:  Patient is progressing AEB engaging in scheduled therapy session.  Throughout the session, CSW gave patient the opportunity to explore thoughts and feelings associated with current life situations and past/present stressors.   CSW challenged patient gently and appropriately to consider different ways of looking at reported issues. CSW encouraged patient's expression of feelings and validated these using empathy, active listening, open body language, and unconditional positive regard.      Plan: Return again in 2 weeks on 3/5  Recommendations:  Return to therapy on 3/5, consider her diet and how it may be contributing to increased depression/anxiety  Diagnosis:  Encounter Diagnoses  Name Primary?   Borderline personality disorder (HCC) Yes   Generalized anxiety disorder    Bipolar 2 disorder, major depressive episode (HCC)    Collaboration of Care: Psychiatrist AEB    - psychiatrist can read therapy notes; therapist can and does read psychiatric notes prior to sessions  Patient/Guardian was advised Release of Information must be obtained prior to any record release in order to collaborate their care with an outside provider. Patient/Guardian was advised if they have not already done so to contact the registration department to sign all necessary forms in order for [place] to release information regarding their care.   Consent: Patient/Guardian gives verbal consent for treatment and assignment of benefits for services provided  during this visit. Patient/Guardian expressed understanding and agreed to proceed.    Lynnell Chad, LCSW 09/12/2023

## 2023-09-18 ENCOUNTER — Ambulatory Visit
Admission: RE | Admit: 2023-09-18 | Discharge: 2023-09-18 | Disposition: A | Discharge status: Home / Self Care | Payer: Medicare Other | Source: Ambulatory Visit | Attending: Emergency Medicine | Admitting: Emergency Medicine

## 2023-09-18 VITALS — BP 145/80 | HR 64 | Temp 98.2°F | Resp 18 | Ht 65.0 in | Wt 145.0 lb

## 2023-09-18 DIAGNOSIS — R058 Other specified cough: Secondary | ICD-10-CM

## 2023-09-18 MED ORDER — PROMETHAZINE-DM 6.25-15 MG/5ML PO SYRP: 5.0000 mL | ORAL_SOLUTION | Freq: Four times a day (QID) | ORAL | 0 refills | Status: DC | PRN

## 2023-09-18 NOTE — Discharge Instructions (Addendum)
 Overall your physical exam was reassuring.  Some postviral coughs can last up to 6 weeks.  Consider sleeping with a humidifier or being exposed to humidified air prior to going to bed.  Continue using your albuterol inhaler as needed.  Use the cough medicine as needed, this may cause drowsiness and sedation.  If your cough persists please follow-up with your primary care provider.  Return to clinic for any new or urgent symptoms.

## 2023-09-18 NOTE — ED Triage Notes (Signed)
 Patient c/o dry cough for a few days, just recovering from the flu.  Feels that her chest is tight, had to use an inhaler due to SOB.

## 2023-09-18 NOTE — ED Provider Notes (Signed)
 [name]    CSN: [id number] Arrival date & time: 09/18/23  1812      History   Chief Complaint Chief Complaint  Patient presents with   Cough    Coughing and tight chest - Entered by patient    HPI Kimberly Harper is a 38 y.o. female.   Patient presents to clinic over complaints of a continued dry cough since being diagnosed with the flu on February 7.  Her symptoms initially got better on Tamiflu, with benzonatate and Promethazine DM but returned shortly after completing these medications.  She feels like her chest is tight and has been using an albuterol inhaler intermittently due to her feelings of shortness of breath.  She is not having any fevers.  No fatigue.  Coughing is worse at night.  Cough is nonproductive.  The history is provided by the patient and medical records.  Cough   Past Medical History:  Diagnosis Date   Alcoholic (HCC)    Anemia    Phreesia 01/17/2020   Anxiety    Asthma    Asthma 06/22/2021   Bipolar 1 disorder (HCC)    Blood transfusion without reported diagnosis    Phreesia 01/17/2020   Chest pain 03/01/2022   03/2021 xr negative.   Chronic headaches    Depression    Phreesia 01/17/2020   Lactose intolerance 03/01/2022   Major depressive disorder    Nausea 03/01/2022   Pregnancy, location unknown 11/21/2021   Formatting of this note might be different from the original. [facility] Quant Book Doralyn Lords Language: English There is no such number on file (mobile). [phone] (home) [address] [place] [address] No e-mail address on record  [facility] (7th floor [place], [address]): M-F 7:30-18:00 [facility]: M-F 8:00-17:30 [facility]: M-F 8:30-17:00 (cl   Substance abuse (HCC)    Phreesia 01/17/2020    Patient Active Problem List   Diagnosis Date Noted   Chest injuries 07/20/2022   Vaginal pain 06/13/2022   Abnormal uterine bleeding (AUB) 03/13/2022   Nausea 03/01/2022    Chest pain 03/01/2022   Lactose intolerance 03/01/2022   Rash 03/01/2022   Epigastric pain 02/08/2022   Other allergic rhinitis 06/22/2021   Heartburn 06/22/2021   Panic disorder 04/12/2020   GAD (generalized anxiety disorder) 03/18/2020   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   Borderline personality disorder (HCC) 02/06/2020   Bipolar 2 disorder, major depressive episode (HCC) 12/06/2019    Past Surgical History:  Procedure Laterality Date   CESAREAN SECTION N/A    Phreesia 01/17/2020   COLONOSCOPY     earlier in year 2022   DILATION AND CURETTAGE OF UTERUS     HAND SURGERY Right    WISDOM TOOTH EXTRACTION      OB History     Gravida  4   Para  1   Term      Preterm  1   AB  3   Living  1      SAB  3   IAB      Ectopic      Multiple      Live Births  1            Home Medications    Prior to Admission medications   Medication Sig Start Date End Date Taking? Authorizing Provider  albuterol (VENTOLIN HFA) 108 (90 Base) MCG/ACT inhaler Inhale 1-2 puffs into the lungs every 6 (six) hours  as needed for wheezing or shortness of breath. 06/08/22  Yes Wallis Bamberg, PA-C  pantoprazole (PROTONIX) 40 MG tablet TAKE 1 TABLET(40 MG) BY MOUTH DAILY 01/14/23  Yes Lula Olszewski, MD  pramipexole (MIRAPEX) 0.25 MG tablet TAKE 1 TABLET(0.25 MG) BY MOUTH THREE TIMES DAILY 05/15/23  Yes Stasia Cavalier, MD  promethazine-dextromethorphan (PROMETHAZINE-DM) 6.25-15 MG/5ML syrup Take 5 mLs by mouth 4 (four) times daily as needed for cough. 09/18/23  Yes Rinaldo Ratel, [place] N, FNP  propranolol (INDERAL) 10 MG tablet TAKE 1 TABLET BY MOUTH TWICE DAILY AS NEEDED FOR ANXIETY 05/06/23  Yes Stasia Cavalier, MD  propranolol (INDERAL) 20 MG tablet TAKE 1 TABLET(20 MG) BY MOUTH DAILY 07/20/22  Yes Lula Olszewski, MD  fluticasone [facility]) 50 MCG/ACT nasal spray Place 1 spray into both nostrils 2 (two) times daily. 04/03/23   Particia Nearing, PA-C    Family History Family History   Problem Relation Age of Onset   Hypertension Mother    Diabetes Mother    Stroke Mother    Depression Mother    Depression Sister    Irritable bowel syndrome Sister    Asthma Brother    Allergic rhinitis Brother    Migraines Brother    Depression Brother    Stroke Maternal Grandmother    Diabetes Maternal Grandmother    Alcohol abuse Maternal Grandfather    Stroke Paternal Grandmother    Breast cancer Paternal Grandmother    Alcohol abuse Paternal Grandfather    Lupus Half-Sister    Angioedema Neg Hx    Eczema Neg Hx    Immunodeficiency Neg Hx    Atopy Neg Hx     Social History Social History   Tobacco Use   Smoking status: Former    Current packs/day: 0.00    Types: Cigarettes    Quit date: 09/07/2006    Years since quitting: 17.0   Smokeless tobacco: Never  Vaping Use   Vaping status: Never Used  Substance Use Topics   Alcohol use: Yes    Comment: occ   Drug use: Not Currently     Allergies   Multiple vitamins-iron and Prednisone   Review of Systems Review of Systems  Per HPI   Physical Exam Triage Vital Signs ED Triage Vitals  Encounter Vitals Group     BP 09/18/23 1822 (!) 145/80     Systolic BP Percentile --      Diastolic BP Percentile --      Pulse Rate 09/18/23 1822 64     Resp 09/18/23 1822 18     Temp 09/18/23 1822 98.2 F (36.8 C)     Temp Source 09/18/23 1822 Oral     SpO2 09/18/23 1822 100 %     Weight 09/18/23 1824 145 lb (65.8 kg)     Height 09/18/23 1824 5\' 5"  (1.651 m)     Head Circumference --      Peak Flow --      Pain Score 09/18/23 1824 0     Pain Loc --      Pain Education --      Exclude from Growth Chart --    No data found.  Updated Vital Signs BP (!) 145/80 (BP Location: Right Arm)   Pulse 64   Temp 98.2 F (36.8 C) (Oral)   Resp 18   Ht 5\' 5"  (1.651 m)   Wt 145 lb (65.8 kg)   LMP 09/12/2023   SpO2 100%   BMI 24.13 kg/m  Visual Acuity Right Eye Distance:   Left Eye Distance:   Bilateral  Distance:    Right Eye Near:   Left Eye Near:    Bilateral Near:     Physical Exam Vitals and nursing note reviewed.  Constitutional:      Appearance: Normal appearance.  HENT:     Head: Normocephalic and atraumatic.     Right Ear: External ear normal.     Left Ear: External ear normal.     Nose: Nose normal.     Mouth/Throat:     Mouth: Mucous membranes are moist.  Eyes:     Conjunctiva/sclera: Conjunctivae normal.  Cardiovascular:     Rate and Rhythm: Normal rate and regular rhythm.     Heart sounds: Normal heart sounds. No murmur heard. Pulmonary:     Effort: Pulmonary effort is normal. No respiratory distress.     Breath sounds: Normal breath sounds.  Skin:    General: Skin is warm and dry.  Neurological:     General: No focal deficit present.     Mental Status: She is alert and oriented to person, place, and time.  Psychiatric:        Mood and Affect: Mood normal.        Behavior: Behavior normal.      UC Treatments / Results  Labs (all labs ordered are listed, but only abnormal results are displayed) Labs Reviewed - No data to display  EKG   Radiology No results found.  Procedures Procedures (including critical care time)  Medications Ordered in UC Medications - No data to display  Initial Impression / Assessment and Plan / UC Course  I have reviewed the triage vital signs and the nursing notes.  Pertinent labs & imaging results that were available during my care of the patient were reviewed by me and considered in my medical decision making (see chart for details).  Vitals and triage reviewed, patient is hemodynamically stable.  Oxygenation of 100%, able to speak in full sentences.  Lungs are vesicular, heart with regular rate and rhythm.  Suspect postviral cough versus bronchitis.  Offered steroids, patient declined due to adverse reactions and bipolar disorder.  Will trial continuation of Promethazine DM and symptomatic management.  Does not appear  to be having an asthma exacerbation at this time.  Plan of care, follow-up care return precautions given, no questions at this time.    Final Clinical Impressions(s) / UC Diagnoses   Final diagnoses:  Post-viral cough syndrome     Discharge Instructions      Overall your physical exam was reassuring.  Some postviral coughs can last up to 6 weeks.  Consider sleeping with a humidifier or being exposed to humidified air prior to going to bed.  Continue using your albuterol inhaler as needed.  Use the cough medicine as needed, this may cause drowsiness and sedation.  If your cough persists please follow-up with your primary care provider.  Return to clinic for any new or urgent symptoms.    ED Prescriptions     Medication Sig Dispense Auth. Provider   promethazine-dextromethorphan (PROMETHAZINE-DM) 6.25-15 MG/5ML syrup Take 5 mLs by mouth 4 (four) times daily as needed for cough. 118 mL Jaman Aro, [place] N, [place]      PDMP not reviewed this encounter.   Kaio Kuhlman, [place] N, [place] 09/18/23 [phone]

## 2023-09-20 ENCOUNTER — Ambulatory Visit: Payer: Medicare Other | Admitting: Family Medicine

## 2023-09-26 ENCOUNTER — Ambulatory Visit (INDEPENDENT_AMBULATORY_CARE_PROVIDER_SITE_OTHER): Payer: Medicare Other | Admitting: Clinical

## 2023-09-26 DIAGNOSIS — Z91199 Patient's noncompliance with other medical treatment and regimen due to unspecified reason: Secondary | ICD-10-CM

## 2023-09-26 NOTE — Progress Notes (Addendum)
 BH MD/PA/NP OP Progress Note  09/27/2023 5:14 PM Kimberly Harper  MRN:  [id number]  Visit Diagnosis:    ICD-10-CM   1. GAD (generalized anxiety disorder)  F41.1 pramipexole (MIRAPEX) 0.25 MG tablet    propranolol (INDERAL) 20 MG tablet    propranolol (INDERAL) 10 MG tablet    2. Panic disorder  F41.0 propranolol (INDERAL) 20 MG tablet      Assessment: Kimberly Harper is a 38 y.o. female with a history of borderline personality disorder, Bipolar 2 disorder, PTSD,  GAD, and panic disorder who presents virtually to [facility] at [facility] for initial evaluation on 09/12/2022.  At initial evaluation patient reported symptoms of increased anxiety, fatigue, tearfulness, negative self thoughts, worthlessness, nonsuicidal self-injury, and intermittent SI without intent or plan.  Safety planning was discussed as well as alternative means to try manage nonsuicidal self-injury.  Patient had a significant past history of bipolar 2 disorder endorsed episodes of hypomania in the past which can include decreased sleep, increased energy, reckless and impulsive behaviors, and excessively elevated mood.  These episodes can last 1 to 3 days.  She denied any episodes of full mania.  Of note patient was also screened for borderline personality disorder which she has been diagnosed with in the past and did endorse real or perceived feelings of abandonment, chronic feelings of emptiness, splitting, impulsive behaviors, difficulty with interpersonal relationships, poor sense of self, and NSSI.  Patient met criteria for bipolar 2 disorder, borderline personality disorder, and generalized anxiety disorder.  While she does have a significant past history of trauma and some traits of PTSD she does not have enough to meet criteria.  Treatment options were discussed however patient notes trying a number of medications in the past with poor or sometimes negative effect including nausea,  excessive sedation, or increased suicidality.  She was not interested in starting a mood stabilizer at this time.  Due to the bipolar 2 diagnosis we would not start an antidepressant medication without some mood stabilization.  Patient was open to as needed medication for anxiety.   Kimberly Harper presents for follow-up evaluation. Today, 09/27/23, patient reports depressed mood, tearfulness, anxiety, and fears something awful happening.  She has engaged in self-harm last in January though denies any thoughts or actions since.  She also denies any SI at this time.  Current symptoms are worsened by ongoing interpersonal stressors in her marriage.  During the past 8 months patient has been inconsistent with her medications.  When she does take them consistently she still endorses benefit.  We had discussed potential medication adjustments which she ultimately declined due to concern of adverse side effects.  Patient preferred to continue on her current regimen.  We will continue on her current regimen and follow-up in a month.  Of note patient did mention that there is something else going on that she did not wish to report to the provider due to concern to get her partner in trouble.  Support was provided around this and we reviewed what were reportable events.  Patient was encouraged to share further if she felt comfortable.  Ultimately she declines to share and was not interested in any potential resources such as women shelters today.  She was encouraged to reach out or share with her therapist in the future if she felt more comfortable.   Psychotherapeutic interventions were used during today's session. From 4:10 PM to 4:33 PM we used empathic listening techniques and provided support. Used supportive  interviewing techniques to validate patients feelings. Improvement was evidenced by patient's participation.  Plan: - Continue pramipexole to 0.25 mg QD due to nausea  - Started Phenergan 5 mg Q8H  prn for nausea, recommend f/u with PCP for continued management - Continue Propranolol 20 mg QD for migraines - Change Propranolol to 10 mg BID  - CMP, Beta HCG reviewed - Continue therapy with Ambrose Mantle - Recommend DBT - Will discuss LOA letter with patients therapist - Crisis resources reviewed - Follow up in a month  Chief Complaint:  Chief Complaint  Patient presents with   Follow-up   HPI: Kimberly Harper presents for follow up after her last appointment in August of 2024.  In the interim she reports that things have not been good but are also not terrible.  There have been a lot of ongoing interpersonal issues in her marriage.  The marriage has not been going very well patient reports that she has reached a point where she feels she is just accepted that things are not going to be the way she would like.  There are a lot of underlying issues including the fact that her spouse blames the patient for having to pay child support per patient's report.  Overall [name] feels that the dynamics of the relationship is not balanced.  She feels that she is held responsible or accountable for things while he believes he does no wrong.  This has really led to a negative impact on her mental health.  They have talked about counseling but it is up to [facility] to set it up.  Furthermore every time they have talked about these issues nothing has come from it.  Patient endorses that she did self harm back in January due to the negative interpersonal reaction and lack of support from her partner.  She had presented to the hospital but did not stay.  Patient reports that she has not self harmed since and is working to not let his negative influence had an impact on her own mental health particularly and desire to self-harm.  Patient notes that she is currently living with her husband does not really have anywhere else she can go.  She has moved a few times but is trying to save money to move into the house  of her own instead of getting an apartment.  Patient reports that she does her best to avoid him during the day and is just trying to get by until she has enough money saved.  In regards to medication she reports noncompliance.  When things are going on the personal life she will often forget to take the medications.  When she is taking them consistently she does feel a little bit better.  We had discussed the possibility of adjusting her medication regimen.  While patient does feel like this could be beneficial she was hesitant to do so.  She has had adverse side effects to number of medications in the past and does not want to risk that occurring when she is in such a difficult place.  At the end of the session patient became increasingly tearful.  When asked about this she stated that there are other things going on but she does not want to report them as it could have a impact on her partner's job.  She feels like she is not able to share these things with the people around her and only to mention small details.  This is in the hope that his family will  push him to getting help.  We provided support and asked the patient if she would be comfortable elaborating.  She declined though did state that there is no child abuse occurring.  Ultimately she was given space and encouraged to reach out to provider or her therapist in the future if she is willing to talk.  Past Psychiatric History: Patient had seen Dr. Hinton Dyer in the past and the [facility] more recently. She reports that she has been hospitalized multiple times including once in [place], several times in [place], and twice at [facility].  She has been through the [facility] in 2021 and again in 2023.  Has used Abilify, Seroquel (weight gain), Geodon, Latuda, Zoloft, Wellbutrin, Remeron, Celexa, trazodone, trintellix, BuSpar, propranolol, gabapentin, Atarax,  Prazosin, Lamictal, lithium, Xanax  (benefit, but memory issues). A lot of these made her too nauseas or too sleepy.  Reports having 2-4 alcoholic drinks a couple times a month. Alsoe endorsed marijuana use in the past. Last in 2008. She denies any other substance use history.   Past Medical History:  Past Medical History:  Diagnosis Date   Alcoholic (HCC)    Anemia    Phreesia 01/17/2020   Anxiety    Asthma    Asthma 06/22/2021   Bipolar 1 disorder (HCC)    Blood transfusion without reported diagnosis    Phreesia 01/17/2020   Chest pain 03/01/2022   03/2021 xr negative.   Chronic headaches    Depression    Phreesia 01/17/2020   Lactose intolerance 03/01/2022   Major depressive disorder    Nausea 03/01/2022   Pregnancy, location unknown 11/21/2021   Formatting of this note might be different from the original. [facility] Quant Book Jalia Louk Language: English There is no such number on file (mobile). [phone] (home) [address] [place] [address] No e-mail address on record  Lab Hours [place] (7th floor [place], [address]): M-F 7:30-18:00 [facility]: M-F 8:00-17:30 [facility]: M-F 8:30-17:00 (cl   Substance abuse (HCC)    Phreesia 01/17/2020    Past Surgical History:  Procedure Laterality Date   CESAREAN SECTION N/A    Phreesia 01/17/2020   COLONOSCOPY     earlier in year 2022   DILATION AND CURETTAGE OF UTERUS     HAND SURGERY Right    WISDOM TOOTH EXTRACTION     Family History:  Family History  Problem Relation Age of Onset   Hypertension Mother    Diabetes Mother    Stroke Mother    Depression Mother    Depression Sister    Irritable bowel syndrome Sister    Asthma Brother    Allergic rhinitis Brother    Migraines Brother    Depression Brother    Stroke Maternal Grandmother    Diabetes Maternal Grandmother    Alcohol abuse Maternal Grandfather    Stroke Paternal Grandmother    Breast cancer Paternal Grandmother    Alcohol abuse Paternal Grandfather     Lupus Half-Sister    Angioedema Neg Hx    Eczema Neg Hx    Immunodeficiency Neg Hx    Atopy Neg Hx     Social History:  Social History   Socioeconomic History   Marital status: Single    Spouse name: Not on file   Number of children: 1   Years of education: Not on file   Highest education level: Not on file  Occupational  History   Occupation: Academic librarian  Tobacco Use   Smoking status: Former    Current packs/day: 0.00    Types: Cigarettes    Quit date: 09/07/2006    Years since quitting: 17.0   Smokeless tobacco: Never  Vaping Use   Vaping status: Never Used  Substance and Sexual Activity   Alcohol use: Yes    Comment: occ   Drug use: Not Currently   Sexual activity: Yes    Birth control/protection: None  Other Topics Concern   Not on file  Social History Narrative   Right handed   Social Drivers of Health   Financial Resource Strain: Not on file  Food Insecurity: Not on file  Transportation Needs: Not on file  Physical Activity: Not on file  Stress: Not on file  Social Connections: Unknown (11/21/2021)   [facility]   Social Network    Social Network: Not on file    Allergies:  Allergies  Allergen Reactions   Multiple Vitamins-Iron Swelling    States that vitamins, folic acid and iron have caused swelling of tongue and gums   Prednisone Other (See Comments)    Altered mental status    Current Medications: Current Outpatient Medications  Medication Sig Dispense Refill   albuterol (VENTOLIN HFA) 108 (90 Base) MCG/ACT inhaler Inhale 1-2 puffs into the lungs every 6 (six) hours as needed for wheezing or shortness of breath. 18 g 0   fluticasone (FLONASE) 50 MCG/ACT nasal spray Place 1 spray into both nostrils 2 (two) times daily. 16 g 2   pantoprazole (PROTONIX) 40 MG tablet TAKE 1 TABLET(40 MG) BY MOUTH DAILY 90 tablet 1   pramipexole (MIRAPEX) 0.25 MG tablet TAKE 1 TABLET(0.25 MG) BY MOUTH THREE TIMES DAILY 90 tablet 0    promethazine-dextromethorphan (PROMETHAZINE-DM) 6.25-15 MG/5ML syrup Take 5 mLs by mouth 4 (four) times daily as needed for cough. 118 mL 0   propranolol (INDERAL) 10 MG tablet Take 1 tablet (10 mg total) by mouth 2 (two) times daily as needed. for anxiety 180 tablet 0   propranolol (INDERAL) 20 MG tablet TAKE 1 TABLET(20 MG) BY MOUTH DAILY 90 tablet 0   No current facility-administered medications for this visit.     Psychiatric Specialty Exam: Review of Systems  Last menstrual period 09/12/2023.There is no height or weight on file to calculate BMI.  General Appearance: Fairly Groomed  Eye Contact:  Good  Speech:  Clear and Coherent  Volume:  Normal  Mood:  Depressed and Euthymic  Affect:  Labile and Tearful  Thought Process:  Coherent  Orientation:  Full (Time, Place, and Person)  Thought Content: Logical   Suicidal Thoughts:  No  Homicidal Thoughts:  No  Memory:  Immediate;   Good  Judgement:  Fair  Insight:  Lacking  Psychomotor Activity:  Normal  Concentration:  Concentration: Fair  Recall:  Fair  Fund of Knowledge: Fair  Language: Good  Akathisia:  NA    AIMS (if indicated): not done  Assets:  Communication Skills Desire for Improvement Housing Talents/Skills Transportation Vocational/Educational  ADL's:  Intact  Cognition: WNL  Sleep:  Fair   Metabolic Disorder Labs: Lab Results  Component Value Date   HGBA1C 5.3 01/19/2020   MPG 105 12/07/2019   No results found for: "PROLACTIN" Lab Results  Component Value Date   CHOL 189 01/19/2020   TRIG 109 01/19/2020   HDL 47 01/19/2020   CHOLHDL 4.0 01/19/2020   VLDL 9 12/07/2019  LDLCALC 122 (H) 01/19/2020   LDLCALC 141 (H) 12/07/2019   Lab Results  Component Value Date   TSH 1.16 04/12/2021   TSH 0.50 01/20/2021    Therapeutic Level Labs: No results found for: "LITHIUM" No results found for: "VALPROATE" No results found for: "CBMZ"   Screenings: AIMS    Flowsheet Row Admission (Discharged)  [facility]  AIMS Total Score 0      AUDIT    Flowsheet Row Admission (Discharged) from [facility]  Alcohol Use Disorder Identification Test Final Score (AUDIT) 1      GAD-7    Flowsheet Row Counselor from 08/24/2023 in [facility] at [facility] Visit from 12/30/2020 in [facility] Visit from 01/19/2020 in Primary Care at [facility]  Total GAD-7 Score 18 16 10       PHQ2-9    Flowsheet Row Counselor from 08/24/2023 in [facility] at [facility] from 08/17/2022 in [facility] at [facility] Visit from 03/01/2022 in [facility] [place] Counselor from 08/01/2021 in [facility] Office Visit from 04/12/2021 in [facility] Total Score 4 2 0 4 1  PHQ-9 Total Score 16 13 -- 20 8      Flowsheet Row ED from 09/18/2023 in [facility] ED from 08/31/2023 in [facility] ED from 07/08/2023 in [facility] at [facility])  C-SSRS RISK CATEGORY No Risk No Risk No Risk       Collaboration of Care: Collaboration of Care: Medication Management AEB medication prescription, Other provider involved in patient's care AEB ED chart review, and Referral or follow-up with counselor/therapist AEB chart review  Patient/Guardian was advised Release of Information must be obtained prior to any record release in order to collaborate their care with an outside provider. Patient/Guardian was advised if they have not already done so to contact the registration department to sign all necessary forms in order for [place] to release information regarding their care.   Consent:  Patient/Guardian gives verbal consent for treatment and assignment of benefits for services provided during this visit. Patient/Guardian expressed understanding and agreed to proceed.    Stasia Cavalier, MD 09/27/2023, 5:14 PM   Virtual Visit via Video Note  I connected with Morton Stall on 09/27/23 at  4:00 PM EST by a video enabled telemedicine application and verified that I am speaking with the correct person using two identifiers.  Location: Patient: Home Provider: Home Office   I discussed the limitations of evaluation and management by telemedicine and the availability of in person appointments. The patient expressed understanding and agreed to proceed.   I discussed the assessment and treatment plan with the patient. The patient was provided an opportunity to ask questions and all were answered. The patient agreed with the plan and demonstrated an understanding of the instructions.   The patient was advised to call back or seek an in-person evaluation if the symptoms worsen or if the condition fails to improve as anticipated.  I provided 30 minutes of non-face-to-face time during this encounter.   Stasia Cavalier, MD

## 2023-09-27 ENCOUNTER — Other Ambulatory Visit (HOSPITAL_COMMUNITY): Payer: Self-pay | Admitting: Psychiatry

## 2023-09-27 ENCOUNTER — Telehealth (HOSPITAL_COMMUNITY): Payer: Medicare Other | Admitting: Psychiatry

## 2023-09-27 DIAGNOSIS — F411 Generalized anxiety disorder: Secondary | ICD-10-CM

## 2023-09-27 DIAGNOSIS — F410 Panic disorder [episodic paroxysmal anxiety] without agoraphobia: Secondary | ICD-10-CM | POA: Diagnosis not present

## 2023-09-27 MED ORDER — PROPRANOLOL HCL 10 MG PO TABS: 10.0000 mg | ORAL_TABLET | Freq: Two times a day (BID) | ORAL | 0 refills | Status: DC | PRN

## 2023-09-27 MED ORDER — PROPRANOLOL HCL 20 MG PO TABS: ORAL_TABLET | ORAL | 0 refills | Status: DC

## 2023-09-27 MED ORDER — PRAMIPEXOLE DIHYDROCHLORIDE 0.25 MG PO TABS
ORAL_TABLET | ORAL | 0 refills | Status: DC
Start: 2023-09-27 — End: 2023-09-27

## 2023-09-28 ENCOUNTER — Encounter (HOSPITAL_COMMUNITY): Payer: Self-pay | Admitting: Psychiatry

## 2023-09-28 NOTE — Progress Notes (Signed)
 Therapy Progress Note  Patient had an appointment scheduled with therapist on 09/28/2023  at 3:00pm.  Patient called at 3:15pm and informed front desk that she forgot the appointment.  She did not arrive for the session by 3:30pm, so was considered a no show.  Encounter Diagnosis  Name Primary?   No-show for appointment Yes     Ambrose Mantle, LCSW 09/28/2023, 1:00 PM

## 2023-10-04 ENCOUNTER — Ambulatory Visit: Payer: Medicare Other | Admitting: Internal Medicine

## 2023-10-05 ENCOUNTER — Ambulatory Visit: Admitting: Internal Medicine

## 2023-10-08 ENCOUNTER — Ambulatory Visit
Admission: RE | Admit: 2023-10-08 | Discharge: 2023-10-08 | Disposition: A | Discharge status: Home / Self Care | Source: Ambulatory Visit | Attending: Internal Medicine | Admitting: Internal Medicine

## 2023-10-08 VITALS — BP 136/80 | HR 67 | Temp 98.2°F | Resp 18 | Ht 65.0 in | Wt 145.0 lb

## 2023-10-08 DIAGNOSIS — T148XXA Other injury of unspecified body region, initial encounter: Secondary | ICD-10-CM | POA: Diagnosis not present

## 2023-10-08 NOTE — ED Provider Notes (Signed)
 [name]    CSN: [id number] Arrival date & time: 10/08/23  1748      History   Chief Complaint Chief Complaint  Patient presents with   Abrasion    Entered by patient    HPI Kimberly Harper is a 38 y.o. female.   HPI 38 year old female presents with possible infection of left upper arm secondary to skin abrasions.  PMH significant for bipolar 2 disorder, panic disorder, and asthma.  Past Medical History:  Diagnosis Date   Alcoholic (HCC)    Anemia    Phreesia 01/17/2020   Anxiety    Asthma    Asthma 06/22/2021   Bipolar 1 disorder (HCC)    Blood transfusion without reported diagnosis    Phreesia 01/17/2020   Chest pain 03/01/2022   03/2021 xr negative.   Chronic headaches    Depression    Phreesia 01/17/2020   Lactose intolerance 03/01/2022   Major depressive disorder    Nausea 03/01/2022   Pregnancy, location unknown 11/21/2021   Formatting of this note might be different from the original. [facility] Quant Book Barrie Lipps Language: English There is no such number on file (mobile). [phone] (home) [address] [place] [address] No e-mail address on record  [facility] [place] (7th floor [place], [address]): M-F 7:30-18:00 [facility]: M-F 8:00-17:30 [facility]: M-F 8:30-17:00 (cl   Substance abuse (HCC)    Phreesia 01/17/2020    Patient Active Problem List   Diagnosis Date Noted   Chest injuries 07/20/2022   Vaginal pain 06/13/2022   Abnormal uterine bleeding (AUB) 03/13/2022   Nausea 03/01/2022   Chest pain 03/01/2022   Lactose intolerance 03/01/2022   Rash 03/01/2022   Epigastric pain 02/08/2022   Other allergic rhinitis 06/22/2021   Heartburn 06/22/2021   Panic disorder 04/12/2020   GAD (generalized anxiety disorder) 03/18/2020   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 Authorizing Provider  albuterol (VENTOLIN HFA) 108 (90 Base) MCG/ACT inhaler Inhale 1-2 puffs into the lungs every 6 (six) hours as needed for wheezing or shortness of breath. 06/08/22  Yes Wallis Bamberg, PA-C  fluticasone (FLONASE) 50 MCG/ACT nasal spray Place 1 spray into both nostrils 2 (two) times daily. 04/03/23  Yes Particia Nearing, PA-C  pantoprazole (PROTONIX) 40 MG tablet TAKE 1 TABLET(40 MG) BY MOUTH DAILY 01/14/23  Yes Lula Olszewski, MD  pramipexole (MIRAPEX) 0.25 MG tablet TAKE 1 TABLET(0.25 MG) BY MOUTH THREE TIMES DAILY 09/27/23  Yes Mercy Riding,  Leeanne Deed, MD  promethazine-dextromethorphan (PROMETHAZINE-DM) 6.25-15 MG/5ML syrup Take 5 mLs by mouth 4 (four) times daily as needed for cough. 09/18/23  Yes Rinaldo Ratel, [place] N, FNP  propranolol (INDERAL) 10 MG tablet Take 1 tablet (10 mg total) by mouth 2 (two) times daily as needed. for anxiety 09/27/23  Yes Stasia Cavalier, MD  propranolol (INDERAL) 20 MG tablet TAKE 1 TABLET(20 MG) BY MOUTH DAILY 09/27/23  Yes Stasia Cavalier, MD    Family History Family History  Problem Relation Age of Onset   Hypertension Mother    Diabetes Mother    Stroke Mother    Depression Mother    Depression Sister    Irritable bowel syndrome Sister    Asthma Brother    Allergic rhinitis Brother    Migraines Brother    Depression Brother    Stroke Maternal Grandmother    Diabetes Maternal Grandmother    Alcohol abuse Maternal Grandfather    Stroke Paternal  Grandmother    Breast cancer Paternal Grandmother    Alcohol abuse Paternal Grandfather    Lupus Half-Sister    Angioedema Neg Hx    Eczema Neg Hx    Immunodeficiency Neg Hx    Atopy Neg Hx     Social History Social History   Tobacco Use   Smoking status: Former    Current packs/day: 0.00    Types: Cigarettes    Quit date: 09/07/2006    Years since quitting: 17.0   Smokeless tobacco: Never  Vaping Use   Vaping status: Never Used  Substance Use Topics   Alcohol use: Yes    Comment: occ   Drug use: Not Currently     Allergies   Neosporin [bacitracin-polymyxin b], Multiple vitamins-iron, Mupirocin, and Prednisone   Review of Systems Review of Systems  Skin:  Positive for rash and wound.     Physical Exam Triage Vital Signs ED Triage Vitals  Encounter Vitals Group     BP      Systolic BP Percentile      Diastolic BP Percentile      Pulse      Resp      Temp      Temp src      SpO2      Weight      Height      Head Circumference      Peak Flow      Pain Score      Pain Loc      Pain Education      Exclude from Growth Chart    No data found.  Updated Vital Signs BP 136/80 (BP Location: Right Arm)   Pulse 67   Temp 98.2 F (36.8 C) (Oral)   Resp 18   Ht 5\' 5"  (1.651 m)   Wt 145 lb (65.8 kg)   LMP 09/12/2023   SpO2 100%   BMI 24.13 kg/m    Physical Exam Vitals and nursing note reviewed.  Constitutional:      Appearance: Normal appearance. She is normal weight.  HENT:     Head: Normocephalic and atraumatic.     Mouth/Throat:     Mouth: Mucous membranes are moist.     Pharynx: Oropharynx is clear.  Eyes:     Extraocular Movements: Extraocular movements intact.     Conjunctiva/sclera: Conjunctivae normal.     Pupils: Pupils are equal, round, and reactive to light.  Cardiovascular:     Rate and Rhythm: Normal rate and  regular rhythm.     Pulses: Normal pulses.     Heart sounds: Normal heart sounds.  Pulmonary:     Effort: Pulmonary  effort is normal.     Breath sounds: Normal breath sounds. No wheezing, rhonchi or rales.  Musculoskeletal:        General: Normal range of motion.     Cervical back: Normal range of motion and neck supple.  Skin:    General: Skin is warm and dry.     Comments: Left upper arm (superior lateral aspect): 3 separate well-healing skin avulsions with eschar formation noted-see image below  Neurological:     General: No focal deficit present.     Mental Status: She is alert and oriented to person, place, and time. Mental status is at baseline.  Psychiatric:        Mood and Affect: Mood normal.        Behavior: Behavior normal.      UC Treatments / Results  Labs (all labs ordered are listed, but only abnormal results are displayed) Labs Reviewed - No data to display  EKG   Radiology No results found.  Procedures Procedures (including critical care time)  Medications Ordered in UC Medications - No data to display  Initial Impression / Assessment and Plan / UC Course  I have reviewed the triage vital signs and the nursing notes.  Pertinent labs & imaging results that were available during my care of the patient were reviewed by me and considered in my medical decision making (see chart for details).     MDM: 1.  Skin avulsion-Advised patient to complete previously prescribed Keflex.  Advised patient to leave affected area open to air and allow healing by secondary intention/scab formation.  Advised if signs and symptoms of infection (redness and/or swelling).  Please follow-up for further evaluation.  Encourage patient to increase daily water intake while taking this medication.  Advised if symptoms worsen and/or unresolved please follow-up with your PCP or here for further evaluation.  Patient discharged home, hemodynamically stable. Final Clinical Impressions(s) / UC Diagnoses   Final diagnoses:  Skin avulsion     Discharge Instructions      Advised patient to complete  previously prescribed Keflex.  Advised patient to leave affected area open to air and allow healing by secondary intention/scab formation.  Advised if signs and symptoms of infection (redness and/or swelling).  Please follow-up for further evaluation.  Encourage patient to increase daily water intake while taking this medication.  Advised if symptoms worsen and/or unresolved please follow-up with your PCP or here for further evaluation.     ED Prescriptions   None    PDMP not reviewed this encounter.   Trevor Iha, FNP 10/08/23 1845

## 2023-10-08 NOTE — ED Triage Notes (Signed)
 Patient c/o "places on left upper arm" x 1 week that look infected.  Patient was seen last week at another urgent care and given Mupirocin to apply to areas and Keflex to take by mouth.  The area seem to have clear drainage at one point now bloody per patient.

## 2023-10-08 NOTE — Discharge Instructions (Addendum)
 Advised patient to complete previously prescribed Keflex.  Advised patient to leave affected area open to air and allow healing by secondary intention/scab formation.  Advised if signs and symptoms of infection (redness and/or swelling).  Please follow-up for further evaluation.  Encourage patient to increase daily water intake while taking this medication.  Advised if symptoms worsen and/or unresolved please follow-up with your PCP or here for further evaluation.

## 2023-10-10 ENCOUNTER — Encounter (HOSPITAL_COMMUNITY): Payer: Self-pay | Admitting: Clinical

## 2023-10-10 ENCOUNTER — Ambulatory Visit (INDEPENDENT_AMBULATORY_CARE_PROVIDER_SITE_OTHER): Payer: Medicare Other | Admitting: Clinical

## 2023-10-10 DIAGNOSIS — F411 Generalized anxiety disorder: Secondary | ICD-10-CM

## 2023-10-10 DIAGNOSIS — F603 Borderline personality disorder: Secondary | ICD-10-CM

## 2023-10-10 DIAGNOSIS — F3181 Bipolar II disorder: Secondary | ICD-10-CM
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
## 2023-10-24 ENCOUNTER — Encounter (HOSPITAL_COMMUNITY): Payer: Self-pay | Admitting: Clinical

## 2023-10-24 ENCOUNTER — Ambulatory Visit (INDEPENDENT_AMBULATORY_CARE_PROVIDER_SITE_OTHER): Payer: Medicare Other | Admitting: Clinical

## 2023-10-24 DIAGNOSIS — F3181 Bipolar II disorder: Secondary | ICD-10-CM | POA: Diagnosis not present

## 2023-10-24 DIAGNOSIS — F411 Generalized anxiety disorder: Secondary | ICD-10-CM
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
## 2023-11-08 ENCOUNTER — Ambulatory Visit (HOSPITAL_COMMUNITY): Admitting: Clinical

## 2023-11-08 ENCOUNTER — Encounter (HOSPITAL_COMMUNITY): Payer: Self-pay | Admitting: Clinical

## 2023-11-08 DIAGNOSIS — F411 Generalized anxiety disorder: Secondary | ICD-10-CM

## 2023-11-08 DIAGNOSIS — F3181 Bipolar II disorder: Secondary | ICD-10-CM | POA: Diagnosis not present

## 2023-11-08 DIAGNOSIS — F603 Borderline personality disorder: Secondary | ICD-10-CM | POA: Diagnosis not present
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
## 2023-11-09 ENCOUNTER — Encounter (HOSPITAL_COMMUNITY): Admitting: Psychiatry

## 2023-11-09 NOTE — Progress Notes (Signed)
 This encounter was created in error - please disregard.  Patient did not show up for the appointment. Reminder links were sent. Attempted to call the patient but received no response.

## 2023-11-26 ENCOUNTER — Other Ambulatory Visit: Payer: Self-pay

## 2023-11-26 ENCOUNTER — Ambulatory Visit
Admission: RE | Admit: 2023-11-26 | Discharge: 2023-11-26 | Disposition: A | Discharge status: Home / Self Care | Source: Ambulatory Visit | Attending: Family Medicine | Admitting: Family Medicine

## 2023-11-26 VITALS — BP 120/79 | HR 67 | Temp 98.7°F | Resp 16

## 2023-11-26 DIAGNOSIS — N760 Acute vaginitis: Secondary | ICD-10-CM | POA: Insufficient documentation

## 2023-11-26 MED ORDER — FLUCONAZOLE 150 MG PO TABS: 150.0000 mg | ORAL_TABLET | Freq: Every day | ORAL | 0 refills | Status: DC

## 2023-11-26 MED ORDER — METRONIDAZOLE 500 MG PO TABS: 500.0000 mg | ORAL_TABLET | Freq: Two times a day (BID) | ORAL | 0 refills | Status: DC

## 2023-11-26 NOTE — ED Triage Notes (Signed)
 C/o vaginal discharge since menstrual period ended on 29th. Discharge is white, some odor present. Dry feeling but not itching. No otc meds.

## 2023-11-26 NOTE — Discharge Instructions (Signed)
Check my chart for test results 
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
## 2023-11-26 NOTE — ED Provider Notes (Signed)
 [facility]    CSN: [id number] Arrival date & time: 11/26/23  1811      History   Chief Complaint Chief Complaint  Patient presents with   Vaginal Discharge    HPI Kimberly Harper is a 38 y.o. female.   HPI Patient has had recurring vaginal infections.  This is documented in a chart review.  She states she has a stable sexual partner.  He has never been treated.  She states she currently has a vaginal discharge and odor.  She feels confident that she has BV again.  May be yeast.  She would like treatment for both She has changed the products that she uses.  Wears cotton underwear.  Does not use fragrance.  Is frustrated that the recurrence is Past Medical History:  Diagnosis Date   Alcoholic (HCC)    Anemia    Phreesia 01/17/2020   Anxiety    Asthma    Asthma 06/22/2021   Bipolar 1 disorder (HCC)    Blood transfusion without reported diagnosis    Phreesia 01/17/2020   Chest pain 03/01/2022   03/2021 xr negative.   Chronic headaches    Depression    Phreesia 01/17/2020   Lactose intolerance 03/01/2022   Major depressive disorder    Nausea 03/01/2022   Pregnancy, location unknown 11/21/2021   Formatting of this note might be different from the original. [facility] Quant Book Walker Gherardi Language: English There is no such number on file (mobile). [phone] (home) [address] [place] [address] No e-mail address on record  [facility] (7th floor [facility], Parking De[address]): M-F 7:30-18:00 [facility]: M-F 8:00-17:30 [facility]: M-F 8:30-17:00 (cl   Substance abuse (HCC)    Phreesia 01/17/2020    Patient Active Problem List   Diagnosis Date Noted   Chest injuries 07/20/2022   Vaginal pain 06/13/2022   Abnormal uterine bleeding (AUB) 03/13/2022   Nausea 03/01/2022   Chest pain 03/01/2022   Lactose intolerance 03/01/2022   Rash 03/01/2022   Epigastric pain 02/08/2022   Other allergic rhinitis 06/22/2021   Heartburn  06/22/2021   Panic disorder 04/12/2020   GAD (generalized anxiety disorder) 03/18/2020   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   Borderline personality disorder (HCC) 02/06/2020   Bipolar 2 disorder, major depressive episode (HCC) 12/06/2019    Past Surgical History:  Procedure Laterality Date   CESAREAN SECTION N/A    Phreesia 01/17/2020   COLONOSCOPY     earlier in year 2022   DILATION AND CURETTAGE OF UTERUS     HAND SURGERY Right    WISDOM TOOTH EXTRACTION      OB History     Gravida  4   Para  1   Term      Preterm  1   AB  3   Living  1      SAB  3   IAB      Ectopic      Multiple      Live Births  1            Home Medications    Prior to Admission medications   Medication Sig Start Date End Date Taking? Authorizing Provider  fluconazole  (DIFLUCAN ) 150 MG tablet Take 1 tablet (150 mg total) by mouth daily. Repeat in 1 week if needed 11/26/23  Yes Stephany Ehrich, MD  metroNIDAZOLE  (FLAGYL ) 500 MG tablet Take 1 tablet (500 mg total) by mouth 2 (  two) times daily. 11/26/23  Yes Stephany Ehrich, MD  albuterol  (VENTOLIN  HFA) 108 (90 Base) MCG/ACT inhaler Inhale 1-2 puffs into the lungs every 6 (six) hours as needed for wheezing or shortness of breath. 06/08/22   Adolph Hoop, PA-C  fluticasone  (FLONASE ) 50 MCG/ACT nasal spray Place 1 spray into both nostrils 2 (two) times daily. 04/03/23   Corbin Dess, PA-C  pantoprazole  (PROTONIX ) 40 MG tablet TAKE 1 TABLET(40 MG) BY MOUTH DAILY 01/14/23   Anthon Kins, MD  pramipexole  (MIRAPEX ) 0.25 MG tablet TAKE 1 TABLET(0.25 MG) BY MOUTH THREE TIMES DAILY 09/27/23   Yves Herb, MD  propranolol  (INDERAL ) 10 MG tablet Take 1 tablet (10 mg total) by mouth 2 (two) times daily as needed. for anxiety 09/27/23   Yves Herb, MD  propranolol  (INDERAL ) 20 MG tablet TAKE 1 TABLET(20 MG) BY MOUTH DAILY 09/27/23   Yves Herb, MD    Family History Family History  Problem Relation Age of Onset    Hypertension Mother    Diabetes Mother    Stroke Mother    Depression Mother    Depression Sister    Irritable bowel syndrome Sister    Asthma Brother    Allergic rhinitis Brother    Migraines Brother    Depression Brother    Stroke Maternal Grandmother    Diabetes Maternal Grandmother    Alcohol abuse Maternal Grandfather    Stroke Paternal Grandmother    Breast cancer Paternal Grandmother    Alcohol abuse Paternal Grandfather    Lupus Half-Sister    Angioedema Neg Hx    Eczema Neg Hx    Immunodeficiency Neg Hx    Atopy Neg Hx     Social History Social History   Tobacco Use   Smoking status: Former    Current packs/day: 0.00    Types: Cigarettes    Quit date: 09/07/2006    Years since quitting: 17.2   Smokeless tobacco: Never  Vaping Use   Vaping status: Never Used  Substance Use Topics   Alcohol use: Yes    Comment: occ   Drug use: Not Currently     Allergies   Neosporin [bacitracin-polymyxin b], Multiple vitamins-iron, Mupirocin, and Prednisone   Review of Systems Review of Systems See HPI  Physical Exam Triage Vital Signs ED Triage Vitals  Encounter Vitals Group     BP 11/26/23 1816 120/79     Systolic BP Percentile --      Diastolic BP Percentile --      Pulse Rate 11/26/23 1816 67     Resp 11/26/23 1816 16     Temp 11/26/23 1816 98.7 F (37.1 C)     Temp src --      SpO2 11/26/23 1816 99 %     Weight --      Height --      Head Circumference --      Peak Flow --      Pain Score 11/26/23 1819 0     Pain Loc --      Pain Education --      Exclude from Growth Chart --    No data found.  Updated Vital Signs BP 120/79   Pulse 67   Temp 98.7 F (37.1 C)   Resp 16   LMP 11/12/2023 (Exact Date)   SpO2 99%        Physical Exam Constitutional:      General: She is not in acute distress.  Appearance: She is well-developed.  HENT:     Head: Normocephalic and atraumatic.  Eyes:     Conjunctiva/sclera: Conjunctivae normal.      Pupils: Pupils are equal, round, and reactive to light.  Cardiovascular:     Rate and Rhythm: Normal rate.  Pulmonary:     Effort: Pulmonary effort is normal. No respiratory distress.  Genitourinary:    Comments: Vaginal swab obtained Musculoskeletal:        General: Normal range of motion.     Cervical back: Normal range of motion.  Skin:    General: Skin is warm and dry.  Neurological:     Mental Status: She is alert.      UC Treatments / Results  Labs (all labs ordered are listed, but only abnormal results are displayed) Labs Reviewed  CERVICOVAGINAL ANCILLARY ONLY    EKG   Radiology No results found.  Procedures Procedures (including critical care time)  Medications Ordered in UC Medications - No data to display  Initial Impression / Assessment and Plan / UC Course  I have reviewed the triage vital signs and the nursing notes.  Pertinent labs & imaging results that were available during my care of the patient were reviewed by me and considered in my medical decision making (see chart for details).     Final Clinical Impressions(s) / UC Diagnoses   Final diagnoses:  Acute vaginitis  Recurrent vaginitis     Discharge Instructions      Check my chart for test results   ED Prescriptions     Medication Sig Dispense Auth. Provider   metroNIDAZOLE  (FLAGYL ) 500 MG tablet Take 1 tablet (500 mg total) by mouth 2 (two) times daily. 14 tablet Stephany Ehrich, MD   fluconazole  (DIFLUCAN ) 150 MG tablet Take 1 tablet (150 mg total) by mouth daily. Repeat in 1 week if needed 2 tablet Stephany Ehrich, MD      PDMP not reviewed this encounter.   Stephany Ehrich, MD 11/26/23 [phone]

## 2023-11-27 ENCOUNTER — Inpatient Hospital Stay: Admission: RE | Admit: 2023-11-27 | Source: Ambulatory Visit

## 2023-11-27 ENCOUNTER — Telehealth (HOSPITAL_BASED_OUTPATIENT_CLINIC_OR_DEPARTMENT_OTHER): Admitting: Psychiatry

## 2023-11-27 ENCOUNTER — Telehealth: Payer: Self-pay

## 2023-11-27 DIAGNOSIS — F3181 Bipolar II disorder: Secondary | ICD-10-CM | POA: Diagnosis not present

## 2023-11-27 DIAGNOSIS — F603 Borderline personality disorder: Secondary | ICD-10-CM | POA: Diagnosis not present

## 2023-11-27 DIAGNOSIS — F411 Generalized anxiety disorder: Secondary | ICD-10-CM | POA: Diagnosis not present

## 2023-11-27 DIAGNOSIS — F410 Panic disorder [episodic paroxysmal anxiety] without agoraphobia: Secondary | ICD-10-CM

## 2023-11-27 MED ORDER — HYDROXYZINE HCL 10 MG PO TABS
10.0000 mg | ORAL_TABLET | Freq: Every day | ORAL | 2 refills | Status: DC | PRN
Start: 2023-11-27 — End: 2024-02-05

## 2023-11-27 MED ORDER — PROPRANOLOL HCL 20 MG PO TABS: ORAL_TABLET | ORAL | 0 refills | Status: DC

## 2023-11-28 ENCOUNTER — Ambulatory Visit (HOSPITAL_COMMUNITY): Payer: Medicare Other | Admitting: Clinical

## 2023-11-28 ENCOUNTER — Encounter (HOSPITAL_COMMUNITY): Payer: Self-pay | Admitting: Psychiatry

## 2023-11-28 LAB — CERVICOVAGINAL ANCILLARY ONLY
Bacterial Vaginitis (gardnerella): POSITIVE — AB
Candida Glabrata: NEGATIVE
Candida Vaginitis: NEGATIVE
Chlamydia: NEGATIVE
Comment: NEGATIVE
Comment: NEGATIVE
Comment: NEGATIVE
Comment: NEGATIVE
Comment: NEGATIVE
Comment: NORMAL
Neisseria Gonorrhea: NEGATIVE
Trichomonas: NEGATIVE

## 2023-12-13 ENCOUNTER — Ambulatory Visit (HOSPITAL_COMMUNITY): Payer: Medicare Other | Admitting: Clinical

## 2023-12-27 ENCOUNTER — Encounter (HOSPITAL_COMMUNITY): Payer: Self-pay | Admitting: Clinical

## 2023-12-27 ENCOUNTER — Ambulatory Visit (HOSPITAL_COMMUNITY): Payer: Medicare Other | Admitting: Clinical

## 2023-12-27 DIAGNOSIS — F603 Borderline personality disorder: Secondary | ICD-10-CM | POA: Diagnosis not present

## 2023-12-27 DIAGNOSIS — F3181 Bipolar II disorder: Secondary | ICD-10-CM | POA: Diagnosis not present

## 2023-12-27 DIAGNOSIS — F411 Generalized anxiety disorder: Secondary | ICD-10-CM
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
## 2024-01-03 ENCOUNTER — Telehealth (HOSPITAL_BASED_OUTPATIENT_CLINIC_OR_DEPARTMENT_OTHER): Admitting: Psychiatry

## 2024-01-03 ENCOUNTER — Encounter (HOSPITAL_COMMUNITY): Payer: Self-pay | Admitting: Psychiatry

## 2024-01-03 DIAGNOSIS — F3181 Bipolar II disorder: Secondary | ICD-10-CM | POA: Diagnosis not present

## 2024-01-03 DIAGNOSIS — F603 Borderline personality disorder: Secondary | ICD-10-CM

## 2024-01-03 DIAGNOSIS — F411 Generalized anxiety disorder: Secondary | ICD-10-CM | POA: Diagnosis not present

## 2024-01-17 ENCOUNTER — Ambulatory Visit (INDEPENDENT_AMBULATORY_CARE_PROVIDER_SITE_OTHER): Admitting: Clinical

## 2024-01-17 ENCOUNTER — Encounter (HOSPITAL_COMMUNITY): Payer: Self-pay | Admitting: Clinical

## 2024-01-17 DIAGNOSIS — F603 Borderline personality disorder: Secondary | ICD-10-CM | POA: Diagnosis not present

## 2024-01-17 DIAGNOSIS — F3181 Bipolar II disorder: Secondary | ICD-10-CM | POA: Diagnosis not present

## 2024-01-17 DIAGNOSIS — F411 Generalized anxiety disorder: Secondary | ICD-10-CM | POA: Diagnosis not present
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
## 2024-01-31 ENCOUNTER — Ambulatory Visit (HOSPITAL_COMMUNITY): Admitting: Clinical

## 2024-02-04 NOTE — Progress Notes (Unsigned)
 BH MD/PA/NP OP Progress Note  02/05/2024 8:07 PM Kimberly Harper  MRN:  [id number]  Visit Diagnosis:    ICD-10-CM   1. Bipolar 2 disorder, major depressive episode (HCC)  F31.81     2. Generalized anxiety disorder  F41.1 pramipexole  (MIRAPEX ) 0.25 MG tablet    hydrOXYzine  (ATARAX ) 10 MG tablet    3. Borderline personality disorder (HCC)  F60.3      Assessment: Kimberly Harper is a 38 y.o. female with a history of borderline personality disorder, Bipolar 2 disorder, PTSD,  GAD, and panic disorder who presents virtually to [facility] for initial evaluation on 09/12/2022.  At initial evaluation patient reported symptoms of increased anxiety, fatigue, tearfulness, negative self thoughts, worthlessness, nonsuicidal self-injury, and intermittent SI without intent or plan.  Safety planning was discussed as well as alternative means to try manage nonsuicidal self-injury.  Patient had a significant past history of bipolar 2 disorder endorsed episodes of hypomania in the past which can include decreased sleep, increased energy, reckless and impulsive behaviors, and excessively elevated mood.  These episodes can last 1 to 3 days.  She denied any episodes of full mania.  Of note patient was also screened for borderline personality disorder which she has been diagnosed with in the past and did endorse real or perceived feelings of abandonment, chronic feelings of emptiness, splitting, impulsive behaviors, difficulty with interpersonal relationships, poor sense of self, and NSSI.  Patient met criteria for bipolar 2 disorder, borderline personality disorder, and generalized anxiety disorder.  While she does have a significant past history of trauma and some traits of PTSD she does not have enough to meet criteria.  Treatment options were discussed however patient notes trying a number of medications in the past with poor or sometimes negative effect including  nausea, excessive sedation, or increased suicidality.  She was not interested in starting a mood stabilizer at this time.  Due to the bipolar 2 diagnosis we would not start an antidepressant medication without some mood stabilization.  Patient was open to as needed medication for anxiety.   [name] presents for follow-up evaluation. Today, 02/05/24, patient reports struggling with continued nervous symptoms of depression.  There still is the strong component of ongoing psychosocial stressors including the relationship with her husband, her mother's health and recent hospitalization, and her unstable living situation.  Patient still struggles with consistent medication compliance.  She has gotten a pill container and started to write down when she takes her medications.  The instability in her current housing however has resulted in patient's forgetting to bring her continue with her place to place.  Encouraged medication compliance and discussed strategies to promote better adherence.  We will continue on current regimen and follow up in 6 weeks.  Psychotherapeutic interventions were used during today's session. From 3:13 PM to 3:29 PM. Therapeutic interventions included empathic listening, supportive therapy, cognitive and behavioral therapy, motivational interviewing. Used supportive interviewing techniques to provide emotional validation. Worked on cognitive reframing techniques and unhelpful thoughts challenged as appropriate. Alternative thoughts developed with guidance. Reviewed some techniques to facilitate increased behavioral activation. Improvement was evidenced by patient's participation and identified commitment to therapy goals.  Plan: - Continue pramipexole  to 0.25 mg QD due to nausea  - Continue Phenergan  5 mg Q8H prn for nausea, recommend f/u with PCP for continued management - Continue Propranolol  20 mg QD for migraines - Continue Atarax  10 mg daily prn   - CMP, Beta HCG  reviewed - Continue therapy with Elgie Crest - Recommend DBT - Crisis resources reviewed - Follow up in a month  Chief Complaint:  Chief Complaint  Patient presents with   Follow-up   HPI: Kimberly Harper presents for follow up reporting that she is alive.  She has been dealing with ongoing stress and symptoms of depression.  Her mother is back in the hospital and it seems pretty bad. Her mother has an infection and her counts have been low. They have her antibiotics now while the try to determine if there is a particular infection. When her mother first came to the hospital she was very confused and delirious. She has not been delirious the last couple days but now she is weak, fatigued, and not talking much. Kimberly Harper has been staying with her the past couple days and splitting with her sister. But her sister has just gotten injured and [name] is unsure if more of the burden of support will fall on her. 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 Empathic listening techniques were used and support was provided.  The things with her ex are ongoing. Right now she is not focusing on that relationship or situation.  That said he has been a bit more supportive with patient's mom being in the hospital.  Kimberly Harper is uncertain if this is because he has a secondary ulterior motive.  Patient notes that she has still been having trouble remembering to take her medications. She has a book to track when she takes it as well as a pill container.  She can forget to bring the pill container with her though and can frequently go a day or 2 without taking medications.  Past Psychiatric History: Patient had seen Dr. Lynnetta in the past and the [facility] more recently. She reports that she has been hospitalized multiple times including once in [place] , several times in [place], and twice at [facility].   She has been through the [facility] in 2021 and again in 2023.  Has used Abilify , Seroquel  (weight gain), Geodon, Latuda, Zoloft , Wellbutrin , Remeron , Celexa, trazodone , trintellix , BuSpar , propranolol , gabapentin , Atarax ,  Prazosin , Lamictal, lithium, Xanax  (benefit, but memory issues). A lot of these made her too nauseas or too sleepy.  Reports having 2-4 alcoholic drinks a couple times a month. Alsoe endorsed marijuana use in the past. Last in 2008. She denies any other substance use history.   Past Medical History:  Past Medical History:  Diagnosis Date   Alcoholic (HCC)    Anemia    Phreesia 01/17/2020   Anxiety    Asthma    Asthma 06/22/2021   Bipolar 1 disorder (HCC)    Blood transfusion without reported diagnosis    Phreesia 01/17/2020   Chest pain 03/01/2022   03/2021 xr negative.   Chronic headaches    Depression    Phreesia 01/17/2020   Lactose intolerance 03/01/2022   Major depressive disorder    Nausea 03/01/2022   Pregnancy, location unknown 11/21/2021   Formatting of this note might be different from the original. [facility] Quant Book Starkeisha Merkey Language: English There is no such number on file (mobile). [phone] (home) [address] [place] [address] No e-mail address on record  Lab [facility] [address] (7th floor [place], [address]): M-F 7:30-18:00 [facility]: M-F 8:00-17:30 [facility]: M-F 8:30-17:00 (cl   Substance  abuse (HCC)    Phreesia 01/17/2020    Past Surgical History:  Procedure Laterality Date   CESAREAN SECTION N/A    Phreesia 01/17/2020   COLONOSCOPY     earlier in year 2022   DILATION AND CURETTAGE OF UTERUS     HAND SURGERY Right    WISDOM TOOTH EXTRACTION     Family History:  Family History  Problem Relation Age of Onset   Hypertension Mother    Diabetes Mother    Stroke Mother    Depression Mother    Depression Sister    Irritable bowel syndrome Sister    Asthma Brother     Allergic rhinitis Brother    Migraines Brother    Depression Brother    Stroke Maternal Grandmother    Diabetes Maternal Grandmother    Alcohol abuse Maternal Grandfather    Stroke Paternal Grandmother    Breast cancer Paternal Grandmother    Alcohol abuse Paternal Grandfather    Lupus Half-Sister    Angioedema Neg Hx    Eczema Neg Hx    Immunodeficiency Neg Hx    Atopy Neg Hx     Social History:  Social History   Socioeconomic History   Marital status: Single    Spouse name: Not on file   Number of children: 1   Years of education: Not on file   Highest education level: Not on file  Occupational History   Occupation: Academic librarian  Tobacco Use   Smoking status: Former    Current packs/day: 0.00    Types: Cigarettes    Quit date: 09/07/2006    Years since quitting: 17.4   Smokeless tobacco: Never  Vaping Use   Vaping status: Never Used  Substance and Sexual Activity   Alcohol use: Yes    Comment: occ   Drug use: Not Currently   Sexual activity: Yes    Birth control/protection: None  Other Topics Concern   Not on file  Social History Narrative   Right handed   Social Drivers of Health   Financial Resource Strain: Not on file  Food Insecurity: Not on file  Transportation Needs: Not on file  Physical Activity: Not on file  Stress: Not on file  Social Connections: Unknown (11/21/2021)   [facility]   Social Network    Social Network: Not on file    Allergies:  Allergies  Allergen Reactions   Neosporin [Bacitracin-Polymyxin B] Itching   Multiple Vitamins-Iron Swelling    States that vitamins, folic acid  and iron have caused swelling of tongue and gums   Mupirocin Rash   Prednisone Other (See Comments)    Altered mental status    Current Medications: Current Outpatient Medications  Medication Sig Dispense Refill   albuterol  (VENTOLIN  HFA) 108 (90 Base) MCG/ACT inhaler Inhale 1-2 puffs into the lungs every 6 (six) hours as needed for  wheezing or shortness of breath. 18 g 0   fluconazole  (DIFLUCAN ) 150 MG tablet Take 1 tablet (150 mg total) by mouth daily. Repeat in 1 week if needed 2 tablet 0   fluticasone  (FLONASE ) 50 MCG/ACT nasal spray Place 1 spray into both nostrils 2 (two) times daily. 16 g 2   hydrOXYzine  (ATARAX ) 10 MG tablet Take 1 tablet (10 mg total) by mouth daily as needed. 30 tablet 2   metroNIDAZOLE  (FLAGYL ) 500 MG tablet Take 1 tablet (500 mg total) by mouth 2 (two) times daily. 14 tablet 0   pantoprazole  (PROTONIX ) 40 MG tablet TAKE  1 TABLET(40 MG) BY MOUTH DAILY 90 tablet 1   pramipexole  (MIRAPEX ) 0.25 MG tablet TAKE 1 TABLET(0.25 MG) BY MOUTH THREE TIMES DAILY 270 tablet 0   propranolol  (INDERAL ) 20 MG tablet TAKE 1 TABLET(20 MG) BY MOUTH DAILY 90 tablet 0   No current facility-administered medications for this visit.     Psychiatric Specialty Exam: Review of Systems  There were no vitals taken for this visit.There is no height or weight on file to calculate BMI.  General Appearance: Fairly Groomed  Eye Contact:  Good  Speech:  Clear and Coherent  Volume:  Normal  Mood:  Depressed and Euthymic  Affect:  Labile and Tearful  Thought Process:  Coherent  Orientation:  Full (Time, Place, and Person)  Thought Content: Logical   Suicidal Thoughts:  No  Homicidal Thoughts:  No  Memory:  Immediate;   Good  Judgement:  Fair  Insight:  Lacking  Psychomotor Activity:  Normal  Concentration:  Concentration: Fair  Recall:  Fair  Fund of Knowledge: Fair  Language: Good  Akathisia:  NA    AIMS (if indicated): not done  Assets:  Communication Skills Desire for Improvement Housing Talents/Skills Transportation Vocational/Educational  ADL's:  Intact  Cognition: WNL  Sleep:  Fair   Metabolic Disorder Labs: Lab Results  Component Value Date   HGBA1C 5.3 01/19/2020   MPG 105 12/07/2019   No results found for: PROLACTIN Lab Results  Component Value Date   CHOL 189 01/19/2020   TRIG 109  01/19/2020   HDL 47 01/19/2020   CHOLHDL 4.0 01/19/2020   VLDL 9 12/07/2019   LDLCALC 122 (H) 01/19/2020   LDLCALC 141 (H) 12/07/2019   Lab Results  Component Value Date   TSH 1.16 04/12/2021   TSH 0.50 01/20/2021    Therapeutic Level Labs: No results found for: LITHIUM No results found for: VALPROATE No results found for: CBMZ   Screenings: AIMS    Flowsheet Row [facility]  AIMS Total Score 0   AUDIT    Flowsheet Row Admission (Discharged) from [facility]  Alcohol Use Disorder Identification Test Final Score (AUDIT) 1   GAD-7    Flowsheet Row Counselor from 08/24/2023 in [facility] Visit from 12/30/2020 in [facility] Visit from 01/19/2020 in Primary Care at [facility]  Total GAD-7 Score 18 16 10    PHQ2-9    Flowsheet Row Counselor from 08/24/2023 in [facility] from 08/17/2022 in [facility] Visit from 03/01/2022 in [facility] from 08/01/2021 in [facility]AVIORAL [facility] INTENSIVE PSYCH Office Visit from 04/12/2021 in [facility] Total Score 4 2 0 4 1  PHQ-9 Total Score 16 13 -- 20 8   Flowsheet Row UC from 11/26/2023 in [facility] [place] [facility] from 10/08/2023 in [facility] at [place] UC from 09/18/2023 in [facility] at [facility] RISK CATEGORY No Risk Error: Question 6 not populated No Risk    Collaboration of Care: Collaboration of Care: Medication Management AEB medication prescription and Referral or follow-up with counselor/therapist AEB chart review  Patient/Guardian was advised  Release of Information must be obtained prior to any record release  in order to collaborate their care with an outside provider. Patient/Guardian was advised if they have not already done so to contact the registration department to sign all necessary forms in order for us  to release information regarding their care.   Consent: Patient/Guardian gives verbal consent for treatment and assignment of benefits for services provided during this visit. Patient/Guardian expressed understanding and agreed to proceed.    Arvella CHRISTELLA Finder, MD 02/05/2024, 8:07 PM   Virtual Visit via Video Note  I connected with Rainelle Coe on 02/05/24 at  3:00 PM EDT by a video enabled telemedicine application and verified that I am speaking with the correct person using two identifiers.  Location: Patient: Home Provider: Home Office   I discussed the limitations of evaluation and management by telemedicine and the availability of in person appointments. The patient expressed understanding and agreed to proceed.   I discussed the assessment and treatment plan with the patient. The patient was provided an opportunity to ask questions and all were answered. The patient agreed with the plan and demonstrated an understanding of the instructions.   The patient was advised to call back or seek an in-person evaluation if the symptoms worsen or if the condition fails to improve as anticipated.  I provided 30 minutes of non-face-to-face time during this encounter.   Arvella CHRISTELLA Finder, MD

## 2024-02-05 ENCOUNTER — Telehealth (HOSPITAL_BASED_OUTPATIENT_CLINIC_OR_DEPARTMENT_OTHER): Admitting: Psychiatry

## 2024-02-05 ENCOUNTER — Encounter (HOSPITAL_COMMUNITY): Payer: Self-pay | Admitting: Psychiatry

## 2024-02-05 DIAGNOSIS — F3181 Bipolar II disorder: Secondary | ICD-10-CM | POA: Diagnosis not present

## 2024-02-05 DIAGNOSIS — F411 Generalized anxiety disorder: Secondary | ICD-10-CM

## 2024-02-05 DIAGNOSIS — F603 Borderline personality disorder: Secondary | ICD-10-CM

## 2024-02-05 MED ORDER — HYDROXYZINE HCL 10 MG PO TABS: 10.0000 mg | ORAL_TABLET | Freq: Every day | ORAL | 2 refills | Status: DC | PRN

## 2024-02-05 MED ORDER — PRAMIPEXOLE DIHYDROCHLORIDE 0.25 MG PO TABS: ORAL_TABLET | ORAL | 0 refills | Status: DC

## 2024-02-14 ENCOUNTER — Encounter (HOSPITAL_COMMUNITY): Payer: Self-pay | Admitting: Clinical

## 2024-02-14 ENCOUNTER — Ambulatory Visit (INDEPENDENT_AMBULATORY_CARE_PROVIDER_SITE_OTHER): Admitting: Clinical

## 2024-02-14 DIAGNOSIS — F3181 Bipolar II disorder: Secondary | ICD-10-CM | POA: Diagnosis not present

## 2024-02-14 DIAGNOSIS — F603 Borderline personality disorder: Secondary | ICD-10-CM | POA: Diagnosis not present

## 2024-02-14 DIAGNOSIS — F411 Generalized anxiety disorder: Secondary | ICD-10-CM | POA: Diagnosis not present
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
## 2024-02-19 ENCOUNTER — Ambulatory Visit

## 2024-02-20 ENCOUNTER — Ambulatory Visit
Admission: RE | Admit: 2024-02-20 | Discharge: 2024-02-20 | Disposition: A | Discharge status: Home / Self Care | Source: Ambulatory Visit | Attending: Family Medicine | Admitting: Family Medicine

## 2024-02-20 VITALS — BP 111/62 | HR 66 | Temp 99.4°F | Resp 19

## 2024-02-20 DIAGNOSIS — R21 Rash and other nonspecific skin eruption: Secondary | ICD-10-CM | POA: Diagnosis not present

## 2024-02-20 DIAGNOSIS — T7840XA Allergy, unspecified, initial encounter: Secondary | ICD-10-CM | POA: Diagnosis not present

## 2024-02-20 MED ORDER — FEXOFENADINE HCL 180 MG PO TABS
180.0000 mg | ORAL_TABLET | Freq: Every day | ORAL | 0 refills | Status: DC
Start: 2024-02-20 — End: 2024-05-20

## 2024-02-20 NOTE — ED Provider Notes (Signed)
 Kimberly Harper    CSN: [id number] Arrival date & time: 02/20/24  1640      History   Chief Complaint Chief Complaint  Patient presents with   Rash    Rash after taking vitamin - Entered by patient    HPI Kimberly Harper is a 38 y.o. female.   HPI 38 year old female presents with a rash after taking vitamin.  PMH significant for alcoholic, bipolar 1 disorder, and asthma.  Past Medical History:  Diagnosis Date   Alcoholic (HCC)    Anemia    [facility]reesia 01/17/2020   Anxiety    Asthma    Asthma 06/22/2021   Bipolar 1 disorder (HCC)    Blood transfusion without reported diagnosis    [facility]ia 01/17/2020   Chest pain 03/01/2022   03/2021 xr negative.   Chronic headaches    Depression    [facility]ia 01/17/2020   Lactose intolerance 03/01/2022   Major depressive disorder    Nausea 03/01/2022   Pregnancy, location unknown 11/21/2021   Formatting of this note might be different from the original. [facility] Quant Book Jemimah Delia Language: English There is no such number on file (mobile). [phone] (home) [address] [place] [address] No e-mail address on record  [facility] (7th floor [place], [address]): M-F 7:30-18:00 [facility]: M-F 8:00-17:30 [facility]: M-F 8:30-17:00 (cl   Substance abuse (HCC)    [facility]reesia 01/17/2020    Patient Active Problem List   Diagnosis Date Noted   Chest injuries 07/20/2022   Vaginal pain 06/13/2022   Abnormal uterine bleeding (AUB) 03/13/2022   Nausea 03/01/2022   Chest pain 03/01/2022   Lactose intolerance 03/01/2022   Rash 03/01/2022   Epigastric pain 02/08/2022   Other allergic rhinitis 06/22/2021   Heartburn 06/22/2021   Panic disorder 04/12/2020   GAD (generalized anxiety disorder) 03/18/2020   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   Borderline personality disorder (HCC) 02/06/2020   Bipolar 2 disorder, major depressive episode (HCC) 12/06/2019    Past Surgical History:   Procedure Laterality Date   CESAREAN SECTION N/A    Phreesia 01/17/2020   COLONOSCOPY     earlier in year 2022   DILATION AND CURETTAGE OF UTERUS     HAND SURGERY Right    WISDOM TOOTH EXTRACTION      OB History     Gravida  5   Para  1   Term      Preterm  1   AB  3   Living  1      SAB  3   IAB      Ectopic      Multiple      Live Births  1        Obstetric Comments  LMP 01/13/2024          Home Medications    Prior to Admission medications   Medication Sig Start Date End Date Taking? Authorizing Provider  fexofenadine  (ALLEGRA  ALLERGY) 180 MG tablet Take 1 tablet (180 mg total) by mouth daily for 15 days. 02/20/24 03/06/24 Yes Manisha Cancel, FNP  albuterol  (VENTOLIN  HFA) 108 (90 Base) MCG/ACT inhaler Inhale 1-2 puffs into the lungs every 6 (six) hours as needed for wheezing or shortness of breath. 06/08/22   Christopher Savannah, PA-C  fluconazole  (DIFLUCAN ) 150 MG tablet Take 1 tablet (150 mg total) by mouth daily. Repeat in 1 week if needed 11/26/23   Maranda Jamee [name], MD  fluticasone  (FLONASE ) 50 MCG/ACT nasal spray Place 1 spray into both nostrils 2 (two) times daily. 04/03/23   Stuart Vernell Norris, PA-C  hydrOXYzine  (ATARAX ) 10 MG tablet Take 1 tablet (10 mg total) by mouth daily as needed. 02/05/24   Carvin Arvella HERO, MD  metroNIDAZOLE  (FLAGYL ) 500 MG tablet Take 1 tablet (500 mg total) by mouth 2 (two) times daily. 11/26/23   Maranda Jamee [name], MD  pantoprazole  (PROTONIX ) 40 MG tablet TAKE 1 TABLET(40 MG) BY MOUTH DAILY 01/14/23   Jesus Bernardino MATSU, MD  pramipexole  (MIRAPEX ) 0.25 MG tablet TAKE 1 TABLET(0.25 MG) BY MOUTH THREE TIMES DAILY 02/05/24   Carvin Arvella HERO, MD  propranolol  (INDERAL ) 20 MG tablet TAKE 1 TABLET(20 MG) BY MOUTH DAILY 11/27/23   Carvin Arvella HERO, MD    Family History Family History  Problem Relation Age of Onset   Hypertension Mother    Diabetes Mother    Stroke Mother    Depression Mother    Depression Sister    Irritable bowel  syndrome Sister    Asthma Brother    Allergic rhinitis Brother    Migraines Brother    Depression Brother    Stroke Maternal Grandmother    Diabetes Maternal Grandmother    Alcohol abuse Maternal Grandfather    Stroke Paternal Grandmother    Breast cancer Paternal Grandmother    Alcohol abuse Paternal Grandfather    Lupus Half-Sister    Angioedema Neg Hx    Eczema Neg Hx    Immunodeficiency Neg Hx    Atopy Neg Hx     Social History Social History   Tobacco Use   Smoking status: Former    Current packs/day: 0.00    Types: Cigarettes    Quit date: 09/07/2006    Years since quitting: 17.4   Smokeless tobacco: Never  Vaping Use   Vaping status: Never Used  Substance Use Topics   Alcohol use: Yes    Comment: occ   Drug use: Not Currently     Allergies   Neosporin [bacitracin-polymyxin b], Multiple vitamins-iron, Mupirocin, and Prednisone   Review of Systems Review of Systems  Skin:  Positive for rash.  All other systems reviewed and are negative.    Physical Exam Triage Vital Signs ED Triage Vitals  Encounter Vitals Group     BP      Girls Systolic BP Percentile      Girls Diastolic BP Percentile      Boys Systolic BP Percentile      Boys Diastolic BP Percentile      Pulse      Resp      Temp      Temp src      SpO2      Weight      Height      Head Circumference      Peak Flow      Pain Score      Pain Loc      Pain Education      Exclude from Growth Chart    No data found.  Updated Vital Signs BP 111/62   Pulse 66   Temp 99.4 F (37.4 C)   Resp 19   LMP 01/13/2024 (Exact Date)   SpO2 98%    Physical Exam Vitals and nursing note reviewed.  Constitutional:      Appearance: Normal appearance. She is normal weight.  HENT:     Head: Normocephalic and atraumatic.     Mouth/Throat:  Mouth: Mucous membranes are moist.     Pharynx: Oropharynx is clear.  Eyes:     Extraocular Movements: Extraocular movements intact.      Conjunctiva/sclera: Conjunctivae normal.     Pupils: Pupils are equal, round, and reactive to light.  Cardiovascular:     Rate and Rhythm: Normal rate and regular rhythm.     Pulses: Normal pulses.     Heart sounds: Normal heart sounds.  Pulmonary:     Effort: Pulmonary effort is normal.     Breath sounds: Normal breath sounds. No wheezing or rales.  Musculoskeletal:        General: Normal range of motion.  Skin:    General: Skin is warm and dry.  Neurological:     General: No focal deficit present.     Mental Status: She is alert and oriented to person, place, and time. Mental status is at baseline.  Psychiatric:        Mood and Affect: Mood normal.        Behavior: Behavior normal.      UC Treatments / Results  Labs (all labs ordered are listed, but only abnormal results are displayed) Labs Reviewed - No data to display  EKG   Radiology No results found.  Procedures Procedures (including critical Harper time)  Medications Ordered in UC Medications - No data to display  Initial Impression / Assessment and Plan / UC Course  I have reviewed the triage vital signs and the nursing notes.  Pertinent labs & imaging results that were available during my Harper of the patient were reviewed by me and considered in my medical decision making (see chart for details).     MDM: 1.  Allergic reaction, initial encounter-Rx'd Allegra  180 mg tablet: Take 1 tablet daily for 3 to 5 days; 2.  Rash and nonspecific skin eruption-Rx'd Allegra  180 mg tablet: Take 1 tablet for 3 to 5 days. Advised patient medication daily for the next 3 to 5 days for allergic reaction rash.  Encouraged to increase daily water intake to 64 ounces per day while taking this medication.  Advised if symptoms worsen and/or unresolved please follow-up with your PCP, OB or here for further evaluation.  Patient discharged home, hemodynamically stable. Final Clinical Impressions(s) / UC Diagnoses   Final diagnoses:   Allergic reaction, initial encounter  Rash and nonspecific skin eruption     Discharge Instructions      Advised patient medication daily for the next 3 to 5 days for allergic reaction rash.  Encouraged to increase daily water intake to 64 ounces per day while taking this medication.  Advised if symptoms worsen and/or unresolved please follow-up with your PCP, OB or here for further evaluation.     ED Prescriptions     Medication Sig Dispense Auth. Provider   fexofenadine  (ALLEGRA  ALLERGY) 180 MG tablet Take 1 tablet (180 mg total) by mouth daily for 15 days. 15 tablet Marquie Aderhold, FNP      PDMP not reviewed this encounter.   Teddy Sharper, FNP 02/20/24 1723

## 2024-02-20 NOTE — ED Triage Notes (Addendum)
 Pt presents to uc with co rash on neck for 3-4 days that is itchy in nature. Pt is concerned it may be a reaction to a new prenatal she tried (one-a-day). She stopped taking the medication 2 days ago. She is about 5 weeks preg. Appointment lined up for next month.

## 2024-02-20 NOTE — Discharge Instructions (Addendum)
 Advised patient medication daily for the next 3 to 5 days for allergic reaction rash.  Encouraged to increase daily water intake to 64 ounces per day while taking this medication.  Advised if symptoms worsen and/or unresolved please follow-up with your PCP, OB or here for further evaluation.

## 2024-02-23 ENCOUNTER — Ambulatory Visit: Payer: Self-pay | Admitting: Advanced Practice Midwife

## 2024-02-23 ENCOUNTER — Encounter (HOSPITAL_COMMUNITY): Payer: Self-pay | Admitting: Obstetrics & Gynecology

## 2024-02-23 ENCOUNTER — Other Ambulatory Visit: Payer: Self-pay

## 2024-02-23 ENCOUNTER — Inpatient Hospital Stay (HOSPITAL_COMMUNITY)
Admission: AD | Admit: 2024-02-23 | Discharge: 2024-02-23 | Disposition: A | Discharge status: Home / Self Care | Attending: Obstetrics & Gynecology | Admitting: Obstetrics & Gynecology

## 2024-02-23 ENCOUNTER — Telehealth: Payer: Self-pay | Admitting: Advanced Practice Midwife

## 2024-02-23 DIAGNOSIS — T7491XA Unspecified adult maltreatment, confirmed, initial encounter: Secondary | ICD-10-CM

## 2024-02-23 DIAGNOSIS — N939 Abnormal uterine and vaginal bleeding, unspecified: Secondary | ICD-10-CM

## 2024-02-23 DIAGNOSIS — S0993XA Unspecified injury of face, initial encounter: Secondary | ICD-10-CM | POA: Insufficient documentation

## 2024-02-23 DIAGNOSIS — Y92009 Unspecified place in unspecified non-institutional (private) residence as the place of occurrence of the external cause: Secondary | ICD-10-CM | POA: Diagnosis not present

## 2024-02-23 DIAGNOSIS — O3680X0 Pregnancy with inconclusive fetal viability, not applicable or unspecified: Secondary | ICD-10-CM | POA: Diagnosis not present

## 2024-02-23 DIAGNOSIS — Z3202 Encounter for pregnancy test, result negative: Secondary | ICD-10-CM | POA: Diagnosis not present

## 2024-02-23 DIAGNOSIS — O209 Hemorrhage in early pregnancy, unspecified: Secondary | ICD-10-CM

## 2024-02-23 DIAGNOSIS — R6884 Jaw pain: Secondary | ICD-10-CM | POA: Diagnosis present

## 2024-02-23 LAB — POCT PREGNANCY, URINE: Preg Test, Ur: NEGATIVE

## 2024-02-23 LAB — HCG, QUANTITATIVE, PREGNANCY: hCG, Beta Chain, Quant, S: 18 m[IU]/mL — ABNORMAL HIGH (ref <5)
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
## 2024-02-23 NOTE — Telephone Encounter (Signed)
 Called patient and verified identity using 2 markers.  Reviewed hcg results of 18 with patient, indicating positive pregnancy. This could be ectopic, IUP, or failed pregnancy.  Plan to repeat hcg in 48 hours, and message sent to set up lab at [facility]. Pt concerned about owing money or having debt at  [facility], so she is to present to MAU for lab if unable to complete at [facility].  Warning signs/reasons to come to MAU reviewed.
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
## 2024-02-26 ENCOUNTER — Other Ambulatory Visit

## 2024-02-26 DIAGNOSIS — O4690 Antepartum hemorrhage, unspecified, unspecified trimester: Secondary | ICD-10-CM

## 2024-02-28 ENCOUNTER — Ambulatory Visit (HOSPITAL_COMMUNITY): Admitting: Clinical

## 2024-02-28 DIAGNOSIS — F603 Borderline personality disorder: Secondary | ICD-10-CM

## 2024-02-28 DIAGNOSIS — F3181 Bipolar II disorder: Secondary | ICD-10-CM | POA: Diagnosis not present

## 2024-02-28 DIAGNOSIS — F411 Generalized anxiety disorder: Secondary | ICD-10-CM | POA: Diagnosis not present

## 2024-02-28 DIAGNOSIS — M546 Pain in thoracic spine: Secondary | ICD-10-CM | POA: Diagnosis not present
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
## 2024-02-29 ENCOUNTER — Encounter (HOSPITAL_COMMUNITY): Payer: Self-pay | Admitting: Clinical

## 2024-03-07 DIAGNOSIS — M546 Pain in thoracic spine: Secondary | ICD-10-CM | POA: Diagnosis not present

## 2024-03-13 ENCOUNTER — Encounter (HOSPITAL_COMMUNITY): Payer: Self-pay | Admitting: Clinical

## 2024-03-13 ENCOUNTER — Ambulatory Visit (INDEPENDENT_AMBULATORY_CARE_PROVIDER_SITE_OTHER): Admitting: Clinical

## 2024-03-13 DIAGNOSIS — F411 Generalized anxiety disorder: Secondary | ICD-10-CM

## 2024-03-13 DIAGNOSIS — Z1231 Encounter for screening mammogram for malignant neoplasm of breast: Secondary | ICD-10-CM | POA: Diagnosis not present

## 2024-03-13 DIAGNOSIS — F603 Borderline personality disorder: Secondary | ICD-10-CM

## 2024-03-13 DIAGNOSIS — F3181 Bipolar II disorder: Secondary | ICD-10-CM | POA: Diagnosis not present

## 2024-03-13 DIAGNOSIS — Z136 Encounter for screening for cardiovascular disorders: Secondary | ICD-10-CM | POA: Diagnosis not present

## 2024-03-13 DIAGNOSIS — M546 Pain in thoracic spine: Secondary | ICD-10-CM | POA: Diagnosis not present

## 2024-03-13 DIAGNOSIS — G43909 Migraine, unspecified, not intractable, without status migrainosus: Secondary | ICD-10-CM | POA: Diagnosis not present

## 2024-03-13 DIAGNOSIS — Z0000 Encounter for general adult medical examination without abnormal findings: Secondary | ICD-10-CM | POA: Diagnosis not present

## 2024-03-13 DIAGNOSIS — R109 Unspecified abdominal pain: Secondary | ICD-10-CM | POA: Diagnosis not present
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
## 2024-03-20 ENCOUNTER — Other Ambulatory Visit: Payer: Self-pay | Admitting: Internal Medicine

## 2024-03-20 ENCOUNTER — Other Ambulatory Visit (HOSPITAL_COMMUNITY): Payer: Self-pay | Admitting: Psychiatry

## 2024-03-20 DIAGNOSIS — K219 Gastro-esophageal reflux disease without esophagitis: Secondary | ICD-10-CM

## 2024-03-20 DIAGNOSIS — F411 Generalized anxiety disorder: Secondary | ICD-10-CM

## 2024-03-20 DIAGNOSIS — F410 Panic disorder [episodic paroxysmal anxiety] without agoraphobia: Secondary | ICD-10-CM
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
## 2024-03-27 ENCOUNTER — Telehealth (HOSPITAL_COMMUNITY): Admitting: Psychiatry

## 2024-03-27 ENCOUNTER — Encounter (HOSPITAL_COMMUNITY): Payer: Self-pay | Admitting: Psychiatry

## 2024-03-27 ENCOUNTER — Ambulatory Visit (HOSPITAL_COMMUNITY): Admitting: Clinical

## 2024-03-27 DIAGNOSIS — F603 Borderline personality disorder: Secondary | ICD-10-CM

## 2024-03-27 DIAGNOSIS — F411 Generalized anxiety disorder: Secondary | ICD-10-CM | POA: Diagnosis not present

## 2024-03-27 DIAGNOSIS — F3181 Bipolar II disorder: Secondary | ICD-10-CM

## 2024-03-27 DIAGNOSIS — F410 Panic disorder [episodic paroxysmal anxiety] without agoraphobia: Secondary | ICD-10-CM

## 2024-03-27 MED ORDER — PRAMIPEXOLE DIHYDROCHLORIDE 0.25 MG PO TABS: ORAL_TABLET | ORAL | 0 refills | Status: DC

## 2024-03-27 MED ORDER — PROPRANOLOL HCL 20 MG PO TABS: ORAL_TABLET | ORAL | 0 refills | Status: DC

## 2024-03-27 MED ORDER — HYDROXYZINE HCL 10 MG PO TABS: 10.0000 mg | ORAL_TABLET | Freq: Every day | ORAL | 2 refills | Status: DC | PRN

## 2024-04-03 DIAGNOSIS — M791 Myalgia, unspecified site: Secondary | ICD-10-CM | POA: Diagnosis not present

## 2024-04-03 DIAGNOSIS — E7800 Pure hypercholesterolemia, unspecified: Secondary | ICD-10-CM | POA: Diagnosis not present

## 2024-04-03 DIAGNOSIS — D72829 Elevated white blood cell count, unspecified: Secondary | ICD-10-CM | POA: Diagnosis not present

## 2024-04-10 ENCOUNTER — Ambulatory Visit (INDEPENDENT_AMBULATORY_CARE_PROVIDER_SITE_OTHER): Payer: Self-pay | Admitting: Clinical

## 2024-04-10 ENCOUNTER — Encounter (HOSPITAL_COMMUNITY): Payer: Self-pay | Admitting: Clinical

## 2024-04-10 DIAGNOSIS — Z91199 Patient's noncompliance with other medical treatment and regimen due to unspecified reason: Secondary | ICD-10-CM

## 2024-04-10 NOTE — Progress Notes (Signed)
 Therapy Progress Note  Patient had an appointment scheduled with therapist on 04/10/2024  at 4:00pm.  CSW called patient at 4:20pm and left a HIPAA-compliant voicemail.  sHe did not arrive for the session by 4:25pm, so was considered a no show.  As per [facility] policy, this will be listed as a no-show.  Encounter Diagnosis  Name Primary?   No-show for appointment Yes     Elgie Crest, LCSW 04/10/2024, 4:25 PM

## 2024-04-30 ENCOUNTER — Ambulatory Visit (HOSPITAL_COMMUNITY): Admitting: Clinical

## 2024-04-30 NOTE — Progress Notes (Signed)
 Therapy Progress Note  Patient had an appointment scheduled with therapist on 04/30/2024  at 4:00pm.  CSW called patient at 4:28pm and left a HIPAA-compliant voicemail.  She did not arrive for the session by 4:30pm, so was considered a no show.  As per [facility] policy, this will be be noted as a no-show appointment, her 2nd in a row.  Encounter Diagnosis  Name Primary?   No-show for appointment Yes     Elgie Crest, LCSW 04/30/2024, 4:30 PM

## 2024-05-13 DIAGNOSIS — M546 Pain in thoracic spine: Secondary | ICD-10-CM | POA: Diagnosis not present

## 2024-05-14 ENCOUNTER — Ambulatory Visit (HOSPITAL_COMMUNITY): Admitting: Clinical

## 2024-05-14 ENCOUNTER — Encounter (HOSPITAL_COMMUNITY): Payer: Self-pay | Admitting: Clinical

## 2024-05-14 DIAGNOSIS — F603 Borderline personality disorder: Secondary | ICD-10-CM

## 2024-05-14 DIAGNOSIS — F411 Generalized anxiety disorder: Secondary | ICD-10-CM

## 2024-05-14 DIAGNOSIS — F3181 Bipolar II disorder: Secondary | ICD-10-CM
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
## 2024-05-19 ENCOUNTER — Ambulatory Visit: Attending: Internal Medicine
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
## 2024-05-20 ENCOUNTER — Ambulatory Visit
Admission: RE | Admit: 2024-05-20 | Discharge: 2024-05-20 | Disposition: A | Discharge status: Home / Self Care | Source: Ambulatory Visit | Attending: Family Medicine | Admitting: Family Medicine

## 2024-05-20 VITALS — BP 115/77 | HR 61 | Temp 99.1°F | Resp 19

## 2024-05-20 DIAGNOSIS — N3000 Acute cystitis without hematuria: Secondary | ICD-10-CM

## 2024-05-20 LAB — POCT URINALYSIS DIP (MANUAL ENTRY)
Bilirubin, UA: NEGATIVE
Blood, UA: NEGATIVE
Glucose, UA: NEGATIVE mg/dL
Ketones, POC UA: NEGATIVE mg/dL
Nitrite, UA: NEGATIVE
Protein Ur, POC: NEGATIVE mg/dL
Spec Grav, UA: 1.02 (ref 1.010–1.025)
Urobilinogen, UA: 0.2 U/dL
pH, UA: 6.5 (ref 5.0–8.0)

## 2024-05-20 MED ORDER — CEFDINIR 300 MG PO CAPS: 300.0000 mg | ORAL_CAPSULE | Freq: Two times a day (BID) | ORAL | 0 refills | Status: AC

## 2024-05-20 NOTE — Discharge Instructions (Addendum)
 Advised patient take medication as directed with food to completion.  Encouraged increase daily water intake to 64 ounces per day while taking this medication.  Advised if symptoms worsen and/or unresolved please follow-up with your PCP or here for further evaluation.

## 2024-05-20 NOTE — ED Provider Notes (Signed)
 [name]    CSN: [id number] Arrival date & time: 05/20/24  1757      History   Chief Complaint Chief Complaint  Patient presents with   Urinary Frequency    Bladder infection - Entered by patient    HPI Kimberly Harper is a 38 y.o. female.   HPI 38 year old female presents with urinary frequency, pelvic pain, malodorous urine for 1 month. PMH significant for chronic headaches, bipolar 1 disorder, and anxiety.  Past Medical History:  Diagnosis Date   Alcoholic (HCC)    Anemia    Phreesia 01/17/2020   Anxiety    Asthma    Asthma 06/22/2021   Bipolar 1 disorder (HCC)    Blood transfusion without reported diagnosis    Phreesia 01/17/2020   Chest pain 03/01/2022   03/2021 xr negative.   Chronic headaches    Depression    Phreesia 01/17/2020   Lactose intolerance 03/01/2022   Major depressive disorder    Nausea 03/01/2022   Pregnancy, location unknown 11/21/2021   Formatting of this note might be different from the original. [facility] Quant Book Khadeeja Kuehne Language: English There is no such number on file (mobile). [phone] (home) [address] [place] [address] No e-mail address on record  [facility] (7th floor [place], [address]): M-F 7:30-18:00 [facility]: M-F 8:00-17:30 [facility]: M-F 8:30-17:00 (cl   Substance abuse (HCC)    Phreesia 01/17/2020    Patient Active Problem List   Diagnosis Date Noted   Chest injuries 07/20/2022   Vaginal pain 06/13/2022   Abnormal uterine bleeding (AUB) 03/13/2022   Nausea 03/01/2022   Chest pain 03/01/2022   Lactose intolerance 03/01/2022   Rash 03/01/2022   Epigastric pain 02/08/2022   Other allergic rhinitis 06/22/2021   Heartburn 06/22/2021   Panic disorder 04/12/2020   GAD (generalized anxiety disorder) 03/18/2020   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   Borderline personality disorder (HCC) 02/06/2020   Bipolar 2 disorder, major depressive episode (HCC)  12/06/2019    Past Surgical History:  Procedure Laterality Date   CESAREAN SECTION N/A    Phreesia 01/17/2020   COLONOSCOPY     earlier in year 2022   DILATION AND CURETTAGE OF UTERUS     HAND SURGERY Right    WISDOM TOOTH EXTRACTION      OB History     Gravida  5   Para  1   Term      Preterm  1   AB  3   Living  1      SAB  3   IAB      Ectopic      Multiple      Live Births  1        Obstetric Comments  LMP 01/13/2024          Home Medications    Prior to Admission medications   Medication Sig Start Date End Date Taking? Authorizing Provider  cefdinir (OMNICEF) 300 MG capsule Take 1 capsule (300 mg total) by mouth 2 (two) times daily for 7 days. 05/20/24 05/27/24 Yes Teddy Sharper, FNP  albuterol  (VENTOLIN  HFA) 108 (90 Base) MCG/ACT inhaler Inhale 1-2 puffs into the lungs every 6 (six) hours as needed for wheezing or shortness of breath. 06/08/22   Christopher Savannah, PA-C  fluticasone  (FLONASE ) 50 MCG/ACT nasal spray Place 1 spray into both nostrils 2 (two) times daily. 04/03/23   Stuart Vernell Norris, PA-C  hydrOXYzine  (ATARAX ) 10 MG tablet Take 1 tablet (10 mg total) by mouth daily as needed. 03/27/24   Carvin Arvella HERO, MD  pantoprazole  (PROTONIX ) 40 MG tablet TAKE 1 TABLET(40 MG) BY MOUTH DAILY 03/20/24   Jesus Bernardino MATSU, MD  pramipexole  (MIRAPEX ) 0.25 MG tablet TAKE 1 TABLET(0.25 MG) BY MOUTH THREE TIMES DAILY 03/27/24   Carvin Arvella HERO, MD  propranolol  (INDERAL ) 20 MG tablet TAKE 1 TABLET(20 MG) BY MOUTH twice a day 03/27/24   Carvin Arvella HERO, MD    Family History Family History  Problem Relation Age of Onset   Hypertension Mother    Diabetes Mother    Stroke Mother    Depression Mother    Depression Sister    Irritable bowel syndrome Sister    Asthma Brother    Allergic rhinitis Brother    Migraines Brother    Depression Brother    Stroke Maternal Grandmother    Diabetes Maternal Grandmother    Alcohol abuse Maternal Grandfather    Stroke  Paternal Grandmother    Breast cancer Paternal Grandmother    Alcohol abuse Paternal Grandfather    Lupus Half-Sister    Angioedema Neg Hx    Eczema Neg Hx    Immunodeficiency Neg Hx    Atopy Neg Hx     Social History Social History   Tobacco Use   Smoking status: Former    Current packs/day: 0.00    Types: Cigarettes    Quit date: 09/07/2006    Years since quitting: 17.7   Smokeless tobacco: Never  Vaping Use   Vaping status: Never Used  Substance Use Topics   Alcohol use: Yes    Comment: occ   Drug use: Not Currently     Allergies   Neosporin [bacitracin-polymyxin b], Multiple vitamins-iron, Mupirocin, and Prednisone   Review of Systems Review of Systems  Genitourinary:  Positive for dysuria and frequency.     Physical Exam Triage Vital Signs ED Triage Vitals  Encounter Vitals Group     BP      Girls Systolic BP Percentile      Girls Diastolic BP Percentile      Boys Systolic BP Percentile      Boys Diastolic BP Percentile      Pulse      Resp      Temp      Temp src      SpO2      Weight      Height      Head Circumference      Peak Flow      Pain Score      Pain Loc      Pain Education      Exclude from Growth Chart    No data found.  Updated Vital Signs BP 115/77   Pulse 61   Temp 99.1 F (37.3 C)   Resp 19   LMP 12/23/2023 (Exact Date)   SpO2 98%   Breastfeeding No   Visual Acuity Right Eye Distance:   Left Eye Distance:   Bilateral Distance:    Right Eye Near:   Left Eye Near:    Bilateral Near:     Physical Exam Vitals and nursing note reviewed.  Constitutional:      Appearance: Normal appearance. She is normal weight.  HENT:     Head: Normocephalic and atraumatic.     Mouth/Throat:     Mouth: Mucous membranes are moist.     Pharynx: Oropharynx is  clear.  Eyes:     Extraocular Movements: Extraocular movements intact.     Conjunctiva/sclera: Conjunctivae normal.     Pupils: Pupils are equal, round, and reactive to  light.  Cardiovascular:     Rate and Rhythm: Normal rate and regular rhythm.     Pulses: Normal pulses.     Heart sounds: Normal heart sounds.  Pulmonary:     Effort: Pulmonary effort is normal.     Breath sounds: Normal breath sounds. No wheezing, rhonchi or rales.  Musculoskeletal:        General: Normal range of motion.  Skin:    General: Skin is warm and dry.  Neurological:     General: No focal deficit present.     Mental Status: She is alert and oriented to person, place, and time. Mental status is at baseline.  Psychiatric:        Mood and Affect: Mood normal.        Behavior: Behavior normal.      UC Treatments / Results  Labs (all labs ordered are listed, but only abnormal results are displayed) Labs Reviewed  POCT URINALYSIS DIP (MANUAL ENTRY) - Abnormal; Notable for the following components:      Result Value   Clarity, UA cloudy (*)    Leukocytes, UA Trace (*)    All other components within normal limits  URINE CULTURE    EKG   Radiology No results found.  Procedures Procedures (including critical care time)  Medications Ordered in UC Medications - No data to display  Initial Impression / Assessment and Plan / UC Course  I have reviewed the triage vital signs and the nursing notes.  Pertinent labs & imaging results that were available during my care of the patient were reviewed by me and considered in my medical decision making (see chart for details).     MDM: 1.  Acute cystitis with hematuria-UA revealed above, urine culture ordered, Rx'd cefdinir 300 mg capsule: Take 1 capsule twice daily x 7 days. Advised patient take medication as directed with food to completion.  Encouraged increase daily water intake to 64 ounces per day while taking this medication.  Advised if symptoms worsen and/or unresolved please follow-up with your PCP or here for further evaluation.  Final Clinical Impressions(s) / UC Diagnoses   Final diagnoses:  Acute cystitis  without hematuria     Discharge Instructions      Advised patient take medication as directed with food to completion.  Encouraged increase daily water intake to 64 ounces per day while taking this medication.  Advised if symptoms worsen and/or unresolved please follow-up with your PCP or here for further evaluation.     ED Prescriptions     Medication Sig Dispense Auth. Provider   cefdinir (OMNICEF) 300 MG capsule Take 1 capsule (300 mg total) by mouth 2 (two) times daily for 7 days. 14 capsule Symphoni Helbling, FNP      PDMP not reviewed this encounter.   Teddy Sharper, FNP 05/20/24 AMOS

## 2024-05-20 NOTE — ED Triage Notes (Signed)
 Pt presents to uc with co urinary frequency, pelvic pain, malodors urine for one month. Pt reports no otc medications.

## 2024-05-21 ENCOUNTER — Telehealth: Payer: Self-pay

## 2024-05-21 NOTE — Telephone Encounter (Signed)
Called to check on patient. No answer. Left voicemail.

## 2024-05-22 ENCOUNTER — Encounter (HOSPITAL_COMMUNITY): Admitting: Psychiatry

## 2024-05-22 ENCOUNTER — Encounter (HOSPITAL_COMMUNITY): Payer: Self-pay

## 2024-05-22 DIAGNOSIS — F3181 Bipolar II disorder: Secondary | ICD-10-CM

## 2024-05-22 NOTE — Progress Notes (Signed)
 This encounter was created in error - please disregard.  Patient did not show up for the appointment.  Appointment reminders were sent to patient's phone and email.

## 2024-05-25 LAB — URINE CULTURE: Culture: <10000 — AB

## 2024-05-26 ENCOUNTER — Ambulatory Visit (HOSPITAL_COMMUNITY): Payer: Self-pay

## 2024-06-04 ENCOUNTER — Telehealth: Payer: Self-pay

## 2024-06-04 NOTE — Telephone Encounter (Signed)
 Patient is overdue for an appointment. LMOM for patient to call and schedule.

## 2024-06-10 ENCOUNTER — Telehealth: Payer: Self-pay

## 2024-06-10 NOTE — Telephone Encounter (Signed)
 Patient is overdue for an appointment. LMOM for patient to call and schedule.

## 2024-06-11 NOTE — Progress Notes (Unsigned)
 BH MD/PA/NP OP Progress Note  06/12/2024 9:13 AM Kimberly Harper  MRN:  [id number]  Visit Diagnosis:    ICD-10-CM   1. Generalized anxiety disorder  F41.1 pramipexole  (MIRAPEX ) 0.25 MG tablet       Assessment: Kimberly Harper is a 38 y.o. female with a history of borderline personality disorder, Bipolar 2 disorder, PTSD,  GAD, and panic disorder who presents virtually to [facility] for initial evaluation on 09/12/2022.  At initial evaluation patient reported symptoms of increased anxiety, fatigue, tearfulness, negative self thoughts, worthlessness, nonsuicidal self-injury, and intermittent SI without intent or plan.  Safety planning was discussed as well as alternative means to try manage nonsuicidal self-injury.  Patient had a significant past history of bipolar 2 disorder endorsed episodes of hypomania in the past which can include decreased sleep, increased energy, reckless and impulsive behaviors, and excessively elevated mood.  These episodes can last 1 to 3 days.  She denied any episodes of full mania.  Of note patient was also screened for borderline personality disorder which she has been diagnosed with in the past and did endorse real or perceived feelings of abandonment, chronic feelings of emptiness, splitting, impulsive behaviors, difficulty with interpersonal relationships, poor sense of self, and NSSI.  Patient met criteria for bipolar 2 disorder, borderline personality disorder, and generalized anxiety disorder.  While she does have a significant past history of trauma and some traits of PTSD she does not have enough to meet criteria.  Treatment options were discussed however patient notes trying a number of medications in the past with poor or sometimes negative effect including nausea, excessive sedation, or increased suicidality.  She was not interested in starting a mood stabilizer at this time.  Due to the bipolar 2 diagnosis we  would not start an antidepressant medication without some mood stabilization.  Patient was open to as needed medication for anxiety.   Kimberly Harper presents for follow-up evaluation. Today, 06/12/24, patient has had fluctuation in mood symptoms in the interim between euthymic and depressed.  Notably moods tend to improve when she takes medications consistently.  The depression is more notable and significant while off medications.  There are still ongoing psychosocial stressors with her ex and finances.  She is taking as needed medications with benefit though does endorse sedation from hydroxyzine .  Discussed alternative options however patient declined noting having numerous past failed trials or adverse side effects from other as needed medications.  Will continue on current regimen and follow-up in 2 months.  Plan: - Continue pramipexole  to 0.25 mg QD due to nausea  - Continue Phenergan  5 mg Q8H prn for nausea, recommend f/u with PCP for continued management - Continue Propranolol  20 mg QD for migraines - Continue Atarax  10 mg daily prn   - CMP, Beta HCG reviewed - Continue therapy with Elgie Crest - Recommend DBT - Crisis resources reviewed - Follow up in 2 months  Chief Complaint:  Chief Complaint  Patient presents with   Follow-up   HPI: Kimberly Harper presents for follow up reporting that she is hanging in there.  Mentally she has been having ups and downs over the past couple months.  Patient states I guess I am ok, when I take the medicine I do better. When I don't take it I am more depressed, don't want to bothered with getting up or doing things.  Discussed medication compliance the patient reports difficulty with remembering giving her fluctuating schedule.  She carries her  medications in her bag so that she will have it if she is on the move.  However some days she can forget her bag or not have a drink when is time to take it and forget about it later.  Discussed some  strategies to help improve compliance including setting an alarm on her phone or putting a pill container next to the keys so it becomes a routine to take it every morning.  Patient discussed ongoing stressors in her life.  Her mom is now in the nursing home permanently and patient is living in her house.  She has taken over her mom's bills that the lease is only good until February.  She wants to get her own place afterwards but does not have the finances to do so.  Likely this will change given that she has been unable to save and is spending most of her money to pay existing bills.  Of note patient did start marriage counseling with her ex.  This is something that he had pushed for and did the leg work with.  She has seen improvement but is cautious about taking things too fast.  This would not be the first time that he is temporarily improved before returning to his always.  Given this she does not express any plan to move back in with him and reports that she would choose to live in a [place] or an RV before doing so.  Discussed the remainder of her medications and patient reports that she has been taking the hydroxyzine  more than usual.  She does find it helpful but sedating.  We reviewed other options patient reports having tried several of them with Xanax  being the only 1 that was consistently better.  That said the Xanax  caused memory issues which significantly concerned her.  We did explore the option of as needed Ativan to be taken up to 5 times a month.  Patient however declined stating concern of memory side effects.  She preferred to continue on current regimen.  Past Psychiatric History: Patient had seen Dr. Lynnetta in the past and the [facility] center more recently. She reports that she has been hospitalized multiple times including once in [place] , several times in [place], and twice at [facility].  She has been through the [facility] in 2021 and  again in 2023.  Has used Abilify , Seroquel  (weight gain), Geodon, Latuda, Zoloft , Wellbutrin , Remeron , Celexa, trazodone , trintellix , BuSpar , propranolol , gabapentin , Atarax ,  Prazosin , Lamictal, lithium, Xanax  (benefit, but memory issues). A lot of these made her too nauseas or too sleepy.  Reports having 2-4 alcoholic drinks a couple times a month. Alsoe endorsed marijuana use in the past. Last in 2008. She denies any other substance use history.   Past Medical History:  Past Medical History:  Diagnosis Date   Alcoholic (HCC)    Anemia    Phreesia 01/17/2020   Anxiety    Asthma    Asthma 06/22/2021   Bipolar 1 disorder (HCC)    Blood transfusion without reported diagnosis    Phreesia 01/17/2020   Chest pain 03/01/2022   03/2021 xr negative.   Chronic headaches    Depression    Phreesia 01/17/2020   Lactose intolerance 03/01/2022   Major depressive disorder    Nausea 03/01/2022   Pregnancy, location unknown 11/21/2021   Formatting of this note might be different from the original. [facility] Quant Book Alba Ozdemir Language: English There is no such  number on file (mobile). [phone] (home) [address] [place] [address] No e-mail address on record  [facility] [place] (7th floor [place]): M-F 7:30-18:00 [facility]: M-F 8:00-17:30 [facility]: M-F 8:30-17:00 (cl   Substance abuse (HCC)    Phreesia 01/17/2020    Past Surgical History:  Procedure Laterality Date   CESAREAN SECTION N/A    Phreesia 01/17/2020   COLONOSCOPY     earlier in year 2022   DILATION AND CURETTAGE OF UTERUS     HAND SURGERY Right    WISDOM TOOTH EXTRACTION     Family History:  Family History  Problem Relation Age of Onset   Hypertension Mother    Diabetes Mother    Stroke Mother    Depression Mother    Depression Sister    Irritable bowel syndrome Sister    Asthma Brother    Allergic rhinitis Brother    Migraines Brother    Depression Brother     Stroke Maternal Grandmother    Diabetes Maternal Grandmother    Alcohol abuse Maternal Grandfather    Stroke Paternal Grandmother    Breast cancer Paternal Grandmother    Alcohol abuse Paternal Grandfather    Lupus Half-Sister    Angioedema Neg Hx    Eczema Neg Hx    Immunodeficiency Neg Hx    Atopy Neg Hx     Social History:  Social History   Socioeconomic History   Marital status: Single    Spouse name: Not on file   Number of children: 1   Years of education: Not on file   Highest education level: Not on file  Occupational History   Occupation: academic librarian  Tobacco Use   Smoking status: Former    Current packs/day: 0.00    Types: Cigarettes    Quit date: 09/07/2006    Years since quitting: 17.7   Smokeless tobacco: Never  Vaping Use   Vaping status: Never Used  Substance and Sexual Activity   Alcohol use: Yes    Comment: occ   Drug use: Not Currently   Sexual activity: Yes    Birth control/protection: None  Other Topics Concern   Not on file  Social History Narrative   Right handed   Social Drivers of Health   Financial Resource Strain: Not on file  Food Insecurity: Unknown (03/07/2024)   Received from [facility]   Hunger Vital Sign    Within the past 12 months, you worried that your food would run out before you got money to buy more: Patient declined to answer    Within the past 12 months, the food you bought just didn't last and you didn't have money to get more. : Patient declined to answer  Transportation Needs: Not on file (03/07/2024)  Physical Activity: Not on file  Stress: Not on file  Social Connections: Unknown (11/21/2021)   [facility] from [facility]   Social Network    Social Network: Not on file    Allergies:  Allergies  Allergen Reactions   Neosporin [Bacitracin-Polymyxin B] Itching   Multiple Vitamins-Iron Swelling    States that vitamins, folic acid  and iron have caused swelling of tongue and gums   Mupirocin Rash    Prednisone Other (See Comments)    Altered mental status    Current Medications: Current Outpatient Medications  Medication Sig Dispense Refill   albuterol  (VENTOLIN  HFA) 108 (90 Base) MCG/ACT inhaler Inhale 1-2 puffs into the lungs  every 6 (six) hours as needed for wheezing or shortness of breath. 18 g 0   fluticasone  (FLONASE ) 50 MCG/ACT nasal spray Place 1 spray into both nostrils 2 (two) times daily. 16 g 2   hydrOXYzine  (ATARAX ) 10 MG tablet Take 1 tablet (10 mg total) by mouth daily as needed. 30 tablet 2   pantoprazole  (PROTONIX ) 40 MG tablet TAKE 1 TABLET(40 MG) BY MOUTH DAILY 90 tablet 1   pramipexole  (MIRAPEX ) 0.25 MG tablet TAKE 1 TABLET(0.25 MG) BY MOUTH THREE TIMES DAILY 270 tablet 0   propranolol  (INDERAL ) 20 MG tablet TAKE 1 TABLET(20 MG) BY MOUTH twice a day 180 tablet 0   No current facility-administered medications for this visit.     Psychiatric Specialty Exam: Review of Systems  Last menstrual period 12/23/2023.There is no height or weight on file to calculate BMI.  General Appearance: Fairly Groomed  Eye Contact:  Good  Speech:  Clear and Coherent  Volume:  Normal  Mood:  Depressed and Euthymic  Affect:  Labile  Thought Process:  Coherent  Orientation:  Full (Time, Place, and Person)  Thought Content: Logical   Suicidal Thoughts:  No  Homicidal Thoughts:  No  Memory:  Immediate;   Good  Judgement:  Fair  Insight:  Lacking  Psychomotor Activity:  Normal  Concentration:  Concentration: Fair  Recall:  Fair  Fund of Knowledge: Fair  Language: Good  Akathisia:  NA    AIMS (if indicated): not done  Assets:  Communication Skills Desire for Improvement Housing Talents/Skills Transportation Vocational/Educational  ADL's:  Intact  Cognition: WNL  Sleep:  Fair   Metabolic Disorder Labs: Lab Results  Component Value Date   HGBA1C 5.3 01/19/2020   MPG 105 12/07/2019   No results found for: PROLACTIN Lab Results  Component Value Date   CHOL 189  01/19/2020   TRIG 109 01/19/2020   HDL 47 01/19/2020   CHOLHDL 4.0 01/19/2020   VLDL 9 12/07/2019   LDLCALC 122 (H) 01/19/2020   LDLCALC 141 (H) 12/07/2019   Lab Results  Component Value Date   TSH 1.16 04/12/2021   TSH 0.50 01/20/2021    Therapeutic Level Labs: No results found for: LITHIUM No results found for: VALPROATE No results found for: CBMZ   Screenings: AIMS    Flowsheet Row [facility]Dis[facility]  AIMS Total Score 0   AUDIT    Flowsheet Row Admission (Discharged) from [facility]  Alcohol Use Disorder Identification Test Final Score (AUDIT) 1   GAD-7    Flowsheet Row Counselor from 08/24/2023 in [facility] Visit from 12/30/2020 in [facility] Visit from 01/19/2020 in [facility] Care at [facility]  Total GAD-7 Score 18 16 10    PHQ2-9    Flowsheet Row Counselor from 08/24/2023 in [facility] from 08/17/2022 in [facility] at [facility] Visit from 03/01/2022 in [facility] [place] from 08/01/2021 in [facility] Office Visit from 04/12/2021 in [facility] Total Score 4 2 0 4 1  PHQ-9 Total Score 16 13 -- 20 8   Flowsheet Row UC from 05/20/2024 in [facility] [place] [facility] from 02/20/2024 in [facility]  from 11/26/2023 in [facility] RISK CATEGORY No Risk No Risk No Risk    Collaboration of Care: Collaboration of Care: Medication Management AEB medication prescription, Other provider involved in patient's care AEB urgent care and ED chart review, and Referral or  follow-up with counselor/therapist AEB chart review  Patient/Guardian was advised Release of Information must be obtained prior to any record release in order to collaborate their care with an outside provider. Patient/Guardian was advised if they have not already done so to contact the registration department to sign all necessary forms in order for us  to release information regarding their care.   Consent: Patient/Guardian gives verbal consent for treatment and assignment of benefits for services provided during this visit. Patient/Guardian expressed understanding and agreed to proceed.    Arvella CHRISTELLA Finder, MD 06/12/2024, 9:13 AM   Virtual Visit via Video Note  I connected with Rainelle Harper on 06/12/24 at  8:30 AM EST by a video enabled telemedicine application and verified that I am speaking with the correct person using two identifiers.  Location: Patient: Home Provider: Home Office   I discussed the limitations of evaluation and management by telemedicine and the availability of in person appointments. The patient expressed understanding and agreed to proceed.   I discussed the assessment and treatment plan with the patient. The patient was provided an opportunity to ask questions and all were answered. The patient agreed with the plan and demonstrated an understanding of the instructions.   The patient was advised to call back or seek an in-person evaluation if the symptoms worsen or if the condition fails to improve as anticipated.  I provided 30 minutes of non-face-to-face time during this encounter.   Arvella CHRISTELLA Finder, MD

## 2024-06-12 ENCOUNTER — Ambulatory Visit: Admitting: Sports Medicine

## 2024-06-12 ENCOUNTER — Telehealth (HOSPITAL_COMMUNITY): Admitting: Psychiatry

## 2024-06-12 ENCOUNTER — Encounter (HOSPITAL_COMMUNITY): Payer: Self-pay | Admitting: Psychiatry

## 2024-06-12 DIAGNOSIS — F411 Generalized anxiety disorder: Secondary | ICD-10-CM | POA: Diagnosis not present

## 2024-06-12 MED ORDER — PRAMIPEXOLE DIHYDROCHLORIDE 0.25 MG PO TABS: ORAL_TABLET | ORAL | 0 refills | Status: AC

## 2024-06-24 ENCOUNTER — Ambulatory Visit (INDEPENDENT_AMBULATORY_CARE_PROVIDER_SITE_OTHER): Admitting: Clinical

## 2024-06-24 DIAGNOSIS — F603 Borderline personality disorder: Secondary | ICD-10-CM | POA: Diagnosis not present

## 2024-06-24 DIAGNOSIS — F3181 Bipolar II disorder: Secondary | ICD-10-CM | POA: Diagnosis not present

## 2024-06-24 DIAGNOSIS — F411 Generalized anxiety disorder: Secondary | ICD-10-CM
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
## 2024-06-25 ENCOUNTER — Other Ambulatory Visit (HOSPITAL_COMMUNITY): Payer: Self-pay | Admitting: Psychiatry

## 2024-06-25 DIAGNOSIS — F410 Panic disorder [episodic paroxysmal anxiety] without agoraphobia: Secondary | ICD-10-CM

## 2024-06-25 DIAGNOSIS — F411 Generalized anxiety disorder: Secondary | ICD-10-CM

## 2024-06-26 ENCOUNTER — Encounter (HOSPITAL_COMMUNITY): Payer: Self-pay | Admitting: Clinical

## 2024-07-10 ENCOUNTER — Telehealth (HOSPITAL_COMMUNITY): Payer: Self-pay | Admitting: Clinical

## 2024-07-11 ENCOUNTER — Ambulatory Visit (HOSPITAL_COMMUNITY): Admitting: Clinical

## 2024-07-11 DIAGNOSIS — F3181 Bipolar II disorder: Secondary | ICD-10-CM

## 2024-07-11 DIAGNOSIS — F603 Borderline personality disorder: Secondary | ICD-10-CM | POA: Diagnosis not present

## 2024-07-11 DIAGNOSIS — F411 Generalized anxiety disorder: Secondary | ICD-10-CM
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
## 2024-07-15 ENCOUNTER — Encounter (HOSPITAL_COMMUNITY): Payer: Self-pay | Admitting: Clinical

## 2024-07-20 NOTE — ED Triage Notes (Signed)
 A few days ago I was in distress. Got upset with spouse. Recurring infidelity. Used a box cutter to cut left arm. Superficial cuts noted. Wanted to feel something different. I've had a lot of support the past few days. Recent suicidal thoughts. Denies AVH.   Hospitalized 3-4 years ago for mental health.

## 2024-07-20 NOTE — ED Provider Notes (Signed)
 "          Chief Complaint  Patient presents with   Mental Health Problem       HPI  History provided by:  Patient Laceration Location:  Shoulder/arm Shoulder/arm laceration location:  L forearm Length:  2 cm Depth:  Cutaneous Quality: straight   Bleeding: controlled   Laceration mechanism:  Knife Pain details:    Quality:  Aching   Severity:  Mild   Timing:  Constant   Progression:  Worsening Relieved by:  Nothing Worsened by:  Nothing Ineffective treatments:  None tried Tetanus status:  Unknown Associated symptoms: rash, redness and swelling   Associated symptoms: no fever, no focal weakness, no numbness and no streaking       Patient History Medical History[1] Surgical History[2] Family History[3] Social History[4]    Review of Systems Review of Systems  Constitutional:  Negative for fever.  HENT:  Negative for facial swelling.   Eyes:  Negative for redness.  Respiratory:  Negative for shortness of breath.   Cardiovascular:  Negative for chest pain.  Gastrointestinal:  Negative for nausea and vomiting.  Genitourinary:  Negative for flank pain.  Musculoskeletal:  Negative for arthralgias.  Skin:  Positive for rash.  Neurological:  Negative for focal weakness and numbness.  Psychiatric/Behavioral:  Positive for dysphoric mood and self-injury. Negative for suicidal ideas.       Physical Exam ED Triage Vitals [07/20/24 1953]  Temp 98.8 F (37.1 C)  Heart Rate 77  Resp 18  BP 135/81  MAP (mmHg) 97  SpO2 100 %  O2 Device None (Room air)  O2 Flow Rate (L/min)   Weight 68 kg (150 lb)   Physical Exam Constitutional:      General: She is not in acute distress. HENT:     Head: Normocephalic and atraumatic.     Nose: Nose normal.     Mouth/Throat:     Mouth: Mucous membranes are moist.  Eyes:     Extraocular Movements: Extraocular movements intact.     Pupils: Pupils are equal, round, and reactive to light.  Cardiovascular:     Rate and  Rhythm: Normal rate and regular rhythm.  Pulmonary:     Effort: Pulmonary effort is normal.     Breath sounds: Normal breath sounds.  Abdominal:     Palpations: Abdomen is soft.     Tenderness: There is no abdominal tenderness.  Musculoskeletal:        General: Normal range of motion.     Left forearm: Laceration present.     Cervical back: Normal range of motion.  Skin:    General: Skin is warm.     Findings: Laceration present. No rash.  Neurological:     General: No focal deficit present.     Mental Status: She is alert.  Psychiatric:        Mood and Affect: Mood is depressed.        Thought Content: Thought content does not include homicidal or suicidal ideation. Thought content does not include homicidal or suicidal plan.        CHA2DS2-VASc Score: N/A  Glasgow Coma Scale Score: 15                  Procedures                       ED Course & MDM   Medical Decision Making 38 year old female with past medical and surgical history as  noted above who presents in the setting of wound evaluation.  Patient has been under a lot of stress and recently.  Has been more depressed.  She has been cutting to help with this.  She is concerned 1 of these areas infected.  Additionally she has been having depressed mood but has good family support.  Patient denies that she plans to harm herself or harm others.  Has had passive suicidal thoughts in the past but no plan to harm herself.  Has never had an attempt in the past.  Due to this issues came in for further evaluation.  On arrival patient was hemodynamically stable and afebrile.  On examination as noted in image patient does have area concerning for early cellulitis with wound.  No signs of abscess.  Not concern for necrotizing soft tissue infection.  Will update tetanus as well.  Had long discussion with patient regarding mental health.  At this time she is adamant she has no plans to harm herself or harm others.  Patient has good family  support.  She has a mental health provider she can contact tomorrow.  Offered evaluation by our mental health team but after shared decision making she was comfortable plan to initiate antibiotic therapy and have close outpatient follow-up.  I do not believe patient is a danger to herself or others.  Will discharge home with Keflex  and strict return precaution given.  Stable time of discharge.  Patient and family in agreement with plan.  Note: Chief executive officer was used in the creation of this note.  Throughout the entirety of patient encounter I wore personal protective equipment in accordance with guidelines at that time. Additionally, if controlled substance prescribed during this visit then appropriate database was reviewed prior to prescription initiation.  Problems Addressed: Cellulitis of left upper extremity: complicated acute illness or injury History of recent stressful life event: complicated acute illness or injury  Amount and/or Complexity of Data Reviewed Independent Historian: parent External Data Reviewed: notes.    Details: Previous orthopaedic evaluation  Risk OTC drugs. Prescription drug management. Decision regarding hospitalization.      ED Disposition:  Discharge Final diagnoses:  Cellulitis of left upper extremity  History of recent stressful life event    ED Prescriptions     Medication Sig Dispense Start Date End Date Auth. Provider   cephALEXin  (KEFLEX ) 500 mg capsule Take 1 capsule (500 mg total) by mouth 4 (four) times a day for 7 days. 28 capsule 07/20/2024 07/27/2024 Prentice Ivonne Shearing, MD             [1] Past Medical History: Diagnosis Date   Anxiety    Depression    GERD (gastroesophageal reflux disease)   [2] History reviewed. No pertinent surgical history. [3] No family history on file. [4] Social History Tobacco Use   Smoking status: Never   Smokeless tobacco: Never  Vaping Use   Vaping status: Never  Used  Substance Use Topics   Drug use: Never  "

## 2024-07-21 NOTE — Telephone Encounter (Signed)
 On 07/10/24, CSW contacted patient to offer next-day appointment for 07/11/24, which she accepted.  Elgie Crest, LCSW 07/21/2024, 8:35 AM

## 2024-07-22 ENCOUNTER — Telehealth: Payer: Self-pay

## 2024-07-22 ENCOUNTER — Encounter (HOSPITAL_COMMUNITY): Payer: Self-pay | Admitting: Clinical

## 2024-07-22 ENCOUNTER — Ambulatory Visit (INDEPENDENT_AMBULATORY_CARE_PROVIDER_SITE_OTHER): Admitting: Clinical

## 2024-07-22 DIAGNOSIS — F411 Generalized anxiety disorder: Secondary | ICD-10-CM | POA: Diagnosis not present

## 2024-07-22 DIAGNOSIS — F603 Borderline personality disorder: Secondary | ICD-10-CM

## 2024-07-22 DIAGNOSIS — F3181 Bipolar II disorder: Secondary | ICD-10-CM

## 2024-07-22 NOTE — Telephone Encounter (Signed)
 Transition Care Management Unsuccessful Follow-up Telephone Call  Date of discharge and from where:  07/20/24; [facility] - EMERGENCY DEPARTMENT    Attempts:  1st Attempt  Reason for unsuccessful TCM follow-up call:  Unable to leave message; if patient returns call please schedule ED follow up with PCP.
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
## 2024-07-26 ENCOUNTER — Other Ambulatory Visit (HOSPITAL_COMMUNITY): Payer: Self-pay | Admitting: Psychiatry

## 2024-07-26 DIAGNOSIS — F411 Generalized anxiety disorder: Secondary | ICD-10-CM

## 2024-07-30 ENCOUNTER — Encounter (HOSPITAL_COMMUNITY): Payer: Self-pay | Admitting: Clinical

## 2024-07-30 ENCOUNTER — Ambulatory Visit (HOSPITAL_COMMUNITY): Admitting: Clinical

## 2024-07-30 DIAGNOSIS — F411 Generalized anxiety disorder: Secondary | ICD-10-CM

## 2024-07-30 DIAGNOSIS — F3181 Bipolar II disorder: Secondary | ICD-10-CM

## 2024-07-30 DIAGNOSIS — F603 Borderline personality disorder: Secondary | ICD-10-CM
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
## 2024-08-04 NOTE — Progress Notes (Unsigned)
 BH MD/PA/NP OP Progress Note  08/05/2024 2:04 PM Kimberly Carloyn Harper  MRN:  [id number]  Visit Diagnosis:    ICD-10-CM   1. GAD (generalized anxiety disorder)  F41.1 propranolol  (INDERAL ) 20 MG tablet    2. Panic disorder  F41.0 propranolol  (INDERAL ) 20 MG tablet    3. Generalized anxiety disorder  F41.1 hydrOXYzine  (ATARAX ) 10 MG tablet      Assessment: Kimberly Harper is a 39 y.o. female with a history of borderline personality disorder, Bipolar 2 disorder, PTSD,  GAD, and panic disorder who presents virtually to [facility] at [facility] for initial evaluation on 09/12/2022.  At initial evaluation patient reported symptoms of increased anxiety, fatigue, tearfulness, negative self thoughts, worthlessness, nonsuicidal self-injury, and intermittent SI without intent or plan.  Safety planning was discussed as well as alternative means to try manage nonsuicidal self-injury.  Patient had a significant past history of bipolar 2 disorder endorsed episodes of hypomania in the past which can include decreased sleep, increased energy, reckless and impulsive behaviors, and excessively elevated mood.  These episodes can last 1 to 3 days.  She denied any episodes of full mania.  Of note patient was also screened for borderline personality disorder which she has been diagnosed with in the past and did endorse real or perceived feelings of abandonment, chronic feelings of emptiness, splitting, impulsive behaviors, difficulty with interpersonal relationships, poor sense of self, and NSSI.  Patient met criteria for bipolar 2 disorder, borderline personality disorder, and generalized anxiety disorder.  While she does have a significant past history of trauma and some traits of PTSD she does not have enough to meet criteria.  Patient notes trying a number of medications in the past with poor or sometimes negative effect including nausea, excessive sedation, or increased suicidality.  She  was not interested in starting a mood stabilizer at this time.  Patient was open to as needed medication for anxiety.   Kimberly Harper presents for follow-up evaluation. Today, 08/05/2024, patient has ongoing neurovegetative symptoms of depression in addition to an increase in irritability/agitation.  There are still significant ongoing psychosocial stressors both with her ex partner and her son.  She is taking medications more consistently though still can struggle with compliance.  Patient is interested in adjunct medication with mood stabilizer.  Reviewed options and patient would like to get GeneSight testing first before starting a medication due to prior adverse side effects.  Continue on current regimen and follow-up in a month.  Psychotherapeutic interventions were used during today's session. From 1:06 PM to 1:26 PM. Therapeutic interventions included empathic listening, supportive therapy, cognitive and behavioral therapy, motivational interviewing. Used supportive interviewing techniques to provide emotional validation. Worked on cognitive reframing techniques and unhelpful thoughts challenged as appropriate.  Challenged cognitive distortion she attributed all of her current negatives to past actions. Alternative thoughts developed with guidance. Reviewed some techniques to facilitate increased behavioral activation. Improvement was evidenced by patient's participation and identified commitment to therapy goals.    Plan: - Continue pramipexole  to 0.25 mg QD due to nausea  - Continue Phenergan  5 mg Q8H prn for nausea, recommend f/u with PCP for continued management - Continue Propranolol  20 mg QD for migraines - Continue Atarax  10 mg daily prn   - CMP, Beta HCG reviewed - Continue therapy with Elgie Crest - Recommend DBT - Crisis resources reviewed - Follow up in a month  Chief Complaint:  Chief Complaint  Patient presents with   Follow-up  HPI: Kimberly Harper presents for  follow up reporting that she is regular I guess. [name] thinks that things are never ending. There had been ongoing issues at the apartment with her son. He did not want to help, was smoking marijuana in the house, and had people coming to stay for day at a time all of which bothered her. Eventually she stopped paying for the bills and moved out this past weekend. He has since used her mothers bank job to pay the rent, over drafting it. Kimberly Harper is filling out paperwork to get power of attorney for her mother today.  Patient has moved back in with her sister.  Not really communicating with the spouse. She filed a report against him due concerns on his child care and safety in the home. She has tried to help and provide guidance but he has not changed. Since then she has stopped communicating with her ex. Patient reports no intention to go back. Discussed this and patterns of returning in the past. Reviewed importance of firm boundaries.  Finding herself to be very angry again like she used to be. She has been breaking objects again like she had in the past. She is frustrated as this is a regression to the past, compared to where she had been. Is unsure of what the cause for the shift is whether it is reactionary in trying to prevent herself from being harmed, anxiety about trying to do things, or that she has spent so much time with this with only negatives to show. She feels embarrassed and robbed of the opportunity to have kids again. Worked on cognitive reframing, identifying automatic thoughts.   She does take the pramipexole  and feels like it really helps her when she uses it. She can get up and do what she needs when she takes it.  Patient can still struggle with consistently taking medication.  Given ongoing symptoms she does have interest in a mood stabilizer however would like to have GeneSight testing first given past negative experiences medications.  Past Psychiatric History: Patient had seen  Dr. Lynnetta in the past and the mood center more recently. She reports that she has been hospitalized multiple times including once in [place] , several times in [place], and twice at [facility].  She has been through the [facility] in 2021 and again in 2023.  Has used Abilify , Seroquel  (weight gain), Geodon, Latuda, Zoloft , Wellbutrin , Remeron , Celexa, trazodone , trintellix , BuSpar , propranolol , gabapentin , Atarax ,  Prazosin , Lamictal (suicidal ideations), lithium (full body weakness), Xanax  (benefit, but memory issues). A lot of these made her too nauseas or too sleepy.  Reports having 2-4 alcoholic drinks a couple times a month. Alsoe endorsed marijuana use in the past. Last in 2008. She denies any other substance use history.   Past Medical History:  Past Medical History:  Diagnosis Date   Alcoholic (HCC)    Anemia    Phreesia 01/17/2020   Anxiety    Asthma    Asthma 06/22/2021   Bipolar 1 disorder (HCC)    Blood transfusion without reported diagnosis    Phreesia 01/17/2020   Chest pain 03/01/2022   03/2021 xr negative.   Chronic headaches    Depression    Phreesia 01/17/2020   Lactose intolerance 03/01/2022   Major depressive disorder    Nausea 03/01/2022   Pregnancy, location unknown 11/21/2021   Formatting of this note might be different from the original. [facility] Quant Book Rainelle Harper Language:  English There is no such number on file (mobile). [phone] (home) [address] [place] [address] No e-mail address on record  [facility] [place] (7th floor [place]): M-F 7:30-18:00 [facility]: M-F 8:00-17:30 [facility]: M-F 8:30-17:00 (cl   Substance abuse (HCC)    Phreesia 01/17/2020    Past Surgical History:  Procedure Laterality Date   CESAREAN SECTION N/A    Phreesia 01/17/2020   COLONOSCOPY     earlier in year 2022   DILATION AND CURETTAGE OF UTERUS     HAND SURGERY Right     WISDOM TOOTH EXTRACTION     Family History:  Family History  Problem Relation Age of Onset   Hypertension Mother    Diabetes Mother    Stroke Mother    Depression Mother    Depression Sister    Irritable bowel syndrome Sister    Asthma Brother    Allergic rhinitis Brother    Migraines Brother    Depression Brother    Stroke Maternal Grandmother    Diabetes Maternal Grandmother    Alcohol abuse Maternal Grandfather    Stroke Paternal Grandmother    Breast cancer Paternal Grandmother    Alcohol abuse Paternal Grandfather    Lupus Half-Sister    Angioedema Neg Hx    Eczema Neg Hx    Immunodeficiency Neg Hx    Atopy Neg Hx     Social History:  Social History   Socioeconomic History   Marital status: Single    Spouse name: Not on file   Number of children: 1   Years of education: Not on file   Highest education level: Not on file  Occupational History   Occupation: academic librarian  Tobacco Use   Smoking status: Former    Current packs/day: 0.00    Types: Cigarettes    Quit date: 09/07/2006    Years since quitting: 17.9   Smokeless tobacco: Never  Vaping Use   Vaping status: Never Used  Substance and Sexual Activity   Alcohol use: Yes    Comment: occ   Drug use: Not Currently   Sexual activity: Yes    Birth control/protection: None  Other Topics Concern   Not on file  Social History Narrative   Right handed   Social Drivers of Health   Tobacco Use: Medium Risk (08/05/2024)   Patient History    Smoking Tobacco Use: Former    Smokeless Tobacco Use: Never    Passive Exposure: Not on file  Financial Resource Strain: Not on file  Food Insecurity: Unknown (03/07/2024)   Received from [facility]   Epic    Within the past 12 months, you worried that your food would run out before you got money to buy more: Patient declined to answer    Within the past 12 months, the food you bought just didn't last and you didn't have money to get more. : Patient declined  to answer  Transportation Needs: Not on file (03/07/2024)  Physical Activity: Not on file  Stress: Not on file  Social Connections: Unknown (11/21/2021)   Received from [facility]   Social Network    Social Network: Not on file  Depression (PHQ2-9): High Risk (08/24/2023)   Depression (PHQ2-9)    PHQ-2 Score: 16  Alcohol Screen: Not on file  Housing: Not on file (03/07/2024)  Utilities: Unknown (03/07/2024)   Received from [facility]   Utilities    In the past  12 months has the electric, gas, oil, or water company threatened to shut off services in your home? : Patient declined to answer  Health Literacy: Not on file    Allergies:  Allergies  Allergen Reactions   Neosporin [Bacitracin-Polymyxin B] Itching   Multiple Vitamins-Iron Swelling    States that vitamins, folic acid  and iron have caused swelling of tongue and gums   Mupirocin Rash   Prednisone Other (See Comments)    Altered mental status    Current Medications: Current Outpatient Medications  Medication Sig Dispense Refill   albuterol  (VENTOLIN  HFA) 108 (90 Base) MCG/ACT inhaler Inhale 1-2 puffs into the lungs every 6 (six) hours as needed for wheezing or shortness of breath. 18 g 0   fluticasone  (FLONASE ) 50 MCG/ACT nasal spray Place 1 spray into both nostrils 2 (two) times daily. 16 g 2   hydrOXYzine  (ATARAX ) 10 MG tablet Take 1 tablet (10 mg total) by mouth daily as needed. 30 tablet 2   pantoprazole  (PROTONIX ) 40 MG tablet TAKE 1 TABLET(40 MG) BY MOUTH DAILY 90 tablet 1   pramipexole  (MIRAPEX ) 0.25 MG tablet TAKE 1 TABLET(0.25 MG) BY MOUTH THREE TIMES DAILY 270 tablet 0   propranolol  (INDERAL ) 20 MG tablet TAKE 1 TABLET(20 MG) BY MOUTH twice a day 180 tablet 0   No current facility-administered medications for this visit.     Psychiatric Specialty Exam: Review of Systems  Last menstrual period 01/13/2024.There is no height or weight on file to calculate BMI.  General Appearance: Fairly Groomed  Eye  Contact:  Good  Speech:  Clear and Coherent  Volume:  Normal  Mood:  Depressed and Irritable  Affect:  Labile  Thought Process:  Coherent  Orientation:  Full (Time, Place, and Person)  Thought Content: Logical   Suicidal Thoughts:  No  Homicidal Thoughts:  No  Memory:  Immediate;   Good  Judgement:  Fair  Insight:  Lacking  Psychomotor Activity:  Normal  Concentration:  Concentration: Fair  Recall:  Fair  Fund of Knowledge: Fair  Language: Good  Akathisia:  NA    AIMS (if indicated): not done  Assets:  Communication Skills Desire for Improvement Housing Talents/Skills Transportation Vocational/Educational  ADL's:  Intact  Cognition: WNL  Sleep:  Fair   Metabolic Disorder Labs: Lab Results  Component Value Date   HGBA1C 5.3 01/19/2020   MPG 105 12/07/2019   No results found for: PROLACTIN Lab Results  Component Value Date   CHOL 189 01/19/2020   TRIG 109 01/19/2020   HDL 47 01/19/2020   CHOLHDL 4.0 01/19/2020   VLDL 9 12/07/2019   LDLCALC 122 (H) 01/19/2020   LDLCALC 141 (H) 12/07/2019   Lab Results  Component Value Date   TSH 1.16 04/12/2021   TSH 0.50 01/20/2021    Therapeutic Level Labs: No results found for: LITHIUM No results found for: VALPROATE No results found for: CBMZ   Screenings: AIMS    Flowsheet Row [facility]) [facility]  AIMS Total Score 0   AUDIT    Flowsheet Row Admission (Discharged) from [facility]  Alcohol Use Disorder Identification Test Final Score (AUDIT) 1   GAD-7    Flowsheet Row Counselor from 08/24/2023 in [place] [facility] at [facility] Visit from 12/30/2020 in [facility] Visit from 01/19/2020 in Primary Care at [facility]  Total  GAD-7 Score 18 16 10    PHQ2-9    Flowsheet Row Counselor from  08/24/2023 in [facility] at [facility] from 08/17/2022 in [facility] at [facility] Visit from 03/01/2022 in [facility] [place] from 08/01/2021 in BEHAVIORAL HEALTH INTENSIVE PSYCH Office Visit from 04/12/2021 in [facility] Total Score 4 2 0 4 1  PHQ-9 Total Score 16 13 -- 20 8   Flowsheet Row UC from 05/20/2024 in [facility] at [place] UC from 02/20/2024 in [facility] at [place] UC from 11/26/2023 in [facility] at [facility] RISK CATEGORY No Risk No Risk No Risk    Collaboration of Care: Collaboration of Care: Medication Management AEB medication prescription, Other provider involved in patient's care AEB urgent care and ED chart review, and Referral or follow-up with counselor/therapist AEB chart review  Patient/Guardian was advised Release of Information must be obtained prior to any record release in order to collaborate their care with an outside provider. Patient/Guardian was advised if they have not already done so to contact the registration department to sign all necessary forms in order for us  to release information regarding their care.   Consent: Patient/Guardian gives verbal consent for treatment and assignment of benefits for services provided during this visit. Patient/Guardian expressed understanding and agreed to proceed.    Arvella CHRISTELLA Finder, MD 08/05/2024, 2:04 PM   Virtual Visit via Video Note  I connected with Rainelle Harper on 08/05/2024 at  1:00 PM EST by a video enabled telemedicine application and verified that I am speaking with the correct person using two identifiers.  Location: Patient: Home Provider: Home Office   I discussed the limitations of evaluation and management by telemedicine and the availability of in person appointments. The patient expressed  understanding and agreed to proceed.   I discussed the assessment and treatment plan with the patient. The patient was provided an opportunity to ask questions and all were answered. The patient agreed with the plan and demonstrated an understanding of the instructions.   The patient was advised to call back or seek an in-person evaluation if the symptoms worsen or if the condition fails to improve as anticipated.  I provided 30 minutes of non-face-to-face time during this encounter.   Arvella CHRISTELLA Finder, MD

## 2024-08-05 ENCOUNTER — Encounter (HOSPITAL_COMMUNITY): Payer: Self-pay | Admitting: Psychiatry

## 2024-08-05 ENCOUNTER — Telehealth (HOSPITAL_COMMUNITY): Admitting: Psychiatry

## 2024-08-05 DIAGNOSIS — F410 Panic disorder [episodic paroxysmal anxiety] without agoraphobia: Secondary | ICD-10-CM

## 2024-08-05 DIAGNOSIS — F411 Generalized anxiety disorder: Secondary | ICD-10-CM | POA: Diagnosis not present

## 2024-08-05 MED ORDER — PROPRANOLOL HCL 20 MG PO TABS: ORAL_TABLET | ORAL | 0 refills | Status: AC

## 2024-08-05 MED ORDER — HYDROXYZINE HCL 10 MG PO TABS: 10.0000 mg | ORAL_TABLET | Freq: Every day | ORAL | 2 refills | Status: AC | PRN

## 2024-08-07 ENCOUNTER — Telehealth (HOSPITAL_COMMUNITY): Admitting: Psychiatry

## 2024-08-08 ENCOUNTER — Ambulatory Visit (HOSPITAL_COMMUNITY): Admitting: Clinical

## 2024-08-08 ENCOUNTER — Encounter (HOSPITAL_COMMUNITY): Payer: Self-pay

## 2024-08-08 NOTE — Progress Notes (Signed)
 Zoua [name] Coe    CSW attempted to connect with patient for scheduled appointment via MyChart video text request x 2 and email request with no response; also attempted to connect via phone without success. CSW left message for patient to call office to reschedule therapy appointment.        Attempt 1: Text and email: 11:04am      Attempt 2: Text: 11:09am       Attempt 3: Phone call and attempted voicemail (full): 11:15am      Left video chat open until:  11:17am      Per [facility] policy, after multiple attempts to reach patient unsuccessfully at appointed time, visit will be coded as a no show.    Encounter Diagnosis  Name Primary?   No-show for appointment Yes       Elgie Crest, LCSW 08/08/2024, 11:18 AM

## 2024-08-11 ENCOUNTER — Ambulatory Visit

## 2024-08-12 ENCOUNTER — Ambulatory Visit
Admission: RE | Admit: 2024-08-12 | Discharge: 2024-08-12 | Disposition: A | Discharge status: Home / Self Care | Attending: Internal Medicine | Admitting: Internal Medicine

## 2024-08-12 VITALS — BP 127/81 | HR 86 | Temp 98.8°F | Resp 17

## 2024-08-12 DIAGNOSIS — L089 Local infection of the skin and subcutaneous tissue, unspecified: Secondary | ICD-10-CM | POA: Diagnosis not present

## 2024-08-12 DIAGNOSIS — L910 Hypertrophic scar: Secondary | ICD-10-CM

## 2024-08-12 MED ORDER — FLUCONAZOLE 150 MG PO TABS: 150.0000 mg | ORAL_TABLET | Freq: Every day | ORAL | 0 refills | Status: AC

## 2024-08-12 MED ORDER — DOXYCYCLINE HYCLATE 100 MG PO CAPS: 100.0000 mg | ORAL_CAPSULE | Freq: Two times a day (BID) | ORAL | 0 refills | Status: AC

## 2024-08-12 MED ORDER — TRIAMCINOLONE ACETONIDE 0.025 % EX OINT: 1.0000 | TOPICAL_OINTMENT | Freq: Two times a day (BID) | CUTANEOUS | 0 refills | Status: AC

## 2024-08-12 NOTE — ED Triage Notes (Addendum)
 Pt c/o possible arm infection from self inflicted cuts that she states occurred Jan 2nd. Itchy and painful. States was seen for original injuries and tx with abx but does not feel like it helped.

## 2024-08-12 NOTE — Discharge Instructions (Addendum)
 Symptoms and physical exam findings are consistent with infection and inflammation of healing lacerations of the left arm.  There is also a significant amount of keloid formation in the area as well that is likely causing the itching and pain.  We will treat with antibiotics by mouth as well as topical steroids.  We recommend the following: Doxycycline  100 mg twice daily for 5 days. Take this with food.  Avoid prolonged exposure to the sun while you are on this antibiotic as it can make you more sensitive to the sun. Triamcinolone  ointment twice daily to the affected area as needed for itching/rash.  Do not apply this to the neck or face.  Diflucan  150 mg take 1 tablet on day 2 of antibiotics and then repeat in 3 days.  This is to help with yeast infection Make sure to stay hydrated by drinking plenty of water.  May want to consider following up with dermatology for the keloids on the arm as there may be treatment available to reduce the itching. Return to urgent care or PCP if symptoms worsen or fail to resolve.

## 2024-08-12 NOTE — ED Provider Notes (Signed)
 " [name]    CSN: [id number] Arrival date & time: 08/12/24  1748      History   Chief Complaint Chief Complaint  Patient presents with   Rash    APPT 6PM    HPI Guy Briar Sword is a 39 y.o. female.   39 year old female who presents urgent care with complaints of itching, redness and pain on scratches on her left arm.  She reports that these happened a several weeks ago.  She was seen and treated with antibiotics by mouth but she reports that she never actually took the antibiotics consistently and does not think the infection was completely resolved.  She denies any fevers, chills, nausea, vomiting.  She also relates that she does have areas that itch on her upper arm from previous keloid areas.   Rash Associated symptoms: no abdominal pain, no fever, no joint pain, no shortness of breath, no sore throat and not vomiting     Past Medical History:  Diagnosis Date   Alcoholic (HCC)    Anemia    Phreesia 01/17/2020   Anxiety    Asthma    Asthma 06/22/2021   Bipolar 1 disorder (HCC)    Blood transfusion without reported diagnosis    Phreesia 01/17/2020   Chest pain 03/01/2022   03/2021 xr negative.   Chronic headaches    Depression    Phreesia 01/17/2020   Lactose intolerance 03/01/2022   Major depressive disorder    Nausea 03/01/2022   Pregnancy, location unknown 11/21/2021   Formatting of this note might be different from the original. [facility] Quant Book [place] Language: English There is no such number on file (mobile). [phone] (home) [address] [place] [address] No e-mail address on record  [facility] (7th floor [place], [address]): M-F 7:30-18:00 [facility]: M-F 8:00-17:30 [facility]: M-F 8:30-17:00 (cl   Substance abuse (HCC)    Phreesia 01/17/2020    Patient Active Problem List   Diagnosis Date Noted   Chest injuries 07/20/2022   Vaginal pain 06/13/2022   Abnormal uterine bleeding  (AUB) 03/13/2022   Nausea 03/01/2022   Chest pain 03/01/2022   Lactose intolerance 03/01/2022   Rash 03/01/2022   Epigastric pain 02/08/2022   Other allergic rhinitis 06/22/2021   Heartburn 06/22/2021   Panic disorder 04/12/2020   GAD (generalized anxiety disorder) 03/18/2020   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   Borderline personality disorder (HCC) 02/06/2020   Bipolar 2 disorder, major depressive episode (HCC) 12/06/2019    Past Surgical History:  Procedure Laterality Date   CESAREAN SECTION N/A    Phreesia 01/17/2020   COLONOSCOPY     earlier in year 2022   DILATION AND CURETTAGE OF UTERUS     HAND SURGERY Right    WISDOM TOOTH EXTRACTION      OB History     Gravida  5   Para  1   Term      Preterm  1   AB  3   Living  1      SAB  3   IAB      Ectopic      Multiple      Live Births  1        Obstetric Comments  LMP 01/13/2024          Home Medications    Prior to Admission medications  Medication Sig Start Date End Date Taking? Authorizing Provider  doxycycline  (VIBRAMYCIN ) 100 MG capsule Take 1 capsule (100 mg total) by mouth 2 (two) times daily for 5 days. 08/12/24 08/17/24 Yes Johnisha Louks A, PA-C  fluconazole  (DIFLUCAN ) 150 MG tablet Take 1 tablet (150 mg total) by mouth daily for 2 days. take 1 tablet on day 2 of antibiotics and then repeat in 3 days 08/12/24 08/14/24 Yes Aveion Nguyen A, PA-C  triamcinolone  (KENALOG ) 0.025 % ointment Apply 1 Application topically 2 (two) times daily. 08/12/24  Yes Sonnie Pawloski A, PA-C  albuterol  (VENTOLIN  HFA) 108 (90 Base) MCG/ACT inhaler Inhale 1-2 puffs into the lungs every 6 (six) hours as needed for wheezing or shortness of breath. 06/08/22   Christopher Savannah, PA-C  fluticasone  (FLONASE ) 50 MCG/ACT nasal spray Place 1 spray into both nostrils 2 (two) times daily. 04/03/23   Stuart Vernell Norris, PA-C  hydrOXYzine  (ATARAX ) 10 MG tablet Take 1 tablet (10 mg total) by mouth daily as needed. 08/05/24    Carvin Arvella HERO, MD  pantoprazole  (PROTONIX ) 40 MG tablet TAKE 1 TABLET(40 MG) BY MOUTH DAILY 03/20/24   Jesus Bernardino MATSU, MD  pramipexole  (MIRAPEX ) 0.25 MG tablet TAKE 1 TABLET(0.25 MG) BY MOUTH THREE TIMES DAILY 06/12/24   Carvin Arvella HERO, MD  propranolol  (INDERAL ) 20 MG tablet TAKE 1 TABLET(20 MG) BY MOUTH twice a day 08/05/24   Carvin Arvella HERO, MD    Family History Family History  Problem Relation Age of Onset   Hypertension Mother    Diabetes Mother    Stroke Mother    Depression Mother    Depression Sister    Irritable bowel syndrome Sister    Asthma Brother    Allergic rhinitis Brother    Migraines Brother    Depression Brother    Stroke Maternal Grandmother    Diabetes Maternal Grandmother    Alcohol abuse Maternal Grandfather    Stroke Paternal Grandmother    Breast cancer Paternal Grandmother    Alcohol abuse Paternal Grandfather    Lupus Half-Sister    Angioedema Neg Hx    Eczema Neg Hx    Immunodeficiency Neg Hx    Atopy Neg Hx     Social History Social History[1]   Allergies   Neosporin [bacitracin-polymyxin b], Multiple vitamins-iron, Mupirocin, and Prednisone   Review of Systems Review of Systems  Constitutional:  Negative for chills and fever.  HENT:  Negative for ear pain and sore throat.   Eyes:  Negative for pain and visual disturbance.  Respiratory:  Negative for cough and shortness of breath.   Cardiovascular:  Negative for chest pain and palpitations.  Gastrointestinal:  Negative for abdominal pain and vomiting.  Genitourinary:  Negative for dysuria and hematuria.  Musculoskeletal:  Negative for arthralgias and back pain.  Skin:  Positive for rash. Negative for color change.       Redness, itching and pain left arm  Neurological:  Negative for seizures and syncope.  All other systems reviewed and are negative.    Physical Exam Triage Vital Signs ED Triage Vitals  Encounter Vitals Group     BP 08/12/24 1816 127/81     Girls Systolic BP  Percentile --      Girls Diastolic BP Percentile --      Boys Systolic BP Percentile --      Boys Diastolic BP Percentile --      Pulse Rate 08/12/24 1816 86     Resp 08/12/24 1816 17     Temp 08/12/24 1816 98.8 F (37.1 C)  Temp Source 08/12/24 1816 Oral     SpO2 08/12/24 1816 99 %     Weight --      Height --      Head Circumference --      Peak Flow --      Pain Score 08/12/24 1817 6     Pain Loc --      Pain Education --      Exclude from Growth Chart --    No data found.  Updated Vital Signs BP 127/81 (BP Location: Right Arm)   Pulse 86   Temp 98.8 F (37.1 C) (Oral)   Resp 17   LMP 07/27/2024 (Approximate)   SpO2 99%   Visual Acuity Right Eye Distance:   Left Eye Distance:   Bilateral Distance:    Right Eye Near:   Left Eye Near:    Bilateral Near:     Physical Exam Vitals and nursing note reviewed.  Constitutional:      General: She is not in acute distress.    Appearance: She is well-developed.  HENT:     Head: Normocephalic and atraumatic.  Eyes:     Conjunctiva/sclera: Conjunctivae normal.  Cardiovascular:     Rate and Rhythm: Normal rate and regular rhythm.     Heart sounds: No murmur heard. Pulmonary:     Effort: Pulmonary effort is normal. No respiratory distress.  Musculoskeletal:        General: No swelling.  Skin:    General: Skin is warm and dry.     Capillary Refill: Capillary refill takes less than 2 seconds.      Neurological:     Mental Status: She is alert.  Psychiatric:        Mood and Affect: Mood normal.      UC Treatments / Results  Labs (all labs ordered are listed, but only abnormal results are displayed) Labs Reviewed - No data to display  EKG   Radiology No results found.  Procedures Procedures (including critical care time)  Medications Ordered in UC Medications - No data to display  Initial Impression / Assessment and Plan / UC Course  I have reviewed the triage vital signs and the nursing  notes.  Pertinent labs & imaging results that were available during my care of the patient were reviewed by me and considered in my medical decision making (see chart for details).     Skin infection  Keloid   Symptoms and physical exam findings are consistent with infection and inflammation of healing lacerations of the left arm.  There is also a significant amount of keloid formation in the area as well that is likely causing the itching and pain.  We will treat with antibiotics by mouth as well as topical steroids.  We recommend the following: Doxycycline  100 mg twice daily for 5 days. Take this with food.  Avoid prolonged exposure to the sun while you are on this antibiotic as it can make you more sensitive to the sun. Triamcinolone  ointment twice daily to the affected area as needed for itching/rash.  Do not apply this to the neck or face.  Diflucan  150 mg take 1 tablet on day 2 of antibiotics and then repeat in 3 days.  This is to help with yeast infection Make sure to stay hydrated by drinking plenty of water.  May want to consider following up with dermatology for the keloids on the arm as there may be treatment available to reduce the itching. Return to  urgent care or PCP if symptoms worsen or fail to resolve.    Final Clinical Impressions(s) / UC Diagnoses   Final diagnoses:  Skin infection  Keloid     Discharge Instructions      Symptoms and physical exam findings are consistent with infection and inflammation of healing lacerations of the left arm.  There is also a significant amount of keloid formation in the area as well that is likely causing the itching and pain.  We will treat with antibiotics by mouth as well as topical steroids.  We recommend the following: Doxycycline  100 mg twice daily for 5 days. Take this with food.  Avoid prolonged exposure to the sun while you are on this antibiotic as it can make you more sensitive to the sun. Triamcinolone  ointment twice  daily to the affected area as needed for itching/rash.  Do not apply this to the neck or face.  Diflucan  150 mg take 1 tablet on day 2 of antibiotics and then repeat in 3 days.  This is to help with yeast infection Make sure to stay hydrated by drinking plenty of water.  May want to consider following up with dermatology for the keloids on the arm as there may be treatment available to reduce the itching. Return to urgent care or PCP if symptoms worsen or fail to resolve.      ED Prescriptions     Medication Sig Dispense Auth. Provider   triamcinolone  (KENALOG ) 0.025 % ointment Apply 1 Application topically 2 (two) times daily. 454 g Teresa Norris A, PA-C   doxycycline  (VIBRAMYCIN ) 100 MG capsule Take 1 capsule (100 mg total) by mouth 2 (two) times daily for 5 days. 10 capsule Teresa Norris A, PA-C   fluconazole  (DIFLUCAN ) 150 MG tablet Take 1 tablet (150 mg total) by mouth daily for 2 days. take 1 tablet on day 2 of antibiotics and then repeat in 3 days 2 tablet Teresa Norris LABOR, PA-C      PDMP not reviewed this encounter.    [1]  Social History Tobacco Use   Smoking status: Former    Current packs/day: 0.00    Types: Cigarettes    Quit date: 09/07/2006    Years since quitting: 17.9   Smokeless tobacco: Never  Vaping Use   Vaping status: Never Used  Substance Use Topics   Alcohol use: Yes    Comment: occ   Drug use: Not Currently     Teresa Norris LABOR, PA-C 08/12/24 1847  "

## 2024-08-13 ENCOUNTER — Telehealth: Payer: Self-pay | Admitting: Professional Counselor

## 2024-08-13 NOTE — Telephone Encounter (Signed)
 Attempted to contact pt about DBT skills group referral. Received VM and left HIPAA compliant message.

## 2024-08-27 ENCOUNTER — Telehealth (HOSPITAL_COMMUNITY): Admitting: Psychiatry

## 2024-08-28 ENCOUNTER — Ambulatory Visit (HOSPITAL_COMMUNITY): Admitting: Clinical

## 2024-09-11 ENCOUNTER — Ambulatory Visit (HOSPITAL_COMMUNITY): Admitting: Clinical

## 2024-09-25 ENCOUNTER — Ambulatory Visit (HOSPITAL_COMMUNITY): Admitting: Clinical

## 2024-10-09 ENCOUNTER — Ambulatory Visit (HOSPITAL_COMMUNITY): Admitting: Clinical

## 2024-10-20 ENCOUNTER — Telehealth (HOSPITAL_COMMUNITY): Admitting: Psychiatry

## 2024-10-23 ENCOUNTER — Ambulatory Visit (HOSPITAL_COMMUNITY): Admitting: Clinical
